# Patient Record
Sex: Male | Born: 1950 | Race: White | Hispanic: No | Smoking: Current every day smoker
Health system: Southern US, Community
[De-identification: ages and names within clinical notes are randomized; demographics above are authoritative.]

## PROBLEM LIST (undated history)

## (undated) DIAGNOSIS — K2100 Gastro-esophageal reflux disease with esophagitis, without bleeding: Secondary | ICD-10-CM

## (undated) DIAGNOSIS — G9389 Other specified disorders of brain: Secondary | ICD-10-CM

## (undated) DIAGNOSIS — K5090 Crohn's disease, unspecified, without complications: Secondary | ICD-10-CM

## (undated) DIAGNOSIS — R079 Chest pain, unspecified: Secondary | ICD-10-CM

## (undated) DIAGNOSIS — Z8719 Personal history of other diseases of the digestive system: Secondary | ICD-10-CM

## (undated) DIAGNOSIS — M51369 Other intervertebral disc degeneration, lumbar region without mention of lumbar back pain or lower extremity pain: Secondary | ICD-10-CM

## (undated) DIAGNOSIS — K3189 Other diseases of stomach and duodenum: Secondary | ICD-10-CM

## (undated) DIAGNOSIS — M549 Dorsalgia, unspecified: Secondary | ICD-10-CM

## (undated) DIAGNOSIS — G4733 Obstructive sleep apnea (adult) (pediatric): Secondary | ICD-10-CM

## (undated) DIAGNOSIS — R0602 Shortness of breath: Secondary | ICD-10-CM

## (undated) DIAGNOSIS — M5030 Other cervical disc degeneration, unspecified cervical region: Secondary | ICD-10-CM

## (undated) DIAGNOSIS — K7010 Alcoholic hepatitis without ascites: Secondary | ICD-10-CM

## (undated) DIAGNOSIS — E569 Vitamin deficiency, unspecified: Secondary | ICD-10-CM

## (undated) DIAGNOSIS — F1020 Alcohol dependence, uncomplicated: Secondary | ICD-10-CM

## (undated) DIAGNOSIS — Z87442 Personal history of urinary calculi: Secondary | ICD-10-CM

## (undated) DIAGNOSIS — G43909 Migraine, unspecified, not intractable, without status migrainosus: Secondary | ICD-10-CM

## (undated) DIAGNOSIS — K449 Diaphragmatic hernia without obstruction or gangrene: Secondary | ICD-10-CM

## (undated) DIAGNOSIS — K766 Portal hypertension: Secondary | ICD-10-CM

## (undated) DIAGNOSIS — S065XAA Traumatic subdural hemorrhage with loss of consciousness status unknown, initial encounter: Secondary | ICD-10-CM

## (undated) DIAGNOSIS — K219 Gastro-esophageal reflux disease without esophagitis: Secondary | ICD-10-CM

## (undated) DIAGNOSIS — M5136 Other intervertebral disc degeneration, lumbar region: Secondary | ICD-10-CM

## (undated) DIAGNOSIS — K7030 Alcoholic cirrhosis of liver without ascites: Secondary | ICD-10-CM

## (undated) DIAGNOSIS — K529 Noninfective gastroenteritis and colitis, unspecified: Secondary | ICD-10-CM

## (undated) DIAGNOSIS — I639 Cerebral infarction, unspecified: Secondary | ICD-10-CM

## (undated) DIAGNOSIS — R011 Cardiac murmur, unspecified: Secondary | ICD-10-CM

## (undated) DIAGNOSIS — K911 Postgastric surgery syndromes: Secondary | ICD-10-CM

## (undated) DIAGNOSIS — S065X9A Traumatic subdural hemorrhage with loss of consciousness of unspecified duration, initial encounter: Secondary | ICD-10-CM

## (undated) DIAGNOSIS — K909 Intestinal malabsorption, unspecified: Secondary | ICD-10-CM

## (undated) DIAGNOSIS — G8929 Other chronic pain: Secondary | ICD-10-CM

## (undated) DIAGNOSIS — F32A Depression, unspecified: Secondary | ICD-10-CM

## (undated) DIAGNOSIS — K3184 Gastroparesis: Secondary | ICD-10-CM

## (undated) DIAGNOSIS — J690 Pneumonitis due to inhalation of food and vomit: Secondary | ICD-10-CM

## (undated) DIAGNOSIS — F329 Major depressive disorder, single episode, unspecified: Secondary | ICD-10-CM

## (undated) DIAGNOSIS — M1990 Unspecified osteoarthritis, unspecified site: Secondary | ICD-10-CM

## (undated) DIAGNOSIS — G459 Transient cerebral ischemic attack, unspecified: Secondary | ICD-10-CM

## (undated) DIAGNOSIS — K760 Fatty (change of) liver, not elsewhere classified: Secondary | ICD-10-CM

## (undated) DIAGNOSIS — K852 Alcohol induced acute pancreatitis without necrosis or infection: Secondary | ICD-10-CM

## (undated) DIAGNOSIS — E291 Testicular hypofunction: Secondary | ICD-10-CM

## (undated) DIAGNOSIS — D509 Iron deficiency anemia, unspecified: Secondary | ICD-10-CM

## (undated) DIAGNOSIS — F419 Anxiety disorder, unspecified: Secondary | ICD-10-CM

## (undated) DIAGNOSIS — I10 Essential (primary) hypertension: Secondary | ICD-10-CM

## (undated) HISTORY — DX: Alcohol dependence, uncomplicated: F10.20

## (undated) HISTORY — DX: Intestinal malabsorption, unspecified: K90.9

## (undated) HISTORY — DX: Gastro-esophageal reflux disease with esophagitis: K21.0

## (undated) HISTORY — DX: Other intervertebral disc degeneration, lumbar region: M51.36

## (undated) HISTORY — PX: BILROTH II PROCEDURE: SHX1232

## (undated) HISTORY — DX: Major depressive disorder, single episode, unspecified: F32.9

## (undated) HISTORY — DX: Postgastric surgery syndromes: K91.1

## (undated) HISTORY — DX: Personal history of other diseases of the digestive system: Z87.19

## (undated) HISTORY — DX: Alcoholic cirrhosis of liver without ascites: K70.30

## (undated) HISTORY — DX: Alcoholic hepatitis without ascites: K70.10

## (undated) HISTORY — DX: Other cervical disc degeneration, unspecified cervical region: M50.30

## (undated) HISTORY — DX: Depression, unspecified: F32.A

## (undated) HISTORY — DX: Other specified disorders of brain: G93.89

## (undated) HISTORY — DX: Iron deficiency anemia, unspecified: D50.9

## (undated) HISTORY — DX: Other intervertebral disc degeneration, lumbar region without mention of lumbar back pain or lower extremity pain: M51.369

## (undated) HISTORY — DX: Gastro-esophageal reflux disease without esophagitis: K21.9

## (undated) HISTORY — DX: Portal hypertension: K76.6

## (undated) HISTORY — PX: TRANSTHORACIC ECHOCARDIOGRAM: SHX275

## (undated) HISTORY — DX: Gastro-esophageal reflux disease with esophagitis, without bleeding: K21.00

## (undated) HISTORY — DX: Cardiac murmur, unspecified: R01.1

## (undated) HISTORY — DX: Pneumonitis due to inhalation of food and vomit: J69.0

## (undated) HISTORY — DX: Fatty (change of) liver, not elsewhere classified: K76.0

## (undated) HISTORY — DX: Anxiety disorder, unspecified: F41.9

## (undated) HISTORY — DX: Alcohol induced acute pancreatitis without necrosis or infection: K85.20

## (undated) HISTORY — PX: CARDIOVASCULAR STRESS TEST: SHX262

## (undated) HISTORY — DX: Other diseases of stomach and duodenum: K31.89

## (undated) HISTORY — DX: Testicular hypofunction: E29.1

## (undated) HISTORY — DX: Vitamin deficiency, unspecified: E56.9

## (undated) HISTORY — PX: COLON RESECTION: SHX5231

## (undated) HISTORY — PX: NISSEN FUNDOPLICATION: SHX2091

## (undated) HISTORY — DX: Chest pain, unspecified: R07.9

## (undated) HISTORY — DX: Obstructive sleep apnea (adult) (pediatric): G47.33

---

## 2000-12-16 ENCOUNTER — Emergency Department (HOSPITAL_COMMUNITY): Admission: EM | Admit: 2000-12-16 | Discharge: 2000-12-16 | Payer: Self-pay | Admitting: Emergency Medicine

## 2000-12-18 ENCOUNTER — Encounter: Payer: Self-pay | Admitting: Pediatrics

## 2000-12-18 ENCOUNTER — Ambulatory Visit (HOSPITAL_COMMUNITY): Admission: RE | Admit: 2000-12-18 | Discharge: 2000-12-18 | Payer: Self-pay | Admitting: Pediatrics

## 2003-07-25 ENCOUNTER — Emergency Department (HOSPITAL_COMMUNITY): Admission: EM | Admit: 2003-07-25 | Discharge: 2003-07-25 | Payer: Self-pay | Admitting: Emergency Medicine

## 2003-07-28 ENCOUNTER — Ambulatory Visit (HOSPITAL_COMMUNITY): Admission: RE | Admit: 2003-07-28 | Discharge: 2003-07-28 | Payer: Self-pay | Admitting: Family Medicine

## 2003-07-29 ENCOUNTER — Observation Stay (HOSPITAL_COMMUNITY): Admission: AD | Admit: 2003-07-29 | Discharge: 2003-07-30 | Payer: Self-pay | Admitting: Family Medicine

## 2003-08-26 ENCOUNTER — Ambulatory Visit (HOSPITAL_COMMUNITY): Admission: RE | Admit: 2003-08-26 | Discharge: 2003-08-26 | Payer: Self-pay | Admitting: Internal Medicine

## 2004-09-06 ENCOUNTER — Ambulatory Visit (HOSPITAL_COMMUNITY): Admission: RE | Admit: 2004-09-06 | Discharge: 2004-09-06 | Payer: Self-pay | Admitting: Family Medicine

## 2005-01-26 ENCOUNTER — Ambulatory Visit (HOSPITAL_COMMUNITY): Admission: RE | Admit: 2005-01-26 | Discharge: 2005-01-26 | Payer: Self-pay | Admitting: Pediatrics

## 2005-02-03 ENCOUNTER — Ambulatory Visit (HOSPITAL_COMMUNITY): Admission: RE | Admit: 2005-02-03 | Discharge: 2005-02-03 | Payer: Self-pay | Admitting: Pediatrics

## 2005-04-13 ENCOUNTER — Ambulatory Visit: Payer: Self-pay | Admitting: Internal Medicine

## 2005-04-24 ENCOUNTER — Encounter (INDEPENDENT_AMBULATORY_CARE_PROVIDER_SITE_OTHER): Payer: Self-pay | Admitting: Internal Medicine

## 2005-04-25 ENCOUNTER — Ambulatory Visit (HOSPITAL_COMMUNITY): Admission: RE | Admit: 2005-04-25 | Discharge: 2005-04-25 | Payer: Self-pay | Admitting: Internal Medicine

## 2005-04-25 ENCOUNTER — Ambulatory Visit: Payer: Self-pay | Admitting: Internal Medicine

## 2005-06-22 ENCOUNTER — Encounter: Admission: RE | Admit: 2005-06-22 | Discharge: 2005-07-07 | Payer: Self-pay | Admitting: Family Medicine

## 2005-07-13 ENCOUNTER — Encounter: Admission: RE | Admit: 2005-07-13 | Discharge: 2005-10-11 | Payer: Self-pay | Admitting: Family Medicine

## 2005-09-19 ENCOUNTER — Inpatient Hospital Stay (HOSPITAL_COMMUNITY): Admission: EM | Admit: 2005-09-19 | Discharge: 2005-09-24 | Payer: Self-pay | Admitting: Emergency Medicine

## 2005-09-20 ENCOUNTER — Ambulatory Visit: Payer: Self-pay | Admitting: Internal Medicine

## 2005-10-12 ENCOUNTER — Ambulatory Visit (HOSPITAL_COMMUNITY): Admission: RE | Admit: 2005-10-12 | Discharge: 2005-10-12 | Payer: Self-pay | Admitting: Pediatrics

## 2005-10-12 ENCOUNTER — Ambulatory Visit: Payer: Self-pay | Admitting: Cardiology

## 2005-11-22 ENCOUNTER — Ambulatory Visit: Payer: Self-pay | Admitting: Internal Medicine

## 2006-02-19 ENCOUNTER — Ambulatory Visit (HOSPITAL_COMMUNITY): Admission: RE | Admit: 2006-02-19 | Discharge: 2006-02-20 | Payer: Self-pay | Admitting: Family Medicine

## 2006-02-21 ENCOUNTER — Ambulatory Visit: Payer: Self-pay | Admitting: *Deleted

## 2006-02-23 ENCOUNTER — Ambulatory Visit (HOSPITAL_COMMUNITY): Admission: RE | Admit: 2006-02-23 | Discharge: 2006-02-23 | Payer: Self-pay | Admitting: Family Medicine

## 2006-04-29 ENCOUNTER — Ambulatory Visit: Payer: Self-pay | Admitting: Psychiatry

## 2006-04-29 ENCOUNTER — Emergency Department (HOSPITAL_COMMUNITY): Admission: EM | Admit: 2006-04-29 | Discharge: 2006-04-30 | Payer: Self-pay | Admitting: Emergency Medicine

## 2006-04-30 ENCOUNTER — Inpatient Hospital Stay (HOSPITAL_COMMUNITY): Admission: EM | Admit: 2006-04-30 | Discharge: 2006-05-04 | Payer: Self-pay | Admitting: Psychiatry

## 2006-10-08 ENCOUNTER — Ambulatory Visit (HOSPITAL_COMMUNITY): Admission: RE | Admit: 2006-10-08 | Discharge: 2006-10-08 | Payer: Self-pay | Admitting: Family Medicine

## 2007-05-15 ENCOUNTER — Ambulatory Visit: Payer: Self-pay | Admitting: Critical Care Medicine

## 2007-05-20 ENCOUNTER — Ambulatory Visit: Admission: RE | Admit: 2007-05-20 | Discharge: 2007-05-20 | Payer: Self-pay | Admitting: Critical Care Medicine

## 2007-05-20 ENCOUNTER — Ambulatory Visit: Payer: Self-pay | Admitting: Critical Care Medicine

## 2007-05-20 ENCOUNTER — Encounter: Payer: Self-pay | Admitting: Critical Care Medicine

## 2007-06-07 ENCOUNTER — Ambulatory Visit: Payer: Self-pay | Admitting: Critical Care Medicine

## 2007-06-07 DIAGNOSIS — Z8719 Personal history of other diseases of the digestive system: Secondary | ICD-10-CM | POA: Insufficient documentation

## 2007-06-07 DIAGNOSIS — J441 Chronic obstructive pulmonary disease with (acute) exacerbation: Secondary | ICD-10-CM

## 2007-06-07 DIAGNOSIS — I6350 Cerebral infarction due to unspecified occlusion or stenosis of unspecified cerebral artery: Secondary | ICD-10-CM | POA: Insufficient documentation

## 2007-06-07 DIAGNOSIS — J690 Pneumonitis due to inhalation of food and vomit: Secondary | ICD-10-CM

## 2007-06-07 DIAGNOSIS — Z9889 Other specified postprocedural states: Secondary | ICD-10-CM

## 2007-06-07 DIAGNOSIS — K219 Gastro-esophageal reflux disease without esophagitis: Secondary | ICD-10-CM

## 2007-06-07 DIAGNOSIS — F411 Generalized anxiety disorder: Secondary | ICD-10-CM | POA: Insufficient documentation

## 2007-06-11 ENCOUNTER — Ambulatory Visit: Payer: Self-pay | Admitting: Gastroenterology

## 2007-06-18 ENCOUNTER — Ambulatory Visit (HOSPITAL_COMMUNITY): Admission: RE | Admit: 2007-06-18 | Discharge: 2007-06-18 | Payer: Self-pay | Admitting: Gastroenterology

## 2007-06-24 ENCOUNTER — Encounter: Payer: Self-pay | Admitting: Gastroenterology

## 2007-06-24 ENCOUNTER — Ambulatory Visit: Payer: Self-pay | Admitting: Gastroenterology

## 2007-07-04 ENCOUNTER — Ambulatory Visit (HOSPITAL_COMMUNITY): Admission: RE | Admit: 2007-07-04 | Discharge: 2007-07-04 | Payer: Self-pay | Admitting: Gastroenterology

## 2007-07-04 ENCOUNTER — Encounter: Payer: Self-pay | Admitting: Gastroenterology

## 2007-07-29 ENCOUNTER — Ambulatory Visit: Payer: Self-pay | Admitting: Gastroenterology

## 2007-08-06 ENCOUNTER — Ambulatory Visit: Payer: Self-pay | Admitting: Cardiology

## 2007-08-07 ENCOUNTER — Ambulatory Visit (HOSPITAL_COMMUNITY): Admission: RE | Admit: 2007-08-07 | Discharge: 2007-08-07 | Payer: Self-pay | Admitting: Surgery

## 2007-08-13 ENCOUNTER — Ambulatory Visit (HOSPITAL_COMMUNITY): Admission: RE | Admit: 2007-08-13 | Discharge: 2007-08-13 | Payer: Self-pay | Admitting: Gastroenterology

## 2007-08-29 HISTORY — PX: PYLOROPLASTY: SHX418

## 2007-09-29 HISTORY — PX: HIATAL HERNIA REPAIR: SHX195

## 2007-10-24 ENCOUNTER — Inpatient Hospital Stay (HOSPITAL_COMMUNITY): Admission: AD | Admit: 2007-10-24 | Discharge: 2007-12-12 | Payer: Self-pay | Admitting: Surgery

## 2007-12-23 ENCOUNTER — Inpatient Hospital Stay (HOSPITAL_COMMUNITY): Admission: EM | Admit: 2007-12-23 | Discharge: 2008-01-07 | Payer: Self-pay | Admitting: Emergency Medicine

## 2007-12-26 ENCOUNTER — Ambulatory Visit: Payer: Self-pay | Admitting: Internal Medicine

## 2008-01-25 ENCOUNTER — Emergency Department (HOSPITAL_COMMUNITY): Admission: EM | Admit: 2008-01-25 | Discharge: 2008-01-25 | Payer: Self-pay | Admitting: Emergency Medicine

## 2008-01-30 ENCOUNTER — Encounter: Payer: Self-pay | Admitting: Internal Medicine

## 2008-01-30 ENCOUNTER — Encounter: Payer: Self-pay | Admitting: Gastroenterology

## 2008-09-03 ENCOUNTER — Ambulatory Visit (HOSPITAL_COMMUNITY): Admission: RE | Admit: 2008-09-03 | Discharge: 2008-09-03 | Payer: Self-pay | Admitting: Family Medicine

## 2009-01-20 IMAGING — CT CT ABCESS DRAINAGE
1 of 20 series · 2 of 32 positions shown, 5 images · non-contrast
Comparison: CT of the abdomen 10/31/07.

CLINICAL DATA: Status post re-do of Noomie fundoplication and pyloroplasty for abnormal gastric emptying.  The patient has developed separate postoperative fluid collections around the liver and has a rising white blood cell count.  Request has been made to drain an infrahepatic collection as well as a subphrenic perihepatic collection.  
 1.  CT GUIDED PERCUTANEOUS DRAINAGE OF INFRAHEPATIC ABSCESS:
 2.  CT GUIDED PERCUTANEOUS DRAINAGE OF SUBPHRENIC/PERIHEPATIC ABSCESS 11/01/07:

[Series 2: abd 5.0 b40f st · axial · 0.79mm/px · z∈[-343,-273]mm · 2 of 44 slices shown, 5 images]
[im 15/44  soft-tissue]
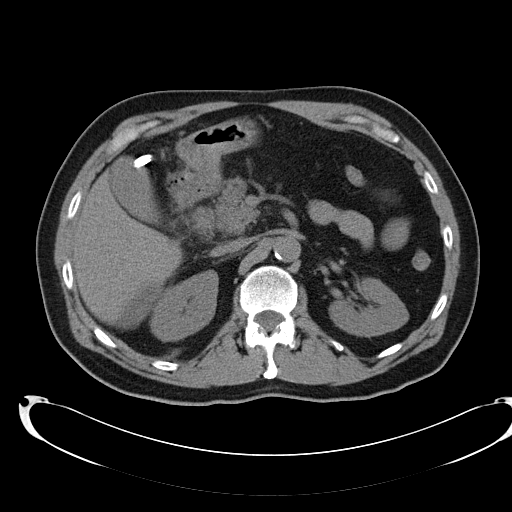
[im 15/44  lung]
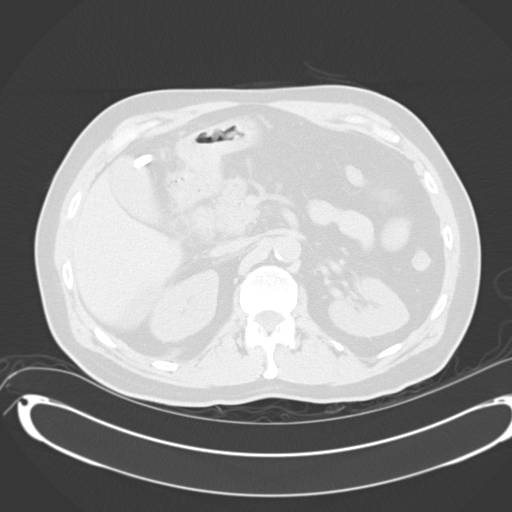
[im 15/44  bone]
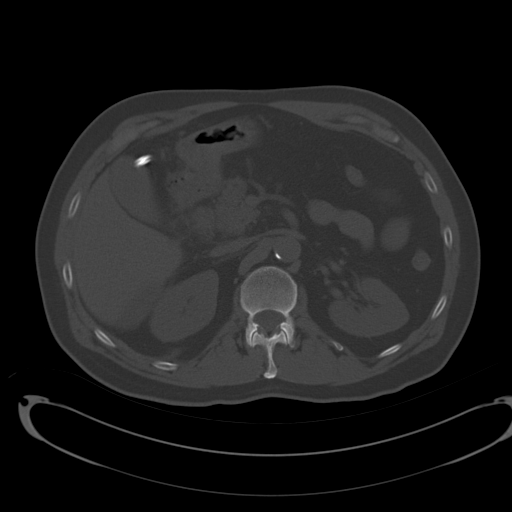
[im 29/44  soft-tissue]
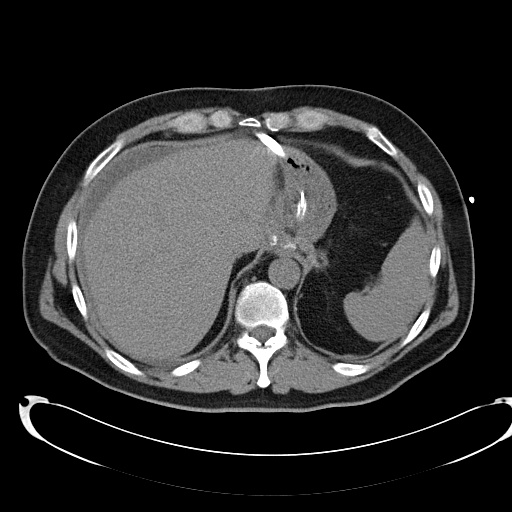
[im 29/44  lung]
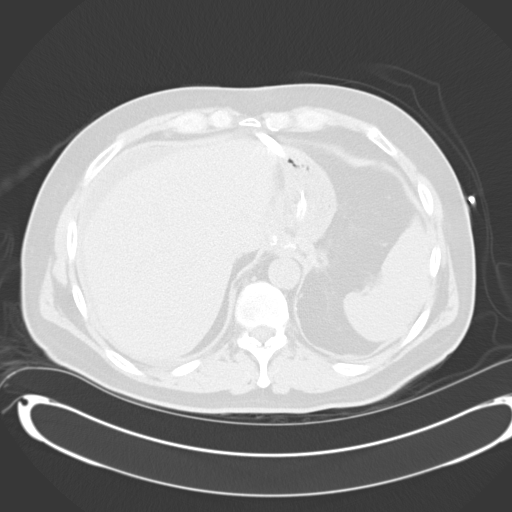

[2 of 32 positions shown; findings below may reference images not displayed]

Prior to the procedure consent was obtained from the patient.  
 Sedation:  6 mg IV Versed, 75 mcg IV fentanyl. 
 Total moderate sedation time:  50 minutes. 
 Initial supine imaging was performed of the abdomen.  The patient was then rolled slightly with the right side elevated.  After localizing separate sites for abscess drainage procedures the skin was sterilely prepped and draped.  Local anesthesia was provided with 1% lidocaine.  
 RIGHT INFRAHEPATIC ABSCESS DRAINAGE:
 An 18-gauge needle was advanced into the focal collection located just inferior to the tip of the liver.  Aspiration was performed.  A sample of fluid was collected and sent for culture studies.  A guidewire was advanced into the collection.  The tract was dilated to 12 French.  A 12 French percutaneous pigtail drainage catheter was then placed.  This was then positioned with additional CT and flushed.  The tube was then connected to Kyoungwoo Vilar Rodrigues suction bulb.  It was secured at the skin with a Prolene retention suture and adhesive retention device. 
 RIGHT SUBPHRENIC PERIHEPATIC ABSCESS DRAINAGE:
 From a lateral approach, an 18-gauge needle was advanced into the anterior perihepatic space in a subphrenic location.  Aspiration of fluid was performed and a sample sent for culture.  A guidewire was advanced into the collection.  The tract was dilated to 12 French.  A 12 French pigtail drain was then advanced into the collection and formed.  The catheter was flushed with saline and connected to Kyoungwoo Vilar Rodrigues suction bulb.  This was secured at the skin with a Prolene retention suture and adhesive retention device. 
 Complications:  None.
FINDINGS: Aspiration of the infrahepatic collection yielded turbid yellowish fluid containing debris.  The drain was able to be positioned in the collection. 
 Aspiration of the perihepatic and subphrenic collection yielded similar turbid fluid with debris.  A 12 French drain was positioned in the anterior upper perihepatic space.  Output from both catheters will be followed.
IMPRESSION: Separate percutaneous abscess drainage procedures of an infrahepatic abscess as well as a subphrenic perihepatic abscess.  Similar turbid fluid was aspirated from both collections and sent for culture studies.  Both collections were drained with placement of 12 French percutaneous drains.  Output will be followed.

## 2009-01-20 IMAGING — CR DG ABDOMEN 1V
2 series · 2 of 2 positions shown · non-contrast
Comparison: CT 10/31/07.

CLINICAL DATA: Follow-up GERD.  Gastric distention. 
 ABDOMEN ? 1 VIEW:

[t abdomen supine (1 of 2)]
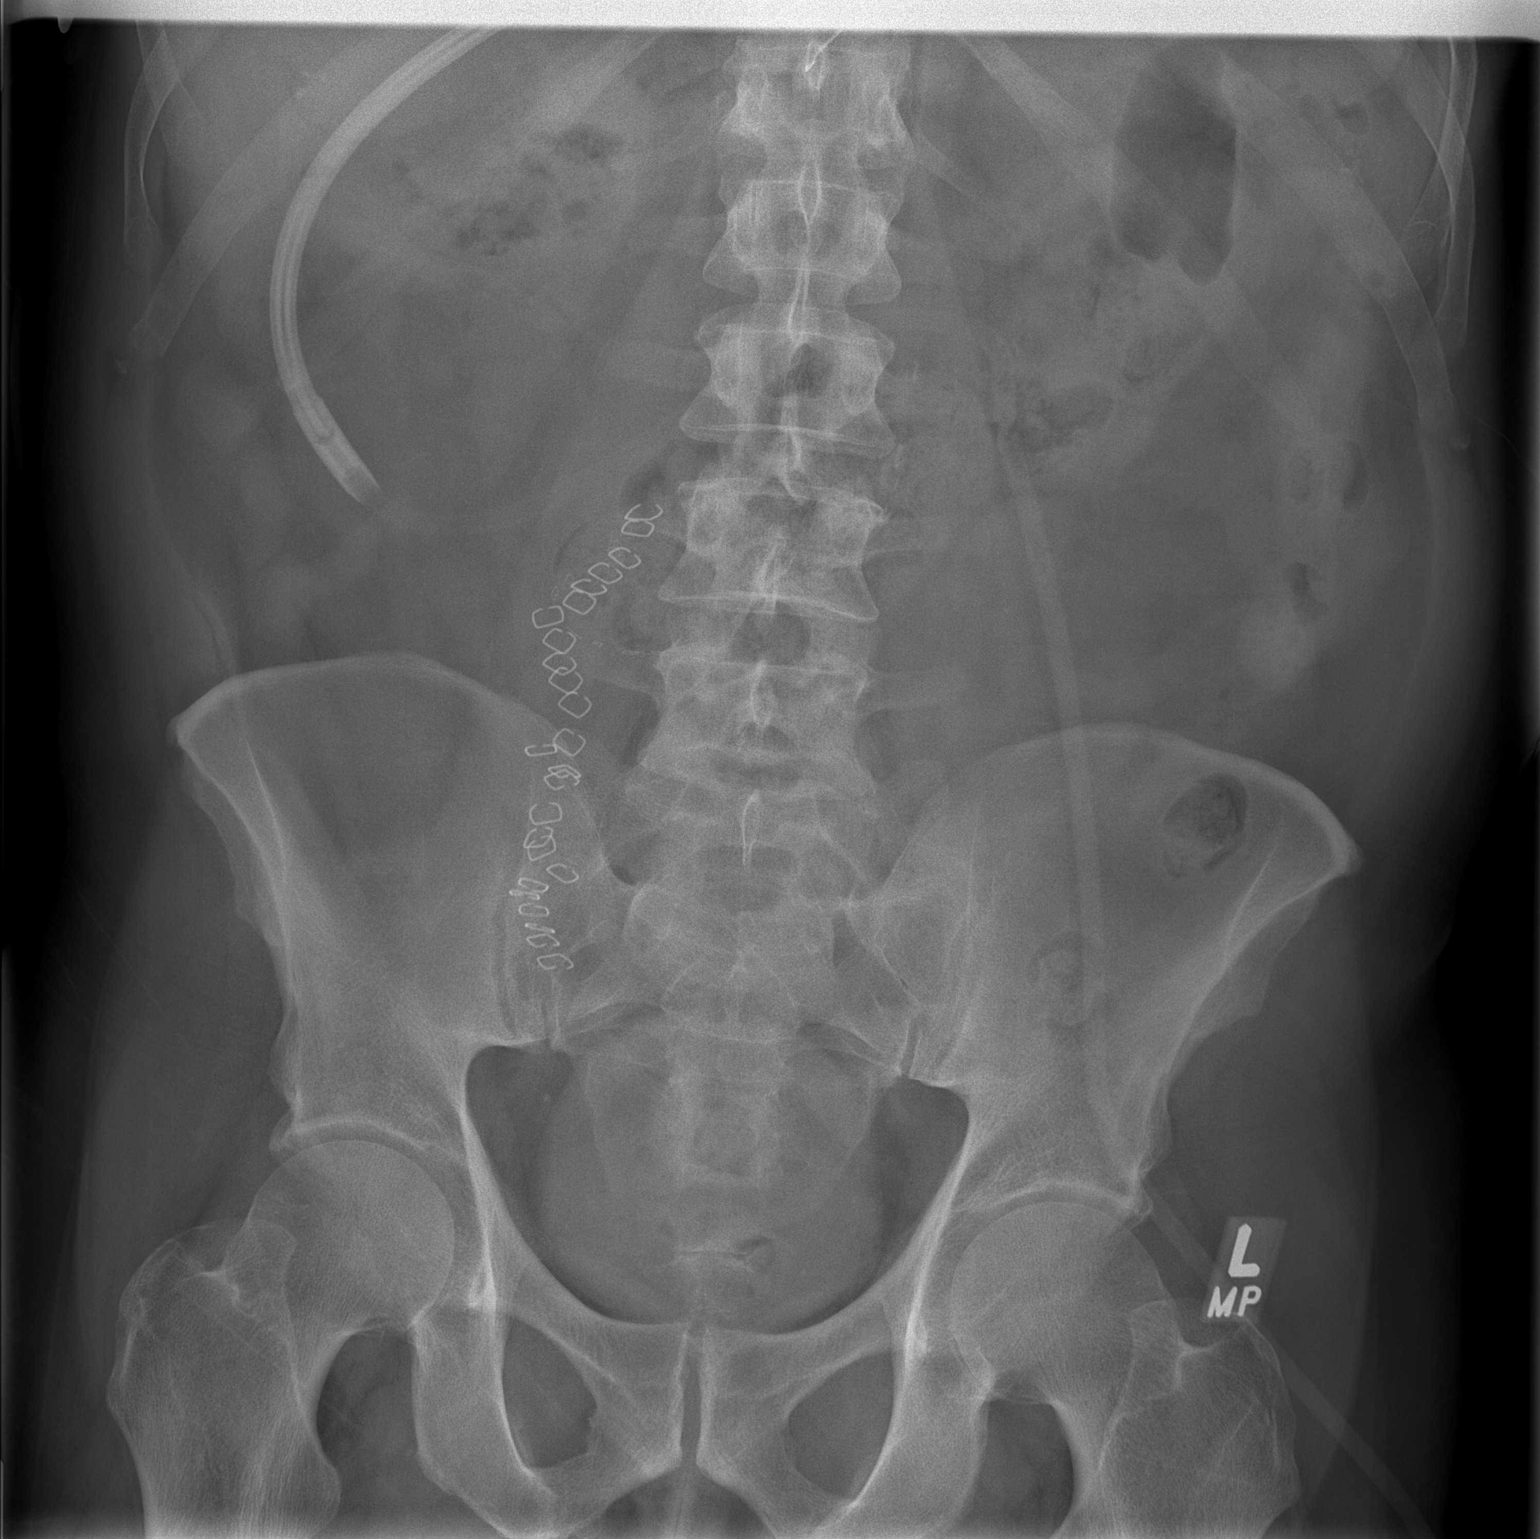

[t abdomen supine (2 of 2)]
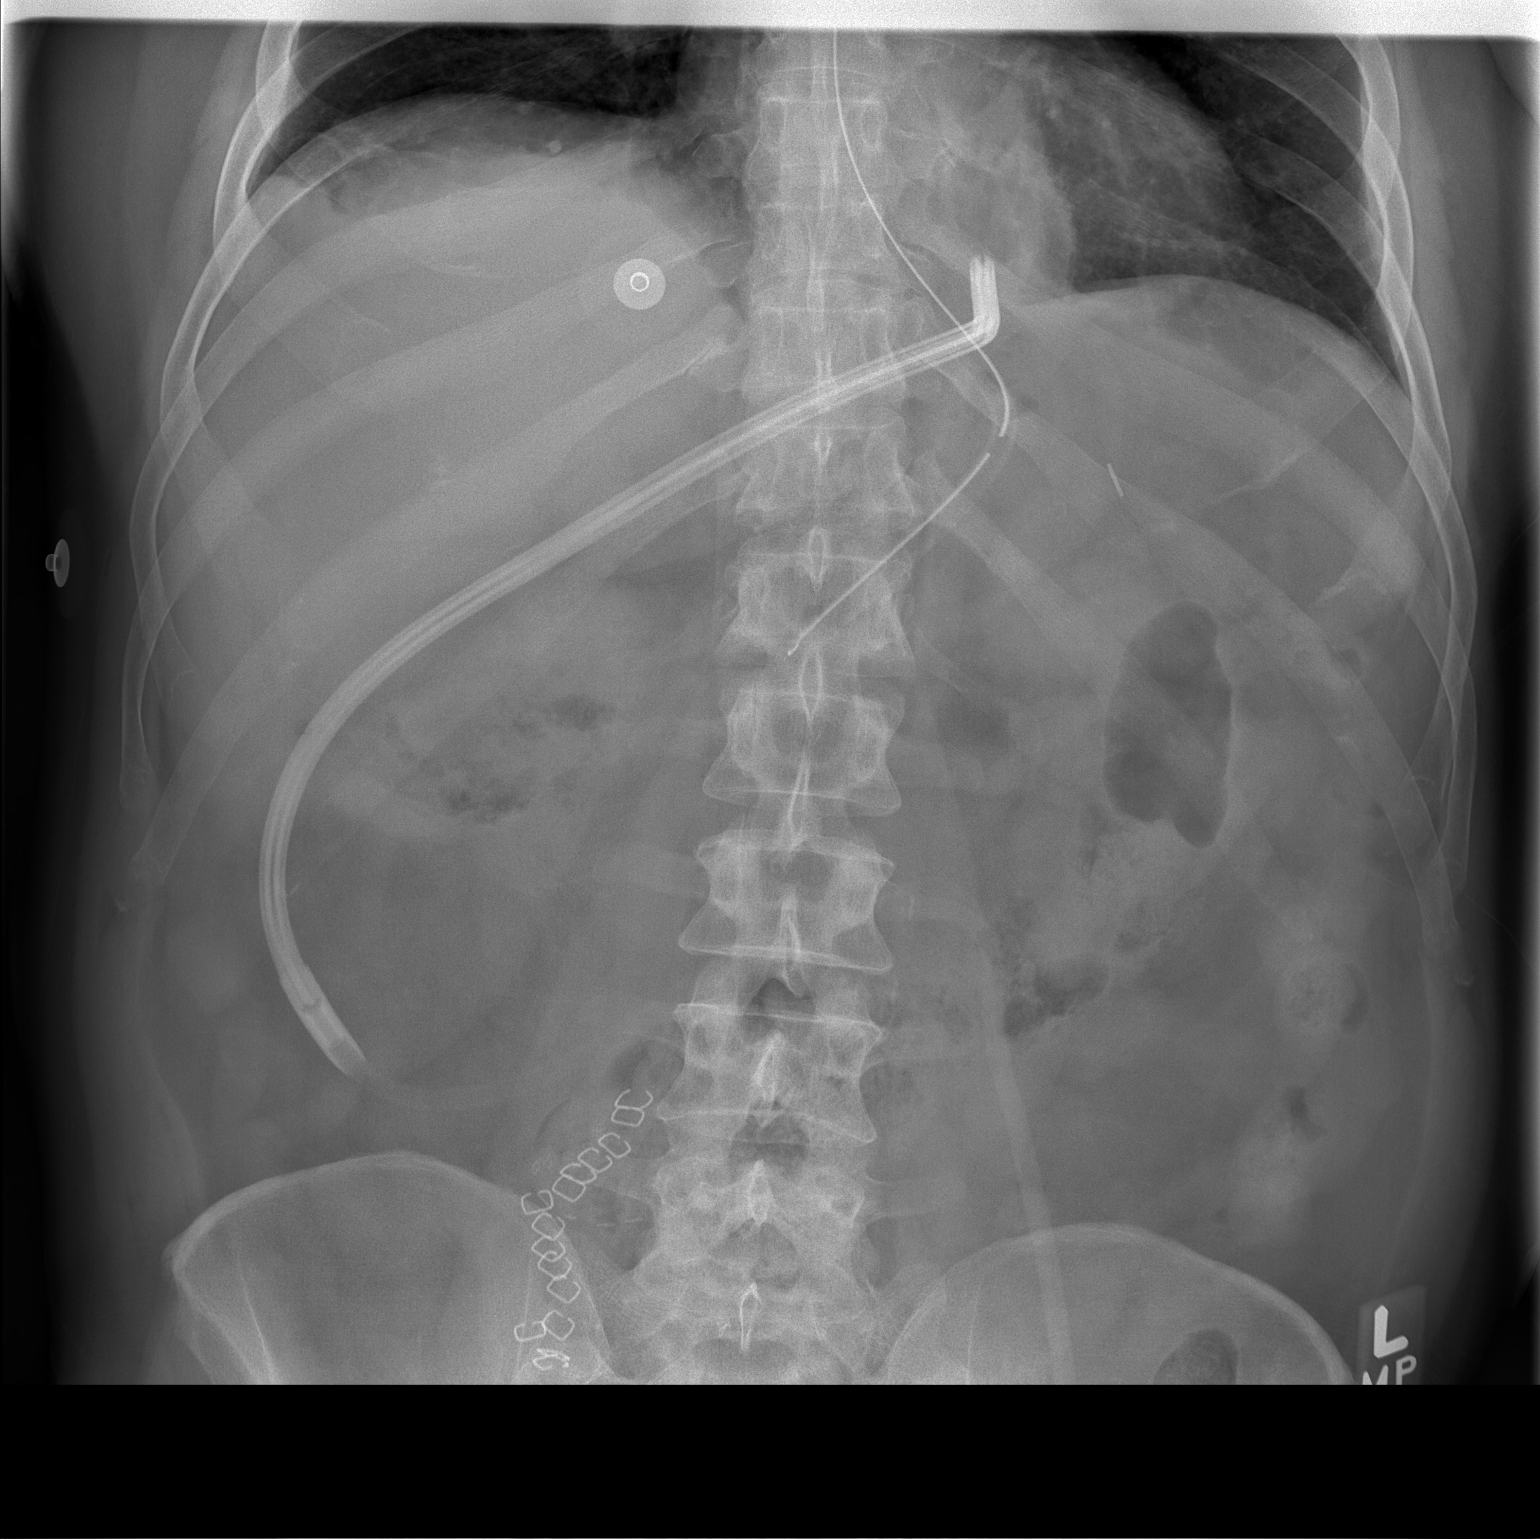

[2 of 2 positions shown; findings below may reference images not displayed]

FINDINGS: Nasogastric tube has its tip in the mid stomach.  The drain along the ventral surface of the liver remains in place.   The bowel gas pattern does not show ileus, obstruction or evidence of free air.
IMPRESSION: No unexpected finding.

## 2009-08-25 ENCOUNTER — Encounter (HOSPITAL_COMMUNITY): Admission: RE | Admit: 2009-08-25 | Discharge: 2009-09-24 | Payer: Self-pay | Admitting: Pediatrics

## 2009-11-23 ENCOUNTER — Ambulatory Visit: Payer: Self-pay | Admitting: Gastroenterology

## 2009-11-23 DIAGNOSIS — K911 Postgastric surgery syndromes: Secondary | ICD-10-CM

## 2009-12-09 ENCOUNTER — Telehealth: Payer: Self-pay | Admitting: Gastroenterology

## 2009-12-31 ENCOUNTER — Encounter: Payer: Self-pay | Admitting: Gastroenterology

## 2010-09-18 ENCOUNTER — Encounter: Payer: Self-pay | Admitting: Internal Medicine

## 2010-09-27 NOTE — Consult Note (Signed)
Summary: [facility]   Imported By: Phillis Knack 01/25/2010 09:01:20  _____________________________________________________________________  External Attachment:    Type:   Image     Comment:   External Document

## 2010-09-27 NOTE — Progress Notes (Signed)
Summary: Triage   Phone Note Call from Patient Call back at Home Phone [phone]   Caller: Patient Call For: Dr. Ardis Hughs Reason for Call: Talk to Nurse Summary of Call: Pt is calling about his referral appt. to Dr. Derrill Kay at [facility]. Wants to know if it has been sch'd Initial call taken by: Webb Laws,  December 09, 2009 4:42 PM  Follow-up for Phone Call        called and gave pt the date and time of appt and [facility] will send out a packet Follow-up by: Christian Mate CMA Deborra Medina),  December 09, 2009 4:48 PM

## 2010-09-27 NOTE — Assessment & Plan Note (Signed)
Review of gastrointestinal problems: 1. Multiple gastric surgeries: underwent fundoplication In [facility] several years ago for a "giant hiatal hernia."  probably had second fundoplication surgery very shortly after the first.  Underwent second redo fundoplication with Dr. Johney Maine 2008 in [place]. Underwent gastrojejunostomy 2 months following his 3875 fundoplication surgery.     History of Present Illness Visit Type: Initial Consult Primary GI MD: Owens Loffler MD Primary Provider: Berniece Andreas Requesting Provider: Berniece Andreas Chief Complaint: Discuss Crohns History of Present Illness:     underwent 2nd Redo fundoplication, this was followed by gastrojejunostomy about 2 months after the fundoplication. He was hospitalized for a total of about 2 months during this time. For a brief period he had jejunostomy feeding tube as well as gastric venting tube.  Since that care of surgeries he has had significant postprandial symptoms.  He feels full all the time, nausea, somtimes vomitting after eating, sweating, palpitations. This happens with every meal.  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  He has tried separating his liquids, solids without much improvment.  He was having diarrhea following meals, but that eased and he has not really bothered by diarrhea anymore  He lost about 40 pounds immediately after surgery, has put most of it back on, but not all.  (up 35 pounds).           Current Medications (verified): 1)  Zegerid 40-1100 Mg  Caps (Omeprazole-Sodium Bicarbonate) .Marland Kitchen.. 1 By Mouth Once Daily 2)  Ambien 10 Mg Tabs (Zolpidem Tartrate) .Marland Kitchen.. 1 By Mouth At Bedtime 3)  Multivitamins  Tabs (Multiple Vitamin) .Marland Kitchen.. 1 By Mouth Once Daily  Allergies (verified): No Known Drug Allergies  Vital Signs:  Patient profile:   60 year old male Height:      72 inches Weight:      196 pounds BMI:     26.68 BSA:     2.11 Pulse rate:   100 / minute Pulse  rhythm:   regular BP sitting:   132 / 78  (left arm)  Vitals Entered By: [place] Deborra Medina) (November 23, 2009 1:57 PM)  Physical Exam  Additional Exam:  Constitutional: generally well appearing Psychiatric: alert and oriented times 3 Abdomen: soft, non-tender, non-distended, normal bowel sounds: Several abdominal scars, laparoscopic as well as a midline scar well-healed    Impression & Recommendations:  Problem # 1:  multiple gastric surgeries, likely dumping syndrome he has flushing, sweating, palpitations, bloating, nausea, intermittent vomiting with every meal. He has tried modifying his diet, but this has not helped. He was not very happy with his surgical care here [place].  I do think that he probably has dumping syndrome resulting from his gastric surgeries, probably a gastrojejunostomy portion. I reviewed again dumping syndrome symptoms, diet recommendations and gave him literature on this. Since he has tried many of these recommendations at various times over the past couple years I do suspect that this will not be successful however. We will arrange for evaluation it week [facility] with Dr. Derrill Kay as well.  Patient Instructions: 1)  Was given literature on Dumping Syndrome, try your best to adhere to the diet. 2)  We will refer you to Dr. Derrill Kay at [facility]. 3)  A copy of this information will be sent to [name]. 4)  The medication list was reviewed and reconciled.  All changed / newly prescribed medications were explained.  A complete medication list was provided to the patient /  caregiver.  Appended Document: Orders Update/DR [facility]    Clinical Lists Changes  Problems: Added new problem of DUMPING SYNDROME (ICD-564.2) Orders: Added new Test order of Misc. Referral (Misc. Ref) - Signed

## 2010-09-27 NOTE — Procedures (Signed)
Summary: EGD   EGD  Procedure date:  07/04/2007  Findings:      Location: [facility]    EGD  Procedure date:  07/04/2007  Findings:      Location: [facility]   Patient Name: Gary Vasquez, Gary Vasquez MRN: [id number] Procedure Procedures: Panendoscopy (EGD) CPT: 20254. placement of Bravo 48hour wireless pH probe.  Personnel: Endoscopist: Milus Banister, MD.  Exam Location: Exam performed in Endoscopy Suite. Outpatient  Patient Consent: Procedure, Alternatives, Risks and Benefits discussed, consent obtained, from patient. Consent was obtained by the RN.  Indications Symptoms: Reflux symptoms  Comments: consideration for redo Nissen.  Patient declined the esophageal manometry that we had scheduled for today. History  Current Medications: Patient is not currently taking Coumadin.  Comments: Patient history reviewed/updated, physical exam performed prior to initiation of sedation? yes Pre-Exam Physical: Performed Jul 04, 2007  Cardio-pulmonary exam, Abdominal exam, Mental status exam WNL.  Comments: Pt. history reviewed/updated, physical exam performed prior to initiation of sedation? yes Exam Exam Info: Maximum depth of insertion Stomach, intended Stomach. Patient position: on left side. Gastric retroflexion performed. Images taken. ASA Classification: II. Tolerance: good.  Sedation Meds: Patient assessed and found to be appropriate for moderate (conscious) sedation. Fentanyl 100 mcg. given IV. Versed 10 mg. given IV.  Monitoring: BP and pulse monitoring done. Oximetry used. Supplemental O2 given  Findings OTHER FINDING: GE junction at 41cm from incisors.  The Bravo 48 hour wireless pH probe was placed in usual fashion at 35cm (6cm above his GE junction).  Probe position was confirmed endoscopically following its placement. in Distal Esophagus.   Assessment Abnormal examination, see findings above.  Comments: Bravo 48 hour wirelss pH probe  was placed 6cm above his GE junction. Events  Unplanned Intervention: No unplanned interventions were required.  Unplanned Events: There were no complications. Plans Comments: HE WILL COMPLETE THIS pH STUDY OFF OF ANTI-ACID MEDICINES.  HE DECLINED THE ESOPHAGEAL MANOMETRY THAT WAS ALSO SCHEDULED FOR TODAY.  WE WILL SEE IF THIS CAN BE DONE PER-ORAL RATHER THAN NASALLY AND RESCEDULE HIM IF POSSIBLE.  Disposition: After procedure patient sent to recovery.  Comments: MY OFFICE WILL RESCHEDULE THE SURGICAL CONSULTATION TO CONSIDER REDO FUNDOPLICATION. This report was created from the original endoscopy report, which was reviewed and signed by the above listed endoscopist.

## 2011-01-10 NOTE — Letter (Signed)
August 06, 2007    Adin Hector, M.D.  [address]  [place], [place] [address]   RE:  TEEJAY, MEADER  MRN:  [id number]  /  DOB:  November 05, 1950   Dear Dr. Johney Maine:   This 60 year old gentleman with no history of cardiac disease and few  cardiovascular risk factors was referred for preoperative evaluation.  Mr. Tumbleson has had severe gastroesophageal reflux disease and underwent a  Nissen fundoplication in 5449.  This procedure was not successful.  Dr.  Johney Maine plans a redo surgery, perhaps performed laparoscopically.   Mr. Allnutt has had no cardiac problems.  He has never previously been  seen by a cardiologist.  He underwent remote stress testing, perhaps 10  years ago, with presumed negative results.  His medical history is  likewise fairly benign.  Other than his problems in the upper GI tract,  there is a questionable remote history of Crohn's disease that was  treated surgically in the 1960's.  He has had no problems since then,  and negative colonoscopy recently.  He has developed recurrent  aspiration pneumonia and has chronic bronchial inflammation, as a result  of his gastroesophageal reflux disease. He was treated for  hyperlipidemia and hypertension in the past.  He has recently had  borderline blood pressures.  No recent lipid profile is available.   ALLERGIES:  No known drug allergies.   CURRENT MEDICATIONS:  1. Zegerid 40 mg twice daily.  2. Wellbutrin 40 mg twice daily.  3. Klonopin 2 mg q.h.s.   SOCIAL HISTORY:  Employed by a medical supply company.  He is relatively  active, including playing some golf.  Divorced, with two children.   FAMILY HISTORY:  Positive for coronary artery disease.  His father  recently underwent a coronary artery bypass graft surgery and also  required concomitant valve replacement.  His mother is alive, but has  suffered a myocardial infarction in the past.  He has two brothers who  are alive and well.   REVIEW OF SYSTEMS:  Notable  for the need for corrective lenses for near  vision.  A history of peptic ulcer.  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  All other systems are reviewed and are negative.   PHYSICAL EXAMINATION:  GENERAL:  A pleasant well-appearing gentleman, in  no acute distress.  VITAL SIGNS:  Weight is 200 pounds, blood pressure 140/95, heart rate 82  and regular, respirations 16.  HEENT:  Normal funduscopic examination.  NECK:  No jugular venous distention.  No carotid bruits.  LUNGS:  Clear.  HEART:  Normal first and second heart sounds.  Fourth heart sound  present.  Normal PMI.  ABDOMEN:  Soft and nontender.  No organomegaly.  No masses.  Normal  bowel sounds.  EXTREMITIES:  Normal distal pulses.  No edema.  NEUROMUSCULAR:  Symmetric strength and tone.  Normal cranial nerves.  PSYCHIATRIC:  Alert and oriented.  Normal affect.  SKIN:  No significant lesions.   Electrocardiogram:  Normal sinus rhythm.  Borderline left atrial  abnormalities, otherwise unremarkable.   IMPRESSION:  1. Mr. Mcglasson has enjoyed generally good health.  He has a few      cardiovascular risk factors, but no significant evidence for active      cardiovascular disease.  In this setting, no additional      preoperative testing or treatment is warranted.   1. Likewise, his medical status has been good, except for his      gastroesophageal  reflux disease.  2. Aspiration pneumonia.  The last episode of this occurred two years      ago.  His surgical risk is age-appropriate.   [facility] will assist as necessary should any problems be  encountered perioperatively.  Thanks so much for sending this nice  gentleman for my assessment.  I will not plan to follow him routinely,  as his medical issues are well-managed by Dr. Roger Kill.  A series of blood  pressure determinations should be obtained, and a decision made  regarding the need for pharmacologic antihypertensive therapy.    Sincerely,      Cristopher Estimable.  Lattie Haw, MD, [facility]  Electronically Signed    RMR/MedQ  DD: 08/06/2007  DT: 08/07/2007  Job #: [id number]

## 2011-01-10 NOTE — Assessment & Plan Note (Signed)
[place]                         GASTROENTEROLOGY OFFICE NOTE   ABID, BOLLA                      MRN:          [id number]  DATE:07/23/2007                            DOB:          1950-10-09    BRAVO PH MONITORING 48-HOUR PH STUDY RESULTS   Bravo probe was placed endoscopically 6 cm above the GE junction in the  usual fashion on 07/04/2007.  Position was confirmed endoscopically.  The  patient completed his wireless pH testing off of all antacid medicines.  The results clearly show that he has pathologic amounts of acid  refluxing onto his esophagus.  His total DeMeester score for the 48  hours was 52.2 and normal is less than 14.72.   This wireless 48-hour pH study clearly shows that Mr. Kyzen Horn has  pathologic amounts of acid refluxing on his esophagus.  I will forward  this note to Dr. Michael Boston at [facility] who is  considering a re-do Nissen fundoplication for Mr. Gerstner.  Of note, Mr.  Postle declined the esophageal manometry that was recommended as  preoperative workup.  When he showed for the manometry, he adamantly  declined placement of the probe.     Milus Banister, MD  Electronically Signed    DPJ/MedQ  DD: 07/23/2007  DT: 07/23/2007  Job #: [id number]   cc:   Adin Hector, MD

## 2011-01-10 NOTE — Assessment & Plan Note (Signed)
[place]                             PULMONARY OFFICE NOTE   NAME:Gary Vasquez, Gary Vasquez                      MRN:          [id number]  DATE:06/07/2007                            DOB:          1950/12/05    Mr. Sudduth is a 60 year old white male here for followup.  Has had  chronic dyspnea and cough for over a year.  His evaluation has  determined that he does indeed have chronic aspiration pneumonia.  Biopsies showed chronic inflammation.  Cultures were negative.   MEDICATIONS:  1. He received a course of prednisone.  2. Symbicort 160/4.5 two sprays b.i.d.  3. He has finished his course of prednisone.  4. He is on Zegerid now 70 mg b.i.d.  5. Aspirin 81 mg daily.  6. Wellbutrin 150 mg b.i.d.  7. Clonazepam 10 mg at bedtime.   Overall, he is markedly improved with decreased shortness of breath,  decreased cough.   EXAM:  Temperature is 97.9, blood pressure 120/84, pulse 89, saturation  95% on room air.  CHEST:  Showed diminished breath sounds with prolonged expiratory phase.  No wheeze or rhonchi noted.  CARDIAC:  Regular rate and rhythm without S3.  Normal S1, S2.  ABDOMEN:  Soft, nontender.  EXTREMITIES:  No edema or clubbing.   Spirometry obtained today shows mild peripheral airflow obstruction with  17% improvement following bronchodilator.  Total lung capacity is at 75%  predicted, diffusion capacity at 87% predicted.   IMPRESSION:  Chronic bronchitis, chronic aspiration, likely underlying  esophageal stricture, reflux disease.   PLAN:  Refer to gastroenterology for evaluation.  Continue Symbicort as  currently dosed, and maintain b.i.d. Zegerid.  We will see the patient  back in followup.     Burnett Harry Joya Gaskins, MD, [facility]  Electronically Signed    PEW/MedQ  DD: 06/07/2007  DT: 06/07/2007  Job #: [id number]

## 2011-01-10 NOTE — Assessment & Plan Note (Signed)
[place] OFFICE NOTE   BRITAIN, SABER                      MRN:          [id number]  DATE:06/11/2007                            DOB:          1951/06/30    REFERRING PHYSICIAN:  Burnett Harry. Joya Gaskins, MD, [facility]   PRIMARY CARE PHYSICIAN:  Tammi Sou, MD   REASON FOR REFERRAL:  Dr. Joya Gaskins asked me to evaluate Mr. Neilan in  consultation regarding recurrent aspiration, GERD.   HISTORY OF PRESENT ILLNESS:  Mr. Koy is a very pleasant 60 year old  man who was told he had a giant hiatal hernia several years ago.  He  had multiple episodes of aspiration pneumonia.  This eventually  culminated in a fundoplication surgery in [place].  The surgery  sounds complex, and he actually had, I believe, a redo surgery just  three days after his initial one.  He thinks that the fundoplication  held for at least six months, but he does believe it slipped.  He tells  me Dr. Laural Golden in [place] did an upper endoscopy for him in 2005 and  that showed that the fundoplication was not intact anymore.  He does  believe that while it was intact for 6-7 months that he had no problems  with reflux symptoms and no aspiration pneumonias.  He recently  presented to Dr. Joya Gaskins for another aspiration pneumonia.  This  aspiration pneumonia followed a reflux event in the middle of the night  where he vomited up fluid and was coughing profusely.  He said usually  this is the pattern for him and three days after that he would start  having fevers and chills.  Chest x-ray confirmed aspiration pneumonia.  He was treated with antibiotics and some steroids for that.  Currently,  he is on Zegerid twice daily, and he says that does control the acid-  like symptoms fairly well.  He does drink 5-6 caffeinated beverages a  day.   He also carries the diagnosis of Crohn's disease.  He had what sounded  like an ileocecectomy 32 years ago.   He has not been on any specific  Crohn's therapies since then.  He moves his bowels very normally 2-3  times a day.  He had a colonoscopy, he tells me, in 2005, with Dr.  Laural Golden who he  believes told him he does have Crohn's disease, but was  not started on any medications for it.   REVIEW OF SYSTEMS:  Notable for stable weight, otherwise, essentially  normal. __________   PAST MEDICAL HISTORY:  1. Hypertension.  2. Elevated cholesterol.  3. History of mild stroke.  4. Sleep disorder .  5. Question of Crohn's disease with a partial colectomy, question      ileocecectomy in the very distant past.  6. Fundoplication five years ago at [facility] as above.   CURRENT MEDICATIONS:  1. Zegerid 40 mg b.i.d.  2. Aspirin.  3. Wellbutrin.  4. Clonazepam.  5. Symbicort.   ALLERGIES:  No known drug allergies .   SOCIAL HISTORY:  Drinks 5-6 caffeinated beverages a day, divorced,  has  two children, nonsmoker, rarely drinks alcohol.  Works in Program[employer], applications.   FAMILY HISTORY:  No colon cancer or colon polyps in family.   PHYSICAL EXAMINATION:  VITAL SIGNS:  Height 6 feet 0 inches, weight 198  pounds, blood pressure 124/80, pulse 72.  CONSTITUTIONAL:  Generally well-appearing.  NEUROLOGICAL:  Alert and oriented x3.  HEENT:  Eyes:  Extraocular movements intact.  Mouth:  Oropharynx moist,  no lesions.  NECK:  Supple.  No lymphadenopathy.  CARDIOVASCULAR:  Heart regular rate and rhythm.  LUNGS:  Clear to auscultation bilaterally.  ABDOMEN:  Soft, nontender, nondistended, normoactive bowel sounds.  EXTREMITIES:  No lower extremity edema.  SKIN:  No rashes or lesions noted in the lower extremities.   ASSESSMENT/PLAN:  A 60 year old man with chronic GERD, recurrent  aspiration pneumonia, questionable history of Crohn's disease.   First, I think we need to evaluate Mr. Kondracki upper GI anatomy.  He was  told he had a giant hiatal hernia in the past.  However, he did undergo  a  fundoplication of some type, and at least temporarily had relief from  his recurrent aspiration pneumonia.  There are reflux symptoms.  We will  get an upper GI to evaluate that.  I have also arranged for him to have  EGD performed to screen him for Barrett's.  He also tells me he has  intermittent dysphagia.  If he has any stricturing disease, I would  certainly be able to dilate at that time.  If, on these tests, it does  show that he has a continued large hiatal hernia, slipped  fundoplication, then I think my recommendation would be for him to have  a redo fundoplication.  Controlling the acid content of regurgitated  stomach contents is only one aspect of good GERD treatment and usually  is significant enough to control most people's symptoms.  However, if he  has significant regurgitation of volume, whether it is acidic or not,  that could continue to cause aspiration problems.  So, I think  tightening his GE junction may be the most reasonable course here.   His history of Crohn's is frankly confusing to me.  I will arrange to  get his recent colonoscopy as well as upper endoscopy reports and office  notes from Dr. Laural Golden for my review.     Milus Banister, MD  Electronically Signed    DPJ/MedQ  DD: 06/11/2007  DT: 06/12/2007  Job #: [id number]   cc:   Burnett Harry. Joya Gaskins, MD, FCCP

## 2011-01-10 NOTE — Op Note (Signed)
NAMELAKEEM, ROZO NO.:  [id number]   MEDICAL RECORD NO.:  [id number]          PATIENT TYPE:  INP   LOCATION:  27                         FACILITY:  [facility]   PHYSICIAN:  Adin Hector, MD     DATE OF BIRTH:  [birth year]   DATE OF PROCEDURE:  12/26/2007  DATE OF DISCHARGE:                               OPERATIVE REPORT   PRIMARY CARE PHYSICIAN:  Not available.   GASTROENTEROLOGIST:  [facility], Gatha Mayer, MD,FACG   SURGEON:  Adin Hector, MD   ASSISTANT:  Not available.   PREOPERATIVE DIAGNOSES:  1. Gastric outlet obstruction secondary to leak and stricture from      pyloroplasty.  2. 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  3. Aspiration pneumonia secondary to recurrent gastroesophageal reflux      disease status post Nissen fundoplication.  4. Severe gastroparesis/delayed gastric emptying status post      pyloroplasty complicated by leak and stricture.   POSTOPERATIVE DIAGNOSES:  1. Gastric outlet obstruction secondary to leak and stricture from      pyloroplasty.  2. 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  3. Aspiration pneumonia secondary to recurrent gastroesophageal reflux      disease status post Nissen fundoplication.  4. Severe gastroparesis/delayed gastric emptying status post      pyloroplasty complicated by leak and stricture.   PROCEDURE PERFORMED:  1. Laparoscopic lysis of adhesions x 120 minutes (it was half the      case).  2. Laparoscopic stapled gastrojejunostomy.  3. Laparoscopic 18-French feeding jejunostomy.   ANESTHESIA:  1. General anesthesia.  2. Local anesthetic in a field block around all port sites.   SPECIMEN:  None.   DRAINS:  19-French Keenan Bachelor drain rests from the right lower quadrant,  drapes over the former pyloroplasty and jejunostomy and left upper  quadrant.   ESTIMATED BLOOD LOSS:  Less than 100 mL.   COMPLICATIONS:  No major complications.   INDICATIONS:  Mr. Vandrunen is a 60 year old gentleman with recurrent  gastroesophageal reflux disease from a large paraesophageal hernia and  severe delayed gastric emptying who had had prior surgery.  He underwent  laparoscopic repair of his paraesophageal hernia with Nissen  fundoplication and tolerated that part well.  He also had a pyloroplasty  for severe delayed gastric emptying.  Unfortunately he had a leak at  that region that strictured down.  It initially seemed to open up and he  was tolerating liquids.  His leak seemed to resolve on its own with  drainage, antibiotics and TNA.  He unfortunately could not advance to a  solid diet and had radiographic evidence of a stricture with possible  recurrent leak.   Options were discussed in detail from endoscopic to a surgical  laparoscopic and open options.  The risks, benefits and alternatives  were discussed in detail as well.  Recommendation was made for  diagnostic laparoscopy with lysis of adhesions, gastrojejunostomy to  bypass the gastric outlet stricture, since he had evidence of  intermittent leaking there and I did not feel it was safe to just treat  endoscopically.  The most aggressive option would be subtotal  gastrectomy but I think that is a rather draconian measure at this  point.  I also felt that feeding jejunostomy would be warranted, given  the fact his p.o. intake has been intermittent and he had been relying  on parenteral nutrition whereas if we can get enteral nutrition it would  help.   Again, the risks, benefits and alternatives were discussed in detail,  questions were answered and he agreed to proceed.   OPERATIVE FINDINGS:  He had dense adhesions.  He had a fair amount of  phlegmonation in his right upper quadrant primarily omentum,  gallbladder, and transverse colon around his pylorus.  There is no  evidence of perforation persistent.  There is no intra-abdominal  abscesses.  He had numerous interloop  adhesions and the adhesions to his  anterior abdominal wall.  His gastrostomy tube was along about the mid  greater curvature of the stomach and appeared to be intact with no  abnormalities.   DESCRIPTION OF PROCEDURE:  Informed consent was confirmed.  The patient  received IV Invanz prior to surgery.  He had sequential compression  devices active during the entire case.  He underwent general anesthesia  without any difficulty.  He was positioned supine with arms tucked.  He  was placed in a split leg table.  He had a Foley catheter sterilely  placed.  His abdomen was clipped, prepped and draped in sterile fashion.   A 5 mm port was placed in the right upper quadrant using optical entry  technique with the patient in steep reverse Trendelenburg and right side  up.  I chose to be rather lateral since most of most of the pyloroplasty  was more in the midline.  I entered without any difficulty.  Capnoperitoneum was inflated to 15 mmHg and provided good intra-  abdominal insufflation.  Under direct visualization, 5 mm ports were  placed in the right flank and right lower quadrant.  Extensive lysis of  adhesions done to free enough working space, so a 5 mm port could be  placed in the left lower quadrant, left flank, and a 12 mm port placed  in the left subcostal region.  Small bowel adhesions were freed off and  ultimately I was able free up most small bowel loops although there was  a dense loop of small bowel adherent in the right upper quadrant kind of  near where his pyloroplasty was.  It looked rather inflamed.  Because he  did not have any definite SB obstructive symptoms, I elected to leave  that region alone, given it being still inflamed and risk of  enterocutaneous fistula would go way up.   I did run the small bowel from his ileocolonic region to the ligament of  Treitz and any interloop adhesions were freed off as well.  Ligament of  Treitz could be easily seen.  Actually the  gastrostomy tube in his  stomach was stuck to his anterior abdominal wall.  I went ahead and  freed gastrocolic omentum, staying a few centimeters lateral to the  greater curvature to preserve the gastroepiploic vessels.  In doing this  the transverse colon came down very easily and I could see and preserve  the transverse mesocolon very well.  I followed this about halfway up  the greater curvature and then also followed it distally down toward the  pylorus but not right at it.  I could see the posterior wall of  the  stomach very easily.  Small bowel could easily be run from ligament of  Treitz and came up easily in an antecolic fashion.  He did have some  retrocolic mesentery inflammation, and I worry he had had evidence of  some peripancreatic inflammation.  I felt it was not wise to try and do  it in a retrocolic fashion since it seemed a little inflamed in that  region.  I therefore brought it up antecolically.   A loop about starting 20 cm distal to ligament of Treitz was easily  brought out to the posterior gastric wall.  I did a few 2-0 silk  stitches to help secure the antimesenteric small bowel to the greater  curve of the stomach and about two thirds down the greater curvature.  A  posterior wall gastrotomy was performed and an enterotomy was performed.  A side-to-side stapled gastrojejunostomy anastomosis was done using a  yellow load of the 60 mm Echelon laparoscopic staplers x2 firings to  good result toward the distal greater curve.  I did place a 2-0 silk  stitch at the distal end of the gastrojejunostomy so that there was a  stitch at the proximal and distal ends to help decrease tension.  I did  make an inspection of the stomach and there were some large fragments of  solid food in there that were undigested yet but hemostasis was  excellent.  The staple opening was closed using 2-0 PDS in running  fashion and also interrupted 2-0 PDS and silk stitches for a second   layer to provide good closure.  Meticulous inspection was done and  hemostasis was excellent and seal was tight.   I ran the small bowel about another 30 cm distally and brought a loop of  small bowel to the left upper quadrant and tacked it to the anterior  abdominal wall using interrupted 2-0 silk stitches x4.  An 18-French  feeding tube was chosen, silicone based.  A 14-French Angiocath was  passed through the abdominal wall into the small bowel.  A Glidewire was  passed down the distal bowel without any difficulty.  A urological  sheath dilator was placed around it to help dilate up the defect as  well.  A number 5 mm laparoscopic port inner cannula was used as well to  help dilate the channel well.  I was able to pass the feeding tube over  the Glidewire into the small bowel and advance it distally with careful  care.  It flushed well.  There was no evidence of any perforation or  other abnormality.  Another 2-0 PDS stitch was done to help tack the  rest of small bowel to the anterior abdominal wall so that there was  circumferential serosal to peritoneal apposition around the jejunostomy  tube.  Jejunostomy tube able to be advanced to the hilt which was about  30 cm and flushed well.  It was secured to the skin using 2-0 silk  interrupted stitches x4.  It was capped off.   Inspection and irrigation was performed.  There was evidence of small  serosal tear which I repaired laparoscopically using 3-0 Vicryl  laparoscopic suturing.  There was no evidence of any other intra-  abdominal injury or kinking.  The anastomosis was viable.  I placed a 52-  [place] Blake drain as noted above, secured it to the skin with 2-0 silk.  The capnoperitoneum was evacuated.  Ports were removed.  The 12-0 port  site  through which I placed a stapler in the left subcostal region  actually slid up the costal ridge but I went and closed it with 0 Vicryl  fascial stitch.  The rest of the skin was closed using  4-0 Monocryl  stitch.  Sterile dressings applied.  The patient was extubated and sent  to recovery room in stable condition.   I am about to explain the operative findings to the patient's family.      Adin Hector, MD  Electronically Signed    SCG/MEDQ  D:  12/26/2007  T:  12/26/2007  Job:  [id number]   cc:   Gatha Mayer, MD,FACG  [facility]  [address] [place], [place] [address]

## 2011-01-10 NOTE — Discharge Summary (Signed)
NAMEHYDEN, Gary Vasquez NO.:  [id number]   MEDICAL RECORD NO.:  [id number]          PATIENT TYPE:  INP   LOCATION:  0037                         FACILITY:  [facility]   PHYSICIAN:  Adin Hector, MD     DATE OF BIRTH:  1951/07/28   DATE OF ADMISSION:  10/23/2007  DATE OF DISCHARGE:  12/11/2007                               DISCHARGE SUMMARY   PRIMARY CARE PHYSICIAN:  Micah Flesher. Halm, DO, FAAP.   GASTROENTEROLOGIST:  Milus Banister, M.D.   SURGEON:  Adin Hector, M.D.   PRIMARY DIAGNOSIS:  1. Recurrent severe gastroesophageal reflux disease with aspiration      pneumonia.  2. Giant recurrent paraesophageal hernia.  3. Markedly delayed gastric emptying.   PROCEDURE PERFORMED:  1. Laparoscopic lysis of adhesions.  2. Laparoscopic paraesophageal hernia repair with bioabsorbable mesh      reinforcement.  3. Laparoscopic short Nissen fundoplication of [id number] bougie.  4. Redo Nissen fundoplication.  5. Laparoscopic pyloroplasty with primary closure on October 23, 2007.   OTHER DIAGNOSES:  1. Anxiety.  2. Left brain stroke causing visual field deficit.  3. Hypertension.  4. Hypercholesterolemia.  5. History of migraine headaches.  6. Hepatitis C.  7. History of Crohn disease.   SUMMARY OF HOSPITAL COURSE:  [name] is a 60 year old gentleman who  had a prior fundoplication for significant reflux and had evidence of  recurrence of a hiatal hernia, reflux disease and markedly delayed  gastric emptying.  He was followed closely by Dr. Ardis Hughs and [facility] and had a workup with medications but kept having  recurrent aspiration pneumonias.  Given the significant reflux symptoms  despite maximal medical therapy, surgical consultation requested and  recommended redo fundoplication with absorbable mesh buttress  reinforcement at this time.  Workup showed markedly delayed gastric  emptying which I think was a marked contributor to the  aspiration  pneumonias and therefore I recommended pyloroplasty as a better way to  improve gastric emptying.  Risks, benefits, and alternatives were  discussed in extensive detail and agreed to proceed.  He underwent the  procedure noted above.   Postoperatively he was very anxious about getting the swallow study and  initially he refused but after talking to him we had a swallow study  done on postop day #1.  It showed no passage of contrast into the  stomach.  When I looked at his stomach it was profusely dilated.  Once I  saw that, I asked them to put an NG tube in and he initially refused but  eventually I convinced him the importance of getting it in there.  Although I did not have any definite documentation of leak, his drain  output on postop day #2 had changed to green for a few hours although  then it interestingly changed back to serosanguineous rapidly after  that, so I was worried he had a small leak that was contained after I  had put the NG tube back in him.  At this point he had dropped some  saturations as well.  We transitioned him over to a step-down unit to  watch him given his hypertension.  He ended up being rather tachycardiac  and hypertensive through most of his hospital stay and this was better  controlled with beta blockers.  I was suspicious of a possible leak even  though there was no definite documentation of this.  Therefore, I made  him n.p.o. and put him on parenteral nutrition.  I was worried about a  possible pancreatitis as well.  Repeat of the swallow study on postop  day #2 through the NG tube, there was no passage of contrast through the  duodenum.  There was no evidence of a leak.  I kept him n.p.o., placed  him on some IV steroids to help with swelling around the wrap as well as  around the pyloroplasty.   By postop day #5 he started having some flatus and repeated Gastrografin  swallow.  The repeat swallow on postop day #7 showed that the  Nissen  wrap had softened up but he had not opened up at his pylorus.  He did  have some leukocytosis but I thought that might have been secondary to  steroids, although he did have a little bit of fever spike.  Based on  concerns, I got a CT scan of the abdomen which did show some perihepatic  fluid collections.  These were percutaneously drained.  They never grew  anything but I did switch him over to broader imipenem and fluconazole  for better antibiotic coverage and kept his NG tube in place.  He drain  output tapered off, and those were gradually removed.  A follow-up CT  scan later in the hospital stay showed no new fluid collections.   Repeat swallow study was done about 2 weeks out and it did show evidence  of a leak and a stricture.  Around that time he accidentally pulled his  NG tube out and adamantly refused until I had to come back in person and  explain the importance of having an internal drainage to help control  the leak internally as well as externally.  Gradually he acquiesced and  allowed me to place it back in.   The next study the following week had no evidence of duodenal  extravasation and no longer had any gastric outlet obstruction.  He had  nearly pulled his NG tube out at this point, and he started having bowel  movements and flatus.  I went ahead and removed the NG tube and started  him on p.o.  He tolerated full liquids the following day.  He did start  getting some diarrhea but I thought it was probably secondary to gastric  emptying improving.  However, later in the day he started having  worsening pain and bloating.  CT scan done the following day showed  massive gastric distention and inflammation, again with evidence of a  possible leak or abscess around the area.  There was nothing that was  necessarily drainable but an NG tube was replaced and got a liter of  fluid out.  He felt better after this and his abdominal pain decreased.  Repeat study did  show a 3 x 5 cm abscess near his pylorus.  This was  percutaneously drained.  I tried to talk to him about considering a  gastrostomy tube with a jejunostomy tube to feed distal to the leak,  given his chronic problems and significant delayed gastric emptying.  The patient refused.   I discussed  the case with numerous partners as well during his  hospitalization, and the hope was to try and sit on this and see if  things could calm down.  I had not seen any family members nearby very  often.  I tried to talk to his parents when they did show up.  He had  refused me to talk to anybody about things.  However, the patient wished  me to talk to his employer who was also a friend, so I talked to his  friend/employer about this in detail on postop day 29.   On postop day number 34 he had a repeat upper GI that did show liquid  going down into his duodenum and jejunum but a possible small persistent  leak.  I had a lot of discussion with the patient and family in detail  about the plans and recommendations.  He again was holding off on the  gastrostomy tube.  He really wanted to have the NG tube out and just try  and eat but I tried to stress to him that that would not be a wise  choice.  His leukocytosis resolved and his infections resolved.  A  repeat scan the following week showed no more persistent abscesses nor  any extravasation of leak.  Repeat swallow study was done on  postoperative day #41.  There was still evidence of a contained leak.  The patient allowed a gastrostomy tube to be placed by interventional  radiology.  They were not successful in passing a jejunostomy tube down  across the pyloroplasty, however.   The following week, a repeat study showed there was no evidence of any  leak.  There was some irregular folding around the duodenum but no  evidence of any more obstruction.  He was started on a liquid diet and  advanced to full liquids.  He was able to have good flatus  and belching.  Two days after observation, he went home on a full liquid diet.  Based  on these improvements, we thought he could be discharged with the  following instructions:   1. He is to return to clinic to see me in a couple weeks to see how he      is doing.  If he has persistent obstructive symptoms, we might      consider endoscopy with dilatation as long as there is no evidence      of a leak.  If he has recurrent stenosis or other problems, he      probably will require a gastrojejunostomy to bypass the area of the      stricture as I do not think it is wise to operative around where      the pyloroplasty is.   1. He should call if he has any fevers, chills, sweats, nausea or      vomiting, uncontrolled pain or other concerns, as he is at risk for      development of another postoperative abscess.   HE SHOULD GO BACK HOME ON HIS MEDICATIONS INCLUDING:  1. Wellbutrin 150 mg t.i.d.  2. Clonazepam 2-4 mg q.h.s. p.r.n. insomnia.  3. Ambien 10 mg p.o. q.h.s. p.r.n. insomnia.  4. He was taking Zegerid, but I do not think that he can take that      anymore, he may need some Reglan p.r.n. pain or discomfort.  5. Tylenol p.r.n. pain.  6. Ibuprofen p.r.n. pain.  7. Hydrocodone/APAP 1-2 p.o. q.4 h p.r.n. pain.  Adin Hector, MD  Electronically Signed     SCG/MEDQ  D:  12/30/2007  T:  12/30/2007  Job:  [id number]   cc:   Milus Banister, MD  [address]  [place], [place] [address]   Burnett Harry. Joya Gaskins, MD, FCCP  [address]  [place] [address]   Steven J. Cleta Alberts DO, [place]  Fax: [phone]

## 2011-01-10 NOTE — Assessment & Plan Note (Signed)
[place]                             PULMONARY OFFICE NOTE   Gary Vasquez, Gary Vasquez                      MRN:          [id number]  DATE:05/15/2007                            DOB:          01-22-1951    CHIEF COMPLAINT:  Evaluate dyspnea.   HISTORY OF PRESENT ILLNESS:  This is a 60 year old white male who has  had chronic dyspnea for a year, getting progressive worse.  He has  preexisting issue of severe reflux disease and Crohn's disease.  He has  had esophageal strictures, esophagitis, and chronic dysphagia.  He has  had sever bouts of aspiration pneumonia.  The last one was 6 months ago.  He was not hospitalized then but was hospitalized in 2006 with an  episode.  X-ray in February of this year did show left upper lobe  infiltrate.  Dyspnea is increasing now for the past year, worse with  exercise.  If he walks a mile, he is short of breath, he runs or goes up  a flight of stairs, he is worse.  He has a dry cough that is worse with  heartburn.  He denies any chest pain.  He is still having heartburn  despite being on Zegerid on a once daily dosage.  When he was on b.i.d.  dosing Zegerid, he was improved.  He does underlying sleep apnea with  snoring and daytime hypersomnolence but is not, however, receiving CPAP  therapy.  He smoked a pack a day x15 years, quit smoking 8 years ago.  He does have continued difficulty swallowing, soreness in the throat,  tooth and dental problems, some nasal congestion, sneezing, notes  depression, hand and foot swelling.  He is referred now for further  evaluation.   PAST MEDICAL HISTORY:  1. Medical history of hypertension.  2. Hypercholesterolemia.  3. History of stroke with left side weakness in January 2007, now      improved.  4. History of sleep disorders.   OPERATIVE HISTORY:  1. He had a partial colectomy with reanastomosis in 1978 because of      Crohn's disease.  2. He had a Nissen fundoplication 5  years ago at [facility], but the      wrap from the Nissen fundoplication has loosened.   MEDICATION ALLERGIES:  None.   CURRENT MEDICATIONS:  1. Zegerid 40 mg daily.  2. Aspirin 81 mg daily.  3. Wellbutrin 150 mg b.i.d.  4. Clonazepam 2 mg h.s.   FAMILY HISTORY:  Positive for heart disease in a father.  Father had  prostate cancer.  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  He is divorced.   REVIEW OF SYSTEMS:  Otherwise noncontributory.   PHYSICAL EXAMINATION:  This is a middle-aged male in no distress.  Temp 99.9, blood pressure 120/84, pulse 90, saturation 95% room air.  CHEST:  Clear actually with no evidence of wheeze, rale, or rhonchi.  CARDIAC:  Regular rate and rhythm without S3.  Normal S1, S2.  ABDOMEN:  Soft, nontender.  EXTREMITIES:  No clubbing, edema, or venous disease.  SKIN:  Clear.  NEUROLOGIC:  Intact.  HEENT:  No jugular venous distention, no lymphadenopathy, oropharynx  clear, neck supple.  No significant constitutional symptoms seen.   LABORATORY DATA:  Chest x-ray obtained from February 2008 showed  persistent left upper lobe infiltrate.  No recent x-rays are available.  No recent pulmonary functions are available.   IMPRESSION:  Chronic dyspnea, unclear etiology.  I am suspecting the  patient is having chronic aspiration syndrome with subsequent lower  airway inflammation, also associated reflux disease.   RECOMMENDATIONS:  We will be obtaining full set of pulmonary function  studies.  Spirometry was not available today in the office; therefore,  full PFTs were ordered.  Obtain a chest x-ray today as well.  Increase  Zegerid to 40 mg b.i.d. in the interim and begin Symbicort 160/4.5, 2  sprays b.i.d.  The patient was instructed as to its proper use, and we  will see the patient back in return followup in 1 month.     Burnett Harry Joya Gaskins, MD, [facility]  Electronically Signed    PEW/MedQ  DD: 05/16/2007  DT: 05/16/2007  Job #:  [id number]   cc:   Tammi Sou, MD

## 2011-01-10 NOTE — Op Note (Signed)
NAME:  Gary Vasquez, CIMO NO.:  [id number]   MEDICAL RECORD NO.:  [id number]          PATIENT TYPE:  AMB   LOCATION:  CARD                         FACILITY:  [facility]   PHYSICIAN:  Burnett Harry. Joya Gaskins, MD, FCCPDATE OF BIRTH:  26-Oct-1950   DATE OF PROCEDURE:  05/20/2007  DATE OF DISCHARGE:                               OPERATIVE REPORT   BRONCHOSCOPY.:   INDICATIONS:  Persistent right lower lobe infiltrate, evaluate for  cause.   OPERATOR:  Burnett Harry. Joya Gaskins, MD.   ANESTHESIA:  1% Xylocaine local.   PREMEDICATION:  Fentanyl 80 mcg, Versed 8 mg IV push.   PROCEDURE:  The Pentax video bronchoscope was introduced via the  oropharynx.  The upper airways were visualized and were unremarkable.  The entire tracheobronchial tree was visualized and revealed diffuse  tracheobronchitis.  No endobronchial lesions were seen.  No significant  secretions were seen.  Attention was then paid to the right lower lobe.  Transbronchial biopsies x6 were obtained.  Bronchial washings were  obtained.   COMPLICATIONS:  None.   IMPRESSION:  Right lower lobe infiltrate with previous left upper lobe  infiltrate, probable recurrent aspiration with organized pneumonia.   RECOMMENDATIONS:  Follow up pathology and microbiology.      Burnett Harry Joya Gaskins, MD, [facility]  Electronically Signed     PEW/MEDQ  D:  05/20/2007  T:  05/21/2007  Job:  [id number]   cc:   Tammi Sou, MD  Fax: [phone]

## 2011-01-10 NOTE — Discharge Summary (Signed)
[name], [name] NO.:  [id number]   MEDICAL RECORD NO.:  [id number]          PATIENT TYPE:  INP   LOCATION:  25                         FACILITY:  [facility]   PHYSICIAN:  Adin Hector, MD     DATE OF BIRTH:  [birth year]   DATE OF ADMISSION:  12/23/2007  DATE OF DISCHARGE:                               DISCHARGE SUMMARY   PRIMARY CARE PHYSICIAN:  Dr. Meda Klinefelter.   CARDIOLOGIST:  Dr.  Jacqulyn Ducking.   PULMONOLOGIST:  Dr. Asencion Noble.   SURGEON:  Dr. Michael Boston.   GASTROENTEROLOGIST:  Dr. Owens Loffler.   FINAL DIAGNOSIS:  Gastric outlet obstruction secondary to stricture/leak  at pyloroplasty site.   OTHER MAIN DIAGNOSES:  1. Recurrent severe gastroesophageal reflux disease with aspiration      pneumonias.  2. Recurrent giant paraesophageal hernia.  3. Markedly delayed gastric emptying.  4. Status post laparoscopic lysis of adhesions and paraesophageal      hernia repair,  re-do Nissen fundoplication, and pyloroplasty with      primary closure on October 23, 2007.   MAIN PROCEDURE PERFORMED:  1. Lysis of adhesions.  2. Gastrojejunostomy with feeding jejunostomy on December 26, 2007.   OTHER DIAGNOSES:  1. Severe anxiety.  2. Left brain stroke causing visual field defects.  3. Hypertension.  4. Hypercholesterolemia.  5. History of migraine headaches.  6. Hepatitis C.  7. Crohn's disease.   SUMMARY OF HOSPITAL COURSE:  Mr. Gary Vasquez is a 60 year old gentleman who  had paraesophageal hernia and severe reflux with aspiration pneumonias  who underwent repeat surgery.  He had markedly delayed gastric emptying  and did have pyloroplasty as well.  Unfortunately, he had bloating  problems and leak and stricture at his pyloroplasty site that did not  resolve through non-operative measures.  Therefore, two months later, I  took him to the operating room and did lysis of adhesions and did a  gastrojejunostomy to bypass the strictured pylorus.  Given  his prolonged  course, I also did a feeding jejunostomy on him.   Postoperatively he was kept on IV parenteral nutrition since he was  having difficulty initially tolerating tube feeds.  He had an issue with  a little bit of diarrhea but after a few doses of Imodium, that seemed  to calm down.  He was gradually advanced on his tube feeds.  He was able  to tolerate 12 hours of tube feeds at night and eventually started  taking more oral intake on his own.  He was able to take in about 25%-  40% of his own nutritional needs.  Therefore, he was weaned off his IV  nutrition.  He was followed a few days afterward and did not have any  severe dehydration.  He had some mild anemia but this was stable.  He  did not have any evidence of renal failure or electrolyte abnormalities.   He is still struggling with insomnia but after 4 clonazepam at night and  Ambien, plus increased activity during the day, he seemed improved.  He  did have some issues with anxiety/depression  but seemed to be reassured  after making improvements.  Physical therapy saw him and cleared him.  He was given jejunostomy and J tube care.  Basically he has improved  enough and discharged home with the following instructions.   DISCHARGE INSTRUCTIONS:  1. He is to return to clinic to see me in about 10-14 days.  2. He should protect his jejunostomy and gastrostomy tubes at all      times.  This eventually will come out when he is able to eat more.  3. He should have an anti-dumping diet, keeping solids and liquids      separate and increasing as tolerated.  4. Oral supplementation shakes would be good as well.  He tolerated      the chocolate [employer] but is very picky on the      other stuff.  5. He should continue tube feeds at night under oral nutrition      improves.  6. No TNA unless poor tolerance to oral intake  7. He should take Reglan 10 mg p.o. q.a.c. and nightly p.r.n. nausea.  8. Reglan  suppositories 25 mg p.r. at night q.6 p.r.n. nausea.  9. He should resume his other home medications including   Wellbutrin 150 mg p.o. 2 times a day.  Aspirin 81 mg daily.  Zocor 20 mg daily nightly.  Benicar 20 mg daily.  Clonazepam 2-4 mg p.o. nightly.  Ambien 10 mg p.o. nightly.  Darvocet N-100 one to two p.o. q.6 h., p.r.n. pain.    (He seems to have poor tolerance to most narcotics but seems to  tolerate the Darvocet rather well.)  Tylenol p.r.n. pain.   1. He should avoid nonsteroidal anti-inflammatory drugs at this time      given his recent other issues.  2. He should call if he has any fever or chills, sweats, nausea or      vomiting, worsening pain or other issues.  3. He should take Imodium 2-4 mg q.12 h., p.r.n. diarrhea.      Adin Hector, MD  Electronically Signed     SCG/MEDQ  D:  01/07/2008  T:  01/07/2008  Job:  [id number]   cc:   Meda Klinefelter  Fax: [phone]   Cristopher Estimable. Lattie Haw, MD, [place]  [place], [place] [address]   Roland Joya Gaskins, MD, FCCP  [address] [address]   Steven C Gross, MD  [place] [address]   Daniel P. Jacobs, MD  [address]  [place], [place] [address]

## 2011-01-10 NOTE — H&P (Signed)
NAME:  Gary Vasquez, Gary Vasquez NO.:  [id number]   MEDICAL RECORD NO.:  [id number]          PATIENT TYPE:  INP   LOCATION:  12                         FACILITY:  [facility]   PHYSICIAN:  Jonne Ply, MD   DATE OF BIRTH:  03/21/1951   DATE OF ADMISSION:  DATE OF DISCHARGE:                              HISTORY & PHYSICAL   ADMISSION DIAGNOSES:  1. Probable gastric outlet obstruction.  2. Status post Nissen fundoplication.  3. Pyloromyotomy.   HISTORY OF PRESENT ILLNESS:  The patient is a very pleasant 60 year old  white male who is now approximately 10 weeks status post redo hiatal  hernia repair with Nissen fundoplication and pyloromyotomy.  Postoperatively, his course was complicated by a leak at the duodenum.  This was treated with percutaneous drainage and G-tube drainage.  The  patient remained in the hospital on TNA for prolonged course.  He was  discharged home about 10 days ago after followup upper GI showed no  evidence of leak and flow of contrast into the duodenum.  The patient  now presents with inability to eat without having to empty his G-tube.  He is having to  do this at least 2 or 3 times a day.  He feels weak and  comes in requesting medical attention.  He denies fever or chills,  acholic stools, or dark urine.   PAST MEDICAL HISTORY:  Significant for Nissen fundoplication IN 8682 and  redo in 2009 with the above course, status post colectomy for Crohn  disease, mild hypertension.   MEDICATIONS:  Include clonazepam, simvastatin, aspirin, Benicar, and  Hydrocodone.   REVIEW OF SYSTEMS:  Review of systems is significant as above.   PHYSICAL EXAMINATION:  GENERAL: On physical exam, he is an age-  appropriate white male in no distress.  VITAL SIGNS: Blood pressure is 101/64, temperature is 97, and heart rate  is 128.  HEENT: Exam is benign.  Normocephalic, atraumatic.  Pupils are equal,  round, and reactive to light.  NECK: Supple and soft.  No  thyromegaly or cervical adenopathy.  LUNGS: Clear to auscultation and percussion x2.  HEART: Regular rate and rhythm without murmurs, rubs, or gallops.  ABDOMEN: Soft and minimally distended.  G-tube is clamped.  EXTREMITIES: Show no clubbing, cyanosis, or edema.   LABORATORY DATA:  Show an albumin of 3 and total protein of 7.   IMPRESSION:  Probable gastric outlet obstruction.   PLAN:  Admission IV fluids, hyperalimentation, upper GI, and probable  need for surgical intervention requiring gastrojejunostomy.      Jonne Ply, MD  Electronically Signed     KRE/MEDQ  D:  12/23/2007  T:  12/24/2007  Job:  [id number]   cc:   Adin Hector, MD  [place]  [place] [address]

## 2011-01-10 NOTE — Op Note (Signed)
NAME:  Gary Vasquez, Gary Vasquez NO.:  [id number]   MEDICAL RECORD NO.:  [id number]          PATIENT TYPE:  OIB   LOCATION:  [place]                         FACILITY:  [facility]   PHYSICIAN:  Adin Hector, MD     DATE OF BIRTH:  1951/02/08   DATE OF PROCEDURE:  10/23/2007  DATE OF DISCHARGE:                               OPERATIVE REPORT   PRIMARY CARE PHYSICIAN:  Dr. Earlene Plater.   GASTROINTESTINAL:  Milus Banister, MD.   SURGEON:  Adin Hector, MD.   ASSISTANTS:  1. Thomas A. Cornett, M.D.  2. Whitney Hammer-Brooks, [employer] Student.   PREOPERATIVE DIAGNOSES:  1. Recurrent gastroesophageal reflux disease.  2. Recurrent paraesophageal hernia.  3. Markedly delayed gastric emptying.   POSTOPERATIVE DIAGNOSES:  1. Recurrent gastroesophageal reflux disease.  2. Recurrent paraesophageal hernia.  3. Markedly delayed gastric emptying.   PROCEDURES PERFORMED:  1. Laparoscopic lysis of adhesions x120 minutes (equals half of the      case).  2. Laparoscopic paraesophageal hernia repair using primary closure      with flexed HD bioabsorbable underlay mesh.  3. Laparoscopic short Nissen fundoplication (1.2 cm) over a 56-French      bougie.  4. Laparoscopic pyloromyotomy.   ANESTHESIA:  1. General anesthesia.  2. Local anesthetic and field block around all port sites.   ESTIMATED BLOOD LOSS:  150 mL.   COMPLICATIONS:  No major complications.   DRAIN:  A 19-French Blake drain with the tip in the true mediastinum and  then runs along the lesser curvature stomach over the pyloromyotomy and  out a left upper quadrant port site secured with 2-0 nylon stitch.   INDICATIONS:  Gary Vasquez is a 60 year old gentleman who has been stricken  with significant heartburn and reflux of increasing and also some  probable aspiration pneumonia.  He underwent a prior Nissen  fundoplication Dr. Magnus Sinning while he was at [facility] in 2002.  He has had dwindling  control on proton pump inhibitors and Zegerid.  He  had evidence of reflux disease on pH probe study and Bravo.  He had  evidence of some esophageal dysmotility with some simultaneous  contractions but not consistent with achalasia.  He had evidence of  markedly delayed gastric emptying greater than 120 minutes.   Based on these concerns, I recommended that we do laparoscopic takedown  of his fundoplication and reduction of this paraesophageal hernia that  has been seen on EGD and upper GI.  I recommended we do a short Nissen  fundoplication  given the fact he has some dysmotility and not the  classic 2-2.5 cm length but more like a 1-1.5 length (or a possible  Toupet partial fundoplication).  I also recommended we do a  pyloromyotomy for help with gastric emptying to prevent further episodes  of reflux.   Risks such as stroke, heart attack, deep venous thrombosis, pulmonary  embolism and death were discussed.  Risks such as bleeding, need for  transfusion, would infection, abscess, injury to organs, leak requiring  reoperation, recurrent hiatal herniation, incisional hernia, prolonged  pain, dumping and numerous other risks were discussed.  Questions were  answered and he agreed to proceed.   FINDINGS:  He had moderate size posterior paraesophageal herniation,  mostly posterolateral.  His wrap had partially come down.  He had a very  thickened pylorus.  His stomach did not seem markedly enlarged.  He did  have some moderate omental adhesions on his mid abdomen and lower  abdomen.   DESCRIPTION OF PROCEDURE:  Informed consent was confirmed.  The patient  received 2 grams IV cefazolin just prior to surgery.  He had sequential  compression devices active during the entire case.  He underwent general  esthesia without any difficulty.  He had a Foley catheter sterilely  placed.  He had an orogastric tube carefully placed.  He was position on  a split leg table supine with foot rest and  arms tucked.  His abdomen  was clipped, prepped and draped in a sterile fashion.   Entry was gained in the abdomen and the left upper quadrant paramedian  incision using optical entry technique to place a 5-mm port.  Capnoperitoneum 15 mmHg provided good intra-abdominal insufflation.  There were a fair amount of adhesions of omentum to the anterior  abdominal wall.  There was free space up in the subxiphoid region so a 5-  mm port was placed up there.  Using sharp dissection, I was able free  the omentum from the upper part of the abdomen supraumbilically.  Enough  space was created so a 10-mm port could be placed in the left subcostal  region.  Another 5-mm port was placed just infraumbilically and two more  in the left upper quadrant.  The subxiphoid port was removed and  Nathanson omega shaped fixed liver retractor was placed under direct  visualization and used to help lift the left lateral sector liver  anteriorly.   Attention was turned towards the esophageal hiatus.  He had some  moderate adhesions to the left lateral sector to the anterior part of  his wrap.  I was able to free some of this off sharply and avoided using  cautery.  With careful dissection, I was able skeletonize some of the  wispy tissues and free some side attachments along the lesser curvature  to the stomach.  However, the inflation became a little more dense, so I  had to approach from more to the left side.   I was able to take a few more short gastric attachments on the left side  of the stomach a little more distally along the mid greater curvature of  the stomach.  With rolling the stomach on the left side to get to the  posterior aspect, I removed some posterior attachments of the stomach to  the retroperitoneum.  It was wispy.  In doing this and rolling, I was  able to identify the left crus.  Careful dissection was done to help  preserve the left crus as much as possible and an obvious  paraesophageal  hernia could be seen on the left posterior aspect.  Using careful  dissection using primarily combined blunt and ultrasonic dissection, I  was able to help free the stomach and get it reduced back in.  The left  crus was defined from its apex down to its base.  In doing that, I could  help identify the base of the right crus.  Further dissection was done  posteriorly to help get the posterior part of the mediastinum on the  right side freed up.   In doing that, I was able to get a window posteriorly we used a Penrose  to help pull the wrap out of the mediastinum and help keep the stomach  and esophagus on axial tension.  In pulling over to the left side, I was  able with further careful dissection, free the right side of the wrap  off the right crus.  A type 2 mediastinal dissection was done to get  enough adequate interesophageal length and got about 5 cm of intra-  abdominal esophageal length.  There is no evidence of shortened  esophagus.   The green Ethibond stitches from the prior Nissen wrap could be seen and  they were somewhat broken down, but I was able to sharply cut along this  and help take the wrap down and help bring the right side of the wrap  retroesophageally and help straighten out the cardia until its position  was more normalized.  Further dissection was done up in the mediastinum  for a better esophageal length.  The posterior vagus nerve could be seen  and preserved during the entire case.  His left gastric artery seemed to  be tied in with the crura and was taken.  The stomach was viable.  There  were some wispy fibers of the anterior vagus that were still intact and  I made an effort to try and preserve these.   After adequate phrenoesophageal mobilization, the cardia was again  brought back retroesophageally over to the right side and the classic  shoeshine maneuver was used to help get a good posterior wrap of the  stomach and get a nice snug  wraparound.  The right side of the wrap was  rolled over further to expose esophageal hiatus.  The esophageal hiatus  was closed using zero Ethibond horizontal mattress stitches with  pledgets x2 to good result.   Given the fact this is a recurrence, an 8x12cm size Flex HD thick, but  not ultra-thick, mesh was used.  The mesh was cut in a U-shape.  At the  corners, I used a 3 mm puncher to drill some holes to allow stitches to  be placed.  It was placed in the abdomen with the rough side up against  the diaphragm.  It was secured and it was placed posteriorly for  posterior reinforcement with anterior flaps placed to cover the right  and left sides of the anterior diaphragm near the crura.  It was secured  to the diaphragm using interrupted 0 or #1 Ethibond stitch on all four  corners as well as a medial corner. A #1 Ethibond stitch was also placed  through the mesh posteriorly through the crural repair and brought out.  This was used to help also tack the posterior part of the stomach wrap  to the crura for good posterior gastropexy x2.  This helped lay the wrap  up well.   The most superior aspect of the left side of the wrap was tacked to the  left anterior crus for good left anterior gastropexy.  This helped set  up the wrap well for completion.  Two internal esophagogastric  plications were done (Guarner) stitches doing the medial part of the  left wrap and the lateral part of the esophagus on the left side and on  the right side.  Under direct visualization, a lighted a 56-French  bougie was passed by Dr. Winfred Leeds with anesthesia without any difficulty.  A three-stitch wrap  was used using #1 Ethibond stitch, taking a bite of  the anterior esophagus.  I measured it and it was 1.5 cm in length.  I  decided to make it not too long given some dysmotility on his manometry.  The wrap laid well.  Hemostasis was good.   Next, attention was turned towards the pyloromyotomy.  The duodenum  was  kocherized using the ultrasonic dissection.  There were some attachments  between the infundibulum gallbladder to the second part of the duodenum  and these were carefully freed off, skeletonized and then taken with  ultrasonic dissection.  In doing that, I could see the first and second  parts of the duodenum and mobilized as well.  In doing that, could  visualize the pylorus well.  I placed 3-0 PDS stitches were placed on  the superior and anterior borders at the level of the pylorus and held  under tension.  Harmonic was used to do a pyloromyotomy anteriorly.  The  pylorus appeared hypertrophied.  The myotomy was done.  Defect was  closed transversely to help keep the myotomy open using 3-0 PDS running  in a running fashion x2.  Two more interrupted stitches were done  exteriorly to make sure that there was good approximation.  Meticulous  inspection was done and there was good control.  There was no evidence  of any leaks.  Tisseal x10 mL was placed over the anterior surface of  the wrap for extra protection.  Also brought omentum over and secured it  circumferentially using some interrupted PDS stitches to help provide a  good wrap as well.   A 19-French Blake drain was placed as noted above.  The liver retractor  was removed under direct visualization with good result.  There were a  few small tears in the liver but the hemostasis was excellent and there  is no active bleeding.  Capnoperitoneum was evacuated and ports were  removed.  The left upper quadrant 10 mm-port site actually slid above  the costal ridge, but given the fact that he had to go back for a port  size herniation, I decided to go ahead and put a zero Vicryl stitch in  the area for reapproximation.  The other ports were small.  They were  close the 4-0 Monocryl stitch.  Sterile dressings applied.   The patient was extubated and sent to the recovery room in stable  condition.   I am about to explain the  operative findings to the patient's family.      Adin Hector, MD  Electronically Signed     SCG/MEDQ  D:  10/23/2007  T:  10/24/2007  Job:  [id number]   cc:   Milus Banister, MD  [place], [place] [address]   Earlene Plater, M.D.   Magnus Sinning, M.D.  [employer]
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
## 2011-01-13 NOTE — Group Therapy Note (Signed)
NAME:  Gary Vasquez, Gary Vasquez NO.:  [id number]   MEDICAL RECORD NO.:  [id number]          PATIENT TYPE:  INP   LOCATION:  P929                          FACILITY:  [facility]   PHYSICIAN:  Audria Nine, M.D.DATE OF BIRTH:  06-Aug-1951   DATE OF PROCEDURE:  09/23/2005  DATE OF DISCHARGE:                                   PROGRESS NOTE   SUBJECTIVE:  The patient felt much better overnight. He denied any fever,  headaches, chills, or rigors. He was actually looking forward to going home  today. He has had no nausea, vomiting, or diarrhea. No abdominal pain. No  frequency, urgency, or dysuria.   OBJECTIVE:  GENERAL:  Conscious, alert, comfortable and not in acute  distress.  VITAL SIGNS:  Blood pressure 141/89, pulse 112, respiratory rate 20,  temperature max was 101.4 degrees Fahrenheit.  HEENT:  Normocephalic and atraumatic. Oral mucosa was moist. No exudates.  NECK:  Supple. No JVD. No lymphadenopathy.  LUNGS:  Crackles were noted in the right base. No wheezing or rhonchi was  heard.  HEART:  S1 and S2 regular. No S3, S4, gallops, or rubs.  ABDOMEN:  Soft, nontender. Bowel sounds positive. No masses palpable.  EXTREMITIES:  No edema. No induration or tenderness was noted.   LABORATORY DATA:  White blood cell count had improved to 9.1. Hemoglobin and  hematocrit of 13.1 and 38.3. Platelet count 264,000. There was no left  shift. Potassium 3.7, chloride 102, sodium was 137, CO2 of 27, glucose 108,  BUN 10, creatinine 1.1, calcium 8.1.   ASSESSMENT/PLAN:  1.  Bilateral pneumonia with suspicion for aspiration due to severe reflux      disease. The patient appeared to be improving until this morning, when      he spiked a temperature again. The patient has been pancultured and      results are pending. It is unclear why the patient continues to spike a      temperature at this time. I will defer his discharge at this time and      will continue him on Levaquin and  clindamycin.  2.  Severe reflux disease. Will continue Nexium 40 mg p.o. b.i.d.  Anti-      reflux measures have been discussed in detail with him by Dr. Gala Romney.      Helicobacter Pylori serologies are pending.   DISPOSITION:  Will delay patient's discharge and will see him again in the  morning. Tomorrow, hopefully, fever will have resolved by then. If it does  not resolve, would wonder if this represents a drug fever . His chest CT  scan does confirm multi-lobe pneumonia still, and he might be just taking  his time getting better.     Audria Nine, M.D.  Electronically Signed    AM/MEDQ  D:  09/23/2005  T:  09/23/2005  Job:  [id number]
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
## 2011-01-13 NOTE — Assessment & Plan Note (Signed)
[facility]                             PULMONARY OFFICE NOTE   NAME:GIBBYRober, Skeels                      MRN:          [id number]  DATE:05/22/2007                            DOB:          11-15-1950    Lab results and bronch results were reviewed from this patient.  Shows  chronic inflammation, acute on chronic, but no malignancy on biopsy.  Cultures are negative.   IMPRESSION:  The patient has chronic recurrent aspiration with organized  pneumonia.   PLAN:  The patient is to receive pulsed prednisone at 40 mg a day with a  rapid taper.  He will maintain Symbicort as currently dosed.  No further  systemic antibiotics are indicated.  We will see the patient back on a  scheduled basis.     Burnett Harry Joya Gaskins, MD, [facility]  Electronically Signed    PEW/MedQ  DD: 05/22/2007  DT: 05/23/2007  Job #: [id number]   cc:   Tammi Sou, MD

## 2011-01-13 NOTE — Assessment & Plan Note (Signed)
[facility]                         GASTROENTEROLOGY OFFICE NOTE   [place], Olund SHANA YOUNGE                      MRN:          [id number]  DATE:08/26/2007                            DOB:          [birth year]    ESOPHAGEAL MONOMETRY REPORT   Mr. Sparr again attempted to complete esophageal monometry.  The testing  was incomplete.  He had extreme difficulty controlling his gag reflex  and only 4 wet swallows were achieved.  His results from these 4 showed  that only 25% of the contractions were peristaltic, but again this is an  incomplete study.  This attempt was done orally as he was unable to  tolerate nasal intubation on previous attempt.   This testing was done in workup for possible redo fundoplication.  I  will forward the results of this to his surgeon, Dr. Neysa Bonito.     Milus Banister, MD  Electronically Signed    DPJ/MedQ  DD: 08/26/2007  DT: 08/26/2007  Job #: [id number]   cc:   Adin Hector, MD

## 2011-01-13 NOTE — Procedures (Signed)
NAMEKHALIFA, KNECHT NO.:  [id number]   MEDICAL RECORD NO.:  [id number]          PATIENT TYPE:  OUT   LOCATION:  RAD                           FACILITY:  [facility]   PHYSICIAN:  Jacqulyn Ducking, M.D. [facility] OF BIRTH:  February 12, 1951   DATE OF PROCEDURE:  10/12/2005  DATE OF DISCHARGE:                                  ECHOCARDIOGRAM   REFERRING:  Dr. Cleta Alberts   CLINICAL DATA:  A 60 year old gentleman with tachycardia.   M-MODE:  Aorta 2.7, left atrium 3.9, septum 1.7, posterior wall 1.2, LV  diastole 3.5, LV systole 2.4.   1.  Technically adequate echocardiographic study.  2.  Normal left atrium, right atrium and right ventricle.  3.  Normal aortic, mitral, tricuspid and pulmonic valves. Normal proximal      pulmonary artery. Slight mitral annular calcification.  4.  Normal left ventricular size; no LVH except for slight thickening of the      proximal septum. Normal regional and global function.  5.  Normal Doppler examination.  6.  Normal IVC.      Jacqulyn Ducking, M.D. [facility]  Electronically Signed     RR/MEDQ  D:  10/13/2005  T:  10/13/2005  Job:  [id number]

## 2011-01-13 NOTE — Consult Note (Signed)
NAME:  Gary Vasquez, Gary Vasquez NO.:  [id number]   MEDICAL RECORD NO.:  [id number]          PATIENT TYPE:  INP   LOCATION:  N989                          FACILITY:  [facility]   PHYSICIAN:  R. Garfield Cornea, M.D. DATE OF BIRTH:  August 13, 1951   DATE OF CONSULTATION:  09/19/2005  DATE OF DISCHARGE:                                   CONSULTATION   CONSULTING PHYSICIAN:  R. Garfield Cornea, M.D.   REFERRING PHYSICIAN:  Bonnielee Haff, M.D.   REASON FOR CONSULTATION:  Severe acid reflux disease.   HISTORY OF PRESENT ILLNESS:  The patient is a 60 year old Caucasian  gentleman with a history of gastroesophageal reflux disease, who was  admitted with recurrent pneumonia.  The patient states that he has had  pneumonia at least three times in the last four months.  The other two  episodes were treated as an outpatient.  On Friday, he started having  chills, fever, and body aches.  He was having cough as well.  Upon  evaluation in the emergency room on a chest x-ray he had opacity in the left  upper lobe.  He was admitted with pneumonia.  The patient states that his  acid reflux has been poorly controlled for the past several months.  He was  doing very well, Nexium 40 mg b.i.d. when we saw him in August 2006.  At  that time he was having some dysphagia and underwent EGD and colonoscopy.  He had a distal esophageal ring which was dilated with a 52 and 54-French  Maloney dilator.  He had a small hiatal hernia.  The fundal wrap was no  longer in place.  He had a single erosion at the ileocolonic anastomosis.  Biopsy was benign.  __________ terminal ileum was normal at least to 20-cm.  After his endoscopies, he was forced to discontinue Nexium due to insurance  coverage.  He was switched to omeprazole, initially 40 mg b.i.d. and then 20  mg b.i.d.  Since that time, he states, his reflux has been uncontrolled.  He  has heartburn almost constantly.  It does not matter whether he is standing  or  laying down.  He notes at night, however, he often wakes up in the middle  of the night with acid regurgitation and sometimes strangles.  If he gets  strangled and chokes, then a few days later he typically beings with fever  and pneumonia.  He describes at least ten episodes of pneumonia in the last  two years.  He has had some epigastric soreness, nausea, vomiting this time.  His bowel movements remain regular.  He does have increased tendencies  towards constipation however.  No melena or rectal bleeding.  He has a  history of Nissen fundoplication in 2119 at [facility].  He states he  had six months of relief from his acid reflux.  Surgery was complicated by  small-bowel obstruction requiring lysis of adhesions two days later.  The  patient states he has had dysphagia pre-dating his Nissen fundoplication.  Recent esophageal dilatation did not seem to help.  He has  difficulty  swallowing, dry meats and bread.   MEDICATIONS:  Prior to admission:  1.  Prilosec 20 mg b.i.d.  2.  Darvocet-N 100 1-2 daily.  3.  Requip 3 mg q.h.s.   ALLERGIES:  No known drug allergies.   PAST MEDICAL HISTORY:  1.  Crohn disease, status post right hemicolectomy at [facility] by      Dr. Prince Solian, status post small bowel abscess, 20 years ago, currently not      on maintenance therapy.  He also had an I&D of a perianal abscess.  2.  Chronic gastroesophageal reflux disease, status post Nissen      fundoplication, 3295 at [facility] by Dr. Natasha Bence.  Esophageal dilatation done at      that time.  The surgery was complicated by small-bowel obstruction      requiring lysis of adhesions two days later.  3.  History of positive hepatitis C per __________, however, he has      subsequently had a negative HCVPCR qualitative test by Dr. Antonietta Barcelona      office.  4.  Arthritis.  5.  Nephrolithiasis, status post stenting approximately four to five years      ago.  Last colonoscopy and EGD as outlined above.   FAMILY  HISTORY:  Negative for colorectal cancer.  He has three maternal  first cousins with Crohn disease.  His mother had a history of stroke and  CAD.  Father had prostate cancer.  Two brothers healthy.   SOCIAL HISTORY:  He has been married for over 15 years.  He has three  children.  He is employed full time with [employer].  He currently  does not smoke.  He smoked for 20+ years at one pack per day but quit 10  years ago.  History of daily small volume alcohol use.  No drug use.   REVIEW OF SYSTEMS:  See HPI .  Denies any weight loss.  CARDIOPULMONARY:  No  chest pain or shortness of breath.  Complains of tachycardia with fever.   PHYSICAL EXAMINATION:  VITAL SIGNS:  T-max 100.6, T-current 98.7, pulse 103,  respirations 20, blood pressure 141/82.  GENERAL:  A pleasant, well nourished, well developed, Caucasian male in no  acute distress.  He does appear to have some shortness of breath.  SKIN:  Warm and dry.  No jaundice.  HEENT:  Conjunctivae are pink.  Sclerae are nonicteric.  Oropharyngeal  mucosa moist and pink.  No lesions, erythema or exudate.  No  lymphadenopathy, thyromegaly.  CHEST:  Lungs clear to auscultation.  CARDIAC:  Reveals a regular rate and rhythm.  Normal S1 S2.  No murmurs,  rubs, or gallops.  ABDOMEN:  Positive bowel sounds.  Soft.  Mild epigastric tenderness.  Abdomen is full but soft.  No organomegaly or masses.  EXTREMITIES:  No edema.   LABORATORY:  White count 12,800, hemoglobin 13.7, hematocrit 38.8, platelets  202,000.  Glucose 128, otherwise MET-7 normal.  Total bilirubin 1.5,  indirect bilirubin 1.1, alkaline phosphatase 77, AST 32, ALT 52, albumin  3.2.  CT of the chest revealed bronchial pneumonia and emphysema.  Recent  chest x-ray revealed slight poor right lower lobe aeration suggesting slight  worsening of infiltrate.   IMPRESSION: 1.  The patient is a 60 year old Caucasian gentleman with complicated      gastroesophageal reflux disease.   2.  He also has chronic dysphagia.  3.  Current pneumonia.   He had a Nissen fundoplication in 1884 with  only six months relief of his  reflux symptoms.  He was doing well up until the point of having his proton  pump inhibitor therapy switched by his insurance company.  He was doing well  on Nexium twice a day but has now been maintained on omeprazole 20 mg twice  a day with poor control.  He reports recurrent aspiration pneumonia.  Pneumonia seems to develop after waking up at night with reflux and coughing  spells.  Would question if he is truly having aspiration pneumonia, would  question whether he has some oropharyngeal dysphagia.  He also complains of  dysphagia.  He reportedly had normal manometry prior to his fundoplication  in 7289.  Recent EGD with esophageal dilatation did not help symptoms,  although he did have a distal esophageal ring which was dilated.   PLAN:  1.  Continue Nexium 40 mg b.i.d.  2.  Aspiration precaution.  3.  We will check O2 sats given shortness of breath.  4.  If he remains stable, we will proceed with EGD in the morning.      Neil Crouch, P.ABridgette Habermann, M.D.  Electronically Signed    LL/MEDQ  D:  09/19/2005  T:  09/19/2005  Job:  [id number]   cc:   Bonnielee Haff, MD

## 2011-01-13 NOTE — Consult Note (Signed)
NAME:  Gary Vasquez, Gary Vasquez NO.:  [id number]   MEDICAL RECORD NO.:  [id number]          PATIENT TYPE:  INP   LOCATION:  A332                          FACILITY:  [facility]   PHYSICIAN:  Kofi A. Merlene Laughter, M.D. DATE OF BIRTH:  08-Feb-1951   DATE OF CONSULTATION:  09/21/2005  DATE OF DISCHARGE:                                   CONSULTATION   REFERRING PHYSICIAN:  Barbette Merino, M.D.   HISTORY OF PRESENT ILLNESS:  This is a 60 year old man who has severe  gastroesophageal reflux disease with recurrent bouts of pneumonia.  The  patient has been status post Nissen fundoplication, but this apparently  failed 6 months after with complications.  The patient had been maintained  on Nexium b.i.d., but this had to be discontinued based upon insurance  restriction.  The patient was forced to take Prilosec, but he indicated that  this does not work as well as the Nexium.  The patient has started having  subsequent to this frequent pneumonias as he apparently had one about 3  weeks ago.  The patient presented with cough and fever.  The patient  apparently developed headache starting in the occipital region the day  before he was hospitalized here on Sunday.  His headache started on  Saturday.  He indicates that he is not a headache person and does not have a  family history of migraine headaches.  Headaches have been quite severe  radiating to the frontal regions bilaterally associated with photophobia.  The patient remains still and lying down with headaches seem to be better.  He did have a spiral tap because of suspected meningitis, but the spinal tap  has been unrevealing and essentially normal.  The patient's headache has  persisted.  The medications given does help, but seemed not to last long  enough.  The patient endorses black, flashing spots sometimes and dots with  headaches and no diplopia.   PAST MEDICAL HISTORY:  1.  Severe GERD, status post Nissen fundoplication at [facility] which      worked for only 6 months.  2.  History of recurrent pneumonia due to severe reflux.  3.  History of Crohn's disease, status post multiple procedures, apparently      stable.  4.  History of arthritis which has been chronic.   ALLERGIES:  No known drug allergies.   MEDICATIONS ON ADMISSION:  1.  Prilosec 20 mg b.i.d.  2.  Darvocet.  3.  Requip for restless leg syndrome.   SOCIAL HISTORY:  Married and lives with his wife.  No alcohol or tobacco  use.  No illicit drug use.  The patient is active.   REVIEW OF SYSTEMS:  As in history of present illness, all else unrevealing.  He did have fevers and chills and these have persisted, even though he is on  antibiotics.  He was on Unasyn and this was discontinued because of possible  headaches and has recently started on Cleocin and Levaquin today.   PHYSICAL EXAMINATION:  GENERAL:  A man who seems to be in a little  discomfort  from headaches.  VITAL SIGNS:  T-max 102, latest 99.4, pulse 94, respirations 24, blood  pressure 140/82.  HEENT:  Head is normocephalic, atraumatic.  NECK:  Supple.  NEUROLOGIC:  The patient is awake and alert.  Speech, language and cognition  are intact.  Pupils are 4 mm and briskly reactive.  Extraocular movements  intact.  Facial movement is symmetric.  Tongue is midline.  Uvula is  midline.  Visual fields are intact.  Funduscopic examination was difficult,  but this showed that disc margins are sharp.  Unable to clearly see  spontaneous venous pulsations.  Motor exam shows normal tone, bulk and  strength in both the upper and lower extremities.  Coordination is intact  with no dysmetria.  Reflexes are +2 and symmetric.  Plantar reflexes are  downgoing.   LABORATORY DATA AND X-RAY FINDINGS:  The patient had a chest CT which shows  evidence of alveolar opacity centrally involving the right lung as well as  the left upper lobe.  The findings are consistent with bronchopneumonia.  There is also  evidence of emphysema.  CT scan shows no acute abnormalities.  Abdominal ultrasound shows no gallstones and liver appears normal with no  ductal dilatation.  Spinal fluid analysis with WBC in tube #1 is 1, RBC is  164; tube #4 WBC 0 and RBC 3, glucose 65, protein 32.  The most recent serum  glucose is 112, sodium 135, potassium 4, chloride 101, CO2 28.  WBC 9,  hemoglobin 14, platelet count 213.   ASSESSMENT:  Unusual headache of undetermined etiology.  Given the unusual  findings, more serious problems should be investigated.  The cerebral spinal  fluid analysis does not suggest acute bacterial meningitis and this is  unlikely.  There is also no evidence of occult subarachnoid hemorrhage.  An  arteriovenous malformation and other rare processes should be evaluated and  ruled out.   RECOMMENDATIONS:  1.  MRI and MRA of the brain.  2.  Will add Fioricet to help with headache control.   Thanks for this consultation.      Kofi A. Merlene Laughter, M.D.  Electronically Signed     KAD/MEDQ  D:  09/21/2005  T:  09/21/2005  Job:  [id number]

## 2011-01-13 NOTE — H&P (Signed)
NAMESELWYN, REASON NO.:  [id number]   MEDICAL RECORD NO.:  [id number]           PATIENT TYPE:   LOCATION:                                 FACILITY:   PHYSICIAN:  Hildred Laser, M.D.    DATE OF BIRTH:  May 20, 1951   DATE OF ADMISSION:  DATE OF DISCHARGE:  LH                                HISTORY & PHYSICAL   PRIMARY CARE PHYSICIAN:  Micah Flesher. Halm, D.O.   CHIEF COMPLAINT:  Followup of Crohn's disease.   HISTORY OF PRESENT ILLNESS:  [name] is a 59 year old Caucasian gentleman  with history of Crohn's disease status post right hemicolectomy over 30  years ago who presents today for followup. He has actually had very limited  disease. He did require a right hemicolectomy over 30 years ago and about 15  years ago had an I&D of a perianal abscess. Otherwise, he has had very few  symptoms. He has not been on any maintenance therapy because of that as  well. He was doing fairly well until about four weeks ago. He started  noticing some drainage from a site within in his prior abdominal scar. The  area became quite indurated and red and warm to the touch. It was painful.  He states that it drained quite a bit, and he had to wear a bandage for  several days. He went on antibiotics which he states was Keflex and  clindamycin. He states he was on it for a couple of weeks, and he has not  had seen any more drainage in about two weeks. This area was cultured, and  we have a preliminary culture report which states there was no growth at two  days. Swelling has completely gone down now. The patient feels back to  normal. He denies any flare of diarrhea. He tends to have about two stools a  day. No melena or rectal bleeding, abdominal pain. As long as he takes his  Nexium twice a day, he does not have any heartburn. He complains mostly  today of dysphagia. He has difficulty swallowing breads and meats. He has  had this for quite a while, even dating back before his  Nissen  fundoplication. He also has a history of multiple episodes of aspiration  pneumonia requiring hospitalization on some occasions. He states he  generally has three to four episodes a year. He wakes up in the middle of  the night with acid reflux, feels like. He has to cough and a couple of days  later develops fever and pneumonia. His last EGD and colonoscopy was in June  of 2002. This was done at [facility]. Colonoscopy revealed ileocolonic anastomosis  of the hepatic flexure. Small bowel was intubated, and there was no obvious  active disease. It was commented that the prep was poor, however. Upper  endoscopy on February 08, 2001 revealed hiatal hernia, Schatzki's ring. This was  dilated with a 17-mm Savory dilator. When we saw him in December 2004, he  had an upper GI with small-bowel follow through that revealed post surgical  changes of prior ileocolic  resection with patent ileal transverse colon  anastomosis in mid abdomen with mild duodenitis. He had deformity of the GE  junction just above a small hiatal hernia felt to be from prior antireflux  surgery. A 12-mm barium table delayed here but did eventually pass with  repeated swallows. The patient was asked to have an EGD, but he failed to  schedule this.   CURRENT MEDICATIONS:  1.  Sudafed p.r.n.  2.  Tylenol p.r.n.  3.  Darvocet-N 100 nightly p.r.n.  4.  Nexium 40 mg b.i.d.   ALLERGIES:  No known drug allergies.   PAST MEDICAL HISTORY:  1.  Crohn's disease, status post right hemicolectomy at [facility] by      Dr. Prince Solian, status post small-bowel abscess 20 years ago currently not      on maintenance therapy. He states he had an I&D of perianal abscess as      well.  2.  Chronic GERD status post Nissen fundoplication 4098 at [facility] by Dr. Natasha Bence.      He had esophageal dilatation done at that time as well. Surgery was      complicated by small-bowel obstruction requiring lysis of adhesions two      days later.  3.  History of  positive hepatitis C per red cross. However, this was      subsequently followed by negative HCV-PCR qualitative test by Dr. Antonietta Barcelona      office.  4.  Arthritis.  5.  Nephrolithiasis status post stenting approximately four to five years      ago.  6.  Last colonoscopy at [facility] in 2002 with poor prep but no evidence of active      disease or polyps.   FAMILY HISTORY:  Negative for colorectal cancer. He has three maternal first  cousins with Crohn's disease. His mother had a history of CVA and CAD.  Father had prostate cancer. Two brothers are healthy.   SOCIAL HISTORY:  He has been married for over 15 years. He has three  children. He is employed full time with [employer]. He currently  does not smoke. He has a 20+ year history of smoking one pack per day and  quitting about 10 years ago. History of daily small volume alcohol use.  Denies any drug use.   REVIEW OF SYSTEMS:  See HPI for GI. CONSTITUTIONAL:  Denies weight loss.  CARDIOPULMONARY:  Denies chest pain or shortness of breath.   PHYSICAL EXAMINATION:  VITAL SIGNS:  Weight 202 - stable. Temperature 98.1,  blood pressure 156/96, pulse 78.  GENERAL:  Pleasant, well-developed, well-nourished, Caucasian male in no  acute distress.  SKIN:  Warm and dry. No jaundice.  HEENT:  Conjunctivae are pink. Sclerae are nonicteric. Oropharyngeal mucosa  moist and pink. No lesions, erythema, or exudate. No lymphadenopathy or  thyromegaly.  CHEST:  Lungs are clear to auscultation.  CARDIAC:  Reveals regular rate and rhythm. Normal S1 and S2. No murmurs,  rubs, or gallops.  ABDOMEN:  Positive bowel sounds. Soft. There are no abdominal bruits or  hernias. He has a vertical scar in the right lower quadrant. There is  approximately 2 to 3 mm size eschar with some mild induration around this  area but active drainage. The patient was examined by Dr. Laural Golden as well.  EXTREMITIES:  No edema.   IMPRESSION: 1.  Crohn's disease status post  right hemicolectomy as outlined above.      Recently, the patient had draining from his  prior swelling and drainage      for a couple of weeks, and this is now resolved after taking      approximately three weeks of antibiotic therapy. He had not had any      imaging study. Need to consider possibility of sinus track formation.      Very unlikely that this is a fistula between the bowel and skin given      that appears to have healed. The patient denies any trauma to the area.      Cannot exclude the possibility.  2.  Complicated gastroesophageal reflux disease with chronic dysphagia. He      describes having a normal manometry prior to his Nissen fundoplication.      At that time, he also had Schatzki's ring which was dilated. He is      having difficulties with breads and meats. He has had aspiration      pneumonia. Barium pill esophagogram about two years ago revealed      narrowing of the GE junction. Recommend EGD at this time for therapeutic      measures.   PLAN:  EGD along with colonoscopy in the near future. Not mentioned above,  the patient's last TCS was four years ago and he had poor prep at that time.  He desires proceeding with colonoscopy at this time which is reasonable. I  discussed risks, alternatives, and benefits of EGD and colonoscopy with the  patient with regards to but not limited to the risk of medication reaction,  bleeding, infection, or perforation. He is agreeable to proceed. Dr. Laural Golden  is in agreement with this plan as well.      Neil Crouch, P.A.      Hildred Laser, M.D.  Electronically Signed    LL/MEDQ  D:  04/13/2005  T:  04/13/2005  Job:  [id number]   cc:   Micah Flesher. Wray Kearns  Fax: [phone]

## 2011-01-13 NOTE — Group Therapy Note (Signed)
NAME:  Gary Vasquez, RESOR NO.:  [id number]   MEDICAL RECORD NO.:  [id number]          PATIENT TYPE:  INP   LOCATION:  R518                          FACILITY:  [facility]   PHYSICIAN:  Audria Nine, M.D.DATE OF BIRTH:  [birth year]   DATE OF PROCEDURE:  09/22/2005  DATE OF DISCHARGE:                                   PROGRESS NOTE   SUBJECTIVE:  The patient feels much better today.  He says his headache is  remarkably better.  He denies any fever or chills.  He has no chest pain.  Cough is very minimal with minimal sputum production.  We appreciate  continued gastrointestinal and neurology input.  Workup so far for his  headache has been negative.   The patient had an EGD this morning which showed erosions consistent with  erosive reflux esophagitis.  It was recommended that patient continue on  Nexium 40 mg p.o. b.i.d.   OBJECTIVE:  Conscious, alert, comfortable, not in acute distress, pleasant.  VITAL SIGNS:  His blood pressure was 142/79, pulse of 105, respirations of  24, T-max was 99.2.  Oxygen saturation was 94% on room air.  HEENT:  Exam was normocephalic, atraumatic. Oral mucosa was moist.  No  exudate was noted.  NECK:  Supple, no JVD, no lymphadenopathy.  LUNGS:  Crackles were noted at right base.  HEART:  S1, S2 regular, no S3, S4, gallops or rubs.  ABDOMEN:  Soft, nontender, bowel sounds were positive, no muscles palpable.  EXTREMITIES:  No pitting edema, no calf induration or tenderness was noted.   LABORATORY DATA:  White blood cell count was 8.2, hemoglobin of 13.8,  hematocrit 39.6, platelet count was 237.  There was no left shift.  Sodium  was 135, potassium 3.5, chloride of 103, CO2 25, glucose 118, BUN of 12,  creatinine 0.9.  Total bilirubin was 0.7, AST 24, ALT of 41, total protein  of 6.3, albumin was 2.5, calcium of 8.1.  CSF analysis was negative.   ASSESSMENT AND PLAN:  1.  Right lobar pneumonia, possible secondary to aspiration.  The  patient's      fever is better.  He is now running a fairly low-grade fever.  Zosyn was      discontinued for Levaquin and clindamycin after the patient complained      of headache.  His white blood cell count has improved.  I anticipate      that we may be able to discharge him home on oral antibiotics tomorrow.  2.  Severe reflux esophagitis, confirmed on EGD ,on Nexium 40 mg p.o. b.i.d.      We appreciate Dr. Roseanne Kaufman input.  3.  Headache, unclear etiology, probably as part of his infectious process.      Extensive workup has been done with no positive report.  We will      continue to give him analgesia p.r.n.  4.  Crohn's disease, stable.  The patient has no abdominal pain.  He is not      on any medications.  5.  Restless leg syndrome.  This is stable.  He will continue on Requip.   DISPOSITION:  Patient likely to be discharged home in the next 1-2 days.  The patient will need followup with surgeon for  possible Nissen  fundoplication.      Audria Nine, M.D.  Electronically Signed     AM/MEDQ  D:  09/22/2005  T:  09/22/2005  Job:  [id number]

## 2011-01-13 NOTE — Op Note (Signed)
NAME:  Gary Vasquez, Gary Vasquez NO.:  [id number]   MEDICAL RECORD NO.:  [id number]          PATIENT TYPE:  INP   LOCATION:  R320                          FACILITY:  [facility]   PHYSICIAN:  R. Garfield Cornea, M.D. DATE OF BIRTH:  04-Jun-1951   DATE OF PROCEDURE:  09/22/2005  DATE OF DISCHARGE:                                 OPERATIVE REPORT   PROCEDURE:  Diagnostic esophagogastroduodenoscopy.   INDICATIONS FOR PROCEDURE:  The patient is a 60 year old gentleman with  refractory reflux symptoms, some dysphagia, recurring regurgitation who has  been admitted to the hospital with recurrent aspiration pneumonia. His  pulmonary status has improved to the point where EGD can be performed. This  approach has been discussed with the patient at length. Potential risks,  benefits, and alternatives have been reviewed and questions answered. He is  agreeable. Please see documentation in the medical record.   PROCEDURE NOTE:  O2 saturation, blood pressure, pulse, and respirations were  monitored throughout the entire procedure. Conscious sedation with Versed 5  mg IV and Demerol 100 mg IV in divided doses.  Cetacaine spray for topical  oropharyngeal anesthesia.   INSTRUMENT:  Olympus video chip system.   FINDINGS:  Examination of the tubular esophagus revealed a noncritical  Schatzki's ring and distal esophageal erosions. There was about a 1-cm patch  of salmon-colored epithelium on a ledge just above the squamocolumnar  junction with a few millimeters of normal appearing esophageal mucosal  between this patch and the squamocolumnar junction. Please see photos. There  was no evidence of tumor or varices. EG junction easily traversed.   Stomach:  Gastric cavity was empty and insufflated well with air. Thorough  examination of gastric mucosa including retroflexed view of the proximal  stomach and esophagogastric junction demonstrated only a moderate-sized  hiatal hernia and some antral  erosions. Pylorus patent and easily traversed.  Examination of bulb and second portion revealed bulbar erosions. Otherwise,  D1 and D2 appeared normal.   This gentleman was somewhat combative during any manipulation during the  procedure although otherwise he had very good conscious sedation. I  attempted to pass a 56-French Maloney dilator, and he was not tolerant at  all with anything larger than the diameter of the scope in the back of his  throat. Subsequently, I went back and looked at the esophagus again, and  because of some angulation just above the EG junction, I could never get  another good look at the patch of salmon-colored epithelium. This was seen  very well initially when I took the photographs as the patient belched.  Because of angulation and poor patient cooperation, I was not able to get  back at it. I subsequently elected to terminate the procedure. There were no  complications associated with the procedure.   IMPRESSION:  1.  Patchy salmon-colored epithelium in distal esophagus, not biopsied.  2.  Noncritical Schatzki's ring, not manipulated. Distal esophageal erosions      consistent with erosive reflux esophagitis.  3.  Moderate-sized hiatal hernia, antral erosions, otherwise normal gastric      mucosa. Patent pylorus.  Bulbar erosions. Otherwise normal D1 and D2.   RECOMMENDATIONS:  1.  The patient really has gone down hill since going from Nexium b.i.d. to      omeprazole. He is now back on Nexium 40 mg orally b.i.d. I suggest this      regimen be continued without substitution indefinitely.  2.  Anti-reflux measures emphasized in detail to Gary Vasquez.  3.  Will arrange followup appointment with Dr. Laural Golden in the coming next      couple of months. Will give some consideration to a repeat EGD under      more elective circumstances later in the year.  4.  Draw Helicobacter pylori serologies.   Dr. Stann Mainland will be seeing Gary Vasquez over the weekend as needed in my  absence.      Bridgette Habermann, M.D.  Electronically Signed     RMR/MEDQ  D:  09/22/2005  T:  09/22/2005  Job:  [id number]

## 2011-01-13 NOTE — Op Note (Signed)
NAME:  Gary Vasquez, Gary Vasquez NO.:  [id number]   MEDICAL RECORD NO.:  [id number]          PATIENT TYPE:  AMB   LOCATION:  DAY                           FACILITY:  [facility]   PHYSICIAN:  Hildred Laser, M.D.    DATE OF BIRTH:  1951/05/08   DATE OF PROCEDURE:  04/25/2005  DATE OF DISCHARGE:                                 OPERATIVE REPORT   PROCEDURE:  Esophagogastroduodenoscopy with esophageal dilation followed by  colonoscopy and terminal ileoscopy.   INDICATION:  Gary Vasquez is a 60 year old Caucasian male with history of GERD who is  presently on double dose PPI who complains of intermittent solid food  dysphagia. He has history of hiatal hernia and ring and his last EGD was at  [facility]. He is also status post laparoscopic Nissen in 2002. He is  undergoing colonoscopy both for diagnostic and screening purposes. He has  history of Crohn's disease but presently not on maintenance therapy. He had  right hemicolectomy about 20 years ago. He recently had skin infection at  abdominal scar which has responded to antibiotic therapy. Since this has  healed completely, we doubt that it is the result of Crohn disease.   Both the procedure and risks were reviewed with the patient, and informed  consent was obtained.   PREMEDICATION:  Cetacaine spray for pharyngeal topical anesthesia, Demerol  50 mg IV, Versed 10 mg IV.   FINDINGS:  Procedure was performed in endoscopy suite. The patient's vital  signs and O2 saturation were monitored during the procedure and remained  stable.   PROCEDURE #1:  Esophagogastroduodenoscopy. The patient was placed in left  lateral position and Olympus videoscope was passed via oropharynx without  any difficulty into esophagus.   Esophagus. Mucosa of the esophagus was normal throughout. GE junction was  located at 39 cm from the incisors with a ring which was not critical. A 2-  cm size hiatal hernia was noted. Hiatus was at 41 cm.   Stomach. It was  empty and distended very well insufflation. Folds of  proximal stomach were normal. Examination mucosa at body, antrum, pyloric  channel as well as angularis, fundus and cardia was normal. No wrap was  obvious implying that it has spontaneously undone itself.   Duodenum. Bulbar mucosa was normal. Scope was passed to the second part of  duodenum where mucosa and folds were normal. Endoscope was withdrawn.   Esophagus was dilated by passing 52- and 54-French Maloney dilators.  Endoscope was passed again and esophagus reexamined. There was a small tear  at GE junction, resulting in disruption of this ring. Endoscope was  withdrawn. The patient prepared for colonoscopy.   PROCEDURE #2:  Colonoscopy. Rectal examination performed. No abnormality  noted on external or digital exam. Olympus videoscope was placed in rectum  and advanced under vision into sigmoid colon and beyond. Preparation was  excellent. Scope was passed to the area of hepatic flexure where the ileal  colonic anastomosis was identified. It was patent. There was a single  erosion which was biopsied, and biopsies were also taken from surrounding  mucosa. Scope  was passed into neoterminal ileum for at least 20 cm and was  normal. Pictures taken for the record. Colonic mucosa was examined on the  way out and was normal throughout. Rectal mucosa similarly was normal. Scope  was retroflexed to examine anorectal junction, and similar single hemorrhoid  was noted below the dentate line. Endoscope was straightened and withdrawn.  The patient tolerated the procedures well.   FINAL DIAGNOSIS:  1.  Distal esophageal ring which was disrupted by passing 52- and 54-French      Maloney dilators.  2.  Small sliding hiatal hernia.  3.  Fundal wrap has undone itself.  4.  No evidence of peptic ulcer disease.  5.  Normal colonoscopy.  6.  Single erosion at the ileal colonic anastomosis. Biopsy taken from this      area. Neoterminal ileum for  at least 20 cm were normal.  7.  As far as his Crohn's disease is concerned, he appears to be in      remission.   RECOMMENDATIONS:  Will continue anti-reflux measures and Nexium as before.   I will contact the patient with biopsy results. Whether not he should be on  maintenance therapy will be deferred until histology available for review.      Hildred Laser, M.D.  Electronically Signed     NR/MEDQ  D:  04/25/2005  T:  04/25/2005  Job:  [id number]   cc:   Micah Flesher. Wray Kearns  Fax: [phone]

## 2011-01-13 NOTE — Discharge Summary (Signed)
Gary Vasquez, Gary Vasquez NO.:  [id number]   MEDICAL RECORD NO.:  [id number]          PATIENT TYPE:  INP   LOCATION:  A332                          FACILITY:  [facility]   PHYSICIAN:  Audria Nine, M.D.DATE OF BIRTH:  July 29, 1951   DATE OF ADMISSION:  09/19/2005  DATE OF DISCHARGE:  01/28/2007LH                                 DISCHARGE SUMMARY   DISCHARGE DIAGNOSES:  1.  Pneumonia, possibly aspiration. The patient has had recurrent history.  2.  Severe gastroesophageal reflux disease with erosive esophagitis and      gastritis seen on EGD.   DISCHARGE MEDICATIONS:  1.  Levaquin 750 mg p.o. once a day for seven more days.  2.  Clindamycin 450 mg p.o. b.i.d. for five more days, then stop.  3.  Nexium 40 mg p.o. b.i.d.  4.  Requip 3 mg p.o. q.h.s.   SPECIAL INSTRUCTIONS:  The patient is advised to follow up with the surgeon  for possible Nissen fundoplication in the nearest future. The patient has  had recurrent pneumonias as a result of this.   DIET:  Regular diet.   ACTIVITY:  As tolerated.  Gastroesophageal reflux disease precautions was  discussed with the patient.   FOLLOWUP:  1.  The patient is to follow up with a CT scan for a left upper nodule that      was discovered accidentally.  2.  Follow up with Dr. Gala Romney. The patient will call the office in the next      day or two.  3.  With his primary care physician in three to four weeks.   CONSULTATIONS:  1.  Dr. Gala Romney, gastroenterology for severe gastroesophageal reflux disease.  2.  Neurology with Dr. Trey Sailors A. [place] for headache.   PROCEDURE PERFORMED:  1.  Esophagogastroduodenoscopy performed on September 22, 2005.  2.  Lumbar puncture.  3.  Esophagogastroduodenoscopy.  4.  MRI/MRA of the brain.  5.  Ultrasound of the abdomen.  6.  CT scan of the head.   PERTINENT LABORATORY DATA:  MRI and MRA of the brain done on September 21, 2005, showed no acute abnormalities.   Ultrasound of the abdomen was  also normal. CT of the head on September 20, 2005, was normal.   Esophagogastroduodenoscopy performed on September 22, 2005, by Dr. Gala Romney  revealed patchy salmon colored epithelium in the distal esophagus. There  were some erosions consistent with erosive reflux esophagitis. The patient  had some hiatal hernia and some antral erosions.   CT scan of his chest showed multilobar pneumonia.   WBC on admission revealed white blood cell count of 12.9, hemoglobin 15.0,  hematocrit 43.4, platelet count 187,000, neutrophils 89%. Sodium 131,  potassium 4.1, chloride 102, CO2 24, glucose 156, BUN 14, creatinine 1.1,  calcium 9.   HOSPITAL COURSE:  Gary Vasquez is a 60 year old Caucasian male with severe  gastroesophageal reflux disease and Crohn's disease. The patient has had a  Nissen fundoplication in the past at [facility], but this apparently  failed as the patient has had severe reflux symptoms and recurrent  pneumonias.  He was admitted again with what appeared to be recurrent  pneumonias. This was confirmed on CT scan as the patient had bilateral  opacities.   Also on incidental finding of a 4-mm nodular opacity in the posterior left  upper lobe.  This was thought to be possibly post inflammatory.  Follow-up  CT scan was recommended. The patient was initially started on Zosyn, however  he developed severe headache which was extensively worked up including an  MRI, MRA, CT scan as mentioned above with no evidence of abnormality. A  lumbar puncture was also performed on him which was negative.   His Zosyn was discontinued and he was placed on Levaquin 500 mg once a day  IV and clindamycin 200 mg IV q.6h. The patient has had progressive  improvement, although he did have occasional fever spikes. When I saw him  today, September 24, 2005, he had been afebrile for about 24 hours, his white  count was normal, and the patient was looking forward to going home. His  reflux symptoms had also  improved. The patient will likely have a second  opinion about possible reflux surgery at the [facility]:  The patient is being discharged in satisfactory condition.      Audria Nine, M.D.  Electronically Signed     AM/MEDQ  D:  09/24/2005  T:  09/24/2005  Job:  [id number]

## 2011-05-19 LAB — BASIC METABOLIC PANEL
BUN: 16
BUN: 20
CO2: 27
Calcium: 8.1 — ABNORMAL LOW
Chloride: 100
Chloride: 104
Creatinine, Ser: 1.03
Creatinine, Ser: 1.12
GFR calc Af Amer: >60
Glucose, Bld: 152 — ABNORMAL HIGH
Potassium: 4.8

## 2011-05-19 LAB — HEPATIC FUNCTION PANEL
Bilirubin, Direct: 0.2
Total Bilirubin: 0.9

## 2011-05-19 LAB — CBC
HCT: 42
Hemoglobin: 13.2
MCHC: 33.4
MCV: 88.4
Platelets: 197
RBC: 4.34
RBC: 4.41
RDW: 13.3
RDW: 13.5
WBC: 14.8 — ABNORMAL HIGH

## 2011-05-19 LAB — HEMOGLOBIN AND HEMATOCRIT, BLOOD: Hemoglobin: 15.6

## 2011-05-19 LAB — LIPASE, BLOOD: Lipase: 76 — ABNORMAL HIGH

## 2011-05-19 LAB — CREATININE, SERUM
Creatinine, Ser: 0.94
GFR calc Af Amer: >60
GFR calc non Af Amer: >60

## 2011-05-19 LAB — AMYLASE, BODY FLUID: Amylase, Fluid: 5538

## 2011-05-22 LAB — COMPREHENSIVE METABOLIC PANEL
ALT: 24
ALT: 31
ALT: 31
ALT: 50
ALT: 14
ALT: 15
ALT: 24
ALT: 28
AST: 23
AST: 28
AST: 15
AST: 19
AST: 21
AST: 25
Albumin: 2.5 — ABNORMAL LOW
Albumin: 2.6 — ABNORMAL LOW
Albumin: 2.6 — ABNORMAL LOW
Albumin: 1.6 — ABNORMAL LOW
Albumin: 2.4 — ABNORMAL LOW
Alkaline Phosphatase: 102
Alkaline Phosphatase: 78
Alkaline Phosphatase: 86
Alkaline Phosphatase: 53
Alkaline Phosphatase: 69
Alkaline Phosphatase: 72
Alkaline Phosphatase: 82
BUN: 20
BUN: 26 — ABNORMAL HIGH
BUN: 33 — ABNORMAL HIGH
BUN: 45 — ABNORMAL HIGH
BUN: 14
BUN: 19
BUN: 19
BUN: 22
BUN: 29 — ABNORMAL HIGH
CO2: 27
CO2: 28
CO2: 29
CO2: 26
CO2: 26
CO2: 27
CO2: 28
CO2: 28
CO2: 29
Calcium: 8.5
Calcium: 8.6
Calcium: 8.6
Calcium: 8.9
Calcium: 7.6 — ABNORMAL LOW
Calcium: 7.7 — ABNORMAL LOW
Calcium: 7.8 — ABNORMAL LOW
Calcium: 8 — ABNORMAL LOW
Chloride: 106
Chloride: 107
Chloride: 104
Chloride: 107
Chloride: 108
Chloride: 98
Creatinine, Ser: 0.82
Creatinine, Ser: 0.91
Creatinine, Ser: 0.69
Creatinine, Ser: 0.71
GFR calc Af Amer: >60
GFR calc Af Amer: >60
GFR calc Af Amer: >60
GFR calc Af Amer: >60
GFR calc Af Amer: >60
GFR calc Af Amer: >60
GFR calc non Af Amer: >60
GFR calc non Af Amer: >60
GFR calc non Af Amer: >60
GFR calc non Af Amer: >60
GFR calc non Af Amer: >60
GFR calc non Af Amer: >60
Glucose, Bld: 123 — ABNORMAL HIGH
Glucose, Bld: 123 — ABNORMAL HIGH
Glucose, Bld: 134 — ABNORMAL HIGH
Glucose, Bld: 137 — ABNORMAL HIGH
Glucose, Bld: 164 — ABNORMAL HIGH
Glucose, Bld: 101 — ABNORMAL HIGH
Glucose, Bld: 114 — ABNORMAL HIGH
Glucose, Bld: 138 — ABNORMAL HIGH
Glucose, Bld: 165 — ABNORMAL HIGH
Glucose, Bld: 75
Glucose, Bld: 85
Potassium: 3.9
Potassium: 4
Potassium: 3.8
Potassium: 4
Potassium: 4.2
Potassium: 4.4
Sodium: 135
Sodium: 142
Sodium: 142
Sodium: 143
Sodium: 135
Sodium: 141
Sodium: 142
Sodium: 144
Total Bilirubin: 1
Total Bilirubin: 1.3 — ABNORMAL HIGH
Total Bilirubin: 0.4
Total Bilirubin: 0.5
Total Bilirubin: 0.7
Total Bilirubin: 0.9
Total Bilirubin: 1.1
Total Protein: 6.3
Total Protein: 6.5
Total Protein: 6.7
Total Protein: 6.9
Total Protein: 4.6 — ABNORMAL LOW
Total Protein: 4.8 — ABNORMAL LOW
Total Protein: 5.2 — ABNORMAL LOW
Total Protein: 6.1

## 2011-05-22 LAB — BASIC METABOLIC PANEL
BUN: 22
BUN: 23
BUN: 29 — ABNORMAL HIGH
BUN: 13
BUN: 15
CO2: 26
CO2: 27
CO2: 28
CO2: 29
CO2: 27
CO2: 27
CO2: 27
Calcium: 8.5
Calcium: 8.5
Calcium: 9.3
Calcium: 7.6 — ABNORMAL LOW
Calcium: 7.6 — ABNORMAL LOW
Calcium: 8.1 — ABNORMAL LOW
Calcium: 8.2 — ABNORMAL LOW
Calcium: 8.2 — ABNORMAL LOW
Chloride: 103
Chloride: 104
Chloride: 106
Chloride: 106
Creatinine, Ser: 0.97
Creatinine, Ser: 1.03
Creatinine, Ser: 1.04
Creatinine, Ser: 0.69
Creatinine, Ser: 0.75
GFR calc Af Amer: >60
GFR calc Af Amer: >60
GFR calc Af Amer: >60
GFR calc Af Amer: >60
GFR calc Af Amer: >60
GFR calc Af Amer: >60
GFR calc Af Amer: >60
GFR calc Af Amer: >60
GFR calc non Af Amer: >60
GFR calc non Af Amer: >60
GFR calc non Af Amer: >60
GFR calc non Af Amer: >60
GFR calc non Af Amer: >60
Glucose, Bld: 123 — ABNORMAL HIGH
Glucose, Bld: 143 — ABNORMAL HIGH
Glucose, Bld: 135 — ABNORMAL HIGH
Glucose, Bld: 73
Potassium: 4.3
Sodium: 136
Sodium: 140
Sodium: 133 — ABNORMAL LOW
Sodium: 139
Sodium: 140

## 2011-05-22 LAB — CULTURE, ROUTINE-ABSCESS: Culture: NO GROWTH

## 2011-05-22 LAB — CBC
HCT: 39.4
HCT: 39.7
HCT: 41.8
HCT: 45
HCT: 30 — ABNORMAL LOW
HCT: 34.1 — ABNORMAL LOW
HCT: 35.2 — ABNORMAL LOW
HCT: 36.3 — ABNORMAL LOW
HCT: 39.4
Hemoglobin: 12.7 — ABNORMAL LOW
Hemoglobin: 13.3
Hemoglobin: 13.7
Hemoglobin: 13.7
Hemoglobin: 13.9
Hemoglobin: 14.2
Hemoglobin: 14.5
Hemoglobin: 15
Hemoglobin: 15.6
Hemoglobin: 10.3 — ABNORMAL LOW
Hemoglobin: 10.4 — ABNORMAL LOW
Hemoglobin: 10.4 — ABNORMAL LOW
Hemoglobin: 10.5 — ABNORMAL LOW
Hemoglobin: 11.8 — ABNORMAL LOW
Hemoglobin: 12.1 — ABNORMAL LOW
Hemoglobin: 12.2 — ABNORMAL LOW
Hemoglobin: 13.8
Hemoglobin: 9.6 — ABNORMAL LOW
MCHC: 34.6
MCHC: 34.6
MCHC: 34.7
MCHC: 34.7
MCHC: 34.8
MCHC: 34.8
MCHC: 34.9
MCHC: 34.9
MCHC: 35
MCHC: 35.2
MCHC: 34.3
MCHC: 34.4
MCHC: 34.5
MCHC: 34.6
MCHC: 34.7
MCHC: 34.8
MCHC: 35
MCHC: 35.7
MCV: 86.3
MCV: 87.5
MCV: 87.6
MCV: 87.6
MCV: 87.9
MCV: 88.3
MCV: 84
MCV: 85.9
MCV: 86.4
MCV: 86.6
MCV: 88.3
Platelets: 220
Platelets: 281
Platelets: 300
Platelets: 310
Platelets: 335
Platelets: 209
Platelets: 216
Platelets: 225
Platelets: 240
Platelets: 249
Platelets: 260
RBC: 4.21 — ABNORMAL LOW
RBC: 4.29
RBC: 4.36
RBC: 4.46
RBC: 4.46
RBC: 4.49
RBC: 4.94
RBC: 3.22 — ABNORMAL LOW
RBC: 3.35 — ABNORMAL LOW
RBC: 3.48 — ABNORMAL LOW
RBC: 3.51 — ABNORMAL LOW
RBC: 3.57 — ABNORMAL LOW
RBC: 3.95 — ABNORMAL LOW
RBC: 4.1 — ABNORMAL LOW
RBC: 4.18 — ABNORMAL LOW
RDW: 13.1
RDW: 13.3
RDW: 13.5
RDW: 13.6
RDW: 13.9
RDW: 13.9
RDW: 13.9
RDW: 13.9
RDW: 14
RDW: 14
RDW: 14.2
RDW: 14.2
RDW: 14.3
RDW: 14.5
WBC: 14.4 — ABNORMAL HIGH
WBC: 17.7 — ABNORMAL HIGH
WBC: 18.5 — ABNORMAL HIGH
WBC: 20.7 — ABNORMAL HIGH
WBC: 21.6 — ABNORMAL HIGH
WBC: 10.5
WBC: 10.6 — ABNORMAL HIGH
WBC: 11.4 — ABNORMAL HIGH
WBC: 11.4 — ABNORMAL HIGH
WBC: 12.7 — ABNORMAL HIGH
WBC: 14.8 — ABNORMAL HIGH
WBC: 19.9 — ABNORMAL HIGH

## 2011-05-22 LAB — CHOLESTEROL, TOTAL
Cholesterol: 116
Cholesterol: 180
Cholesterol: 106
Cholesterol: 54

## 2011-05-22 LAB — CREATININE, SERUM
Creatinine, Ser: 0.89
GFR calc Af Amer: >60

## 2011-05-22 LAB — AMYLASE, BODY FLUID
Amylase, Fluid: 32
Amylase, Fluid: 56

## 2011-05-22 LAB — CULTURE, BLOOD (ROUTINE X 2)

## 2011-05-22 LAB — TRIGLYCERIDES
Triglycerides: 117
Triglycerides: 121
Triglycerides: 94
Triglycerides: 136
Triglycerides: 141
Triglycerides: 158 — ABNORMAL HIGH
Triglycerides: 93

## 2011-05-22 LAB — DIFFERENTIAL
Basophils Absolute: 0
Basophils Relative: 1
Basophils Relative: 1
Eosinophils Absolute: 0
Eosinophils Absolute: 0
Eosinophils Absolute: 0
Eosinophils Relative: 0
Eosinophils Relative: 0
Eosinophils Relative: 1
Lymphocytes Relative: 5 — ABNORMAL LOW
Lymphocytes Relative: 8 — ABNORMAL LOW
Lymphocytes Relative: 12
Lymphocytes Relative: 15
Lymphs Abs: 0.8
Lymphs Abs: 1.2
Lymphs Abs: 2.3
Lymphs Abs: 1.4
Lymphs Abs: 1.9
Monocytes Absolute: 1.2 — ABNORMAL HIGH
Monocytes Absolute: 0.7
Monocytes Absolute: 1
Monocytes Relative: 5
Monocytes Relative: 8
Monocytes Relative: 6
Monocytes Relative: 7
Monocytes Relative: 9
Neutro Abs: 11.5 — ABNORMAL HIGH
Neutro Abs: 17.4 — ABNORMAL HIGH
Neutro Abs: 7.6
Neutro Abs: 8.9 — ABNORMAL HIGH
Neutrophils Relative %: 80 — ABNORMAL HIGH
Neutrophils Relative %: 83 — ABNORMAL HIGH
Neutrophils Relative %: 78 — ABNORMAL HIGH
Neutrophils Relative %: 81 — ABNORMAL HIGH

## 2011-05-22 LAB — PHOSPHORUS
Phosphorus: 2.8
Phosphorus: 3.3
Phosphorus: 3.6
Phosphorus: 4.2
Phosphorus: 2.6
Phosphorus: 2.9
Phosphorus: 4
Phosphorus: 4.4

## 2011-05-22 LAB — CARDIAC PANEL(CRET KIN+CKTOT+MB+TROPI)
CK, MB: 3.5
Relative Index: 2.4
Troponin I: 0.05

## 2011-05-22 LAB — MAGNESIUM
Magnesium: 2.3
Magnesium: 2.3
Magnesium: 2.3
Magnesium: 2.5
Magnesium: 1.9
Magnesium: 2.1
Magnesium: 2.1
Magnesium: 2.3

## 2011-05-22 LAB — ANAEROBIC CULTURE

## 2011-05-22 LAB — PREALBUMIN
Prealbumin: 11.6 — ABNORMAL LOW
Prealbumin: 16.3 — ABNORMAL LOW
Prealbumin: 24.5

## 2011-05-22 LAB — POTASSIUM: Potassium: 3.8

## 2011-05-22 LAB — URINE CULTURE
Colony Count: 1000
Special Requests: NEGATIVE

## 2011-05-23 LAB — CBC
HCT: 30 — ABNORMAL LOW
HCT: 30.1 — ABNORMAL LOW
HCT: 28 — ABNORMAL LOW
Hemoglobin: 10.2 — ABNORMAL LOW
Hemoglobin: 10.4 — ABNORMAL LOW
Hemoglobin: 10.5 — ABNORMAL LOW
Hemoglobin: 10.9 — ABNORMAL LOW
Hemoglobin: 11.5 — ABNORMAL LOW
MCHC: 34.1
MCHC: 35
MCHC: 35.1
MCHC: 35.1
MCHC: 33.4
MCHC: 33.6
MCV: 83.3
MCV: 84.1
MCV: 84.3
MCV: 84.5
MCV: 83.7
Platelets: 212
Platelets: 252
Platelets: 226
RBC: 3.38 — ABNORMAL LOW
RBC: 3.54 — ABNORMAL LOW
RBC: 3.56 — ABNORMAL LOW
RBC: 3.7 — ABNORMAL LOW
RBC: 3.61 — ABNORMAL LOW
RBC: 4.15 — ABNORMAL LOW
RDW: 15
RDW: 15
RDW: 14.7
WBC: 5.9
WBC: 7.5
WBC: 7.3

## 2011-05-23 LAB — DIFFERENTIAL
Basophils Relative: 1
Basophils Relative: 1
Basophils Relative: 0
Eosinophils Absolute: 0
Eosinophils Absolute: 0
Eosinophils Absolute: 0
Eosinophils Relative: 0
Lymphocytes Relative: 28
Lymphs Abs: 1.8
Monocytes Absolute: 0.5
Monocytes Absolute: 0.6
Monocytes Absolute: 0.6
Monocytes Relative: 10
Monocytes Relative: 9
Monocytes Relative: 9
Neutro Abs: 4.2
Neutrophils Relative %: 67
Neutrophils Relative %: 63
Neutrophils Relative %: 77

## 2011-05-23 LAB — COMPREHENSIVE METABOLIC PANEL
ALT: 14
ALT: 14
ALT: 17
ALT: 42
ALT: 44
AST: 16
AST: 30
Albumin: 2.3 — ABNORMAL LOW
Albumin: 2.5 — ABNORMAL LOW
Albumin: 2.4 — ABNORMAL LOW
Albumin: 2.5 — ABNORMAL LOW
Albumin: 2.6 — ABNORMAL LOW
Alkaline Phosphatase: 76
Alkaline Phosphatase: 79
Alkaline Phosphatase: 93
Alkaline Phosphatase: 105
Alkaline Phosphatase: 87
BUN: 20
BUN: 6
CO2: 27
CO2: 28
CO2: 29
CO2: 33 — ABNORMAL HIGH
Calcium: 8.6
Calcium: 8.3 — ABNORMAL LOW
Calcium: 8.6
Calcium: 8.7
Chloride: 111
Chloride: 99
Creatinine, Ser: 0.79
Creatinine, Ser: 0.77
Creatinine, Ser: 0.83
GFR calc Af Amer: >60
GFR calc Af Amer: >60
GFR calc Af Amer: >60
GFR calc non Af Amer: >60
GFR calc non Af Amer: 59 — ABNORMAL LOW
GFR calc non Af Amer: >60
GFR calc non Af Amer: >60
Glucose, Bld: 124 — ABNORMAL HIGH
Glucose, Bld: 72
Glucose, Bld: 84
Glucose, Bld: 93
Glucose, Bld: 87
Potassium: 4.2
Potassium: 4.3
Potassium: 4.3
Potassium: 3.5
Potassium: 3.6
Sodium: 133 — ABNORMAL LOW
Sodium: 138
Sodium: 141
Sodium: 135
Sodium: 136
Sodium: 138
Total Bilirubin: 0.6
Total Bilirubin: 0.5
Total Protein: 5.6 — ABNORMAL LOW
Total Protein: 6.3
Total Protein: 6.6
Total Protein: 5.8 — ABNORMAL LOW

## 2011-05-23 LAB — BASIC METABOLIC PANEL
BUN: 18
BUN: 20
CO2: 27
CO2: 28
Calcium: 8.2 — ABNORMAL LOW
Calcium: 8.5
Calcium: 8.7
Chloride: 104
Chloride: 107
GFR calc Af Amer: >60
GFR calc Af Amer: >60
GFR calc Af Amer: >60
GFR calc Af Amer: >60
GFR calc Af Amer: >60
GFR calc non Af Amer: >60
GFR calc non Af Amer: >60
GFR calc non Af Amer: >60
GFR calc non Af Amer: >60
Glucose, Bld: 131 — ABNORMAL HIGH
Glucose, Bld: 72
Glucose, Bld: 91
Potassium: 4.3
Sodium: 136
Sodium: 138
Sodium: 138
Sodium: 139

## 2011-05-23 LAB — TYPE AND SCREEN

## 2011-05-23 LAB — PHOSPHORUS
Phosphorus: 3.9
Phosphorus: 4.5
Phosphorus: 4.5

## 2011-05-23 LAB — PREALBUMIN
Prealbumin: 14.6 — ABNORMAL LOW
Prealbumin: 14.2 — ABNORMAL LOW

## 2011-05-23 LAB — CHOLESTEROL, TOTAL: Cholesterol: 113

## 2011-05-23 LAB — TRIGLYCERIDES: Triglycerides: 126

## 2011-05-23 LAB — PROTIME-INR: INR: 1.1

## 2011-05-23 LAB — MAGNESIUM: Magnesium: 2

## 2011-05-24 LAB — COMPREHENSIVE METABOLIC PANEL
ALT: 44
Alkaline Phosphatase: 86
CO2: 23
Calcium: 9.3
GFR calc non Af Amer: >60
Glucose, Bld: 114 — ABNORMAL HIGH
Potassium: 3.9
Sodium: 140

## 2011-05-24 LAB — CBC
Hemoglobin: 12.7 — ABNORMAL LOW
MCHC: 33.7
RBC: 4.58

## 2011-05-24 LAB — DIFFERENTIAL
Basophils Relative: 0
Eosinophils Absolute: 0.1
Neutrophils Relative %: 57

## 2011-06-08 LAB — FUNGUS CULTURE W SMEAR: Fungal Smear: NONE SEEN

## 2011-06-08 LAB — AFB CULTURE WITH SMEAR (NOT AT ARMC): Acid Fast Smear: NONE SEEN

## 2011-06-08 LAB — CULTURE, RESPIRATORY W GRAM STAIN

## 2011-06-08 LAB — CULTURE, RESPIRATORY

## 2011-06-26 ENCOUNTER — Emergency Department (HOSPITAL_COMMUNITY): Payer: Self-pay

## 2011-06-26 ENCOUNTER — Encounter: Payer: Self-pay | Admitting: *Deleted

## 2011-06-26 ENCOUNTER — Inpatient Hospital Stay (HOSPITAL_COMMUNITY)
Admission: EM | Admit: 2011-06-26 | Discharge: 2011-06-30 | DRG: 439 | Disposition: A | Discharge status: Home / Self Care | Payer: Self-pay | Attending: Internal Medicine | Admitting: Internal Medicine

## 2011-06-26 ENCOUNTER — Inpatient Hospital Stay (HOSPITAL_COMMUNITY): Payer: Self-pay

## 2011-06-26 DIAGNOSIS — F1010 Alcohol abuse, uncomplicated: Secondary | ICD-10-CM | POA: Diagnosis present

## 2011-06-26 DIAGNOSIS — R7401 Elevation of levels of liver transaminase levels: Secondary | ICD-10-CM | POA: Diagnosis present

## 2011-06-26 DIAGNOSIS — K7689 Other specified diseases of liver: Secondary | ICD-10-CM | POA: Diagnosis present

## 2011-06-26 DIAGNOSIS — E876 Hypokalemia: Secondary | ICD-10-CM | POA: Diagnosis present

## 2011-06-26 DIAGNOSIS — D696 Thrombocytopenia, unspecified: Secondary | ICD-10-CM | POA: Diagnosis present

## 2011-06-26 DIAGNOSIS — K911 Postgastric surgery syndromes: Secondary | ICD-10-CM | POA: Diagnosis present

## 2011-06-26 DIAGNOSIS — K219 Gastro-esophageal reflux disease without esophagitis: Secondary | ICD-10-CM | POA: Diagnosis present

## 2011-06-26 DIAGNOSIS — F10239 Alcohol dependence with withdrawal, unspecified: Secondary | ICD-10-CM | POA: Diagnosis present

## 2011-06-26 DIAGNOSIS — K5090 Crohn's disease, unspecified, without complications: Secondary | ICD-10-CM | POA: Diagnosis present

## 2011-06-26 DIAGNOSIS — F10939 Alcohol use, unspecified with withdrawal, unspecified: Secondary | ICD-10-CM | POA: Diagnosis present

## 2011-06-26 DIAGNOSIS — K859 Acute pancreatitis without necrosis or infection, unspecified: Principal | ICD-10-CM | POA: Diagnosis present

## 2011-06-26 DIAGNOSIS — F1020 Alcohol dependence, uncomplicated: Secondary | ICD-10-CM | POA: Diagnosis present

## 2011-06-26 DIAGNOSIS — Z8719 Personal history of other diseases of the digestive system: Secondary | ICD-10-CM

## 2011-06-26 HISTORY — DX: Crohn's disease, unspecified, without complications: K50.90

## 2011-06-26 LAB — DIFFERENTIAL
Basophils Absolute: 0 10*3/uL (ref 0.0–0.1)
Basophils Relative: 0 % (ref 0–1)
Eosinophils Absolute: 0 10*3/uL (ref 0.0–0.7)
Eosinophils Relative: 0 % (ref 0–5)
Lymphocytes Relative: 8 % — ABNORMAL LOW (ref 12–46)
Lymphs Abs: 0.8 10*3/uL (ref 0.7–4.0)
Monocytes Absolute: 0.8 10*3/uL (ref 0.1–1.0)
Monocytes Relative: 8 % (ref 3–12)
Neutro Abs: 8.6 10*3/uL — ABNORMAL HIGH (ref 1.7–7.7)
Neutrophils Relative %: 84 % — ABNORMAL HIGH (ref 43–77)

## 2011-06-26 LAB — COMPREHENSIVE METABOLIC PANEL
ALT: 104 U/L — ABNORMAL HIGH (ref 0–53)
AST: 162 U/L — ABNORMAL HIGH (ref 0–37)
Albumin: 3.7 g/dL (ref 3.5–5.2)
Alkaline Phosphatase: 164 U/L — ABNORMAL HIGH (ref 39–117)
BUN: 13 mg/dL (ref 6–23)
CO2: 31 mEq/L (ref 19–32)
Calcium: 9.6 mg/dL (ref 8.4–10.5)
Chloride: 85 mEq/L — ABNORMAL LOW (ref 96–112)
Creatinine, Ser: 0.73 mg/dL (ref 0.50–1.35)
GFR calc Af Amer: >90 mL/min (ref >90)
GFR calc non Af Amer: >90 mL/min (ref >90)
Glucose, Bld: 132 mg/dL — ABNORMAL HIGH (ref 70–99)
Potassium: 3.1 mEq/L — ABNORMAL LOW (ref 3.5–5.1)
Sodium: 130 mEq/L — ABNORMAL LOW (ref 135–145)
Total Bilirubin: 4.1 mg/dL — ABNORMAL HIGH (ref 0.3–1.2)
Total Protein: 7.9 g/dL (ref 6.0–8.3)

## 2011-06-26 LAB — CBC
HCT: 44 % (ref 39.0–52.0)
Hemoglobin: 15.2 g/dL (ref 13.0–17.0)
MCH: 34.8 pg — ABNORMAL HIGH (ref 26.0–34.0)
MCHC: 34.5 g/dL (ref 30.0–36.0)
MCV: 100.7 fL — ABNORMAL HIGH (ref 78.0–100.0)
Platelets: 144 10*3/uL — ABNORMAL LOW (ref 150–400)
RBC: 4.37 MIL/uL (ref 4.22–5.81)
RDW: 14.5 % (ref 11.5–15.5)
WBC: 10.2 10*3/uL (ref 4.0–10.5)

## 2011-06-26 LAB — LIPASE, BLOOD: Lipase: 1423 U/L — ABNORMAL HIGH (ref 11–59)

## 2011-06-26 MED ORDER — SODIUM CHLORIDE 0.9 % IV SOLN
Freq: Once | INTRAVENOUS | Status: AC
  Administered 2011-06-26: 17:00:00 via INTRAVENOUS

## 2011-06-26 MED ORDER — HYDROMORPHONE HCL 1 MG/ML IJ SOLN
1.0000 mg | Freq: Once | INTRAMUSCULAR | Status: AC
  Administered 2011-06-26: 1 mg via INTRAVENOUS
  Filled 2011-06-26: qty 1

## 2011-06-26 MED ORDER — POTASSIUM CHLORIDE IN NACL 20-0.9 MEQ/L-% IV SOLN
Freq: Once | INTRAVENOUS | Status: AC
  Administered 2011-06-26: 23:00:00 via INTRAVENOUS
  Filled 2011-06-26: qty 1000

## 2011-06-26 MED ORDER — ONDANSETRON HCL 4 MG/2ML IJ SOLN
8.0000 mg | Freq: Once | INTRAMUSCULAR | Status: AC
  Administered 2011-06-26: 8 mg via INTRAVENOUS
  Filled 2011-06-26: qty 4

## 2011-06-26 MED ORDER — ONDANSETRON HCL 4 MG/2ML IJ SOLN
4.0000 mg | Freq: Once | INTRAMUSCULAR | Status: AC
  Administered 2011-06-26: 4 mg via INTRAVENOUS
  Filled 2011-06-26: qty 2

## 2011-06-26 MED ORDER — IOHEXOL 300 MG/ML  SOLN
100.0000 mL | Freq: Once | INTRAMUSCULAR | Status: AC | PRN
  Administered 2011-06-26: 100 mL via INTRAVENOUS

## 2011-06-26 NOTE — ED Notes (Signed)
Resting with eyes closed on carrier---awakens with verbal and reports "I can finally rest now that my pain is gone."  Awaiting admission orders for transport to floor.

## 2011-06-26 NOTE — ED Notes (Signed)
Reports nausea with vomiting, abd. Pain in the epigastric region and weight loss over the past month.  Has been told in the past that he has gallstones

## 2011-06-26 NOTE — ED Notes (Signed)
Zofran 8 mg in 50 ml D5W infusing IVPB on pump--tolerating well

## 2011-06-26 NOTE — ED Provider Notes (Signed)
Scribed for Virgel Manifold, MD, the patient was seen in room [facility]08/[facility] . This chart was scribed by Glory Buff.   CSN: [id number] Arrival date & time: 06/26/2011  4:06 PM   First MD Initiated Contact with Patient 06/26/11 1549      Chief Complaint  Patient presents with  . Nausea    (Consider location/radiation/quality/duration/timing/severity/associated sxs/prior treatment) HPI Gary Vasquez is a 60 y.o. male who presents to the Emergency Department complaining of abdominal pain. Pt reports abdominal pain for the past month becoming increasingly worse in the past 4 days. Pt says he has associated nausea and lower back pain. Pain is constant. Pain is rated 7/10 in severity. Pt says he has lost ~30lbs because of the pain/nausea.  Pt denies urinary issues, diarrhea. Reports occasional night sweats, subjective fever, and chills.  Pt drinks daily. History of multiple abd surgeries, crohns with partial bowel resection. Told he has gallstones in past.  Pt seen by PCP Dr. Cleta Alberts last week on Tuesday and put on prednisone. Previous abdominal surgeries done by Neysa Bonito.   Past Medical History  Diagnosis Date  . Crohn disease 2002  . Acid reflux   . Hypertension   Dumping syndrome  Past Surgical History  Procedure Date  . Nissen fundoplication   . Abdominal surgery     History reviewed. No pertinent family history.  History  Substance Use Topics  . Smoking status: Current Some Day Smoker  . Smokeless tobacco: Not on file  . Alcohol Use: Yes     daily liquor    Review of Systems  Constitutional: Positive for appetite change and unexpected weight change (~30lbs weight loss).  Gastrointestinal: Positive for nausea and anal bleeding.  Musculoskeletal: Positive for back pain.  All other systems reviewed and are negative.   Allergies  Review of patient's allergies indicates no known allergies.  Home Medications   Current Outpatient Rx  Name Route Sig Dispense Refill  .  ALPRAZOLAM 2 MG PO TABS Oral Take 0.5-1 mg by mouth 2 (two) times daily as needed. For anxiety     . CETIRIZINE HCL 10 MG PO TABS Oral Take 10 mg by mouth daily.      . IBUPROFEN 200 MG PO TABS Oral Take 200 mg by mouth every 6 (six) hours as needed. inflammation/pain     . PAROXETINE HCL 20 MG PO TABS Oral Take 20 mg by mouth at bedtime.      Marland Kitchen PREDNISONE 20 MG PO TABS Oral Take 20 mg by mouth 3 (three) times daily.        BP 111/86  Pulse 109  Temp(Src) 98.2 F (36.8 C) (Oral)  Resp 20  Ht 6' (1.829 m)  Wt 170 lb (77.111 kg)  BMI 23.06 kg/m2  SpO2 98%  Physical Exam  Nursing note and vitals reviewed. Constitutional: He is oriented to person, place, and time. He appears well-developed and well-nourished.  HENT:  Head: Normocephalic and atraumatic.  Eyes: Conjunctivae are normal. No scleral icterus.  Neck: Neck supple.  Cardiovascular: Regular rhythm and normal heart sounds.   Pulmonary/Chest: Effort normal and breath sounds normal. No respiratory distress.  Abdominal: Soft. There is tenderness.       Diffuse tender with voluntary guarding. No rebound tenderness. Slight distension. Possible hiatal hernia with no evidence of iincarceration  Well healed right abd scar and small lapro scars  Musculoskeletal: Normal range of motion. He exhibits no edema and no tenderness.  Neurological: He is alert and oriented to  person, place, and time.  Skin: Skin is warm and dry.  Psychiatric: He has a normal mood and affect.    ED Course  Procedures (including critical care time)  OTHER DATA REVIEWED: Nursing notes, vital signs, and past medical records reviewed.   DIAGNOSTIC STUDIES: Oxygen Saturation is 98% on room air, normal by my interpretation.     Results for orders placed during the hospital encounter of 06/26/11  CBC      Component Value Range   WBC 10.2  4.0 - 10.5 (K/uL)   RBC 4.37  4.22 - 5.81 (MIL/uL)   Hemoglobin 15.2  13.0 - 17.0 (g/dL)   HCT 44.0  39.0 - 52.0 (%)     MCV 100.7 (*) 78.0 - 100.0 (fL)   MCH 34.8 (*) 26.0 - 34.0 (pg)   MCHC 34.5  30.0 - 36.0 (g/dL)   RDW 14.5  11.5 - 15.5 (%)   Platelets 144 (*) 150 - 400 (K/uL)  DIFFERENTIAL      Component Value Range   Neutrophils Relative 84 (*) 43 - 77 (%)   Neutro Abs 8.6 (*) 1.7 - 7.7 (K/uL)   Lymphocytes Relative 8 (*) 12 - 46 (%)   Lymphs Abs 0.8  0.7 - 4.0 (K/uL)   Monocytes Relative 8  3 - 12 (%)   Monocytes Absolute 0.8  0.1 - 1.0 (K/uL)   Eosinophils Relative 0  0 - 5 (%)   Eosinophils Absolute 0.0  0.0 - 0.7 (K/uL)   Basophils Relative 0  0 - 1 (%)   Basophils Absolute 0.0  0.0 - 0.1 (K/uL)  COMPREHENSIVE METABOLIC PANEL      Component Value Range   Sodium 130 (*) 135 - 145 (mEq/L)   Potassium 3.1 (*) 3.5 - 5.1 (mEq/L)   Chloride 85 (*) 96 - 112 (mEq/L)   CO2 31  19 - 32 (mEq/L)   Glucose, Bld 132 (*) 70 - 99 (mg/dL)   BUN 13  6 - 23 (mg/dL)   Creatinine, Ser 0.73  0.50 - 1.35 (mg/dL)   Calcium 9.6  8.4 - 10.5 (mg/dL)   Total Protein 7.9  6.0 - 8.3 (g/dL)   Albumin 3.7  3.5 - 5.2 (g/dL)   AST 162 (*) 0 - 37 (U/L)   ALT 104 (*) 0 - 53 (U/L)   Alkaline Phosphatase 164 (*) 39 - 117 (U/L)   Total Bilirubin 4.1 (*) 0.3 - 1.2 (mg/dL)   GFR calc non Af Amer >90  >90 (mL/min)   GFR calc Af Amer >90  >90 (mL/min)  LIPASE, BLOOD      Component Value Range   Lipase 1423 (*) 11 - 59 (U/L)   Ct Abdomen Pelvis W Contrast  06/26/2011  *RADIOLOGY REPORT*  Clinical Data: Abdominal pain, nausea and vomiting.  Elevated liver function tests.  History of Crohn disease with bowel resections.  CT ABDOMEN AND PELVIS WITH CONTRAST  Technique:  Multidetector CT imaging of the abdomen and pelvis was performed following the standard protocol during bolus administration of intravenous contrast.  Contrast: 11m OMNIPAQUE IOHEXOL 300 MG/ML IV SOLN  Comparison: 11/27/2007  Findings: The lung bases are clear.  There is diffuse low attenuation change throughout the liver consistent with fatty infiltration.   The gallbladder and spleen are unremarkable.  The stomach is decompressed.  There is infiltration in the abdominal fat around the celiac axis, gastrohepatic and gastrosplenic ligaments, and around the pancreas and duodenum.  Changes suggest inflammatory process.  The duodenum is  decompressed and cannot be assessed for wall thickening.  Changes might reflect early acute pancreatitis or gastric/duodenal inflammation.  Prominent lymph nodes are present in the celiac axis and epigastric region with calcifications consistent with chronic granulomatous change.  These nodes are stable.  No free air in the abdomen.  Calcification of the abdominal aorta without aneurysm.  Kidneys are unremarkable. No adrenal gland nodules.  The colon is mostly decompressed with contrast material extending to the rectum.  There appears to be resection of the right colon with ileal colonic anastomoses.  Mild thickening the wall of the terminal ileum suggesting possible residual stricture.  Pelvis:  Calcification of normal size prostate gland.  The bladder wall is not thickened.  No free or loculated pelvic fluid collections.  Scattered pelvic lymph nodes are not pathologically enlarged.  Fat in the inguinal canals.  Mild degenerative changes in the lumbar spine with normal alignment.  Postoperative changes along the anterior abdominal wall.  IMPRESSION: Diffuse fatty infiltration of the liver.  Inflammatory stranding throughout the upper abdominal fat consistent with inflammatory process such as pancreatitis or gastritis/duodenitis. Postoperative changes of right colon resection and ileocolonic anastomoses.  Thickening of the wall of the terminal ileum which could represent residual Crohn's stricture.  Active inflammation is not excluded.  Original Report Authenticated By: Neale Burly, M.D.    ED MEDICATIONS  Medications  0.9 %  sodium chloride infusion   HYDROmorphone (DILAUDID) injection 1 mg   ondansetron (ZOFRAN) injection 8  mg     3:53 PM EDP with PT. Discussed with PT that his labs indicate pancreatitis.  5:14 PM EDP at Pt bedside. Collected additional history, performed PE. Discussed plans to get [place], possible CT scan and plan to admit.  10:34 PM Pt recheck. Discussed CT findings showing a lot of inflammation around pancreas. Renal function okay. Liver function is not normal may be due to alcohol use. No evidence of infection of gallstones. Potassium also low. Plan to treat with fluids and to admit. [place] planned for tomorrow to rule out gallstones.   1. Abdominal pain   2. Pancreatitis   3. Alcohol abuse   4. Hypokalemia   5. Nausea and vomiting     MDM  59yM with abdominal pain. Pancreatitis. Likely 2/2 etoh abuse. Also consider gallstone. Do not feel needs emergent [place] at this time and will defer to admitting team. IVF, pain meds and antiemetics. Admission for further tx and eval.  I personally preformed the services scribed in my presence. The recorded information has been reviewed and considered. Virgel Manifold, MD.        Virgel Manifold, MD 07/03/11 [phone]

## 2011-06-26 NOTE — ED Notes (Signed)
Pt c/o nausea, being unable to eat, mid abdominal and back pain x 4 weeks. Pt states that he can't even thinking about eating.

## 2011-06-26 NOTE — ED Notes (Signed)
Pt states that he has lost 30 pounds in the last month.

## 2011-06-26 NOTE — ED Notes (Signed)
Family at bedside. Patient and family does not need anything at this time. 

## 2011-06-26 NOTE — ED Notes (Signed)
Pt to CT

## 2011-06-26 NOTE — ED Notes (Signed)
Patient is comfortable does not need anything at this time. O2 stat is reading at 90%

## 2011-06-26 NOTE — ED Notes (Signed)
Called report to PJ RN on 200

## 2011-06-26 NOTE — ED Notes (Signed)
B/P 151/95  HR  80  R 20  P.O. 99%--Alert and oriented--c/o headache which he states he usually has after a seizure.  Mom at bedside.

## 2011-06-27 ENCOUNTER — Encounter (HOSPITAL_COMMUNITY): Payer: Self-pay | Admitting: General Practice

## 2011-06-27 DIAGNOSIS — K859 Acute pancreatitis without necrosis or infection, unspecified: Secondary | ICD-10-CM | POA: Diagnosis present

## 2011-06-27 DIAGNOSIS — R7401 Elevation of levels of liver transaminase levels: Secondary | ICD-10-CM | POA: Diagnosis present

## 2011-06-27 DIAGNOSIS — F1010 Alcohol abuse, uncomplicated: Secondary | ICD-10-CM | POA: Diagnosis present

## 2011-06-27 LAB — LIPASE, BLOOD: Lipase: 605 U/L — ABNORMAL HIGH (ref 11–59)

## 2011-06-27 LAB — COMPREHENSIVE METABOLIC PANEL
Albumin: 2.6 g/dL — ABNORMAL LOW (ref 3.5–5.2)
BUN: 11 mg/dL (ref 6–23)
Calcium: 8 mg/dL — ABNORMAL LOW (ref 8.4–10.5)
Creatinine, Ser: 0.62 mg/dL (ref 0.50–1.35)
Total Bilirubin: 4 mg/dL — ABNORMAL HIGH (ref 0.3–1.2)
Total Protein: 5.8 g/dL — ABNORMAL LOW (ref 6.0–8.3)

## 2011-06-27 MED ORDER — ALPRAZOLAM 0.5 MG PO TABS: 0.5000 mg | ORAL_TABLET | Freq: Two times a day (BID) | ORAL | Status: DC | PRN

## 2011-06-27 MED ORDER — ONDANSETRON HCL 4 MG/2ML IJ SOLN: 4.0000 mg | Freq: Four times a day (QID) | INTRAMUSCULAR | Status: DC | PRN

## 2011-06-27 MED ORDER — HYDRALAZINE HCL 20 MG/ML IJ SOLN
10.0000 mg | Freq: Once | INTRAMUSCULAR | Status: AC
  Administered 2011-06-27: 10 mg via INTRAVENOUS

## 2011-06-27 MED ORDER — SODIUM CHLORIDE 0.9 % IV SOLN
INTRAVENOUS | Status: DC
  Administered 2011-06-27 – 2011-06-29 (×5): via INTRAVENOUS

## 2011-06-27 MED ORDER — HYDRALAZINE HCL 20 MG/ML IJ SOLN
INTRAMUSCULAR | Status: AC
  Filled 2011-06-27: qty 1

## 2011-06-27 MED ORDER — LORAZEPAM 2 MG/ML IJ SOLN
1.0000 mg | Freq: Four times a day (QID) | INTRAMUSCULAR | Status: AC | PRN
  Filled 2011-06-27: qty 1

## 2011-06-27 MED ORDER — OXYCODONE HCL 5 MG PO TABS
5.0000 mg | ORAL_TABLET | ORAL | Status: DC | PRN
  Administered 2011-06-27 – 2011-06-29 (×4): 5 mg via ORAL
  Filled 2011-06-27 (×4): qty 1

## 2011-06-27 MED ORDER — PAROXETINE HCL 20 MG PO TABS
20.0000 mg | ORAL_TABLET | Freq: Every day | ORAL | Status: DC
  Administered 2011-06-27 – 2011-06-29 (×3): 20 mg via ORAL
  Filled 2011-06-27 (×3): qty 1

## 2011-06-27 MED ORDER — THIAMINE HCL 100 MG/ML IJ SOLN
100.0000 mg | Freq: Every day | INTRAMUSCULAR | Status: DC
  Administered 2011-06-27: 100 mg via INTRAVENOUS
  Filled 2011-06-27 (×2): qty 2

## 2011-06-27 MED ORDER — DOCUSATE SODIUM 100 MG PO CAPS
100.0000 mg | ORAL_CAPSULE | Freq: Two times a day (BID) | ORAL | Status: DC
  Administered 2011-06-27 – 2011-06-30 (×6): 100 mg via ORAL
  Filled 2011-06-27 (×7): qty 1

## 2011-06-27 MED ORDER — THIAMINE HCL 100 MG/ML IJ SOLN
Freq: Once | INTRAVENOUS | Status: AC
  Administered 2011-06-27: 02:00:00 via INTRAVENOUS
  Filled 2011-06-27: qty 1000

## 2011-06-27 MED ORDER — ACETAMINOPHEN 650 MG RE SUPP: 650.0000 mg | Freq: Four times a day (QID) | RECTAL | Status: DC | PRN

## 2011-06-27 MED ORDER — THIAMINE HCL 100 MG/ML IJ SOLN
INTRAMUSCULAR | Status: AC
  Filled 2011-06-27: qty 2

## 2011-06-27 MED ORDER — PANTOPRAZOLE SODIUM 40 MG PO TBEC: 40.0000 mg | DELAYED_RELEASE_TABLET | Freq: Every day | ORAL | Status: DC

## 2011-06-27 MED ORDER — FOLIC ACID 5 MG/ML IJ SOLN
INTRAMUSCULAR | Status: AC
  Filled 2011-06-27: qty 0.2

## 2011-06-27 MED ORDER — LORAZEPAM 1 MG PO TABS
0.0000 mg | ORAL_TABLET | Freq: Four times a day (QID) | ORAL | Status: DC
  Administered 2011-06-27 (×2): 1 mg via ORAL
  Administered 2011-06-28: 2 mg via ORAL
  Filled 2011-06-27 (×2): qty 1
  Filled 2011-06-27: qty 2

## 2011-06-27 MED ORDER — THERA M PLUS PO TABS
1.0000 | ORAL_TABLET | Freq: Every day | ORAL | Status: DC
  Administered 2011-06-27 – 2011-06-30 (×3): 1 via ORAL
  Filled 2011-06-27 (×3): qty 1

## 2011-06-27 MED ORDER — M.V.I. ADULT IV INJ
INJECTION | INTRAVENOUS | Status: AC
  Filled 2011-06-27: qty 10

## 2011-06-27 MED ORDER — LORAZEPAM 1 MG PO TABS
1.0000 mg | ORAL_TABLET | Freq: Four times a day (QID) | ORAL | Status: AC | PRN
  Filled 2011-06-27 (×2): qty 1

## 2011-06-27 MED ORDER — VITAMIN B-1 100 MG PO TABS
100.0000 mg | ORAL_TABLET | Freq: Every day | ORAL | Status: DC
  Administered 2011-06-28 – 2011-06-30 (×3): 100 mg via ORAL
  Filled 2011-06-27 (×3): qty 1

## 2011-06-27 MED ORDER — ENOXAPARIN SODIUM 40 MG/0.4ML ~~LOC~~ SOLN
40.0000 mg | SUBCUTANEOUS | Status: DC
  Administered 2011-06-27 – 2011-06-30 (×4): 40 mg via SUBCUTANEOUS
  Filled 2011-06-27 (×4): qty 0.4

## 2011-06-27 MED ORDER — LORAZEPAM 1 MG PO TABS: 0.0000 mg | ORAL_TABLET | Freq: Two times a day (BID) | ORAL | Status: DC

## 2011-06-27 MED ORDER — INFLUENZA VIRUS VACC SPLIT PF IM SUSP
0.5000 mL | Freq: Once | INTRAMUSCULAR | Status: AC
  Administered 2011-06-27: 0.5 mL via INTRAMUSCULAR
  Filled 2011-06-27: qty 0.5

## 2011-06-27 MED ORDER — HYDROMORPHONE HCL 2 MG/ML IJ SOLN: 2.0000 mg | INTRAMUSCULAR | Status: DC | PRN

## 2011-06-27 MED ORDER — HYDROMORPHONE HCL 1 MG/ML IJ SOLN
INTRAMUSCULAR | Status: AC
  Administered 2011-06-27: 2 mg
  Filled 2011-06-27: qty 2

## 2011-06-27 MED ORDER — PREDNISONE 20 MG PO TABS
20.0000 mg | ORAL_TABLET | Freq: Three times a day (TID) | ORAL | Status: DC
  Administered 2011-06-27 – 2011-06-29 (×7): 20 mg via ORAL
  Filled 2011-06-27 (×7): qty 1

## 2011-06-27 MED ORDER — ONDANSETRON HCL 4 MG PO TABS: 4.0000 mg | ORAL_TABLET | Freq: Four times a day (QID) | ORAL | Status: DC | PRN

## 2011-06-27 MED ORDER — ACETAMINOPHEN 325 MG PO TABS: 650.0000 mg | ORAL_TABLET | Freq: Four times a day (QID) | ORAL | Status: DC | PRN

## 2011-06-27 MED ORDER — MORPHINE SULFATE 2 MG/ML IJ SOLN
2.0000 mg | INTRAMUSCULAR | Status: DC | PRN
  Administered 2011-06-27 (×2): 2 mg via INTRAVENOUS
  Filled 2011-06-27 (×2): qty 1

## 2011-06-27 MED ORDER — ZOLPIDEM TARTRATE 5 MG PO TABS: 5.0000 mg | ORAL_TABLET | Freq: Every evening | ORAL | Status: DC | PRN

## 2011-06-27 MED ORDER — BIOTENE DRY MOUTH MT LIQD: Freq: Two times a day (BID) | OROMUCOSAL | Status: DC

## 2011-06-27 MED ORDER — HEPARIN SODIUM (PORCINE) 5000 UNIT/ML IJ SOLN
5000.0000 [IU] | Freq: Three times a day (TID) | INTRAMUSCULAR | Status: DC
  Administered 2011-06-27 (×2): 5000 [IU] via SUBCUTANEOUS
  Filled 2011-06-27 (×2): qty 1

## 2011-06-27 MED ORDER — FOLIC ACID 1 MG PO TABS
1.0000 mg | ORAL_TABLET | Freq: Every day | ORAL | Status: DC
  Administered 2011-06-27 – 2011-06-30 (×4): 1 mg via ORAL
  Filled 2011-06-27 (×4): qty 1

## 2011-06-27 MED ORDER — PANTOPRAZOLE SODIUM 40 MG IV SOLR
40.0000 mg | INTRAVENOUS | Status: DC
  Administered 2011-06-27: 40 mg via INTRAVENOUS
  Filled 2011-06-27: qty 40

## 2011-06-27 MED ORDER — SENNA 8.6 MG PO TABS: 2.0000 | ORAL_TABLET | Freq: Every day | ORAL | Status: DC | PRN

## 2011-06-27 MED ORDER — HYDROMORPHONE HCL 2 MG/ML IJ SOLN
2.0000 mg | INTRAMUSCULAR | Status: DC | PRN
  Administered 2011-06-27 – 2011-06-28 (×6): 2 mg via INTRAVENOUS
  Filled 2011-06-27 (×7): qty 2
  Filled 2011-06-27: qty 1
  Filled 2011-06-27: qty 2

## 2011-06-27 MED ORDER — CHLORHEXIDINE GLUCONATE 0.12 % MT SOLN: 15.0000 mL | Freq: Two times a day (BID) | OROMUCOSAL | Status: DC

## 2011-06-27 NOTE — H&P (Signed)
PCP:   Ara Kussmaul, MD   Chief Complaint:  Abdominal pain   HPI: Gary Vasquez is an 60 y.o. male. With h/o Crohn's disease, GERD s/p Nissen fundoplication and resultant dumping syndrome who presents with 1 mo h/o nausea, vomiting, and abd pain, worsening in the past 4-5 d for which he came to the ED and was found to have lipase of 1400 and elevated LFT's.   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 He has had decreased PO intake for a month, progressive weakness, and 30 lb wt loss. Abd pain worsening so came to ED, found to have hepatitis and pancreatitis, and CT abdomen shows diffuse inflammation but no gallstones, no pseudocysts, etc. Admission for pancreatitis.     Past Medical History  Diagnosis Date  . Crohn disease   . Acid reflux   . Hypertension     Past Surgical History  Procedure Date  . Nissen fundoplication   . Abdominal surgery     Medications:  HOME MEDS: Prior to Admission medications   Medication Sig Start Date End Date Taking? Authorizing Provider  alprazolam Prudy Feeler) 2 MG tablet Take 0.5-1 mg by mouth 2 (two) times daily as needed. For anxiety    Yes Historical Provider, MD  cetirizine (ZYRTEC) 10 MG tablet Take 10 mg by mouth daily.     Yes Historical Provider, MD  ibuprofen (ADVIL,MOTRIN) 200 MG tablet Take 200 mg by mouth every 6 (six) hours as needed. inflammation/pain    Yes Historical Provider, MD  PARoxetine (PAXIL) 20 MG tablet Take 20 mg by mouth at bedtime.     Yes Historical Provider, MD  predniSONE (DELTASONE) 20 MG tablet Take 20 mg by mouth 3 (three) times daily.     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 Endorses shakes in teh morning and eye openers. Previously attended AA, and was sober for 2 years. Denies DT's, seizures, hallucinations. Cares for his 58 yo daughter alone.   reports that he has been smoking.  He does not have any smokeless tobacco history on file. He reports that he drinks alcohol. He reports that he does not use illicit drugs.  Family History: History reviewed. No pertinent family history.  Rewiew of Systems:  Per HPI, also with night sweats but no objective fevers. Denies bloody diarrhea, cardiopulmonary symptoms. Otherwise unremarkable.   Physical Exam: Filed Vitals:   06/26/11 1739 06/26/11 2231 06/26/11 2352 06/27/11 0017  BP: 145/88 174/107 152/86 176/101  Pulse: 101 91 88 89  Temp: 98.6 F (37 C)   98.1 F (36.7 C)  TempSrc: Oral   Oral  Resp: 20 20  20   Height:    6' (1.829 m)  Weight:    72.8 kg (160 lb 7.9 oz)  SpO2: 90% 94% 95% 94%   Blood pressure 176/101, pulse 89, temperature 98.1 F (36.7 C), temperature source Oral, resp. rate 20, height 6' (1.829 m), weight 72.8 kg (160 lb 7.9 oz), SpO2 94.00%.  Gen: Pleasant, well appearing M in no distress, able to relate history well. Comfortable after pain control in the ED HEENT: PERRLA, EOMI, sclera with very minimal amount of icterus at the edges. Mouth moist, normal appearing Lungs CTAB no w/c/r/r Heart RRR no m/g  Abd a bit distended but non-peritoneal, with minimal facial grimacing  to palpation but not overt Extremities warm, well perfused, no c/c/e Neuro non focal, alert, pleasant, moves extremities spontaneously   Labs & Imaging Results for orders placed during the hospital encounter of 06/26/11 (from the past 48 hour(s))  CBC     Status: Abnormal   Collection Time   06/26/11 12:40 PM      Component Value Range Comment   WBC 10.2  4.0 - 10.5 (K/uL)    RBC 4.37  4.22 - 5.81 (MIL/uL)    Hemoglobin 15.2  13.0 - 17.0 (g/dL)    HCT 45.4  09.8 - 11.9 (%)    MCV 100.7 (*) 78.0 - 100.0 (fL)    MCH 34.8 (*) 26.0 - 34.0 (pg)    MCHC 34.5  30.0 - 36.0 (g/dL)    RDW 14.7  82.9 - 56.2 (%)    Platelets 144 (*) 150 - 400 (K/uL)   DIFFERENTIAL     Status: Abnormal    Collection Time   06/26/11 12:40 PM      Component Value Range Comment   Neutrophils Relative 84 (*) 43 - 77 (%)    Neutro Abs 8.6 (*) 1.7 - 7.7 (K/uL)    Lymphocytes Relative 8 (*) 12 - 46 (%)    Lymphs Abs 0.8  0.7 - 4.0 (K/uL)    Monocytes Relative 8  3 - 12 (%)    Monocytes Absolute 0.8  0.1 - 1.0 (K/uL)    Eosinophils Relative 0  0 - 5 (%)    Eosinophils Absolute 0.0  0.0 - 0.7 (K/uL)    Basophils Relative 0  0 - 1 (%)    Basophils Absolute 0.0  0.0 - 0.1 (K/uL)   COMPREHENSIVE METABOLIC PANEL     Status: Abnormal   Collection Time   06/26/11 12:40 PM      Component Value Range Comment   Sodium 130 (*) 135 - 145 (mEq/L)    Potassium 3.1 (*) 3.5 - 5.1 (mEq/L)    Chloride 85 (*) 96 - 112 (mEq/L)    CO2 31  19 - 32 (mEq/L)    Glucose, Bld 132 (*) 70 - 99 (mg/dL)    BUN 13  6 - 23 (mg/dL)    Creatinine, Ser 1.30  0.50 - 1.35 (mg/dL)    Calcium 9.6  8.4 - 10.5 (mg/dL)    Total Protein 7.9  6.0 - 8.3 (g/dL)    Albumin 3.7  3.5 - 5.2 (g/dL)    AST 865 (*) 0 - 37 (U/L)    ALT 104 (*) 0 - 53 (U/L)    Alkaline Phosphatase 164 (*) 39 - 117 (U/L)    Total Bilirubin 4.1 (*) 0.3 - 1.2 (mg/dL)    GFR calc non Af Amer >90  >90 (mL/min)    GFR calc Af Amer >90  >90 (mL/min)   LIPASE, BLOOD     Status: Abnormal   Collection Time   06/26/11 12:41 PM      Component Value Range Comment   Lipase 1423 (*) 11 - 59 (U/L) CORRECTED ON 10/29 AT 1339: PREVIOUSLY REPORTED AS 887   Ct Abdomen Pelvis W Contrast  06/26/2011  *RADIOLOGY REPORT*  Clinical Data: Abdominal pain, nausea and vomiting.  Elevated liver function tests.  History of Crohn disease with bowel resections.  CT ABDOMEN AND PELVIS WITH CONTRAST  Technique:  Multidetector CT imaging of the abdomen and pelvis was performed following the standard protocol during bolus administration of intravenous contrast.  Contrast: OMNIPAQUE  IOHEXOL 300 MG/ML IV SOLN  Comparison: 11/27/2007  Findings: The lung bases are clear.  There is diffuse  low attenuation change throughout the liver consistent with fatty infiltration.  The gallbladder and spleen are unremarkable.  The stomach is decompressed.  There is infiltration in the abdominal fat around the celiac axis, gastrohepatic and gastrosplenic ligaments, and around the pancreas and duodenum.  Changes suggest inflammatory process.  The duodenum is decompressed and cannot be assessed for wall thickening.  Changes might reflect early acute pancreatitis or gastric/duodenal inflammation.  Prominent lymph nodes are present in the celiac axis and epigastric region with calcifications consistent with chronic granulomatous change.  These nodes are stable.  No free air in the abdomen.  Calcification of the abdominal aorta without aneurysm.  Kidneys are unremarkable. No adrenal gland nodules.  The colon is mostly decompressed with contrast material extending to the rectum.  There appears to be resection of the right colon with ileal colonic anastomoses.  Mild thickening the wall of the terminal ileum suggesting possible residual stricture.  Pelvis:  Calcification of normal size prostate gland.  The bladder wall is not thickened.  No free or loculated pelvic fluid collections.  Scattered pelvic lymph nodes are not pathologically enlarged.  Fat in the inguinal canals.  Mild degenerative changes in the lumbar spine with normal alignment.  Postoperative changes along the anterior abdominal wall.  IMPRESSION: Diffuse fatty infiltration of the liver.  Inflammatory stranding throughout the upper abdominal fat consistent with inflammatory process such as pancreatitis or gastritis/duodenitis. Postoperative changes of right colon resection and ileocolonic anastomoses.  Thickening of the wall of the terminal ileum which could represent residual Crohn's stricture.  Active inflammation is not excluded.  Original Report Authenticated By: Marlon Pel, M.D.    Assessment Present on Admission:  .Pancreatitis .Alcohol  abuse .Transaminitis   PLAN: Gary Vasquez is an 60 y.o. male. With h/o Crohn's disease, GERD s/p Nissen fundoplication and resultant dumping syndrome who presents with 1 mo h/o nausea, vomiting, and abd pain, worsening in the past 4-5 d for which he came to the ED and was found to have lipase of 1400 and elevated LFT's.   Suspect this is mostly due to alcohol abuse, as no gallstones were visualized on CT scanning. RUQ u/s was ordered in the ED, but I think this can be cancelled as a test that would be less sensitive to gallstones than CT scanning, and he has a good story for EtOH pancreatitis. The hepatitis I think may be due to pure pancreatitis as CT appaers to have diffuse inflammatory changes -- and pt may have some element of EtOH hepatitis as well.   Regardless, we'll treat pancreatitis with NPO, IVF's, pain control. Keep him on CIWA scale given reports of EtOH withdrawal symptoms. Will give some K in IVF's.   Counseled him about alcohol abuse and would encourage further counseling while admitted.     Other plans as per orders.  Critical care time: 60 minutes.   Yanna Leaks 06/27/2011, 1:57 AM

## 2011-06-27 NOTE — Progress Notes (Signed)
Subjective: Patient seen and examine this am. C/o epigastric pain which improved after switching to IV dilaudid. informs having symptoms of epigastric pain radiating to back for almost a month. Actively drinks 4-5 drinks on a daily basis , no hx of withdrawal symptoms. Denies nausea at this time.  Objective:  Vital signs in last 24 hours:  Filed Vitals:   06/27/11 0017 06/27/11 0247 06/27/11 0545 06/27/11 0800  BP: 176/101 152/86 158/95 158/95  Pulse: 89 82 88 84  Temp: 98.1 F (36.7 C)  97.9 F (36.6 C)   TempSrc: Oral  Oral   Resp: 20  20   Height: 6' (1.829 m)     Weight: 72.8 kg (160 lb 7.9 oz)     SpO2: 94%  95%     Intake/Output from previous day:   Intake/Output Summary (Last 24 hours) at 06/27/11 0840 Last data filed at 06/27/11 0300  Gross per 24 hour  Intake      0 ml  Output    801 ml  Net   -801 ml    Physical Exam:  General: middle aged male lying in bed in no acute distress. HEENT: no pallor, no icterus, moist oral mucosa, no JVD, no lymphadenopathy Heart: Normal  s1 &s2  Regular rate and rhythm, without murmurs, rubs, gallops. Lungs: Clear to auscultation bilaterally. Abdomen: Soft, epigastric tenderness to palpation, nondistended, positive bowel sounds. Extremities: No clubbing cyanosis or edema with positive pedal pulses. Neuro: Alert, awake, oriented x3,, no tremors   Lab Results:  Basic Metabolic Panel:    Component Value Date/Time   NA 132* 06/27/2011 0412   K 3.2* 06/27/2011 0412   CL 94* 06/27/2011 0412   CO2 30 06/27/2011 0412   BUN 11 06/27/2011 0412   CREATININE 0.62 06/27/2011 0412   GLUCOSE 78 06/27/2011 0412   CALCIUM 8.0* 06/27/2011 0412   CBC:    Component Value Date/Time   WBC 10.2 06/26/2011 1240   HGB 15.2 06/26/2011 1240   HCT 44.0 06/26/2011 1240   PLT 144* 06/26/2011 1240   MCV 100.7* 06/26/2011 1240   NEUTROABS 8.6* 06/26/2011 1240   LYMPHSABS 0.8 06/26/2011 1240   MONOABS 0.8 06/26/2011 1240   EOSABS 0.0  06/26/2011 1240   BASOSABS 0.0 06/26/2011 1240    No results found for this or any previous visit (from the past 240 hour(s)).  Studies/Results: Ct Abdomen Pelvis W Contrast  06/26/2011  *RADIOLOGY REPORT*  Clinical Data: Abdominal pain, nausea and vomiting.  Elevated liver function tests.  History of Crohn disease with bowel resections.  CT ABDOMEN AND PELVIS WITH CONTRAST  Technique:  Multidetector CT imaging of the abdomen and pelvis was performed following the standard protocol during bolus administration of intravenous contrast.  Contrast: 112m OMNIPAQUE IOHEXOL 300 MG/ML IV SOLN  Comparison: 11/27/2007  Findings: The lung bases are clear.  There is diffuse low attenuation change throughout the liver consistent with fatty infiltration.  The gallbladder and spleen are unremarkable.  The stomach is decompressed.  There is infiltration in the abdominal fat around the celiac axis, gastrohepatic and gastrosplenic ligaments, and around the pancreas and duodenum.  Changes suggest inflammatory process.  The duodenum is decompressed and cannot be assessed for wall thickening.  Changes might reflect early acute pancreatitis or gastric/duodenal inflammation.  Prominent lymph nodes are present in the celiac axis and epigastric region with calcifications consistent with chronic granulomatous change.  These nodes are stable.  No free air in the abdomen.  Calcification of the abdominal  aorta without aneurysm.  Kidneys are unremarkable. No adrenal gland nodules.  The colon is mostly decompressed with contrast material extending to the rectum.  There appears to be resection of the right colon with ileal colonic anastomoses.  Mild thickening the wall of the terminal ileum suggesting possible residual stricture.  Pelvis:  Calcification of normal size prostate gland.  The bladder wall is not thickened.  No free or loculated pelvic fluid collections.  Scattered pelvic lymph nodes are not pathologically enlarged.  Fat in  the inguinal canals.  Mild degenerative changes in the lumbar spine with normal alignment.  Postoperative changes along the anterior abdominal wall.  IMPRESSION: Diffuse fatty infiltration of the liver.  Inflammatory stranding throughout the upper abdominal fat consistent with inflammatory process such as pancreatitis or gastritis/duodenitis. Postoperative changes of right colon resection and ileocolonic anastomoses.  Thickening of the wall of the terminal ileum which could represent residual Crohn's stricture.  Active inflammation is not excluded.  Original Report Authenticated By: Neale Burly, M.D.    Medications: Scheduled Meds:   . sodium chloride   Intravenous Once  . 0.9 % NaCl with KCl 20 mEq / L   Intravenous Once  . antiseptic oral rinse   Mouth Rinse q12n4p  . chlorhexidine  15 mL Mouth/Throat BID  . docusate sodium  100 mg Oral BID  . folic acid  1 mg Oral Daily  . heparin  5,000 Units Subcutaneous Q8H  . HYDROmorphone      .  HYDROmorphone (DILAUDID) injection  1 mg Intravenous Once  .  HYDROmorphone (DILAUDID) injection  1 mg Intravenous Once  . influenza  inactive virus vaccine  0.5 mL Intramuscular Once  . LORazepam  0-4 mg Oral Q6H   Followed by  . LORazepam  0-4 mg Oral Q12H  . multivitamins ther. w/minerals  1 tablet Oral Daily  . ondansetron  4 mg Intravenous Once  . ondansetron  8 mg Intravenous Once  . pantoprazole (PROTONIX) IV  40 mg Intravenous Q24H  . PARoxetine  20 mg Oral QHS  . predniSONE  20 mg Oral TID  . general admission iv infusion   Intravenous Once  . vitamin B-1  100 mg Oral Daily   Or  . thiamine  100 mg Intravenous Daily  . DISCONTD: pantoprazole  40 mg Oral Daily   Continuous Infusions:   . sodium chloride     PRN Meds:.acetaminophen, acetaminophen, alprazolam, HYDROmorphone (DILAUDID) injection, iohexol, LORazepam, LORazepam, ondansetron (ZOFRAN) IV, ondansetron, oxyCODONE, senna, zolpidem, DISCONTD:  HYDROmorphone (DILAUDID)  injection, DISCONTD: morphine  Assessment/Plan:  53 mael with hx of crohns on chr prednoisone , HTN , active etoh use , GERD s/p nissen fundoplication presents with 1 mth hx of epigastric pain with N/V  worsened for past few days with findings fo transaminitis and elevated lipase with CT abd suggestive of  acute pancreatitis along with ?gastritis vs duodenitis and fatty liver.   PLAN:  acute pancreatitis  likely etoh induced . CT abdomen negative for galls tones and mild transaminitis which could represent fatty liver evident on imaging Cont NPO for now, will start on clears once pain improved  cont IVNS  Pain meds switched to IV dilaudid prn  cont zofran  Cont IV protonix Follow lipase and LFTs Serial abdominla exam  etoh abuse Cont IV fluids  thiamine, foalte  elevaed MCV likely in the seting of chr etoh use Monitor for withdrawal syomtosm  cont CIWA and ativan prn Counseled on etoh cessation  Gastritis  Cont IV protonix   transaminitis likely in the setting of fatty liver rather than acute hepatitis although total bili is elevated. No galls tones seen on CT. Early etoh hepatitis cannot be ruled out and will follow closely  crohns disease  on chr prednisone and follows with [facility] in [place] Cont prednisone  DVT prophylaxis: sq lovenox   LOS: 1 day   Nalayah Hitt 06/27/2011, 8:40 AM

## 2011-06-28 LAB — CBC
HCT: 37.9 % — ABNORMAL LOW (ref 39.0–52.0)
Hemoglobin: 13.2 g/dL (ref 13.0–17.0)
MCH: 36.3 pg — ABNORMAL HIGH (ref 26.0–34.0)
MCHC: 34.8 g/dL (ref 30.0–36.0)
MCV: 104.1 fL — ABNORMAL HIGH (ref 78.0–100.0)

## 2011-06-28 LAB — COMPREHENSIVE METABOLIC PANEL
ALT: 60 U/L — ABNORMAL HIGH (ref 0–53)
Albumin: 2.7 g/dL — ABNORMAL LOW (ref 3.5–5.2)
Alkaline Phosphatase: 131 U/L — ABNORMAL HIGH (ref 39–117)
BUN: 18 mg/dL (ref 6–23)
Chloride: 96 mEq/L (ref 96–112)
GFR calc Af Amer: >90 mL/min (ref >90)
Glucose, Bld: 95 mg/dL (ref 70–99)
Potassium: 3.7 mEq/L (ref 3.5–5.1)
Sodium: 134 mEq/L — ABNORMAL LOW (ref 135–145)
Total Bilirubin: 3.2 mg/dL — ABNORMAL HIGH (ref 0.3–1.2)

## 2011-06-28 LAB — LIPASE, BLOOD: Lipase: 114 U/L — ABNORMAL HIGH (ref 11–59)

## 2011-06-28 MED ORDER — HYDRALAZINE HCL 20 MG/ML IJ SOLN
10.0000 mg | INTRAMUSCULAR | Status: DC | PRN
  Administered 2011-06-29: 10 mg via INTRAVENOUS
  Filled 2011-06-28: qty 1

## 2011-06-28 MED ORDER — POTASSIUM CHLORIDE 10 MEQ/100ML IV SOLN
10.0000 meq | INTRAVENOUS | Status: AC
  Administered 2011-06-28 (×4): 10 meq via INTRAVENOUS
  Filled 2011-06-28 (×4): qty 100

## 2011-06-28 MED ORDER — LORAZEPAM 2 MG/ML IJ SOLN
0.0000 mg | Freq: Four times a day (QID) | INTRAMUSCULAR | Status: AC
  Administered 2011-06-28 – 2011-06-29 (×2): 1 mg via INTRAVENOUS
  Filled 2011-06-28 (×2): qty 1

## 2011-06-28 MED ORDER — PANTOPRAZOLE SODIUM 40 MG PO TBEC
40.0000 mg | DELAYED_RELEASE_TABLET | Freq: Every day | ORAL | Status: DC
  Administered 2011-06-28 – 2011-06-30 (×3): 40 mg via ORAL
  Filled 2011-06-28 (×3): qty 1

## 2011-06-28 MED ORDER — LABETALOL HCL 5 MG/ML IV SOLN: 10.0000 mg | INTRAVENOUS | Status: DC | PRN

## 2011-06-28 MED ORDER — LORAZEPAM 2 MG/ML IJ SOLN: 1.0000 mg | Freq: Four times a day (QID) | INTRAMUSCULAR | Status: DC | PRN

## 2011-06-28 MED ORDER — LORAZEPAM 2 MG/ML IJ SOLN: 0.0000 mg | Freq: Two times a day (BID) | INTRAMUSCULAR | Status: DC

## 2011-06-28 MED ORDER — LORAZEPAM 1 MG PO TABS
1.0000 mg | ORAL_TABLET | Freq: Four times a day (QID) | ORAL | Status: DC | PRN
  Administered 2011-06-29 – 2011-06-30 (×5): 1 mg via ORAL
  Filled 2011-06-28 (×3): qty 1

## 2011-06-28 MED ORDER — HYDRALAZINE HCL 20 MG/ML IJ SOLN
INTRAMUSCULAR | Status: AC
  Administered 2011-06-28: 20 mg
  Filled 2011-06-28: qty 1

## 2011-06-28 MED ORDER — POTASSIUM CHLORIDE 20 MEQ/15ML (10%) PO LIQD
40.0000 meq | Freq: Once | ORAL | Status: DC
  Filled 2011-06-28: qty 30

## 2011-06-28 NOTE — Plan of Care (Signed)
Problem: Inadequate Intake (NI-2.1) Goal: Food and/or nutrient delivery Individualized approach for food/nutrient provision. Outcome: Progressing Nutrition dx: Inadequate oral food intake r/t altered Gi function, poor appetite AEB NPO, 15% wt loss x 1 month.  Pt with 30# weight loss x 1 month, due to nausea, vomiting, and inability to eat. Pt currently NPO, but reports pt he is to be advanced to a clear liquid diet this afternoon. Will follow for diet advancement (consider goal of dysphagia 3 diet, as pt reports difficulty with dry meats and breads).

## 2011-06-28 NOTE — Progress Notes (Signed)
Subjective: Still having some abdominal pain but not as severe as on admission. No nausea. States that he has had vomiting for past month along with abd pain and about a 40 lb weight loss. Ocsasionally has vomited a small amount of bright red blood.  Currently feels a little anxious and is requiring Ativan. Last drink was on Sunday. Has poor urine output with dark urine noted in his urinal.   Objective: Patient Vitals for the past 24 hrs:  BP Temp Temp src Pulse Resp SpO2  06/28/11 1200 167/90 mmHg - - 81  - -  06/28/11 1000 153/85 mmHg - - 75  - -  06/28/11 0643 163/85 mmHg 97.5 F (36.4 C) - 74  20  95 %  06/28/11 0048 190/95 mmHg - - 80  - -  06/28/11 0046 190/95 mmHg - - 80  - -  06/27/11 2300 188/98 mmHg 97.3 F (36.3 C) Oral 80  20  96 %  06/27/11 1800 153/88 mmHg - - 86  - -  06/27/11 1513 156/90 mmHg - - 85  - -  06/27/11 1404 185/102 mmHg 98.6 F (37 C) Oral 78  20  90 %   Weight change:   Intake/Output Summary (Last 24 hours) at 06/28/11 1226 Last data filed at 06/28/11 0600  Gross per 24 hour  Intake      0 ml  Output    800 ml  Net   -800 ml    Physical Exam: General appearance: alert and slightly shaky. Appears slightly slow with slowed response to questions.  Head: Normocephalic, without obvious abnormality, atraumatic Throat: lips, mucosa, and tongue normal; teeth and gums normal Lungs: clear to auscultation bilaterally Heart: regular rate and rhythm, S1, S2 normal, no murmur, click, rub or gallop Abdomen: abnormal findings:  Bowel sounds positive. Non distended. Max tenderness in epigastrium and supraumbilical area. Mild diffuse tenderness throughout the abdomen.  Extremities: extremities normal, atraumatic, no cyanosis or edema Pulses: 2+ and symmetric   Lab Results:  [facility] 06/28/11 0509 06/27/11 0412  NA 134* 132*  K 3.7 3.2*  CL 96 94*  CO2 27 30  GLUCOSE 95 78  BUN 18 11  CREATININE 0.61 0.62  CALCIUM 8.5 8.0*  MG 2.1 --  PHOS 2.6 --     [facility] 06/28/11 0509 06/27/11 0412  AST 94* 98*  ALT 60* 64*  ALKPHOS 131* 131*  BILITOT 3.2* 4.0*  PROT 6.2 5.8*  ALBUMIN 2.7* 2.6*    Basename 06/28/11 0508 06/27/11 0412  LIPASE 110* 605*  AMYLASE -- --    [facility] 06/28/11 0508 06/26/11 1240  WBC 9.1 10.2  NEUTROABS -- 8.6*  HGB 13.2 15.2  HCT 37.9* 44.0  MCV 104.1* 100.7*  PLT 141* 144*   No results found for this basename: CKTOTAL:3,CKMB:3,CKMBINDEX:3,TROPONINI:3 in the last 72 hours No results found for this basename: POCBNP:3 in the last 72 hours No results found for this basename: DDIMER:2 in the last 72 hours No results found for this basename: HGBA1C:2 in the last 72 hours No results found for this basename: CHOL:2,HDL:2,LDLCALC:2,TRIG:2,CHOLHDL:2,LDLDIRECT:2 in the last 72 hours No results found for this basename: TSH,T4TOTAL,FREET3,T3FREE,THYROIDAB in the last 72 hours No results found for this basename: VITAMINB12:2,FOLATE:2,FERRITIN:2,TIBC:2,IRON:2,RETICCTPCT:2 in the last 72 hours  Micro Results: No results found for this or any previous visit (from the past 240 hour(s)).  Studies/Results: US Abdomen Complete  06/27/2011  *RADIOLOGY REPORT*  Clinical Data:  History of abdominal pain.  Nausea.  Vomiting.  ABDOMINAL ULTRASOUND COMPLETE  Comparison:  06/26/2011 CT.  Findings:  Gallbladder: No shadowing gallstones or echogenic sludge. No gallbladder wall thickening or pericholecystic fluid. The gallbladder wall thickness measured 2.3 mm. No sonographic Murphy's sign according to the ultrasound technologist.  CBD: Slightly dilated in caliber measuring 6.5 mm. No choledocholithiasis is evident.  Liver: Upper normal size with increased echogenicity of the parenchymal echotexture without focal parenchymal abnormality. Portal vein is patent with normal hepatopetal flow by color Doppler imaging.  IVC:  Patent throughout its visualized course in the abdomen. Portions of the IVC obscured by bowel gas.  Pancreas:   Although the pancreas is difficult to visualize in its entirety, no focal pancreatic abnormality is identified. Portions of the pancreatic tissue obscured by bowel gas.  Spleen:  Normal size and echotexture without focal abnormality. Length is 6 cm.  Right kidney:  No hydronephrosis.  Well-preserved cortex.  Normal parenchymal echotexture without focal abnormalities.  Right renal length is 12.8 cm.  Left kidney:  No hydronephrosis.  Well-preserved cortex.  Normal parenchymal echotexture without focal abnormalities.  Left renal length is 11.4 cm.  Aorta:  Maximum diameter is  2.1 cm.  No aneurysm is evident. Portions of the aorta were obscured by bowel gas.  Ascites:  None.  IMPRESSION: There is a normal appearance of the gallbladder. Common bile duct is borderline dilated.  No dilatation of branching intrahepatic bile ducts is seen.  No choledocholithiasis is seen. Pancreatic tissue cannot be well visualized because of overlying bowel gas.  No ascites is seen.  There is increased echogenicity and attenuation of the hepatic parenchyma.  This is consistent with fatty infiltration of the liver which was demonstrated on previous CT examination.  No focal hepatic lesion was identified.  No acute process is identified.  Original Report Authenticated By: Delane Ginger, M.D.   Ct Abdomen Pelvis W Contrast  06/26/2011  *RADIOLOGY REPORT*  Clinical Data: Abdominal pain, nausea and vomiting.  Elevated liver function tests.  History of Crohn disease with bowel resections.  CT ABDOMEN AND PELVIS WITH CONTRAST  Technique:  Multidetector CT imaging of the abdomen and pelvis was performed following the standard protocol during bolus administration of intravenous contrast.  Contrast: 130m OMNIPAQUE IOHEXOL 300 MG/ML IV SOLN  Comparison: 11/27/2007  Findings: The lung bases are clear.  There is diffuse low attenuation change throughout the liver consistent with fatty infiltration.  The gallbladder and spleen are unremarkable.  The  stomach is decompressed.  There is infiltration in the abdominal fat around the celiac axis, gastrohepatic and gastrosplenic ligaments, and around the pancreas and duodenum.  Changes suggest inflammatory process.  The duodenum is decompressed and cannot be assessed for wall thickening.  Changes might reflect early acute pancreatitis or gastric/duodenal inflammation.  Prominent lymph nodes are present in the celiac axis and epigastric region with calcifications consistent with chronic granulomatous change.  These nodes are stable.  No free air in the abdomen.  Calcification of the abdominal aorta without aneurysm.  Kidneys are unremarkable. No adrenal gland nodules.  The colon is mostly decompressed with contrast material extending to the rectum.  There appears to be resection of the right colon with ileal colonic anastomoses.  Mild thickening the wall of the terminal ileum suggesting possible residual stricture.  Pelvis:  Calcification of normal size prostate gland.  The bladder wall is not thickened.  No free or loculated pelvic fluid collections.  Scattered pelvic lymph nodes are not pathologically enlarged.  Fat in the inguinal canals.  Mild degenerative changes in the  lumbar spine with normal alignment.  Postoperative changes along the anterior abdominal wall.  IMPRESSION: Diffuse fatty infiltration of the liver.  Inflammatory stranding throughout the upper abdominal fat consistent with inflammatory process such as pancreatitis or gastritis/duodenitis. Postoperative changes of right colon resection and ileocolonic anastomoses.  Thickening of the wall of the terminal ileum which could represent residual Crohn's stricture.  Active inflammation is not excluded.  Original Report Authenticated By: Neale Burly, M.D.    Medications: Scheduled Meds:   . docusate sodium  100 mg Oral BID  . enoxaparin (LOVENOX) injection  40 mg Subcutaneous Q24H  . folic acid  1 mg Oral Daily  . hydrALAZINE      .  hydrALAZINE  10 mg Intravenous Once  . LORazepam  0-4 mg Oral Q6H   Followed by  . LORazepam  0-4 mg Oral Q12H  . multivitamins ther. w/minerals  1 tablet Oral Daily  . pantoprazole  40 mg Oral QAC breakfast  . PARoxetine  20 mg Oral QHS  . predniSONE  20 mg Oral TID  . vitamin B-1  100 mg Oral Daily   Or  . thiamine  100 mg Intravenous Daily  . DISCONTD: heparin  5,000 Units Subcutaneous Q8H  . DISCONTD: pantoprazole (PROTONIX) IV  40 mg Intravenous Q24H   Continuous Infusions:   . sodium chloride 150 mL/hr at 06/28/11 1219   PRN Meds:.acetaminophen, acetaminophen, alprazolam, HYDROmorphone (DILAUDID) injection, LORazepam, LORazepam, ondansetron (ZOFRAN) IV, ondansetron, oxyCODONE, senna, zolpidem  Assessment/Plan: * Acute pancreatitis- improving. Will start ice chips. Will need to increase IVF (maintenence fluids) to 150cc/hr.  Possibly ETOH related  -- chron's stricture noted on CT? May also be active inflammation  *Vomiting and weight loss over the past month- Will need to further evaluate. I have requested a GI consult.  --ultrasound abd negative for cholecystitis and gallstones   *Hypokalemia- replace IV  * ETOH abuse and withdrawl- cont CIWA  *. Elevated LFTs- from ETOH hepatitis?   *. HTN- not on longer term meds- Will order PRN Labetalol (BP has been as high ast 190/95)  *. Chrons Disease- On Prednisone 20 mg TID'  * GERD- PPI  *thrombocytopenia- ETOH related  *Fatty liver  *ileocolic anastomosis  * dumping syndrome      LOS: 2 days   Gary Vasquez 06/28/2011, 12:26 PM

## 2011-06-28 NOTE — Consult Note (Signed)
[id number]

## 2011-06-28 NOTE — Progress Notes (Signed)
INITIAL ADULT NUTRITION ASSESSMENT Date: 06/28/2011   Time: 11:15 AM Reason for Assessment: poor po's, unintentional weight loss, dyshagia  ASSESSMENT: Male 60 y.o.  Dx: Alcohol abuse  Hx:  Past Medical History  Diagnosis Date  . Crohn disease   . Acid reflux   . Hypertension     Related Meds:  Scheduled Meds:   . docusate sodium  100 mg Oral BID  . enoxaparin (LOVENOX) injection  40 mg Subcutaneous Q24H  . folic acid  1 mg Oral Daily  . hydrALAZINE      . hydrALAZINE  10 mg Intravenous Once  . LORazepam  0-4 mg Oral Q6H   Followed by  . LORazepam  0-4 mg Oral Q12H  . multivitamins ther. w/minerals  1 tablet Oral Daily  . pantoprazole  40 mg Oral QAC breakfast  . PARoxetine  20 mg Oral QHS  . predniSONE  20 mg Oral TID  . vitamin B-1  100 mg Oral Daily   Or  . thiamine  100 mg Intravenous Daily  . DISCONTD: heparin  5,000 Units Subcutaneous Q8H  . DISCONTD: pantoprazole (PROTONIX) IV  40 mg Intravenous Q24H   Continuous Infusions:   . sodium chloride 100 mL/hr at 06/28/11 0853   PRN Meds:.acetaminophen, acetaminophen, alprazolam, HYDROmorphone (DILAUDID) injection, LORazepam, LORazepam, ondansetron (ZOFRAN) IV, ondansetron, oxyCODONE, senna, zolpidem   Ht: 6' (182.9 cm)  Wt: 160 lb 7.9 oz (72.8 kg)  Ideal Wt:  77.6 kg % Ideal Wt: 90%  Usual Wt: 190# % Usual Wt: 84%  Body mass index is 21.77 kg/(m^2).  Food/Nutrition Related Hx: Mr. Pavlak is a very nice gentleman who has lost 30# in the past month, due to being unable to eat due to extreme nausea and vomiting. Noted 30# (15%) wt loss are severe weight loss. Noted hx of dumping syndrome in medical records, due to surgery for hiatal hernia. Pt also reports some difficulty swallowing, mostly with tough meats and breads. Denies difficulty swallowing liquids. Pt consumes 5-6 alcoholic drinks daily and has been to AA previously. Pt reports that he is to be advanced to a clear liquid diet this afternoon. Suspect severe  malnutrition due to severe weight loss, alcohol abuse, and poor po intake.   Labs:  CBC    Component Value Date/Time   WBC 9.1 06/28/2011 0508   RBC 3.64* 06/28/2011 0508   HGB 13.2 06/28/2011 0508   HCT 37.9* 06/28/2011 0508   PLT 141* 06/28/2011 0508   MCV 104.1* 06/28/2011 0508   MCH 36.3* 06/28/2011 0508   MCHC 34.8 06/28/2011 0508   RDW 14.9 06/28/2011 0508   LYMPHSABS 0.8 06/26/2011 1240   MONOABS 0.8 06/26/2011 1240   EOSABS 0.0 06/26/2011 1240   BASOSABS 0.0 06/26/2011 1240     CMP     Component Value Date/Time   NA 134* 06/28/2011 0509   K 3.7 06/28/2011 0509   CL 96 06/28/2011 0509   CO2 27 06/28/2011 0509   GLUCOSE 95 06/28/2011 0509   BUN 18 06/28/2011 0509   CREATININE 0.61 06/28/2011 0509   CALCIUM 8.5 06/28/2011 0509   PROT 6.2 06/28/2011 0509   ALBUMIN 2.7* 06/28/2011 0509   AST 94* 06/28/2011 0509   ALT 60* 06/28/2011 0509   ALKPHOS 131* 06/28/2011 0509   BILITOT 3.2* 06/28/2011 0509   GFRNONAA >90 06/28/2011 0509   GFRAA >90 06/28/2011 0509      Intake:   Intake/Output Summary (Last 24 hours) at 06/28/11 1207 Last data filed at 06/28/11  0600  Gross per 24 hour  Intake      0 ml  Output    800 ml  Net   -800 ml     Diet Order: NPO  Supplements/Tube Feeding: none at this time  IVF:    sodium chloride Last Rate: 100 mL/hr at 06/28/11 0853    Estimated Nutritional Needs:   Kcal:2076-2395 kcals daily Protein:108 grams protein daily Fluid:2.1-2.4 L fluid daily  NUTRITION DIAGNOSIS: -Inadequate oral intake (NI-2.1).  Status: Ongoing  RELATED TO: altered GI function, poor appetite  AS EVIDENCE BY: NPO status, 15% weight loss x 1 month.  MONITORING/EVALUATION(Goals): 1) Pt will maintain current weight of 160# 2) Pt will meet >75% of estimated energy needs 3) Pt will advance to PO diet, as medically appropriate  EDUCATION NEEDS: -Education not appropriate at this time  INTERVENTION: Will follow for diet advancement. Consider  advancement to dysphagia 3 diet, due to difficulty with dry meats and breads.   Dietitian #: [id number]  DOCUMENTATION CODES Per approved criteria  -Severe malnutrition in the context of acute illness or injury    Orlene Plum 06/28/2011, 12:01 PM

## 2011-06-28 NOTE — Progress Notes (Signed)

## 2011-06-29 LAB — CBC
HCT: 37.6 % — ABNORMAL LOW (ref 39.0–52.0)
MCH: 36.2 pg — ABNORMAL HIGH (ref 26.0–34.0)
MCHC: 34.6 g/dL (ref 30.0–36.0)
RDW: 14.9 % (ref 11.5–15.5)

## 2011-06-29 LAB — BASIC METABOLIC PANEL
BUN: 19 mg/dL (ref 6–23)
Calcium: 8.9 mg/dL (ref 8.4–10.5)
GFR calc Af Amer: >90 mL/min (ref >90)
GFR calc non Af Amer: >90 mL/min (ref >90)
Glucose, Bld: 95 mg/dL (ref 70–99)
Potassium: 3.9 mEq/L (ref 3.5–5.1)

## 2011-06-29 LAB — HEPATITIS B SURFACE ANTIGEN: Hepatitis B Surface Ag: NEGATIVE

## 2011-06-29 MED ORDER — HYDROMORPHONE HCL 1 MG/ML IJ SOLN
2.0000 mg | INTRAMUSCULAR | Status: DC | PRN
  Administered 2011-06-29: 2 mg via INTRAVENOUS

## 2011-06-29 MED ORDER — HYDROMORPHONE HCL 2 MG/ML IJ SOLN: 2.0000 mg | INTRAMUSCULAR | Status: DC | PRN

## 2011-06-29 MED ORDER — PREDNISONE 20 MG PO TABS
20.0000 mg | ORAL_TABLET | Freq: Every day | ORAL | Status: DC
  Administered 2011-06-30: 20 mg via ORAL
  Filled 2011-06-29: qty 1

## 2011-06-29 NOTE — Progress Notes (Addendum)
Subjective: Since I last evaluated the patient  Says he feel better today. No nausea. He says he is 80% better. Amylase is normal today.  He tells me he has lost about 50 pounds over the last few months due to the nausea.  He has been able to drink however. He admits to drinking about 4-6 "drinks" a day.  He owns 2 bars. He says his urine is usually dark from the MVI that he takes at home.  This is evidently is first episode of pancreatitis.  IV is out at this time.  He denies melena or bright red rectal bleeding. Objective: Vital signs in last 24 hours: Temp:  [97.3 F (36.3 C)-97.6 F (36.4 C)] 97.6 F (36.4 C) (10/31 2133) Pulse Rate:  [61-85] 85  (11/01 0350) Resp:  [20] 20  (10/31 2133) BP: (144-190)/(82-111) 144/82 mmHg (11/01 0350) SpO2:  [92 %-96 %] 92 % (10/31 2133) Last BM Date: 06/28/11  Intake/Output from previous day: 10/31 0701 - 11/01 0700 In: 4752.5 [I.V.:4752.5] Out: 400 [Urine:400] Intake/Output this shift:    General appearance: alert and no distress  Filed Vitals:   06/28/11 1446 06/28/11 2133 06/29/11 0253 06/29/11 0350  BP: 190/111 157/85 185/102 144/82  Pulse: 61 74 75 85  Temp: 97.3 F (36.3 C) 97.6 F (36.4 C)    TempSrc:      Resp:  20    Height:      Weight:      SpO2: 96% 92%      Alert, sluggish. Lungs clear. HR regular. No murmurs heard. Abdomen is soft. He has some tenderness to the epigastric and rt upper quadrant region. No edema to lower extremities. Lab Results:  Basename 06/29/11 0405 06/28/11 0508 06/26/11 1240  WBC 8.6 9.1 10.2  HGB 13.0 13.2 15.2  HCT 37.6* 37.9* 44.0  PLT 177 141* 144*   BMET  Basename 06/29/11 0405 06/28/11 0509 06/27/11 0412  NA 132* 134* 132*  K 3.9 3.7 3.2*  CL 97 96 94*  CO2 24 27 30   GLUCOSE 95 95 78  BUN 19 18 11   CREATININE 0.50 0.61 0.62  CALCIUM 8.9 8.5 8.0*   LFT  Basename 06/28/11 0509  PROT 6.2  ALBUMIN 2.7*  AST 94*  ALT 60*  ALKPHOS 131*  BILITOT 3.2*  BILIDIR --  IBILI --    PT/INR  Basename 06/29/11 0405  LABPROT 14.5  INR 1.11   Hepatitis Panel No results found for this basename: HEPBSAG,HCVAB,HEPAIGM,HEPBIGM in the last 72 hours C-Diff No results found for this basename: CDIFFTOX:3 in the last 72 hours Fecal Lactoferrin No results found for this basename: FECLLACTOFRN in the last 72 hours  Studies/Results: US Abdomen Complete  06/27/2011  *RADIOLOGY REPORT*  Clinical Data:  History of abdominal pain.  Nausea.  Vomiting.  ABDOMINAL ULTRASOUND COMPLETE  Comparison:  06/26/2011 CT.  Findings:  Gallbladder: No shadowing gallstones or echogenic sludge. No gallbladder wall thickening or pericholecystic fluid. The gallbladder wall thickness measured 2.3 mm. No sonographic Murphy's sign according to the ultrasound technologist.  CBD: Slightly dilated in caliber measuring 6.5 mm. No choledocholithiasis is evident.  Liver: Upper normal size with increased echogenicity of the parenchymal echotexture without focal parenchymal abnormality. Portal vein is patent with normal hepatopetal flow by color Doppler imaging.  IVC:  Patent throughout its visualized course in the abdomen. Portions of the IVC obscured by bowel gas.  Pancreas:  Although the pancreas is difficult to visualize in its entirety, no focal pancreatic abnormality is  identified. Portions of the pancreatic tissue obscured by bowel gas.  Spleen:  Normal size and echotexture without focal abnormality. Length is 6 cm.  Right kidney:  No hydronephrosis.  Well-preserved cortex.  Normal parenchymal echotexture without focal abnormalities.  Right renal length is 12.8 cm.  Left kidney:  No hydronephrosis.  Well-preserved cortex.  Normal parenchymal echotexture without focal abnormalities.  Left renal length is 11.4 cm.  Aorta:  Maximum diameter is  2.1 cm.  No aneurysm is evident. Portions of the aorta were obscured by bowel gas.  Ascites:  None.  IMPRESSION: There is a normal appearance of the gallbladder. Common bile duct  is borderline dilated.  No dilatation of branching intrahepatic bile ducts is seen.  No choledocholithiasis is seen. Pancreatic tissue cannot be well visualized because of overlying bowel gas.  No ascites is seen.  There is increased echogenicity and attenuation of the hepatic parenchyma.  This is consistent with fatty infiltration of the liver which was demonstrated on previous CT examination.  No focal hepatic lesion was identified.  No acute process is identified.  Original Report Authenticated By: Delane Ginger, M.D.    Medications: I have reviewed the patient's current medications.  Assessment/Plan: Probably alcoholic pancreatitis. Amylase is normal today. Lipase is down to 114. Feels much better. He was counseled on etoh abuse today.   LOS: 3 days   SETZER,TERRI W 06/29/2011, 9:00 AM  GI attending. Patient reports much less pain today. He is tolerating clear liquids. I talk with Dr. Loralyn [facility] primary care physician. He started on prednisone 60 mg per day on 06/20/2011 the thought that patient may have relapse of his Crohn's disease. Doubt that patient has active Crohn disease and please her symptoms do not appear to be secondary to IBD. Will decrease prednisone dose to 20 mg every morning to be tapered down to 0 after discharge. Discussed with Dr. Wynelle Cleveland  on earlier today

## 2011-06-29 NOTE — Progress Notes (Signed)
Subjective: No abdominal pain. No nausea.  Does not feel anxious and is not craving alcohol.   Objective: Patient Vitals for the past 24 hrs:  BP Temp Temp src Pulse Resp SpO2  06/29/11 1412 163/105 mmHg 97.6 F (36.4 C) Oral 84  18  96 %  06/29/11 0900 140/76 mmHg - - 88  - -  06/29/11 0350 144/82 mmHg - - 85  - -  06/29/11 0253 185/102 mmHg - - 75  - -  06/28/11 2133 157/85 mmHg 97.6 F (36.4 C) - 74  20  92 %  06/28/11 1446 190/111 mmHg 97.3 F (36.3 C) - 61  - 96 %   Weight change:   Intake/Output Summary (Last 24 hours) at 06/29/11 1423 Last data filed at 06/29/11 0340  Gross per 24 hour  Intake 4752.5 ml  Output    400 ml  Net 4352.5 ml    Physical Exam: General appearance:  Alert and oriented and in no acute distress  Head: Normocephalic, without obvious abnormality, atraumatic Throat: lips, mucosa, and tongue normal; teeth and gums normal Lungs: clear to auscultation bilaterally Heart: regular rate and rhythm, S1, S2 normal, no murmur, click, rub or gallop Abdomen:  Bowel sounds positive. Non distended. Mild tenderness in epigastrium and supraumbilical area.. Extremities: extremities normal, atraumatic, no cyanosis or edema Pulses: 2+ and symmetric   Lab Results:  [facility] 06/29/11 0405 06/28/11 0509  NA 132* 134*  K 3.9 3.7  CL 97 96  CO2 24 27  GLUCOSE 95 95  BUN 19 18  CREATININE 0.50 0.61  CALCIUM 8.9 8.5  MG -- 2.1  PHOS -- 2.6    Basename 06/28/11 0509 06/27/11 0412  AST 94* 98*  ALT 60* 64*  ALKPHOS 131* 131*  BILITOT 3.2* 4.0*  PROT 6.2 5.8*  ALBUMIN 2.7* 2.6*    Basename 06/29/11 0405 06/28/11 1341 06/28/11 0508  LIPASE -- 114* 110*  AMYLASE 65 -- --    Basename 06/29/11 0405 06/28/11 0508  WBC 8.6 9.1  NEUTROABS -- --  HGB 13.0 13.2  HCT 37.6* 37.9*  MCV 104.7* 104.1*  PLT 177 141*   No results found for this basename: CKTOTAL:3,CKMB:3,CKMBINDEX:3,TROPONINI:3 in the last 72 hours No results found for this basename: POCBNP:3  in the last 72 hours No results found for this basename: DDIMER:2 in the last 72 hours No results found for this basename: HGBA1C:2 in the last 72 hours No results found for this basename: CHOL:2,HDL:2,LDLCALC:2,TRIG:2,CHOLHDL:2,LDLDIRECT:2 in the last 72 hours No results found for this basename: TSH,T4TOTAL,FREET3,T3FREE,THYROIDAB in the last 72 hours No results found for this basename: VITAMINB12:2,FOLATE:2,FERRITIN:2,TIBC:2,IRON:2,RETICCTPCT:2 in the last 72 hours  Micro Results: No results found for this or any previous visit (from the past 240 hour(s)).  Studies/Results: US Abdomen Complete  06/27/2011  *RADIOLOGY REPORT*  Clinical Data:  History of abdominal pain.  Nausea.  Vomiting.  ABDOMINAL ULTRASOUND COMPLETE  Comparison:  06/26/2011 CT.  Findings:  Gallbladder: No shadowing gallstones or echogenic sludge. No gallbladder wall thickening or pericholecystic fluid. The gallbladder wall thickness measured 2.3 mm. No sonographic Murphy's sign according to the ultrasound technologist.  CBD: Slightly dilated in caliber measuring 6.5 mm. No choledocholithiasis is evident.  Liver: Upper normal size with increased echogenicity of the parenchymal echotexture without focal parenchymal abnormality. Portal vein is patent with normal hepatopetal flow by color Doppler imaging.  IVC:  Patent throughout its visualized course in the abdomen. Portions of the IVC obscured by bowel gas.  Pancreas:  Although the pancreas  is difficult to visualize in its entirety, no focal pancreatic abnormality is identified. Portions of the pancreatic tissue obscured by bowel gas.  Spleen:  Normal size and echotexture without focal abnormality. Length is 6 cm.  Right kidney:  No hydronephrosis.  Well-preserved cortex.  Normal parenchymal echotexture without focal abnormalities.  Right renal length is 12.8 cm.  Left kidney:  No hydronephrosis.  Well-preserved cortex.  Normal parenchymal echotexture without focal abnormalities.   Left renal length is 11.4 cm.  Aorta:  Maximum diameter is  2.1 cm.  No aneurysm is evident. Portions of the aorta were obscured by bowel gas.  Ascites:  None.  IMPRESSION: There is a normal appearance of the gallbladder. Common bile duct is borderline dilated.  No dilatation of branching intrahepatic bile ducts is seen.  No choledocholithiasis is seen. Pancreatic tissue cannot be well visualized because of overlying bowel gas.  No ascites is seen.  There is increased echogenicity and attenuation of the hepatic parenchyma.  This is consistent with fatty infiltration of the liver which was demonstrated on previous CT examination.  No focal hepatic lesion was identified.  No acute process is identified.  Original Report Authenticated By: Delane Ginger, M.D.   Ct Abdomen Pelvis W Contrast  06/26/2011  *RADIOLOGY REPORT*  Clinical Data: Abdominal pain, nausea and vomiting.  Elevated liver function tests.  History of Crohn disease with bowel resections.  CT ABDOMEN AND PELVIS WITH CONTRAST  Technique:  Multidetector CT imaging of the abdomen and pelvis was performed following the standard protocol during bolus administration of intravenous contrast.  Contrast: 189m OMNIPAQUE IOHEXOL 300 MG/ML IV SOLN  Comparison: 11/27/2007  Findings: The lung bases are clear.  There is diffuse low attenuation change throughout the liver consistent with fatty infiltration.  The gallbladder and spleen are unremarkable.  The stomach is decompressed.  There is infiltration in the abdominal fat around the celiac axis, gastrohepatic and gastrosplenic ligaments, and around the pancreas and duodenum.  Changes suggest inflammatory process.  The duodenum is decompressed and cannot be assessed for wall thickening.  Changes might reflect early acute pancreatitis or gastric/duodenal inflammation.  Prominent lymph nodes are present in the celiac axis and epigastric region with calcifications consistent with chronic granulomatous change.  These  nodes are stable.  No free air in the abdomen.  Calcification of the abdominal aorta without aneurysm.  Kidneys are unremarkable. No adrenal gland nodules.  The colon is mostly decompressed with contrast material extending to the rectum.  There appears to be resection of the right colon with ileal colonic anastomoses.  Mild thickening the wall of the terminal ileum suggesting possible residual stricture.  Pelvis:  Calcification of normal size prostate gland.  The bladder wall is not thickened.  No free or loculated pelvic fluid collections.  Scattered pelvic lymph nodes are not pathologically enlarged.  Fat in the inguinal canals.  Mild degenerative changes in the lumbar spine with normal alignment.  Postoperative changes along the anterior abdominal wall.  IMPRESSION: Diffuse fatty infiltration of the liver.  Inflammatory stranding throughout the upper abdominal fat consistent with inflammatory process such as pancreatitis or gastritis/duodenitis. Postoperative changes of right colon resection and ileocolonic anastomoses.  Thickening of the wall of the terminal ileum which could represent residual Crohn's stricture.  Active inflammation is not excluded.  Original Report Authenticated By: WNeale Burly M.D.    Medications: Scheduled Meds:    . docusate sodium  100 mg Oral BID  . enoxaparin (LOVENOX) injection  40 mg Subcutaneous  F02O  . folic acid  1 mg Oral Daily  . hydrALAZINE      . LORazepam  0-4 mg Intravenous Q6H   Followed by  . LORazepam  0-4 mg Intravenous Q12H  . multivitamins ther. w/minerals  1 tablet Oral Daily  . pantoprazole  40 mg Oral QAC breakfast  . PARoxetine  20 mg Oral QHS  . potassium chloride  10 mEq Intravenous Q1 Hr x 4  . predniSONE  20 mg Oral TID  . vitamin B-1  100 mg Oral Daily   Or  . thiamine  100 mg Intravenous Daily  . DISCONTD: LORazepam  0-4 mg Oral Q6H  . DISCONTD: LORazepam  0-4 mg Oral Q12H   Continuous Infusions:    . sodium chloride Stopped  (06/29/11 0340)   PRN Meds:.acetaminophen, acetaminophen, alprazolam, hydrALAZINE, HYDROmorphone (DILAUDID) injection, HYDROmorphone (DILAUDID) injection, labetalol, LORazepam, LORazepam, LORazepam, LORazepam, ondansetron (ZOFRAN) IV, ondansetron, oxyCODONE, senna, zolpidem, DISCONTD:  HYDROmorphone (DILAUDID) injection, DISCONTD:  HYDROmorphone (DILAUDID) injection  Assessment/Plan: * Acute pancreatitis- improving. Will advance to clear liquids. He apparently pulled his IV out last night and therefore for now I will not give him any IV fluid .  *Vomiting and weight loss over the past month- may be related to subacute pancreatitis versus gastritis. We'll continue to monitor his food intake while he is in the hospital   *Hypokalemia- replaced IV  * ETOH abuse and withdrawl- cont CIWA- he has not required any Ativan lately  *. Elevated LFTs- from ETOH hepatitis.  *. HTN- not on longer term meds- he is receiving when necessary labetalol *. Chrons Disease- status post bowel resection  * GERD- PPI  *thrombocytopenia- ETOH related  *Fatty liver  *ileocolic anastomosis  * dumping syndrome      LOS: 3 days   [place] Gipe ANWAR 06/29/2011, 2:23 PM

## 2011-06-29 NOTE — Progress Notes (Signed)
Ok to leave IV out at this time per Dr. Butler Denmark.

## 2011-06-29 NOTE — Progress Notes (Signed)
CSW met with pt due to alcohol abuse.  Note and SBIRT in shadow chart.   Gary Vasquez

## 2011-06-29 NOTE — Consult Note (Signed)
Gary Vasquez, Gary Vasquez NO.:  [id number]  MEDICAL RECORD NO.:  [id number]  LOCATION:  A212                          FACILITY:  [facility]  PHYSICIAN:  Hildred Laser, M.D.    DATE OF BIRTH:  Aug 06, 1951  DATE OF CONSULTATION:  06/28/2011 DATE OF DISCHARGE:                                CONSULTATION   REASON FOR CONSULTATION: 1. Acute pancreatitis. 2. Elevated transaminases.  HISTORY OF PRESENT ILLNESS:  The patient is a 60 year old Caucasian male with very complicated GI history which was reviewed under past medical history, who has not been feeling well for at least a month.  He has chronic dumping syndrome and he felt his dumping syndrome i.e. postprandial nausea, bloating, and lightheadedness got worse.  He also noted drop in his appetite.  He states he has lost 40-50 pounds in the last 6 weeks.  He states his baseline weight is around 190 pounds.  On admission, he was 160 pounds.  He also has been experiencing intermittent nausea and vomiting.  He generally would vomit food and bile, but no blood.  He has not experienced any diarrhea, constipation, melena, or rectal bleeding.  He also denies heartburn, dysphagia, shortness of breath or chest pain,but  he has been experiencing intermittent night sweats.  He noted his urine to be dark but he thought it was because he starts to take some vitamins.  He came to emergency room 2 days ago with progressive weakness and  3-4 day history of pain across his upper abdomen.  The patient has been consuming whisky in excessive amounts.  He says the last time he had any alcoholic drink was 1 day before he came to the emergency room. Evaluation emergency room revealed elevated bilirubin and transaminases. He had abdominopelvic CT which was abnormal revealing inflammatory changes throughout the upper abdomen.  Consistent with either pancreatitis or gastroduodenitis.  Also noted to have postoperative changes of right colon  resection.  There was some thickening to the neoterminal ileum, but no localized fluid collection was noted to suggest an abscess.  The patient was admitted to Hospitalist Service. He was begun on IV fluids, PPI and Dilaudid for pain control, as well as lorazepam for alcohol withdrawal.  He is also on maintenance prednisone.  The patient states that he saw Dr. Roger Kill about 3 weeks ago and he was begun on prednisone, although the patient is not sure as to the reason for this treatment.  The patient states that he is having less pain across his upper abdomen. He has not had any nausea or vomiting today.  He is hungry and wants to eat.  Presently he is on ice chips only.  HOME MEDS:  He has been on alprazolam, paroxetine, prednisone, vitamins, doses unknown.  He also uses Aleve no more than once or twice a week.  CURRENT MEDICATIONS: 1. He is on IV normal saline at 150 mL/h. 2. Lovenox 40 mg subcu q.24 hours. 3. Folic acid 1 mg p.o. daily. 4. Lorazepam 0-4 mg IV per protocol. 5. Pantoprazole 40 mg p.o. q.a.m. 6. Paroxetine 20 mg p.o. daily. 7. Prednisone 20 mg 3 times a day.  P.R.N.MEDICATIONS: 1. Acetaminophen 650 mg  p.o. or p.r. q.6h p.r.n. 2. Alprazolam 0.5-1 mg b.i.d. p.r.n. 3. Hydralazine 10 mg IV q.4 if systolic blood pressure greater than     170. 4.  Dilaudid 2 mg IV q.4. 4. Labetalol 10 mg IV q.2 p.r.n. if systolic blood pressure greater     than 170. 5. Zofran 4 mg IV or p.o. q.6 h. p.r.n. 6. Oxycodone, senna, and zolpidem p.r.n.  PAST SURGERY HISTORY:  He has history of Crohn disease.  He had right hemicolectomy at [facility] in [place] over 20 years ago he had I and D for perianal abscess.   Since his surgery his Crohn disease has been remission.  He has not been on any maintenance therapy.  He had a colonoscopy by me in August 2006 and he had single erosion at the ileocolonic anastomosis and biopsy showed chronic inflammation and granulation tissue, but no  evidence of granulomas.  He has chronic GERD.  He had lap Nissen at [facility] in 2002 but his hernia reoccurred.  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 He had repair for large paraesophageal hernia, short Nissen fundoplication redo.  Extensive lysis of adhesions as well as laparoscopic pyloromyotomy since he had very abnormal delayed gastric emptying.  His hospital course was very complicated.  He developed aspiration pneumonia.  He developed gastric outlet obstruction.  He also developed increased output from one of his drains on postop day 2 suggestive of a small leak and he was treated conservatively.  He required parenteral nutrition.  He had repeat surgery on December 26, 2010 and he had laparoscopic placement of feeding jejunostomy and laparoscopic gastrojejunostomy.  He was hospitalized for several weeks.  Since all of the surgeries he has develop a dumping syndrome.  He was also seen by Dr. Yvone Neu, [facility] in May last year.  The patient has history of kidney stones but not recently. He tells me that he had peptic ulcer disease at age 77 but was not found to have a ulcer on the EGD of August, 2006 at [facility] and another 1 on January 2007.  He has hypertension.  History of alcohol abuse.  He states he was admitted for detox in 2007 and did not drink any alcohol until 2010.  He has history of anxiety and neurosis.  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  ALLERGIES:  NK.  FAMILY HISTORY:  Father is 23 and doing very well.  Mother is 70 and also in good health.  He has 2 brothers 92 is age 5 and had a CABG 2 months ago, another 18 is 58 year old and has congenital blindness due to optic nerve defect.  SOCIAL HISTORY:  He has been married and divorced 3x.  He has a daughter aged 43 and she lives with him.  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  He smoked about a pack a day.  Starting at age 25 and smoked for 20 years and since then he has been smoking off and on.  Presently smoking couple of cigarettes every few days.  He has been drinking alcohol off and on for 30 years.  For the last several months he has been drinking at least 5-6 ounces of whisky everyday.  Admission weight 160.7 pounds, he is 72 inches tall.  Pulse 74 per minute, blood pressure 157/85 respirations 20 and  temp is 97.6. The patient is awake alert and he does not appear to be any distress. Conjunctivae is pink.  Sclera is mildly icteric.  Oropharyngeal mucosa is normal.  No neck masses or thyromegaly noted.  He does not have spider angiomata.  Cardiac exam with regular rhythm.  Normal S1, S2.  No murmur or gallop noted.  Lungs are clear to auscultation.  Abdomen is full with well-healed scars.  Bowel sounds are hyperactive.  On palpation, abdomen is soft with generalized tenderness which is mild across lower half, but moderate with guarding across the upper abdomen. No organomegaly or masses noted.  Extremities.  Without clubbing or edema.  LAB DATA FROM ADMISSION:  WBC 10.2, H and H 15.2 and 44, MCV 100.7, platelet count 144k, serum sodium 130, potassium 3.1 chloride 85, CO2 31, glucose 132, BUN 13, creatinine 0.73, calcium 9.6, total bilirubin 4.1, AP 164, AST 162, ALT 104, total protein 7.9 with albumin of 3.7. Serum lipase on admission was 14 32.  LAB DATA FROM THIS MORNING:  WBC is 9.1, H and H 13.2 and 37.9, platelet count is 141K.  Serum lipase 114, serum calcium is 8.5, total bilirubin 3.2, AP 131 AST 94, ALT 60, serum albumin is 2.7.  BUN is 18, creatinine 0.61. Abdominopelvic CT reviewed.  He has peri-pancreatic inflammatory changes.  Some thickening to the terminal ileum proximal to the ileocolonic anastomosis.  No surrounding fluid collection noted.  No stones noted in the gallbladder.  Ultrasound from yesterday reveals  no evidence of cholelithiasis and bile duct was 6.5 mm.  Fatty liver, but no definite changes of pancreatitis colitis noted.  ASSESSMENT:  The patient is a 60 year old Caucasian male with complicated GI history, who presents with over a month history of nausea, vomiting, anorexia, weight loss, and few day history of upper abdominal pain.  CT scan biochemical studies suggest acute pancreatitis. He also has hyperbilirubinemia and elevated transaminases.  AST greater than ALT.  Imaging studies are negative for biliary ductal dilation or cholelithiasis. Given history of excessive intake of alcohol, would appear that has not only alcoholic pancreatitis he also has alcoholic hepatitis.  There is no evidence that his pancreatitis, biliary tract in origin of from other etiologies.  He appears to be gradually improving.  However, he is not ready for oral feeding.  History of Crohn disease.  This has been inactive for many years.  He has some thickening to terminal ileum and this may indicate relapse of his Crohn disease but I do not believe that, he has have any symptoms pertaining to this.  The patient currently is on 60 mg of prednisone daily.  RECOMMENDATIONS: 1. Agree with leaving on ice chips for now. 2. We will repeat his serum amylase along with lipase in a.m. and will     also do hepatitis B surface antigen, hepatitis C virus antibody. 3. Consider reducing the dose of prednisone to 20 mg daily or whatever     dose he was on before and he could be gradually tapered off it.  I      do not see any indication to treat him with prednisone at present     time. 4. We appreciate the opportunity to participate in the care this     gentleman.          ______________________________ Hildred Laser, M.D.     NR/MEDQ  D:  06/28/2011  T:  06/28/2011  Job:  [id number]

## 2011-06-30 LAB — HEPATIC FUNCTION PANEL
ALT: 65 U/L — ABNORMAL HIGH (ref 0–53)
AST: 74 U/L — ABNORMAL HIGH (ref 0–37)
Albumin: 2.7 g/dL — ABNORMAL LOW (ref 3.5–5.2)
Alkaline Phosphatase: 152 U/L — ABNORMAL HIGH (ref 39–117)
Total Bilirubin: 2.3 mg/dL — ABNORMAL HIGH (ref 0.3–1.2)
Total Protein: 6.3 g/dL (ref 6.0–8.3)

## 2011-06-30 MED ORDER — PREDNISONE 10 MG PO TABS: 20.0000 mg | ORAL_TABLET | Freq: Every day | ORAL | Status: AC

## 2011-06-30 NOTE — Discharge Planning (Addendum)
DISCHARGE SUMMARY  Gary Vasquez  MR#: [id number]  DOB:[birth year]  Date of Admission: 06/26/2011 Date of Discharge: 06/30/2011  Attending Physician:Nikiesha Milford Rowe Pavy  Patient's TUU:EKCM,KLKJZP J, MD  Consults:Treatment Team:  Rogene Houston, MD- GI  Presenting Complaint: Nausea vomiting and abdominal pain  Discharge Diagnoses:   Alcoholic Pancreatitis Fatty liver  Transaminitis  Alcohol abuse and alcohol withdrawal  Gastroesophageal reflux disease  Hypertension      Discharge Medications: Current Discharge Medication List    CONTINUE these medications which have CHANGED   Details  predniSONE (DELTASONE) 10 MG tablet Take 2 tablets (20 mg total) by mouth daily before breakfast. Take 20 mg (two tabs) for 3 days.  On 11/5 please decrease to 10 mg (one tab) daily until 11/10.  On 11/10 decrease to 5 mg (half tab) daily for 4 more days. Qty: 15 tablet, Refills: 0   Associated Diagnoses: Personal history of unspecified digestive disease      CONTINUE these medications which have NOT CHANGED   Details  alprazolam (XANAX) 2 MG tablet Take 0.5-1 mg by mouth 2 (two) times daily as needed. For anxiety     cetirizine (ZYRTEC) 10 MG tablet Take 10 mg by mouth daily.      ibuprofen (ADVIL,MOTRIN) 200 MG tablet Take 200 mg by mouth every 6 (six) hours as needed. inflammation/pain     PARoxetine (PAXIL) 20 MG tablet Take 20 mg by mouth at bedtime.          Study Result      CT ABDOMEN AND PELVIS WITH CONTRAST  Diffuse fatty infiltration of the liver. Inflammatory stranding  throughout the upper abdominal fat consistent with inflammatory  process such as pancreatitis or gastritis/duodenitis.  Postoperative changes of right colon resection and ileocolonic  anastomoses. Thickening of the wall of the terminal ileum which  could represent residual Crohn's stricture. Active inflammation is  not excluded.   US Abdomen Complete There is a normal appearance of the  gallbladder.  Common bile duct is borderline dilated. No dilatation of branching  intrahepatic bile ducts is seen. No choledocholithiasis is seen.  Pancreatic tissue cannot be well visualized because of overlying  bowel gas. No ascites is seen.  There is increased echogenicity and attenuation of the hepatic  parenchyma. This is consistent with fatty infiltration of the  liver which was demonstrated on previous CT examination. No focal  hepatic lesion was identified. No acute process is identified.    Hospital Course:  1. Alcoholic Pancreatitis- This 60 year old male who is a heavy drinker who presented to the emergency room with a complaint of abdominal pain and vomiting. He was diagnosed with acute pancreatitis based on exam blood work and CT findings. Patient did not exhibit any gallstones on CT. Abdominal ultrasound was also performed which was negative for gallstones. Etiology for the pancreatitis is suspected to be alcohol abuse. He may also have alcoholic gastritis superimposed upon this. With appropriate treatment his pancreatitis has resolved. He is now able to tolerate a low-fat diet. He is advised to discontinue drinking. And then to continue on a healthy diet.  2. Fatty liver- this was noted on CAT scan and ultrasound and is likely secondary to chronic alcohol abuse  3. Alcohol abuse and alcohol withdrawal -patient has received day and alcohol abuse consult. He's been counseled regarding his alcohol abuse and at this point he states that he will try to abstain from alcohol  4. Gastroesophageal reflux disease -this was part of his past medical history. He  was not on any proton pump inhibitors as an outpatient. For now will let his primary care physician decide whether or not he needs to be medicated for this.   5.Hypertension -his blood pressure was also noted to be high. He is not on any antihypertensives as an outpatient. He is advised to follow up with his primary care physician  and be placed on appropriate medications if needed.   Day of Discharge BP 155/98  Pulse 84  Temp(Src) 97.6 F (36.4 C) (Oral)  Resp 18  Ht 6' (1.829 m)  Wt 72.8 kg (160 lb 7.9 oz)  BMI 21.77 kg/m2  SpO2 97%  Physical Exam: Gen.: He is awake alert oriented x3 and in no acute distress. Lungs: Clear to auscultation bilaterally CVS: Regular rate and rhythm- no murmurs rubs or gallops.  Abdomen: Soft nontender nondistended bowel sounds positive.   Results for orders placed during the hospital encounter of 06/26/11 (from the past 24 hour(s))  HEPATIC FUNCTION PANEL     Status: Abnormal   Collection Time   06/30/11  4:30 AM      Component Value Range   Total Protein 6.3  6.0 - 8.3 (g/dL)   Albumin 2.7 (*) 3.5 - 5.2 (g/dL)   AST 74 (*) 0 - 37 (U/L)   ALT 65 (*) 0 - 53 (U/L)   Alkaline Phosphatase 152 (*) 39 - 117 (U/L)   Total Bilirubin 2.3 (*) 0.3 - 1.2 (mg/dL)   Bilirubin, Direct 1.1 (*) 0.0 - 0.3 (mg/dL)   Indirect Bilirubin 1.2 (*) 0.3 - 0.9 (mg/dL)    Disposition: Stable   Follow-up Appts: Followup with Dr. Cleta Alberts in one week    Signed: Wonda Cerise 06/30/2011, 3:35 PM

## 2011-06-30 NOTE — Progress Notes (Signed)
Subjective: Patient feels much better today; he had no pain with his meals; he did experience diarrhea9 dumping syndrome)  Objective: Patient is afebrile. Abdomen is soft and non tender; no organomegaly.  Lab data Bili 2.3; AP 152, AST 74, ALT 65, alb 2.7. HbsAg neg. HCV antibody neg.  ASSESSMENT; #1. Alcoholic pancreatitis; patient doing well and tolerating diet; agree with plans for discharge. #2. Elevated Transaminases sec to excessive alcohol intake. Bili down but Transaminases seemed to level of; it may be few weeks before they normalize. Will ask Dr. Cleta Alberts to repeat these on his next visit. #3. ? Ileitis; not convinced he has active disease; Prednisone can be tapered over the next 2 weeks or so. Will be glad to see him as outpatient if Dr. Cleta Alberts wants me to.       Nieves Barberi U 06/30/2011, 4:01 PM

## 2011-06-30 NOTE — Progress Notes (Signed)
Discharge instructions and prescriptions given, verbalized understanding, out ambulatory in stable condition with staff. 

## 2011-07-12 NOTE — Discharge Summary (Signed)
DISCHARGE SUMMARY  Gary Vasquez  MR#: [id number]  DOB:1950-11-16  Date of Admission: 06/26/2011 Date of Discharge: 07/12/2011  Attending Physician:Jishnu Jenniges  Patient's GYI:RSWN,IOEVOJ J, MD  Consults:Treatment Team:  Rogene Houston, MD- GI  Presenting Complaint: Abdominal pain  Discharge Diagnoses:  AcuteAlcoholic Pancreatitis- Alcohol abuse Transaminitis due to Alcoholic hepatitis Crohns Disease GERD HTN    Discharge Medications: Discharge Medication List as of 06/30/2011  4:38 PM    CONTINUE these medications which have CHANGED   Details  predniSONE (DELTASONE) 10 MG tablet Take 2 tablets (20 mg total) by mouth daily before breakfast. Take 20 mg (two tabs) for 3 days.  On 11/5 please decrease to 10 mg (one tab) daily until 11/10.  On 11/10 decrease to 5 mg (half tab) daily for 4 more days., Starting 06/30/2011, Until Mon 1 09/08/10, Print      CONTINUE these medications which have NOT CHANGED   Details  alprazolam (XANAX) 2 MG tablet Take 0.5-1 mg by mouth 2 (two) times daily as needed. For anxiety , Until Discontinued, Historical Med    cetirizine (ZYRTEC) 10 MG tablet Take 10 mg by mouth daily.  , Until Discontinued, Historical Med    ibuprofen (ADVIL,MOTRIN) 200 MG tablet Take 200 mg by mouth every 6 (six) hours as needed. inflammation/pain , Until Discontinued, Historical Med    PARoxetine (PAXIL) 20 MG tablet Take 20 mg by mouth at bedtime.  , Until Discontinued, Historical Med         Procedures: US Abdomen Complete  06/27/2011  *RADIOLOGY REPORT*  Clinical Data:  History of abdominal pain.  Nausea.  Vomiting.  ABDOMINAL ULTRASOUND COMPLETE  Comparison:  06/26/2011 CT.  Findings:  Gallbladder: No shadowing gallstones or echogenic sludge. No gallbladder wall thickening or pericholecystic fluid. The gallbladder wall thickness measured 2.3 mm. No sonographic Murphy's sign according to the ultrasound technologist.  CBD: Slightly dilated in caliber  measuring 6.5 mm. No choledocholithiasis is evident.  Liver: Upper normal size with increased echogenicity of the parenchymal echotexture without focal parenchymal abnormality. Portal vein is patent with normal hepatopetal flow by color Doppler imaging.  IVC:  Patent throughout its visualized course in the abdomen. Portions of the IVC obscured by bowel gas.  Pancreas:  Although the pancreas is difficult to visualize in its entirety, no focal pancreatic abnormality is identified. Portions of the pancreatic tissue obscured by bowel gas.  Spleen:  Normal size and echotexture without focal abnormality. Length is 6 cm.  Right kidney:  No hydronephrosis.  Well-preserved cortex.  Normal parenchymal echotexture without focal abnormalities.  Right renal length is 12.8 cm.  Left kidney:  No hydronephrosis.  Well-preserved cortex.  Normal parenchymal echotexture without focal abnormalities.  Left renal length is 11.4 cm.  Aorta:  Maximum diameter is  2.1 cm.  No aneurysm is evident. Portions of the aorta were obscured by bowel gas.  Ascites:  None.  IMPRESSION: There is a normal appearance of the gallbladder. Common bile duct is borderline dilated.  No dilatation of branching intrahepatic bile ducts is seen.  No choledocholithiasis is seen. Pancreatic tissue cannot be well visualized because of overlying bowel gas.  No ascites is seen.  There is increased echogenicity and attenuation of the hepatic parenchyma.  This is consistent with fatty infiltration of the liver which was demonstrated on previous CT examination.  No focal hepatic lesion was identified.  No acute process is identified.  Original Report Authenticated By: Delane Ginger, M.D.   Ct Abdomen Pelvis W Contrast  06/26/2011  *RADIOLOGY REPORT*  Clinical Data: Abdominal pain, nausea and vomiting.  Elevated liver function tests.  History of Crohn disease with bowel resections.  CT ABDOMEN AND PELVIS WITH CONTRAST  Technique:  Multidetector CT imaging of the abdomen  and pelvis was performed following the standard protocol during bolus administration of intravenous contrast.  Contrast: 164m OMNIPAQUE IOHEXOL 300 MG/ML IV SOLN  Comparison: 11/27/2007  Findings: The lung bases are clear.  There is diffuse low attenuation change throughout the liver consistent with fatty infiltration.  The gallbladder and spleen are unremarkable.  The stomach is decompressed.  There is infiltration in the abdominal fat around the celiac axis, gastrohepatic and gastrosplenic ligaments, and around the pancreas and duodenum.  Changes suggest inflammatory process.  The duodenum is decompressed and cannot be assessed for wall thickening.  Changes might reflect early acute pancreatitis or gastric/duodenal inflammation.  Prominent lymph nodes are present in the celiac axis and epigastric region with calcifications consistent with chronic granulomatous change.  These nodes are stable.  No free air in the abdomen.  Calcification of the abdominal aorta without aneurysm.  Kidneys are unremarkable. No adrenal gland nodules.  The colon is mostly decompressed with contrast material extending to the rectum.  There appears to be resection of the right colon with ileal colonic anastomoses.  Mild thickening the wall of the terminal ileum suggesting possible residual stricture.  Pelvis:  Calcification of normal size prostate gland.  The bladder wall is not thickened.  No free or loculated pelvic fluid collections.  Scattered pelvic lymph nodes are not pathologically enlarged.  Fat in the inguinal canals.  Mild degenerative changes in the lumbar spine with normal alignment.  Postoperative changes along the anterior abdominal wall.  IMPRESSION: Diffuse fatty infiltration of the liver.  Inflammatory stranding throughout the upper abdominal fat consistent with inflammatory process such as pancreatitis or gastritis/duodenitis. Postoperative changes of right colon resection and ileocolonic anastomoses.  Thickening of the  wall of the terminal ileum which could represent residual Crohn's stricture.  Active inflammation is not excluded.  Original Report Authenticated By: WNeale Burly M.D.   Hospital Course: WYASSEEN SALLSis an 60y.o. male. With h/o Crohn's disease s/p surgery, GERD s/p Nissen fundoplication and resultant dumping syndrome who presented  with 1 mo h/o nausea, vomiting, and abd pain, worsening in the past 4-5 days prior to admission. He came to the ED and was found to have lipase of 1400 and elevated LFT's.   Acute Alcoholic Pancreatitis Was treated w/ IVF and NPO status and resolved. CT and ultrasound revealed the the pancreatitis was not gallstone related (see above results) and therefore was likely due to his ongoing alcohol abuse.  He, in addition, complained of abdominal pain and nausea which had been occuring over the past month and had led to some weight loss. This is also likely related to ETOH abuse and associated alcoholic hepatitis and gastritis. He was started on Predinisone by his PCP due to a concern that he was having a Crohns flare. I did request a GI consult for these complaints and he was evalulated by Dr NHildred Laser After his evaluation, he recommended that the Prednisone be tapered as he did not feel the patient is having a Crohns flare. There is some thickening noted in the terminal ileum on the CT however, he does not feel that the patient's current symptoms pertain to this finding. I have therefore written out a prednisione taper upon discharge.   Day of Discharge  BP 155/98  Pulse 84  Temp(Src) 97.6 F (36.4 C) (Oral)  Resp 18  Ht 6' (1.829 m)  Wt 72.8 kg (160 lb 7.9 oz)  BMI 21.77 kg/m2  SpO2 97%  Physical Exam: General appearance: Alert and oriented and in no acute distress  Head: Normocephalic, without obvious abnormality, atraumatic  Throat: lips, mucosa, and tongue normal; teeth and gums normal  Lungs: clear to auscultation bilaterally  Heart: regular rate  and rhythm, S1, S2 normal, no murmur, click, rub or gallop  Abdomen: Bowel sounds positive. Non distended. Mild tenderness in epigastrium and supraumbilical area..  Extremities: extremities normal, atraumatic, no cyanosis or edema Pulses: 2+ and symmetric    No results found for this or any previous visit (from the past 24 hour(s)).  Disposition: Stable  Follow-up with Dr.Halm in 2 week  Signed: [facility] 07/12/2011, 10:25 AM

## 2011-09-20 ENCOUNTER — Encounter (INDEPENDENT_AMBULATORY_CARE_PROVIDER_SITE_OTHER): Payer: Self-pay | Admitting: *Deleted

## 2011-10-02 ENCOUNTER — Ambulatory Visit (INDEPENDENT_AMBULATORY_CARE_PROVIDER_SITE_OTHER): Payer: Self-pay | Admitting: Internal Medicine

## 2011-10-02 ENCOUNTER — Encounter (INDEPENDENT_AMBULATORY_CARE_PROVIDER_SITE_OTHER): Payer: Self-pay | Admitting: Internal Medicine

## 2011-10-02 ENCOUNTER — Other Ambulatory Visit (INDEPENDENT_AMBULATORY_CARE_PROVIDER_SITE_OTHER): Payer: Self-pay | Admitting: Internal Medicine

## 2011-10-02 VITALS — BP 92/60 | HR 72 | Temp 97.7°F | Ht 72.0 in | Wt 157.3 lb

## 2011-10-02 DIAGNOSIS — K7030 Alcoholic cirrhosis of liver without ascites: Secondary | ICD-10-CM

## 2011-10-02 MED ORDER — SPIRONOLACTONE 50 MG PO TABS: 50.0000 mg | ORAL_TABLET | Freq: Two times a day (BID) | ORAL | Status: DC

## 2011-10-02 MED ORDER — FUROSEMIDE 20 MG PO TABS: 20.0000 mg | ORAL_TABLET | ORAL | Status: DC

## 2011-10-02 NOTE — Patient Instructions (Signed)
F/u one month with a Hepatic profile. CBC, PT/INR today.CRP, Hepatic profile today.

## 2011-10-02 NOTE — Progress Notes (Signed)
Subjective:     Patient ID: Gary Vasquez, male   DOB: 1951/05/31, 61 y.o.   MRN: [id number]  HPI  Gary Vasquez is here today for a scheduled visit. He was referred to our office by Dr. Cleta Alberts. He has a hx of alcoholic hepatitis and Crohn's disease. He was recently at [facility] in December and d/c in January.  He was admitted for liver failure, pancreatitis, alcoholic hepatitis and a closed head injury from a fall. He was in the hospital for approximately 6 weeks.  Will try to locate these records.  He tells me when the injury occurred he had been drinking.  He has not had etoh since December.  He had been drinking approximately a gallon of liquor a weeks.  Since his discharge he says he is recovering.  He says it has not bothered him that he has not drank. Appetite is okay. No weight loss recently.  He says he abdominal pain. He says every times he eats he bloats.  He has stomach spasms.  He usually has a BM 1-2 a day.  No diarrhea.  No melena or bright red rectal bleeding.  Colonoscopy in 2006 Dr. Laural Golden: 6. Single erosion at the ileal colonic anastomosis. Biopsy taken from this  area. Neoterminal ileum for at least 20 cm were normal.  7. As far as his Crohn's disease is concerned, he appears to be in  remission.    Hx of severe GERD with aspirating pneumona.  At age 27 he had a colon resections for Crohn's disease.   He also has had surgery for reflux disease.   06/26/11 CT abd/pelvis with CMIMPRESSION:  Diffuse fatty infiltration of the liver. Inflammatory stranding  throughout the upper abdominal fat consistent with inflammatory  process such as pancreatitis or gastritis/duodenitis.  Postoperative changes of right colon resection and ileocolonic  anastomoses. Thickening of the wall of the terminal ileum which  could represent residual Crohn's stricture. Active inflammation   Hepatitis Ba surface Antigen negative. Hepatitis C antibody negative. 11/2: ALP 152, AST 74, ALT 65, H and H 13 and  37.6, total bili 2.3, albumin 2.7 Review of Systems  see hpi    Current Outpatient Prescriptions  Medication Sig Dispense Refill  . cetirizine (ZYRTEC) 10 MG tablet Take 10 mg by mouth daily.        Marland Kitchen ibuprofen (ADVIL,MOTRIN) 200 MG tablet Take 200 mg by mouth every 6 (six) hours as needed. inflammation/pain       . alprazolam (XANAX) 2 MG tablet Take 0.5-1 mg by mouth 2 (two) times daily as needed. For anxiety       . PARoxetine (PAXIL) 20 MG tablet Take 20 mg by mouth at bedtime.         Family Status  Relation Status Death Age  . Mother Alive     good health  . Father Alive     good health  . Brother Alive     good health   History   Social History  . Marital Status: Divorced    Spouse Name: N/A    Number of Children: N/A  . Years of Education: N/A   Occupational History  . Not on file.   Social History Main Topics  . Smoking status: Current Some Day Smoker  . Smokeless tobacco: Not on file   Comment: 2-3 cigarrettes a week  . Alcohol Use: Yes     daily liquor  . Drug Use: No  . Sexually Active: Not on file  Other Topics Concern  . Not on file   Social History Narrative  . No narrative on file   Past Surgical History  Procedure Date  . Nissen fundoplication   . Abdominal surgery    No Known Allergies  Objective:   Physical Exam  Filed Vitals:   10/02/11 1503  Height: 6' (1.829 m)  Weight: 157 lb 4.8 oz (71.351 kg)    Alert and oriented. Skin warm and dry. Oral mucosa is moist.   . Sclera icteric, conjunctivae is pink. Thyroid not enlarged. No cervical lymphadenopathy. Lungs clear. Heart regular rate and rhythm.  Abdomen is soft, distended.  Bowel sounds are positive. No hepatomegaly. No abdominal masses felt. No tenderness.  2-3 + edema to lower extremities.  Patient is alert and oriented.     Assessment:   alcoholic hepatitis with elevated transaminases, GERD, Crohn's which appears to be in remission at this time.  Discussed with Dr. Laural Golden      Plan:    Will repeat Hepatic profile, CRP , and a CBC, PT/INR. Further recommendations once we have the lab work back.  CBC, PT/INR, Hepatic profile, CRP. Lasix 40m every other day. Spironolactone 586mtwice a day. OV in 1 month with an hepatic profile. Zantac 15068maily.

## 2011-10-03 LAB — CBC WITH DIFFERENTIAL/PLATELET
Lymphocytes Relative: 25 % (ref 12–46)
Lymphs Abs: 2 10*3/uL (ref 0.7–4.0)
Neutro Abs: 5.3 10*3/uL (ref 1.7–7.7)
Neutrophils Relative %: 66 % (ref 43–77)
Platelets: 238 10*3/uL (ref 150–400)
RBC: 3.46 MIL/uL — ABNORMAL LOW (ref 4.22–5.81)
WBC: 8 10*3/uL (ref 4.0–10.5)

## 2011-10-03 LAB — PROTIME-INR: Prothrombin Time: 15.8 seconds — ABNORMAL HIGH (ref 11.6–15.2)

## 2011-10-03 LAB — HEPATIC FUNCTION PANEL
Albumin: 2.9 g/dL — ABNORMAL LOW (ref 3.5–5.2)
Total Bilirubin: 5.3 mg/dL — ABNORMAL HIGH (ref 0.3–1.2)
Total Protein: 6.6 g/dL (ref 6.0–8.3)

## 2011-10-03 LAB — C-REACTIVE PROTEIN: CRP: 1.07 mg/dL — ABNORMAL HIGH (ref <0.60)

## 2011-10-19 ENCOUNTER — Telehealth (INDEPENDENT_AMBULATORY_CARE_PROVIDER_SITE_OTHER): Payer: Self-pay | Admitting: *Deleted

## 2011-10-19 DIAGNOSIS — K7010 Alcoholic hepatitis without ascites: Secondary | ICD-10-CM

## 2011-10-19 NOTE — Telephone Encounter (Signed)
Noted. When he calls please let him know that I have faxed his lab order to [facility]. Thank you

## 2011-10-19 NOTE — Telephone Encounter (Signed)
Gary Vasquez, Gary Vasquez apt has been scheduled for 10/30/11 at 10:30 am with Deberah Castle, NP. Called and he was not home. LM with his father to return the call.

## 2011-10-19 NOTE — Telephone Encounter (Signed)
Per Deberah Castle, NP , The patient will need C- Met in 1 month with a office visit. This was documented on patient 's flow sheet 09-28-11. It was also documented in patient 's office note that he was to have Hepatic profile. I checked with Terri and she stated to so  C-Met this is was faxed to [facility]. Forwarded to Butch Penny to make the patient an appointment.

## 2011-10-30 ENCOUNTER — Ambulatory Visit (INDEPENDENT_AMBULATORY_CARE_PROVIDER_SITE_OTHER): Payer: Self-pay | Admitting: Internal Medicine

## 2011-11-07 ENCOUNTER — Encounter (INDEPENDENT_AMBULATORY_CARE_PROVIDER_SITE_OTHER): Payer: Self-pay | Admitting: Internal Medicine

## 2011-11-07 ENCOUNTER — Ambulatory Visit (INDEPENDENT_AMBULATORY_CARE_PROVIDER_SITE_OTHER): Payer: Self-pay | Admitting: Internal Medicine

## 2011-11-07 DIAGNOSIS — R197 Diarrhea, unspecified: Secondary | ICD-10-CM

## 2011-11-07 DIAGNOSIS — K219 Gastro-esophageal reflux disease without esophagitis: Secondary | ICD-10-CM

## 2011-11-07 DIAGNOSIS — R748 Abnormal levels of other serum enzymes: Secondary | ICD-10-CM

## 2011-11-07 DIAGNOSIS — E46 Unspecified protein-calorie malnutrition: Secondary | ICD-10-CM | POA: Insufficient documentation

## 2011-11-07 LAB — COMPREHENSIVE METABOLIC PANEL
AST: 19 U/L (ref 0–37)
Albumin: 3.1 g/dL — ABNORMAL LOW (ref 3.5–5.2)
BUN: 12 mg/dL (ref 6–23)
Calcium: 8.9 mg/dL (ref 8.4–10.5)
Chloride: 101 mEq/L (ref 96–112)
Glucose, Bld: 90 mg/dL (ref 70–99)
Potassium: 4.2 mEq/L (ref 3.5–5.3)
Sodium: 136 mEq/L (ref 135–145)
Total Protein: 6.4 g/dL (ref 6.0–8.3)

## 2011-11-07 MED ORDER — RANITIDINE HCL 150 MG PO TABS: 150.0000 mg | ORAL_TABLET | Freq: Two times a day (BID) | ORAL | Status: DC

## 2011-11-07 NOTE — Progress Notes (Signed)
Presenting complaint;  Followup for alcoholic liver disease abdominal pain and diarrhea. Subjective:  Patient is 61 year old Caucasian male who is here for scheduled visit. He was last seen by Ms. Deberah Castle NP ON 10/02/2011. I saw him last in October 2012 body was in the hospital with decompensated liver disease secondary to a alcohol as well as alcoholic pancreatitis. His hepatitis B surface antigen and hepatitis C virus antibody were negative. He also has history of Crohn's disease. There was a question of flareup. He was treated and felt better and was discharged. He got sick again before he could return to the office for followup visit. He fell and sustained head injury with fracture and subdural hematoma and was taken to the [facility] where he was for 6 weeks. He was treated for what sounds like hepatitis and ascites. He was told by one of the physicians that he has chronic hepatitis C. On his last visit he was begun on furosemide and spiral lactone for LE edema and ascites. He had LFTs which are abnormal with bilirubin of over 5 mg. He was have repeat LFTs prior to this visit but has not had them so far. He has lost 7 pounds since his last visit. He has not had any alcohol since October 2012. He is getting his strength back. He is very concerned about the muscle wasting and weight loss that he is experienced over the last 6 months. His appetite is coming back but every time he eats he gets nausea abdominal pain and diarrhea. On most days he is having 4-5 stools. 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 He denies fever chills melena or rectal bleeding. He has had sporadic vomiting. He is taking Imodium 4 mg twice a day to control his diarrhea.  Current Medications: Current Outpatient Prescriptions  Medication Sig Dispense Refill  . alprazolam (XANAX) 2 MG tablet Take  0.5-1 mg by mouth 2 (two) times daily as needed. For anxiety       . cetirizine (ZYRTEC) 10 MG tablet Take 10 mg by mouth daily.        . furosemide (LASIX) 20 MG tablet Take 1 tablet (20 mg total) by mouth every other day.  30 tablet  3  . ibuprofen (ADVIL,MOTRIN) 200 MG tablet Take 200 mg by mouth every 6 (six) hours as needed. inflammation/pain       . Multiple Vitamin (MULTI VITAMIN MENS PO) Take by mouth daily.      Marland Kitchen oxyCODONE (OXYCONTIN) 10 MG 12 hr tablet Take 10 mg by mouth as needed.      . ranitidine (ZANTAC) 150 MG tablet Take 1 tablet (150 mg total) by mouth 2 (two) times daily.  60 tablet  5  . PARoxetine (PAXIL) 20 MG tablet Take 20 mg by mouth at bedtime.        Marland Kitchen spironolactone (ALDACTONE) 50 MG tablet Take 1 tablet (50 mg total) by mouth 2 (two) times daily.  30 tablet  11     Objective: Blood pressure 140/96, pulse 88, temperature 98.4 F (36.9 C), temperature source Oral, resp. rate 18, height 6' (1.829 m), weight 150 lb 3.2 oz (68.13 kg). Val developed thin Caucasian male who is in no acute distress. He does not have asterixis. Conjunctiva is pink. Sclera is nonicteric Oropharyngeal mucosa is normal. No neck masses or thyromegaly noted. Cardiac exam with regular  rhythm normal S1 and S2. No murmur or gallop noted. Lungs are clear to auscultation. Abdomen is symmetrical. Bowel sounds are hyperactive. On palpation is soft with vague tenderness across upper abdomen but no organomegaly or masses noted  He has trace edema around his ankles. No clubbing noted but he has ridges across his nails.  Labs/studies Results: liver function tests from 10/02/2011. Bilirubin 5.3 direct 2.5, AP 360, AST 52, ALT 30 and albumin 2.9.  Assessment:  Patient has multiple GI issues. #1. Alcoholic liver disease. Inflammatory compliment of his disease should improve now that he has not had any alcohol in over 5 months. He does not appear to be jaundiced today. #2. Fluid overload. He is  improving with diuretic therapy. He may not need chronic treatment if hepatic function recovers close to baseline.  #3. Postprandial abdominal pain with nausea and diarrhea. These symptoms of presumed to be due to dumping syndrome however it these symptoms may also be do to chronic pancreatitis. #4. Malnutrition and muscle wasting secondary to protracted illness and  liver disease.   Plan: Take Zantac 150 mg by mouth twice a day. Patient advised to keep Advil use to minimum. [id number] 2 capsules at each meal and one with snack. Samples given. He would go to the lab for CBC and comprehensive chemistry panel. Office visit in 2 months.

## 2011-11-07 NOTE — Patient Instructions (Signed)
Physician will contact you with results of blood work when completed. Take ibuprofen or Advil only when absolutely needed in order to minimize the risk. Zenpep (20,000 unit of lipase) 2 with each meal and one with snack. Notify if you have side effects. Take Zantac 150 mg by mouth before breakfast and evening.

## 2011-11-08 LAB — CBC
HCT: 35.8 % — ABNORMAL LOW (ref 39.0–52.0)
Hemoglobin: 12.1 g/dL — ABNORMAL LOW (ref 13.0–17.0)
RDW: 13.7 % (ref 11.5–15.5)
WBC: 7.9 10*3/uL (ref 4.0–10.5)

## 2011-12-26 ENCOUNTER — Encounter (INDEPENDENT_AMBULATORY_CARE_PROVIDER_SITE_OTHER): Payer: Self-pay

## 2012-01-08 ENCOUNTER — Ambulatory Visit (INDEPENDENT_AMBULATORY_CARE_PROVIDER_SITE_OTHER): Payer: Self-pay | Admitting: Internal Medicine

## 2012-01-08 ENCOUNTER — Encounter (INDEPENDENT_AMBULATORY_CARE_PROVIDER_SITE_OTHER): Payer: Self-pay

## 2012-07-01 ENCOUNTER — Emergency Department (HOSPITAL_COMMUNITY)
Admission: EM | Admit: 2012-07-01 | Discharge: 2012-07-01 | Disposition: A | Discharge status: Home / Self Care | Payer: Self-pay | Attending: Emergency Medicine | Admitting: Emergency Medicine

## 2012-07-01 ENCOUNTER — Encounter (HOSPITAL_COMMUNITY): Payer: Self-pay | Admitting: *Deleted

## 2012-07-01 ENCOUNTER — Other Ambulatory Visit: Payer: Self-pay

## 2012-07-01 DIAGNOSIS — K219 Gastro-esophageal reflux disease without esophagitis: Secondary | ICD-10-CM | POA: Insufficient documentation

## 2012-07-01 DIAGNOSIS — Z79899 Other long term (current) drug therapy: Secondary | ICD-10-CM | POA: Insufficient documentation

## 2012-07-01 DIAGNOSIS — I10 Essential (primary) hypertension: Secondary | ICD-10-CM | POA: Insufficient documentation

## 2012-07-01 DIAGNOSIS — R112 Nausea with vomiting, unspecified: Secondary | ICD-10-CM | POA: Insufficient documentation

## 2012-07-01 DIAGNOSIS — K5090 Crohn's disease, unspecified, without complications: Secondary | ICD-10-CM | POA: Insufficient documentation

## 2012-07-01 DIAGNOSIS — Z8739 Personal history of other diseases of the musculoskeletal system and connective tissue: Secondary | ICD-10-CM | POA: Insufficient documentation

## 2012-07-01 DIAGNOSIS — R109 Unspecified abdominal pain: Secondary | ICD-10-CM

## 2012-07-01 DIAGNOSIS — F17200 Nicotine dependence, unspecified, uncomplicated: Secondary | ICD-10-CM | POA: Insufficient documentation

## 2012-07-01 DIAGNOSIS — R1013 Epigastric pain: Secondary | ICD-10-CM | POA: Insufficient documentation

## 2012-07-01 DIAGNOSIS — K739 Chronic hepatitis, unspecified: Secondary | ICD-10-CM | POA: Insufficient documentation

## 2012-07-01 HISTORY — DX: Unspecified osteoarthritis, unspecified site: M19.90

## 2012-07-01 LAB — COMPREHENSIVE METABOLIC PANEL
ALT: 22 U/L (ref 0–53)
AST: 23 U/L (ref 0–37)
Albumin: 3.2 g/dL — ABNORMAL LOW (ref 3.5–5.2)
Alkaline Phosphatase: 93 U/L (ref 39–117)
Chloride: 100 mEq/L (ref 96–112)
Potassium: 3.5 mEq/L (ref 3.5–5.1)
Sodium: 135 mEq/L (ref 135–145)
Total Bilirubin: 0.5 mg/dL (ref 0.3–1.2)
Total Protein: 6.7 g/dL (ref 6.0–8.3)

## 2012-07-01 LAB — CBC WITH DIFFERENTIAL/PLATELET
Basophils Absolute: 0 10*3/uL (ref 0.0–0.1)
Basophils Relative: 1 % (ref 0–1)
Eosinophils Absolute: 0.1 10*3/uL (ref 0.0–0.7)
Hemoglobin: 14.8 g/dL (ref 13.0–17.0)
MCH: 31.6 pg (ref 26.0–34.0)
MCHC: 34.2 g/dL (ref 30.0–36.0)
Monocytes Relative: 10 % (ref 3–12)
Neutro Abs: 5.1 10*3/uL (ref 1.7–7.7)
Neutrophils Relative %: 69 % (ref 43–77)
Platelets: 129 10*3/uL — ABNORMAL LOW (ref 150–400)
RDW: 14.4 % (ref 11.5–15.5)

## 2012-07-01 MED ORDER — ONDANSETRON HCL 4 MG/2ML IJ SOLN
4.0000 mg | Freq: Once | INTRAMUSCULAR | Status: AC
  Administered 2012-07-01: 4 mg via INTRAVENOUS
  Filled 2012-07-01: qty 2

## 2012-07-01 MED ORDER — PANTOPRAZOLE SODIUM 40 MG IV SOLR
40.0000 mg | Freq: Once | INTRAVENOUS | Status: AC
  Administered 2012-07-01: 40 mg via INTRAVENOUS
  Filled 2012-07-01: qty 40

## 2012-07-01 MED ORDER — SODIUM CHLORIDE 0.9 % IV SOLN
Freq: Once | INTRAVENOUS | Status: AC
  Administered 2012-07-01: 02:00:00 via INTRAVENOUS

## 2012-07-01 MED ORDER — HYDROMORPHONE HCL PF 1 MG/ML IJ SOLN
1.0000 mg | Freq: Once | INTRAMUSCULAR | Status: AC
  Administered 2012-07-01: 1 mg via INTRAVENOUS
  Filled 2012-07-01: qty 1

## 2012-07-01 MED ORDER — ONDANSETRON HCL 4 MG PO TABS: 4.0000 mg | ORAL_TABLET | Freq: Four times a day (QID) | ORAL | Status: DC

## 2012-07-01 NOTE — ED Provider Notes (Signed)
History     CSN: [id number]  Arrival date & time 07/01/12  0113   First MD Initiated Contact with Patient 07/01/12 0156      Chief Complaint  Patient presents with  . Chest Pain  . Abdominal Pain    (Consider location/radiation/quality/duration/timing/severity/associated sxs/prior treatment) HPI Gary Vasquez is a 61 y.o. male with a h/o hiatal hernia, reflux, pancreatitis, Crohn disease, brought in by ambulance, who presents to the Emergency Department complaining of abdominal pain that began around 1800 today. He ate barbeque ribs for dinner at 1730 and lay down on the couch to watch football. Pain began in the epigastric area and became progressively worse. It radiates up into his chest.Has been continuous associated with nausea and one episode of vomiting. Denies fever, chills, diarrhea. He has taken no medicines.   PCP Dr. Milford Cage GI Dr. Karilyn Cota   Past Medical History  Diagnosis Date  . Crohn disease   . Acid reflux   . Hypertension   . Alcoholic hepatitis   . Arthritis     Past Surgical History  Procedure Date  . Nissen fundoplication   . Abdominal surgery     Family History  Problem Relation Age of Onset  . Healthy Daughter   . Healthy Son     History  Substance Use Topics  . Smoking status: Current Some Day Smoker -- 0.5 packs/day    Types: Cigarettes  . Smokeless tobacco: Never Used     Comment: 2-3 cigarrettes a week  . Alcohol Use: Yes     Comment: occasionally the last one this afternoon      Review of Systems  Constitutional: Negative for fever.       10 Systems reviewed and are negative for acute change except as noted in the HPI.  HENT: Negative for congestion.   Eyes: Negative for discharge and redness.  Respiratory: Negative for cough and shortness of breath.   Cardiovascular: Negative for chest pain.  Gastrointestinal: Positive for nausea, vomiting and abdominal pain.  Musculoskeletal: Negative for back pain.  Skin: Negative for rash.    Neurological: Negative for syncope, numbness and headaches.  Psychiatric/Behavioral:       No behavior change.    Allergies  Review of patient's allergies indicates no known allergies.  Home Medications   Current Outpatient Rx  Name  Route  Sig  Dispense  Refill  . ALPRAZOLAM 2 MG PO TABS   Oral   Take 0.5-1 mg by mouth 2 (two) times daily as needed. For anxiety          . CETIRIZINE HCL 10 MG PO TABS   Oral   Take 10 mg by mouth daily.           . FUROSEMIDE 20 MG PO TABS   Oral   Take 1 tablet (20 mg total) by mouth every other day.   30 tablet   3   . IBUPROFEN 200 MG PO TABS   Oral   Take 200 mg by mouth every 6 (six) hours as needed. inflammation/pain          . MULTI VITAMIN MENS PO   Oral   Take by mouth daily.         . OXYCODONE HCL ER 10 MG PO TB12   Oral   Take 10 mg by mouth as needed.         Marland Kitchen PAROXETINE HCL 20 MG PO TABS   Oral   Take 20 mg by  mouth at bedtime.           Marland Kitchen RANITIDINE HCL 150 MG PO TABS   Oral   Take 1 tablet (150 mg total) by mouth 2 (two) times daily.   60 tablet   5   . SPIRONOLACTONE 50 MG PO TABS   Oral   Take 1 tablet (50 mg total) by mouth 2 (two) times daily.   30 tablet   11     BP 158/98  Pulse 88  Temp 97.7 F (36.5 C) (Oral)  Resp 20  Ht 6' (1.829 m)  Wt 160 lb (72.576 kg)  BMI 21.70 kg/m2  SpO2 94%  Physical Exam  Nursing note and vitals reviewed. Constitutional: He is oriented to person, place, and time. He appears well-developed and well-nourished.       Awake, alert, nontoxic appearance.  HENT:  Head: Normocephalic and atraumatic.  Eyes: EOM are normal. Pupils are equal, round, and reactive to light. Right eye exhibits no discharge. Left eye exhibits no discharge.  Neck: Normal range of motion. Neck supple.  Cardiovascular: Normal heart sounds.   Pulmonary/Chest: Effort normal and breath sounds normal. He exhibits no tenderness.  Abdominal: Soft. Bowel sounds are normal. There is  tenderness. There is guarding. There is no rebound.       Marked tenderness to the epigastric areas with palpation. Guarding.  Musculoskeletal: He exhibits no tenderness.       Baseline ROM, no obvious new focal weakness.  Neurological: He is alert and oriented to person, place, and time.       Mental status and motor strength appears baseline for patient and situation.  Skin: No rash noted.  Psychiatric: He has a normal mood and affect.    ED Course  Procedures (including critical care time) Results for orders placed during the hospital encounter of 07/01/12  CBC WITH DIFFERENTIAL      Component Value Range   WBC 7.3  4.0 - 10.5 K/uL   RBC 4.69  4.22 - 5.81 MIL/uL   Hemoglobin 14.8  13.0 - 17.0 g/dL   HCT 78.2  95.6 - 21.3 %   MCV 92.3  78.0 - 100.0 fL   MCH 31.6  26.0 - 34.0 pg   MCHC 34.2  30.0 - 36.0 g/dL   RDW 08.6  57.8 - 46.9 %   Platelets 129 (*) 150 - 400 K/uL   Neutrophils Relative 69  43 - 77 %   Neutro Abs 5.1  1.7 - 7.7 K/uL   Lymphocytes Relative 19  12 - 46 %   Lymphs Abs 1.4  0.7 - 4.0 K/uL   Monocytes Relative 10  3 - 12 %   Monocytes Absolute 0.7  0.1 - 1.0 K/uL   Eosinophils Relative 1  0 - 5 %   Eosinophils Absolute 0.1  0.0 - 0.7 K/uL   Basophils Relative 1  0 - 1 %   Basophils Absolute 0.0  0.0 - 0.1 K/uL  COMPREHENSIVE METABOLIC PANEL      Component Value Range   Sodium 135  135 - 145 mEq/L   Potassium 3.5  3.5 - 5.1 mEq/L   Chloride 100  96 - 112 mEq/L   CO2 23  19 - 32 mEq/L   Glucose, Bld 131 (*) 70 - 99 mg/dL   BUN 12  6 - 23 mg/dL   Creatinine, Ser 6.29  0.50 - 1.35 mg/dL   Calcium 8.4  8.4 - 52.8 mg/dL   Total  Protein 6.7  6.0 - 8.3 g/dL   Albumin 3.2 (*) 3.5 - 5.2 g/dL   AST 23  0 - 37 U/L   ALT 22  0 - 53 U/L   Alkaline Phosphatase 93  39 - 117 U/L   Total Bilirubin 0.5  0.3 - 1.2 mg/dL   GFR calc non Af Amer 64 (*) >90 mL/min   GFR calc Af Amer 74 (*) >90 mL/min  LIPASE, BLOOD      Component Value Range   Lipase 24  11 - 59 U/L      Date: 07/01/2012   0115  Rate: 83  Rhythm: normal sinus rhythm  QRS Axis: normal  Intervals: normal  ST/T Wave abnormalities: normal  Conduction Disutrbances: none  Narrative Interpretation: unremarkable  0300 Pain has improved. Nausea has resolved.  0400 Pain controlled. He is requesting a dose of medicine as he is discharged. Reviewed results  with patient. Answered questions. He verbalized understanding.   MDM  Patient with sudden onset abdominal pain after eating last night. H/o pancreatitis, GERD, Crohns. Labs are unremarkable. Lipase normal. Given analgesic, PPI, antiemetic with improvement.  Pt feels improved after observation and/or treatment in ED.Pt stable in ED with no significant deterioration in condition.The patient appears reasonably screened and/or stabilized for discharge and I doubt any other medical condition or other [facility] requiring further screening, evaluation, or treatment in the ED at this time prior to discharge.  MDM Reviewed: nursing note and vitals Interpretation: labs           Nicoletta Dress. Colon Branch, MD 07/01/12 [phone]

## 2012-07-01 NOTE — ED Notes (Signed)
Pt discharged. Pt stable at time of discharge. Medications reviewed pt has no questions regarding discharge at this time. Pt voiced understanding of discharge instructions.  

## 2012-07-01 NOTE — ED Notes (Signed)
Pt arrived from home via ems d/t cp and abd pain. Pt states he began having pain about 5 hours ago after having ribs for dinner. Pt describes cp as pressure that increases with palpation and deep breath. Pt states abd pain is worse than the cp. abd pain is all over and sharp that increases with palpation. Pt denies sob at this time.

## 2013-02-25 DIAGNOSIS — K760 Fatty (change of) liver, not elsewhere classified: Secondary | ICD-10-CM

## 2013-02-25 DIAGNOSIS — M5030 Other cervical disc degeneration, unspecified cervical region: Secondary | ICD-10-CM

## 2013-02-25 DIAGNOSIS — G9389 Other specified disorders of brain: Secondary | ICD-10-CM

## 2013-02-25 HISTORY — DX: Other specified disorders of brain: G93.89

## 2013-02-25 HISTORY — DX: Fatty (change of) liver, not elsewhere classified: K76.0

## 2013-02-25 HISTORY — DX: Other cervical disc degeneration, unspecified cervical region: M50.30

## 2013-03-13 ENCOUNTER — Ambulatory Visit (INDEPENDENT_AMBULATORY_CARE_PROVIDER_SITE_OTHER): Payer: Medicaid Other | Admitting: Internal Medicine

## 2013-03-13 ENCOUNTER — Encounter (INDEPENDENT_AMBULATORY_CARE_PROVIDER_SITE_OTHER): Payer: Self-pay | Admitting: Internal Medicine

## 2013-03-13 VITALS — BP 127/72 | HR 76 | Temp 98.3°F | Ht 72.0 in | Wt 156.4 lb

## 2013-03-13 DIAGNOSIS — K5010 Crohn's disease of large intestine without complications: Secondary | ICD-10-CM

## 2013-03-13 DIAGNOSIS — K709 Alcoholic liver disease, unspecified: Secondary | ICD-10-CM | POA: Insufficient documentation

## 2013-03-13 LAB — CBC WITH DIFFERENTIAL/PLATELET
Basophils Absolute: 0 10*3/uL (ref 0.0–0.1)
Basophils Relative: 1 % (ref 0–1)
Eosinophils Relative: 1 % (ref 0–5)
HCT: 40.4 % (ref 39.0–52.0)
Hemoglobin: 14.4 g/dL (ref 13.0–17.0)
MCHC: 35.6 g/dL (ref 30.0–36.0)
MCV: 88 fL (ref 78.0–100.0)
Monocytes Absolute: 0.5 10*3/uL (ref 0.1–1.0)
Monocytes Relative: 7 % (ref 3–12)
Neutro Abs: 2.8 10*3/uL (ref 1.7–7.7)
RDW: 15.5 % (ref 11.5–15.5)

## 2013-03-13 MED ORDER — ALPRAZOLAM ER 0.5 MG PO TB24: 0.5000 mg | ORAL_TABLET | ORAL | Status: DC

## 2013-03-13 NOTE — Progress Notes (Signed)
Subjective:     Patient ID: Gary Vasquez, male   DOB: 08-Oct-1950, 62 y.o.   MRN: [id number]  HPI He tells me he is here today because of weight loss. He has nausea in the morning. He tells me he gained up to 180. He started losing weight about 3-4 months ago. His last documented weight was 150 in April of 2013. Today his weight is 156.4 lb. He has a hx of dumping surgery. Hx of Crohn disease at age 77 and had a colon resection.  After eating he has mid abdominal pain.  Appetite is okay. He says he knows he is going to get sick everytime he eats. He usually has a BM 3-4 times a day. Sometimes he will have diarrhea and sometimes not. There has been no rectal bleeding. He is drinking 3-4 drinks a day His primary care has moved out of town (Gary Vasquez). He is planning on seeing Gary Vasquez in [place].  He has a hx of alcoholic hepatitis and Crohn's disease. He was seen at [facility] in December and d/c in January of last year. He was admitted for liver failure, pancreatitis, alcoholic hepatitis and a closed head injury from a fall. He was in the hospital for approximately 6 weeks. He tells me when the injury occurred because he  had been drinking.     CBC    Component Value Date/Time   WBC 7.3 07/01/2012 0231   RBC 4.69 07/01/2012 0231   HGB 14.8 07/01/2012 0231   HCT 43.3 07/01/2012 0231   PLT 129* 07/01/2012 0231   MCV 92.3 07/01/2012 0231   MCH 31.6 07/01/2012 0231   MCHC 34.2 07/01/2012 0231   RDW 14.4 07/01/2012 0231   LYMPHSABS 1.4 07/01/2012 0231   MONOABS 0.7 07/01/2012 0231   EOSABS 0.1 07/01/2012 0231   BASOSABS 0.0 07/01/2012 0231   CMP     Component Value Date/Time   NA 135 07/01/2012 0231   K 3.5 07/01/2012 0231   CL 100 07/01/2012 0231   CO2 23 07/01/2012 0231   GLUCOSE 131* 07/01/2012 0231   BUN 12 07/01/2012 0231   CREATININE 1.22 07/01/2012 0231   CREATININE 1.06 11/07/2011 1243   CALCIUM 8.4 07/01/2012 0231   PROT 6.7 07/01/2012 0231   ALBUMIN 3.2* 07/01/2012 0231   AST 23 07/01/2012  0231   ALT 22 07/01/2012 0231   ALKPHOS 93 07/01/2012 0231   BILITOT 0.5 07/01/2012 0231   GFRNONAA 64* 07/01/2012 0231   GFRAA 74* 07/01/2012 0231       Colonoscopy in 2006 Gary Vasquez:  6. Single erosion at the ileal colonic anastomosis. Biopsy taken from this  area. Neoterminal ileum for at least 20 cm were normal.  7. As far as his Crohn's disease is concerned, he appears to be in  remission.  Hx of severe GERD with aspirating pneumona.  At age 58 he had a colon resections for Crohn's disease.  He also has had surgery for reflux disease.  06/26/11 CT abd/pelvis with CMIMPRESSION:  Diffuse fatty infiltration of the liver. Inflammatory stranding  throughout the upper abdominal fat consistent with inflammatory  process such as pancreatitis or gastritis/duodenitis.  Postoperative changes of right colon resection and ileocolonic  anastomoses. Thickening of the wall of the terminal ileum which  could represent residual Crohn's stricture. Active inflammation    Hepatitis B surface Antigen negative. Hepatitis C antibody negative.       11/2007 Gary Vasquez.  . Gastric outlet obstruction secondary to  leak and stricture from  pyloroplasty.   . 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   . Severe gastroparesis/delayed gastric emptying status post  pyloroplasty complicated by leak and stricture.      Review of Systems see hpi     Objective:   Physical Exam  Filed Vitals:   03/13/13 1433  BP: 127/72  Pulse: 76  Temp: 98.3 F (36.8 C)  Height: 6' (1.829 m)  Weight: 156 lb 6.4 oz (70.943 kg)      Alert and oriented. Skin warm and dry. Oral mucosa is moist.   . Sclera anicteric, conjunctivae is pink. Thyroid not enlarged. No cervical lymphadenopathy. Lungs clear. Heart regular rate and rhythm.  Abdomen is soft. Bowel sounds are positive. No hepatomegaly. No abdominal masses felt.  Tenderness across mid abdomen.  No edema to lower extremities.   Assessment:      Alcoholic liver disease. Patient continues to drink 4-5 drinks a day.  Hx of Crohn's disease. He seems to be in remission at this time.    Plan:    US abdomen, AFP, CMET, PT/INR, CRP , CBC US abdomen   Hep C antibody. OV in 3 months. Patient again advised he must stop drinking. I am going to give him Xanax 0.5mg  #14 with no refills. He requested a refill of Xanax 2mg . If he needs any further Xanax he will get them from his primary care. Please note patient called me back to where he was being scheduled for an [place]. He questioned his Xanax 0.5mg . I told him I discussed this with Gary Vasquez and we agreed to give him Xanax 0.5mg . He stated he might as well not get it filled.

## 2013-03-13 NOTE — Patient Instructions (Addendum)
Labs today. OV in 3 month . Further recommendations to follow

## 2013-03-14 LAB — COMPREHENSIVE METABOLIC PANEL
ALT: 120 U/L — ABNORMAL HIGH (ref 0–53)
AST: 108 U/L — ABNORMAL HIGH (ref 0–37)
Albumin: 4.8 g/dL (ref 3.5–5.2)
BUN: 17 mg/dL (ref 6–23)
CO2: 22 mEq/L (ref 19–32)
Calcium: 9.4 mg/dL (ref 8.4–10.5)
Chloride: 99 mEq/L (ref 96–112)
Creat: 1.37 mg/dL — ABNORMAL HIGH (ref 0.50–1.35)
Potassium: 4 mEq/L (ref 3.5–5.3)

## 2013-03-14 LAB — PROTIME-INR: INR: 0.95 (ref <1.50)

## 2013-03-14 LAB — C-REACTIVE PROTEIN: CRP: <0.5 mg/dL (ref <0.60)

## 2013-03-14 LAB — AFP TUMOR MARKER: AFP-Tumor Marker: 3.5 ng/mL (ref 0.0–8.0)

## 2013-03-17 ENCOUNTER — Telehealth (INDEPENDENT_AMBULATORY_CARE_PROVIDER_SITE_OTHER): Payer: Self-pay | Admitting: Internal Medicine

## 2013-03-17 NOTE — Telephone Encounter (Signed)
I talked with Gary Vasquez at [facility]. Will change the xanax to 0.5mg , and not XR.  # 14.

## 2013-03-18 ENCOUNTER — Ambulatory Visit (HOSPITAL_COMMUNITY)
Admission: RE | Admit: 2013-03-18 | Discharge: 2013-03-18 | Disposition: A | Discharge status: Home / Self Care | Payer: Medicaid Other | Source: Ambulatory Visit | Attending: Internal Medicine | Admitting: Internal Medicine

## 2013-03-18 DIAGNOSIS — K5010 Crohn's disease of large intestine without complications: Secondary | ICD-10-CM

## 2013-03-18 DIAGNOSIS — K709 Alcoholic liver disease, unspecified: Secondary | ICD-10-CM

## 2013-03-19 ENCOUNTER — Emergency Department (HOSPITAL_COMMUNITY): Payer: Medicaid Other

## 2013-03-19 ENCOUNTER — Encounter (HOSPITAL_COMMUNITY): Payer: Self-pay | Admitting: Emergency Medicine

## 2013-03-19 ENCOUNTER — Observation Stay (HOSPITAL_COMMUNITY)
Admission: EM | Admit: 2013-03-19 | Discharge: 2013-03-21 | Discharge status: Against Medical Advice | Payer: Medicaid Other | Attending: Family Medicine | Admitting: Family Medicine

## 2013-03-19 DIAGNOSIS — F1010 Alcohol abuse, uncomplicated: Secondary | ICD-10-CM

## 2013-03-19 DIAGNOSIS — G8190 Hemiplegia, unspecified affecting unspecified side: Secondary | ICD-10-CM

## 2013-03-19 DIAGNOSIS — K7010 Alcoholic hepatitis without ascites: Secondary | ICD-10-CM | POA: Insufficient documentation

## 2013-03-19 DIAGNOSIS — M216X9 Other acquired deformities of unspecified foot: Secondary | ICD-10-CM | POA: Insufficient documentation

## 2013-03-19 DIAGNOSIS — M79609 Pain in unspecified limb: Secondary | ICD-10-CM

## 2013-03-19 DIAGNOSIS — G5790 Unspecified mononeuropathy of unspecified lower limb: Principal | ICD-10-CM | POA: Insufficient documentation

## 2013-03-19 DIAGNOSIS — M79605 Pain in left leg: Secondary | ICD-10-CM

## 2013-03-19 DIAGNOSIS — F10220 Alcohol dependence with intoxication, uncomplicated: Secondary | ICD-10-CM

## 2013-03-19 DIAGNOSIS — G8194 Hemiplegia, unspecified affecting left nondominant side: Secondary | ICD-10-CM

## 2013-03-19 DIAGNOSIS — Z8719 Personal history of other diseases of the digestive system: Secondary | ICD-10-CM

## 2013-03-19 DIAGNOSIS — F1020 Alcohol dependence, uncomplicated: Secondary | ICD-10-CM | POA: Insufficient documentation

## 2013-03-19 DIAGNOSIS — R7989 Other specified abnormal findings of blood chemistry: Secondary | ICD-10-CM

## 2013-03-19 DIAGNOSIS — I639 Cerebral infarction, unspecified: Secondary | ICD-10-CM

## 2013-03-19 HISTORY — DX: Gastroparesis: K31.84

## 2013-03-19 HISTORY — DX: Other chronic pain: G89.29

## 2013-03-19 HISTORY — DX: Dorsalgia, unspecified: M54.9

## 2013-03-19 HISTORY — DX: Traumatic subdural hemorrhage with loss of consciousness status unknown, initial encounter: S06.5XAA

## 2013-03-19 HISTORY — DX: Traumatic subdural hemorrhage with loss of consciousness of unspecified duration, initial encounter: S06.5X9A

## 2013-03-19 HISTORY — DX: Transient cerebral ischemic attack, unspecified: G45.9

## 2013-03-19 HISTORY — DX: Migraine, unspecified, not intractable, without status migrainosus: G43.909

## 2013-03-19 HISTORY — DX: Noninfective gastroenteritis and colitis, unspecified: K52.9

## 2013-03-19 HISTORY — DX: Diaphragmatic hernia without obstruction or gangrene: K44.9

## 2013-03-19 LAB — URINALYSIS W MICROSCOPIC + REFLEX CULTURE
Bilirubin Urine: NEGATIVE
Ketones, ur: NEGATIVE mg/dL
Nitrite: NEGATIVE
Urobilinogen, UA: 0.2 mg/dL (ref 0.0–1.0)

## 2013-03-19 LAB — CBC WITH DIFFERENTIAL/PLATELET
Basophils Absolute: 0 10*3/uL (ref 0.0–0.1)
Basophils Relative: 1 % (ref 0–1)
Eosinophils Absolute: 0.1 10*3/uL (ref 0.0–0.7)
Eosinophils Relative: 1 % (ref 0–5)
MCH: 32.1 pg (ref 26.0–34.0)
MCHC: 35.1 g/dL (ref 30.0–36.0)
MCV: 91.3 fL (ref 78.0–100.0)
Platelets: 122 10*3/uL — ABNORMAL LOW (ref 150–400)
RDW: 14.4 % (ref 11.5–15.5)

## 2013-03-19 LAB — RAPID URINE DRUG SCREEN, HOSP PERFORMED
Cocaine: NOT DETECTED
Opiates: NOT DETECTED
Tetrahydrocannabinol: POSITIVE — AB

## 2013-03-19 LAB — COMPREHENSIVE METABOLIC PANEL
AST: 114 U/L — ABNORMAL HIGH (ref 0–37)
Albumin: 4.1 g/dL (ref 3.5–5.2)
CO2: 24 mEq/L (ref 19–32)
Calcium: 10 mg/dL (ref 8.4–10.5)
Creatinine, Ser: 0.97 mg/dL (ref 0.50–1.35)
GFR calc non Af Amer: 89 mL/min — ABNORMAL LOW (ref >90)

## 2013-03-19 LAB — TROPONIN I: Troponin I: <0.3 ng/mL (ref <0.30)

## 2013-03-19 MED ORDER — LORAZEPAM 1 MG PO TABS: 0.0000 mg | ORAL_TABLET | Freq: Two times a day (BID) | ORAL | Status: DC

## 2013-03-19 MED ORDER — ADULT MULTIVITAMIN W/MINERALS CH: 1.0000 | ORAL_TABLET | Freq: Every day | ORAL | Status: DC

## 2013-03-19 MED ORDER — VITAMIN B-1 100 MG PO TABS: 100.0000 mg | ORAL_TABLET | Freq: Every day | ORAL | Status: DC

## 2013-03-19 MED ORDER — LORAZEPAM 2 MG/ML IJ SOLN: 1.0000 mg | Freq: Four times a day (QID) | INTRAMUSCULAR | Status: DC | PRN

## 2013-03-19 MED ORDER — LORAZEPAM 1 MG PO TABS: 0.0000 mg | ORAL_TABLET | Freq: Four times a day (QID) | ORAL | Status: DC

## 2013-03-19 MED ORDER — SODIUM CHLORIDE 0.9 % IV SOLN
INTRAVENOUS | Status: DC
  Administered 2013-03-19: 20:00:00 via INTRAVENOUS

## 2013-03-19 MED ORDER — FOLIC ACID 1 MG PO TABS: 1.0000 mg | ORAL_TABLET | Freq: Every day | ORAL | Status: DC

## 2013-03-19 MED ORDER — OXYCODONE-ACETAMINOPHEN 5-325 MG PO TABS: 1.0000 | ORAL_TABLET | Freq: Once | ORAL | Status: DC

## 2013-03-19 MED ORDER — LORAZEPAM 1 MG PO TABS
1.0000 mg | ORAL_TABLET | Freq: Four times a day (QID) | ORAL | Status: DC | PRN
  Administered 2013-03-19: 1 mg via ORAL
  Filled 2013-03-19 (×2): qty 1

## 2013-03-19 MED ORDER — THIAMINE HCL 100 MG/ML IJ SOLN: 100.0000 mg | Freq: Every day | INTRAMUSCULAR | Status: DC

## 2013-03-19 MED ORDER — FENTANYL CITRATE 0.05 MG/ML IJ SOLN
50.0000 ug | INTRAMUSCULAR | Status: AC | PRN
  Administered 2013-03-19 (×2): 50 ug via INTRAVENOUS
  Filled 2013-03-19 (×2): qty 2

## 2013-03-19 NOTE — ED Notes (Signed)
Given a heated healthy choice frozen dinner - eating

## 2013-03-19 NOTE — ED Notes (Signed)
Advised he will be admitted however no bed available at present.  Medication given, patient cooperative, states his leg is still painful. Advised if not more improvement in a short time to call me.  Also has abdominal pain now after eating dinner.

## 2013-03-19 NOTE — ED Notes (Signed)
Patient asking for something else for pain. Advised the hospitalist will be admitting and entering orders.

## 2013-03-19 NOTE — H&P (Signed)
[facility] History and Physical  [name]  BJY:[id number]  DOB: 12/07/1950   DOA: 03/19/2013   PCP:   Jeoffrey Massed, MD   Chief Complaint:  Progressive left leg pain x4 weeks; Unable to walk today  HPI: Gary Vasquez is a 62 y.o. male.  Middle-aged Caucasian gentleman with a history of alcohol abuse, presents to the emergency room in an intoxicated state complaining of difficulty walking because of left leg pain, and left-sided weakness which has been getting progressively worse for the past one to 2 months  Because of his intoxication the emergency room physician felt his history was unreliable, that there was a possibility that he had an acute stroke, his physical exam showed left-sided weakness with left facial droop and she felt he needed admission  4 MRI to rule out acute stroke.  While waiting for the admission patient's ambulation has improved significantly with intravenous pain medication, and he has made several trips to the restroom unassisted.   He has had no speech or swallowing difficulties, and has already eaten a meal in the emergency room without difficulty. He continues to complain of pain in the left leg.  He reports he is previously attended AA meetings for his alcoholism, is now drinking only 4-5 whiskeys per day, but does develop the shakes if he goes a few days without drinking.  Rewiew of Systems:   All systems negative except as marked bold or noted in the HPI;  Constitutional:    malaise, fever and chills. ;  Eyes:   eye pain, redness and discharge. ;  ENMT:   ear pain, hoarseness, nasal congestion, sinus pressure and sore throat. ;  Cardiovascular:    chest pain, palpitations, diaphoresis, dyspnea and peripheral edema.  Respiratory:   cough, hemoptysis, wheezing and stridor. ;  Gastrointestinal:   Episodic nausea, vomiting, diarrhea, constipation, abdominal pain, melena, blood in stool, hematemesis, jaundice and rectal bleeding. unusual  weight loss..   Genitourinary:    frequency, dysuria, incontinence,flank pain and hematuria; Musculoskeletal:   back pain and neck pain.  swelling and trauma.;  Skin: .  pruritus, rash, abrasions, easy  bruising and skin lesion.; ulcerations Neuro:    headache, lightheadedness and neck stiffness.  weakness, altered level of consciousness, altered mental status,   involuntary movement, seizure and syncope.  Psych:    anxiety, depression, insomnia, tearfulness, panic attacks, hallucinations, paranoia, suicidal or homicidal ideation    Past Medical History  Diagnosis Date  . Crohn disease   . Acid reflux   . Hypertension   . Alcoholic hepatitis   . Arthritis   . Dumping syndrome   . Paraesophageal hernia   . Chronic diarrhea   . Subdural hematoma   . Gastroparesis   . Migraine headache   . Chronic back pain   . TIA (transient ischemic attack)     Past Surgical History  Procedure Laterality Date  . Nissen fundoplication    . Colon resection Right   . Pyloroplasty  2009    Medications:  HOME MEDS: Prior to Admission medications   Medication Sig Start Date End Date Taking? Authorizing Provider  ALPRAZolam (XANAX XR) 0.5 MG 24 hr tablet Take 1 tablet (0.5 mg total) by mouth every morning. 03/13/13  Yes Len Blalock, NP  ibuprofen (ADVIL,MOTRIN) 200 MG tablet Take 200 mg by mouth every 6 (six) hours as needed. inflammation/pain    Yes Historical Provider, MD  Multiple Vitamin (MULTIVITAMIN WITH MINERALS) TABS Take 1 tablet by mouth  daily.   Yes Historical Provider, MD  ranitidine (ZANTAC) 150 MG tablet Take 1 tablet (150 mg total) by mouth 2 (two) times daily. 11/07/11  Yes Malissa Hippo, MD  cetirizine (ZYRTEC) 10 MG tablet Take 10 mg by mouth daily.      Historical Provider, MD     Allergies:  No Known Allergies  Social History:   reports that he has been smoking Cigarettes.  He has been smoking about 0.50 packs per day. He has never used smokeless tobacco. He reports  that  drinks alcohol. He reports that he does not use illicit drugs. 4-5/day Marijuana occ " 4-5/ day" whiskey; becoems shkey when he doesn't drink for a few dasy.  Family History: Family History  Problem Relation Age of Onset  . Healthy Daughter   . Healthy Son      Physical Exam: Filed Vitals:   03/19/13 1850 03/19/13 2146  BP: 135/69 150/83  Pulse: 85 67  Temp: 98.1 F (36.7 C) 97.8 F (36.6 C)  TempSrc: Oral Oral  Resp: 16 20  Height: 6' (1.829 m)   Weight: 72.576 kg (160 lb)   SpO2: 98% 97%   Blood pressure 150/83, pulse 67, temperature 97.8 F (36.6 C), temperature source Oral, resp. rate 20, height 6' (1.829 m), weight 72.576 kg (160 lb), SpO2 97.00%. Body mass index is 21.7 kg/(m^2).   GEN:  Pleasant middle-aged Caucasian gentleman lying in the stretcher in no acute distress; cooperative with exam PSYCH:  alert and oriented x4;  neither anxious nor depressed; affect is appropriate. HEENT: Mucous membranes pink and anicteric; PERRLA; EOM intact; no cervical lymphadenopathy nor thyromegaly or carotid bruit; no JVD; Breasts:: Not examined CHEST WALL: No tenderness CHEST: Normal respiration, clear to auscultation bilaterally HEART: Regular rate and rhythm; no murmurs rubs or gallops BACK: No kyphosis no scoliosis; no CVA tenderness ABDOMEN: soft non-tender; no masses, no organomegaly, normal abdominal bowel sounds; no pannus; no intertriginous candida. Rectal Exam: Not done EXTREMITIES: age-appropriate arthropathy of the hands and knees; no edema; no ulcerations. Genitalia: not examined PULSES: 2+ and symmetric SKIN: Normal hydration no rash or ulceration CNS: Cranial nerves 2-12 grossly intact; left grip strength slightly weaker than the right; left leg marginally weaker than the left with strong reinforcement, but reluctant to move the leg because of pain; increased tone in the left leg; decreased tendon reflex at the right knee; few jerks of clonus at the left  ankle    Labs on Admission:  Basic Metabolic Panel:  Recent Labs Lab 03/13/13 1525 03/19/13 2005  NA 135 137  K 4.0 3.6  CL 99 98  CO2 22 24  GLUCOSE 96 109*  BUN 17 11  CREATININE 1.37* 0.97  CALCIUM 9.4 10.0   Liver Function Tests:  Recent Labs Lab 03/13/13 1525 03/19/13 2005  AST 108* 114*  ALT 120* 136*  ALKPHOS 81 88  BILITOT 0.8 0.8  PROT 7.3 7.8  ALBUMIN 4.8 4.1   No results found for this basename: LIPASE, AMYLASE,  in the last 168 hours  Recent Labs Lab 03/19/13 2006  AMMONIA 32   CBC:  Recent Labs Lab 03/13/13 1525 03/19/13 2000  WBC 6.5 4.7  NEUTROABS 2.8 2.0  HGB 14.4 14.3  HCT 40.4 40.7  MCV 88.0 91.3  PLT 140* 122*   Cardiac Enzymes:  Recent Labs Lab 03/19/13 2000  TROPONINI <0.30   BNP: No components found with this basename: POCBNP,  D-dimer: No components found with this basename: D-DIMER,  CBG: No results found for this basename: GLUCAP,  in the last 168 hours  Radiological Exams on Admission: Dg Chest 2 View  03/19/2013   *RADIOLOGY REPORT*  Clinical Data: Nausea and vomiting.  CHEST - 2 VIEW  Comparison: One-view chest 12/24/2007  Findings: The heart size is normal.  Mild interstitial coarsening is present.  No focal airspace disease is evident.  The visualized soft tissues and bony thorax are unremarkable.  IMPRESSION:  1.  No acute cardiopulmonary disease or significant interval change.   Original Report Authenticated By: Marin Roberts, M.D.   Ct Head Wo Contrast  03/19/2013   *RADIOLOGY REPORT*  Clinical Data: Left-sided weakness for 2 months.  CT HEAD WITHOUT CONTRAST  Technique:  Contiguous axial images were obtained from the base of the skull through the vertex without contrast.  Comparison: MRI brain 09/03/2008.  CT head 09/20/2005  Findings: There is a focal area of encephalomalacia in the left occipital region.  This is new since the previous CT but appears to been present on the prior MRI study.  This likely  represents old area of infarct.  Mild diffuse cerebral atrophy.  No mass effect or midline shift.  No abnormal extra-axial fluid collections.  Wallace Cullens- white matter junctions are distinct.  Basal cisterns are not effaced.  No evidence of acute intracranial hemorrhage.  No depressed skull fractures.  Visualized paranasal sinuses and mastoid air cells are not opacified.  IMPRESSION: Probable old ischemic focus in the left occipital region.  Mild atrophy.  No acute intracranial abnormalities are demonstrated.   Original Report Authenticated By: Burman Nieves, M.D.   US Abdomen Complete  03/18/2013   *RADIOLOGY REPORT*  Clinical Data:  Abdominal pain.  Nausea and vomiting.  COMPLETE ABDOMINAL ULTRASOUND  Comparison:  None.  Findings:  Gallbladder:  A single stone is identified in the neck of the gallbladder measuring 0.8 cm.  No wall thickening or pericholecystic fluid is identified.  Sonographer reports negative Murphy's sign.  Common bile duct:  Measures 0.8 cm.  Liver:  Demonstrates increased echogenicity consistent with fatty infiltration.  No focal lesion or intrahepatic biliary ductal dilatation.  IVC:  Appears normal.  Pancreas:  No focal abnormality seen.  Spleen:  Measures 5.6 cm and appears normal.  Right Kidney:  Knee measures 11.7 cm and appears normal.  Left Kidney:  Measures 11.4 cm and appears normal.  Abdominal aorta:  No aneurysm identified.  IMPRESSION:  1.  Single small gallstone without evidence of cholecystitis. 2.  Fatty infiltration of the liver.   Original Report Authenticated By: Holley Dexter, M.D.      Assessment/Plan  Active Problems:   CROHN'S DISEASE, HX OF   Alcohol abuse   Left hemiplegia   Left leg pain with spastic paresis   PLAN: Admit this gentleman for the benefits of a neurology evaluation, and determination of the breast MR study, to find the cause of the increased spasticity and pain in the left lower extremity Check B12 and RPR Pain management Nicotine  cessation Alcohol abuse counseling Other plans as per orders.  Code Status: Full code Family Communication: Plans discuss with patient  Disposition Plan:  Likely a day or 2   Glennis Montenegro Nocturnist [facility] Pager [phone]   03/19/2013, 10:02 PM

## 2013-03-19 NOTE — ED Notes (Signed)
States that he has been having problems with left sided weakness for about 2 months.  States that he has a history of a brain injury in the past.  States he has been having trouble with his balance recently.

## 2013-03-19 NOTE — ED Provider Notes (Signed)
History    CSN: [id number] Arrival date & time 03/19/13  1607  First MD Initiated Contact with Patient 03/19/13 1909     Chief Complaint  Patient presents with  . Weakness  . Extremity Weakness    HPI Pt was seen at 1925. Per pt, c/o gradual onset and persistence of constant LUE and LLE weakness for the past 2 months, worse over the past week. Has been associated with "numbness" and "walking off balance." Pt states he cannot lift up his LLE when walking and he has been "dragging it." Pt also c/o gradual onset and persistence of constant left calf "pain" for the past 2 months, worse over the past 4 days. Denies swelling, no rash, no fevers. Denies back pain, no neck pain, no headache, no visual changes, no CP/SOB, no abd pain.     Past Medical History  Diagnosis Date  . Crohn disease   . Acid reflux   . Hypertension   . Alcoholic hepatitis   . Arthritis   . Dumping syndrome   . Paraesophageal hernia   . Chronic diarrhea   . Subdural hematoma   . Gastroparesis   . Migraine headache   . Chronic back pain   . TIA (transient ischemic attack)    Past Surgical History  Procedure Laterality Date  . Nissen fundoplication    . Colon resection Right   . Pyloroplasty  2009   Family History  Problem Relation Age of Onset  . Healthy Daughter   . Healthy Son    History  Substance Use Topics  . Smoking status: Current Some Day Smoker -- 0.50 packs/day    Types: Cigarettes  . Smokeless tobacco: Never Used     Comment: 2-3 cigarrettes a day  . Alcohol Use: Yes     Comment: He is drinking 3-4 drinks a day.     Review of Systems ROS: Statement: All systems negative except as marked or noted in the HPI; Constitutional: Negative for fever and chills. ; ; Eyes: Negative for eye pain, redness and discharge. ; ; ENMT: Negative for ear pain, hoarseness, nasal congestion, sinus pressure and sore throat. ; ; Cardiovascular: Negative for chest pain, palpitations, diaphoresis, dyspnea and  peripheral edema. ; ; Respiratory: Negative for cough, wheezing and stridor. ; ; Gastrointestinal: Negative for nausea, vomiting, diarrhea, abdominal pain, blood in stool, hematemesis, jaundice and rectal bleeding. . ; ; Genitourinary: Negative for dysuria, flank pain and hematuria. ; ; Musculoskeletal: Negative for back pain and neck pain. Negative for swelling and trauma.; ; Skin: Negative for pruritus, rash, abrasions, blisters, bruising and skin lesion.; ; Neuro: +extremity weakness, paresthesias. Negative for headache, lightheadedness and neck stiffness. Negative for altered level of consciousness , altered mental status, involuntary movement, seizure and syncope.       Allergies  Review of patient's allergies indicates no known allergies.  Home Medications   Current Outpatient Rx  Name  Route  Sig  Dispense  Refill  . ALPRAZolam (XANAX XR) 0.5 MG 24 hr tablet   Oral   Take 1 tablet (0.5 mg total) by mouth every morning.   14 tablet   0   . ibuprofen (ADVIL,MOTRIN) 200 MG tablet   Oral   Take 200 mg by mouth every 6 (six) hours as needed. inflammation/pain          . Multiple Vitamin (MULTIVITAMIN WITH MINERALS) TABS   Oral   Take 1 tablet by mouth daily.         Marland Kitchen  ranitidine (ZANTAC) 150 MG tablet   Oral   Take 1 tablet (150 mg total) by mouth 2 (two) times daily.   60 tablet   5   . cetirizine (ZYRTEC) 10 MG tablet   Oral   Take 10 mg by mouth daily.            BP 135/69  Pulse 85  Temp(Src) 98.1 F (36.7 C) (Oral)  Resp 16  Ht 6' (1.829 m)  Wt 160 lb (72.576 kg)  BMI 21.7 kg/m2  SpO2 98% Physical Exam 1930: Physical examination:  Nursing notes reviewed; Vital signs and O2 SAT reviewed;  Constitutional: Well developed, Well nourished, Well hydrated, In no acute distress; Head:  Normocephalic, atraumatic; Eyes: EOMI, PERRL, No scleral icterus; ENMT: Mouth and pharynx normal, Mucous membranes moist; Neck: Supple, Full range of motion, No lymphadenopathy;  Cardiovascular: Regular rate and rhythm, No gallop; Respiratory: Breath sounds clear & equal bilaterally, No wheezes.  Speaking full sentences with ease, Normal respiratory effort/excursion; Chest: Nontender, Movement normal; Abdomen: Soft, Nontender, Nondistended, Normal bowel sounds; Genitourinary: No CVA tenderness; Extremities: Pulses normal, No deformity. No tenderness, No edema, No calf tenderness, edema or asymmetry.; Neuro: AA&Ox3, vague historian. Major CN grossly intact. Speech clear. +mild left facial droop. Left grip < right grip. RUE and RLE 5/5 strength. LUE and LLE 4/5 strength. +drift LLE, +ataxia LUE and LLE on cerebellar testing.; Skin: Color normal, Warm, Dry.   ED Course  Procedures    MDM  MDM Reviewed: previous chart, nursing note and vitals Reviewed previous: labs and ECG Interpretation: labs, ECG, x-ray and CT scan    Date: 03/19/2013  Rate: 79  Rhythm: normal sinus rhythm  QRS Axis: normal  Intervals: normal  ST/T Wave abnormalities: normal  Conduction Disutrbances:none  Narrative Interpretation:   Old EKG Reviewed: unchanged; no significant changes from previous EKG dated 07/01/2012.  Results for orders placed during the hospital encounter of 03/19/13  URINALYSIS W MICROSCOPIC + REFLEX CULTURE      Result Value Range   Color, Urine YELLOW  YELLOW   APPearance CLEAR  CLEAR   Specific Gravity, Urine <1.005 (*) 1.005 - 1.030   pH 6.5  5.0 - 8.0   Glucose, UA NEGATIVE  NEGATIVE mg/dL   Hgb urine dipstick NEGATIVE  NEGATIVE   Bilirubin Urine NEGATIVE  NEGATIVE   Ketones, ur NEGATIVE  NEGATIVE mg/dL   Protein, ur NEGATIVE  NEGATIVE mg/dL   Urobilinogen, UA 0.2  0.0 - 1.0 mg/dL   Nitrite NEGATIVE  NEGATIVE   Leukocytes, UA NEGATIVE  NEGATIVE   Urine-Other       Value: NO FORMED ELEMENTS SEEN ON URINE MICROSCOPIC EXAMINATION  CBC WITH DIFFERENTIAL      Result Value Range   WBC 4.7  4.0 - 10.5 K/uL   RBC 4.46  4.22 - 5.81 MIL/uL   Hemoglobin 14.3  13.0 -  17.0 g/dL   HCT 40.7  39.0 - 52.0 %   MCV 91.3  78.0 - 100.0 fL   MCH 32.1  26.0 - 34.0 pg   MCHC 35.1  30.0 - 36.0 g/dL   RDW 14.4  11.5 - 15.5 %   Platelets 122 (*) 150 - 400 K/uL   Neutrophils Relative % 43  43 - 77 %   Neutro Abs 2.0  1.7 - 7.7 K/uL   Lymphocytes Relative 45  12 - 46 %   Lymphs Abs 2.1  0.7 - 4.0 K/uL   Monocytes Relative 10  3 -  12 %   Monocytes Absolute 0.5  0.1 - 1.0 K/uL   Eosinophils Relative 1  0 - 5 %   Eosinophils Absolute 0.1  0.0 - 0.7 K/uL   Basophils Relative 1  0 - 1 %   Basophils Absolute 0.0  0.0 - 0.1 K/uL  TROPONIN I      Result Value Range   Troponin I <0.30  <0.30 ng/mL  COMPREHENSIVE METABOLIC PANEL      Result Value Range   Sodium 137  135 - 145 mEq/L   Potassium 3.6  3.5 - 5.1 mEq/L   Chloride 98  96 - 112 mEq/L   CO2 24  19 - 32 mEq/L   Glucose, Bld 109 (*) 70 - 99 mg/dL   BUN 11  6 - 23 mg/dL   Creatinine, Ser 0.97  0.50 - 1.35 mg/dL   Calcium 10.0  8.4 - 10.5 mg/dL   Total Protein 7.8  6.0 - 8.3 g/dL   Albumin 4.1  3.5 - 5.2 g/dL   AST 114 (*) 0 - 37 U/L   ALT 136 (*) 0 - 53 U/L   Alkaline Phosphatase 88  39 - 117 U/L   Total Bilirubin 0.8  0.3 - 1.2 mg/dL   GFR calc non Af Amer 89 (*) >90 mL/min   GFR calc Af Amer >90  >90 mL/min  AMMONIA      Result Value Range   Ammonia 32  11 - 60 umol/L  URINE RAPID DRUG SCREEN (HOSP PERFORMED)      Result Value Range   Opiates NONE DETECTED  NONE DETECTED   Cocaine NONE DETECTED  NONE DETECTED   Benzodiazepines NONE DETECTED  NONE DETECTED   Amphetamines NONE DETECTED  NONE DETECTED   Tetrahydrocannabinol POSITIVE (*) NONE DETECTED   Barbiturates NONE DETECTED  NONE DETECTED  ETHANOL      Result Value Range   Alcohol, Ethyl (B) 184 (*) 0 - 11 mg/dL   Dg Chest 2 View 03/19/2013   *RADIOLOGY REPORT*  Clinical Data: Nausea and vomiting.  CHEST - 2 VIEW  Comparison: One-view chest 12/24/2007  Findings: The heart size is normal.  Mild interstitial coarsening is present.  No focal  airspace disease is evident.  The visualized soft tissues and bony thorax are unremarkable.  IMPRESSION:  1.  No acute cardiopulmonary disease or significant interval change.   Original Report Authenticated By: San Morelle, M.D.   Ct Head Wo Contrast 03/19/2013   *RADIOLOGY REPORT*  Clinical Data: Left-sided weakness for 2 months.  CT HEAD WITHOUT CONTRAST  Technique:  Contiguous axial images were obtained from the base of the skull through the vertex without contrast.  Comparison: MRI brain 09/03/2008.  CT head 09/20/2005  Findings: There is a focal area of encephalomalacia in the left occipital region.  This is new since the previous CT but appears to been present on the prior MRI study.  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  Mild diffuse cerebral atrophy.  No mass effect or midline shift.  No abnormal extra-axial fluid collections.  Pearline Cables- white matter junctions are distinct.  Basal cisterns are not effaced.  No evidence of acute intracranial hemorrhage.  No depressed skull fractures.  Visualized paranasal sinuses and mastoid air cells are not opacified.  IMPRESSION: Probable old ischemic focus in the left occipital region.  Mild atrophy.  No acute intracranial abnormalities are demonstrated.   Original Report Authenticated By: Lucienne Capers, M.D.   Results for Enos Fling R (  MRN [id number]) as of 03/19/2013 22:01  Ref. Range 11/07/2011 12:43 07/01/2012 02:31 03/13/2013 15:25 03/19/2013 20:05  AST Latest Range: 0-37 U/L 19 23 108 (H) 114 (H)  ALT Latest Range: 0-53 U/L 9 22 120 (H) 136 (H)     2145:  Known LFT elevation. Left sided weakness on exam. No LBP, abd soft/NT and no hx AAA on previous imaging studies; doubt same today,. No afib on EKG. Strong pedal pulses in left foot; doubt occlusion. No calf swelling to suggest DVT at this time. 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 No new infarct on CT scan to account for symptoms today. Will need MRI to r/o CVA. Pt not code stroke  nor TPA candidate due to unknown onset of symptoms. No progression of symptoms while in the ED. Pt states he does not have a PMD. Dx and testing d/w pt and family.  Questions answered.  Verb understanding, agreeable to admit. T/C to [facility]. Megan Salon, case discussed, including:  HPI, pertinent PM/SHx, VS/PE, dx testing, ED course and treatment:  Agreeable to observation admit, requests to write temporary orders, obtain tele bed to team 1.      Alfonzo Feller, DO 03/21/13 [phone]

## 2013-03-20 ENCOUNTER — Observation Stay (HOSPITAL_COMMUNITY): Payer: Medicaid Other

## 2013-03-20 ENCOUNTER — Encounter (HOSPITAL_COMMUNITY): Payer: Self-pay | Admitting: Emergency Medicine

## 2013-03-20 DIAGNOSIS — G8194 Hemiplegia, unspecified affecting left nondominant side: Secondary | ICD-10-CM | POA: Diagnosis present

## 2013-03-20 DIAGNOSIS — M79605 Pain in left leg: Secondary | ICD-10-CM | POA: Diagnosis present

## 2013-03-20 DIAGNOSIS — F10229 Alcohol dependence with intoxication, unspecified: Secondary | ICD-10-CM

## 2013-03-20 LAB — VITAMIN B12: Vitamin B-12: 1015 pg/mL — ABNORMAL HIGH (ref 211–911)

## 2013-03-20 LAB — TSH: TSH: 2.02 u[IU]/mL (ref 0.350–4.500)

## 2013-03-20 MED ORDER — ACETAMINOPHEN 500 MG PO TABS
500.0000 mg | ORAL_TABLET | Freq: Four times a day (QID) | ORAL | Status: DC | PRN
  Administered 2013-03-20: 500 mg via ORAL
  Filled 2013-03-20: qty 1

## 2013-03-20 MED ORDER — LORAZEPAM 2 MG/ML IJ SOLN
0.0000 mg | Freq: Four times a day (QID) | INTRAMUSCULAR | Status: DC
  Administered 2013-03-20 (×3): 1 mg via INTRAVENOUS
  Administered 2013-03-21: 2 mg via INTRAVENOUS
  Filled 2013-03-20 (×2): qty 1
  Filled 2013-03-20: qty 2
  Filled 2013-03-20: qty 1

## 2013-03-20 MED ORDER — FLEET ENEMA 7-19 GM/118ML RE ENEM: 1.0000 | ENEMA | Freq: Once | RECTAL | Status: AC | PRN

## 2013-03-20 MED ORDER — ADULT MULTIVITAMIN W/MINERALS CH
1.0000 | ORAL_TABLET | Freq: Every day | ORAL | Status: DC
  Administered 2013-03-20 – 2013-03-21 (×2): 1 via ORAL
  Filled 2013-03-20 (×2): qty 1

## 2013-03-20 MED ORDER — LORAZEPAM 2 MG/ML IJ SOLN: 1.0000 mg | Freq: Four times a day (QID) | INTRAMUSCULAR | Status: DC | PRN

## 2013-03-20 MED ORDER — OXYCODONE HCL 5 MG PO TABS
5.0000 mg | ORAL_TABLET | Freq: Four times a day (QID) | ORAL | Status: DC | PRN
  Administered 2013-03-20 – 2013-03-21 (×4): 5 mg via ORAL
  Filled 2013-03-20 (×4): qty 1

## 2013-03-20 MED ORDER — POTASSIUM CHLORIDE IN NACL 20-0.9 MEQ/L-% IV SOLN
INTRAVENOUS | Status: DC
  Administered 2013-03-20: 100 mL/h via INTRAVENOUS
  Administered 2013-03-20: 16:00:00 via INTRAVENOUS
  Filled 2013-03-20: qty 1000

## 2013-03-20 MED ORDER — THIAMINE HCL 100 MG/ML IJ SOLN
100.0000 mg | Freq: Every day | INTRAMUSCULAR | Status: DC
  Administered 2013-03-20: 100 mg via INTRAVENOUS
  Filled 2013-03-20: qty 2

## 2013-03-20 MED ORDER — ONDANSETRON HCL 4 MG/2ML IJ SOLN
4.0000 mg | INTRAMUSCULAR | Status: DC | PRN
  Administered 2013-03-20: 4 mg via INTRAVENOUS
  Filled 2013-03-20: qty 2

## 2013-03-20 MED ORDER — ENOXAPARIN SODIUM 30 MG/0.3ML ~~LOC~~ SOLN
30.0000 mg | SUBCUTANEOUS | Status: DC
  Administered 2013-03-21: 30 mg via SUBCUTANEOUS
  Filled 2013-03-20: qty 0.3

## 2013-03-20 MED ORDER — ENOXAPARIN SODIUM 40 MG/0.4ML ~~LOC~~ SOLN
40.0000 mg | SUBCUTANEOUS | Status: DC
  Administered 2013-03-20: 40 mg via SUBCUTANEOUS
  Filled 2013-03-20: qty 0.4

## 2013-03-20 MED ORDER — TRAZODONE HCL 50 MG PO TABS
50.0000 mg | ORAL_TABLET | Freq: Every evening | ORAL | Status: DC | PRN
  Filled 2013-03-20: qty 1

## 2013-03-20 MED ORDER — FOLIC ACID 1 MG PO TABS
1.0000 mg | ORAL_TABLET | Freq: Every day | ORAL | Status: DC
  Administered 2013-03-20 – 2013-03-21 (×2): 1 mg via ORAL
  Filled 2013-03-20 (×2): qty 1

## 2013-03-20 MED ORDER — PANTOPRAZOLE SODIUM 40 MG PO TBEC
40.0000 mg | DELAYED_RELEASE_TABLET | Freq: Two times a day (BID) | ORAL | Status: DC
  Administered 2013-03-20 – 2013-03-21 (×3): 40 mg via ORAL
  Filled 2013-03-20 (×3): qty 1

## 2013-03-20 MED ORDER — IBUPROFEN 600 MG PO TABS: 600.0000 mg | ORAL_TABLET | Freq: Four times a day (QID) | ORAL | Status: DC | PRN

## 2013-03-20 MED ORDER — SODIUM CHLORIDE 0.9 % IJ SOLN
3.0000 mL | Freq: Two times a day (BID) | INTRAMUSCULAR | Status: DC
  Administered 2013-03-20 – 2013-03-21 (×3): 3 mL via INTRAVENOUS

## 2013-03-20 MED ORDER — LORAZEPAM 1 MG PO TABS
1.0000 mg | ORAL_TABLET | Freq: Four times a day (QID) | ORAL | Status: DC | PRN
  Administered 2013-03-20 – 2013-03-21 (×3): 1 mg via ORAL
  Filled 2013-03-20 (×3): qty 1

## 2013-03-20 MED ORDER — IBUPROFEN 600 MG PO TABS
600.0000 mg | ORAL_TABLET | Freq: Three times a day (TID) | ORAL | Status: DC
  Administered 2013-03-20 (×2): 600 mg via ORAL
  Filled 2013-03-20 (×2): qty 2
  Filled 2013-03-20: qty 1

## 2013-03-20 MED ORDER — LORAZEPAM 2 MG/ML IJ SOLN: 0.0000 mg | Freq: Two times a day (BID) | INTRAMUSCULAR | Status: DC

## 2013-03-20 MED ORDER — ASPIRIN EC 81 MG PO TBEC
81.0000 mg | DELAYED_RELEASE_TABLET | Freq: Every day | ORAL | Status: DC
  Administered 2013-03-21 (×2): 81 mg via ORAL
  Filled 2013-03-20 (×3): qty 1

## 2013-03-20 MED ORDER — VITAMIN B-1 100 MG PO TABS
100.0000 mg | ORAL_TABLET | Freq: Every day | ORAL | Status: DC
  Administered 2013-03-21: 100 mg via ORAL
  Filled 2013-03-20 (×2): qty 1

## 2013-03-20 NOTE — ED Notes (Signed)
Dr Orvan Falconer in earlier and informed of patient request for more pain medication.  While awaiting orders patient ambulated to Bathroom, then states he is going home, putting on his pants.  States he needs something for pain- thinks it has been an hour since he asked for pain med.    Appears to be more agitated than previous hour.

## 2013-03-20 NOTE — Progress Notes (Signed)
[facility] PROGRESS NOTE  Gary Vasquez NLG:[id number] DOB: [birth year] DOA: 03/19/2013 PCP: Default, Provider, MD  Assessment/Plan: 1. Subacute left lower extremity weakness: Not clearly seen on examination today. No focal weakness noted. Ambulates without assistance. Present for one month or more. No previous injury but does have chronic low back pain. Check MRI of the lumbar spine. Significance of calf pain is unclear. Dorsalis pedis pulses brisk and 2+. Normal perfusion of the foot. History does not suggest arterial insufficiency. 2. Reported left upper extremity weakness and facial droop: This was noted in the emergency department in some extremity weakness noted by admitting physician. Currently there is no evidence of facial weakness or left upper extremity weakness. Significance unclear and may have been related to intoxication. We will check MRI if negative would not pursue further evaluation. 3. Alcohol intoxication: Resolved 4. Alcoholic liver disease, hepatitis, abuse: Monitor for withdrawal. Followup with GI as an outpatient.     MRI brain, lumbar spine  Followup neurology recommendations    if imaging unremarkable consider discharge 7/25  Code Status:  full code  DVT prophylaxis: Lovenox Family Communication: none present Disposition Plan: as above  Murray Hodgkins, MD  [facility]  Pager [phone] If 7PM-7AM, please contact night-coverage at [email or web address], password [facility] 03/20/2013, 3:29 PM  LOS: 1 day   Clinical Summary: 62 year old man with history of alcohol abuse presented to the emergency department intoxicated, complaining of difficulty walking secondary to left leg pain and weakness progressive over the last 1-2 months. Exam in the emergency department revealed left-sided weakness, left facial droop and admission was recommended. While waiting for admission patient's ambulation improved significantly and is able to walk to the restroom without  assistance. No speech or swallowing problems were noted. He ate a meal in the emergency department.  Consultants:  Neurology  Procedures:    HPI/Subjective: Complains of continued left leg pain. This has been going on for at least one month. No injury noted. It is primarily deep pain in his calf and some sensory disturbance over his toes and forefoot. He does complain of some left weakness and difficulty lifting his foot. No noted bowel or bladder problems. Chronic low back problems he does report some left arm weakness over the last month. No facial weakness, difficulty speaking or eating.   Objective: Filed Vitals:   03/20/13 1000 03/20/13 1159 03/20/13 1303 03/20/13 1420  BP: 151/92 151/92 146/92 155/92  Pulse: 67 104 73 75  Temp:      TempSrc:      Resp:   16 16  Height:      Weight:      SpO2: 97%  98% 97%    Intake/Output Summary (Last 24 hours) at 03/20/13 1529 Last data filed at 03/20/13 0840  Gross per 24 hour  Intake      0 ml  Output      0 ml  Net      0 ml     Filed Weights   03/19/13 1850  Weight: 72.576 kg (160 lb)    Exam:   Afebrile, vital signs stable.  General: Appears calm and comfortable.   Neurologic: Cranial nerves 2-12 intact. Speech is fluent and clear. No dysdiadochokinesis of the upper extremities. I do not appreciate clonus in the left lower extremity.  Musculoskeletal: Sitting on side of bed eating lunch. Stands spontaneously without assistance and ambulates without assistance. Possibly some difficulty lifting left foot though not clearly reproducible. Negative Romberg. Able to move forward and backwards  without loss of balance. Strength in the upper extremities is 5/5 and symmetric. He eats with his left hand, easily grasps potato chips and sandwich. Strength in the bilateral lower trapezius is 5/5 and symmetric. Sensation left leg and foot is grossly intact.  Cardiovascular: Regular rate and rhythm. No murmur, rub, gallop. No lower  extremity edema.  Respiratory: Clear to auscultation bilaterally. No wheezes, rales, rhonchi. Normal respiratory effort.  Psychiatric: Flat affect. Mood appears grossly normal. Speech fluent and clear.  Data Reviewed:  Vitamin B12 high  Complete metabolic panel unremarkable with the exception of elevated transaminases, likely secondary to alcohol abuse  Chronic thrombocytopenia seen and appear stable  Hemoglobin A1c 5.5. TSH normal.   Urinalysis negative   urine drug screen positive for  Pending studies:   MRI   Scheduled Meds: . aspirin EC  81 mg Oral Daily  . [START ON 03/21/2013] enoxaparin (LOVENOX) injection  30 mg Subcutaneous Q24H  . folic acid  1 mg Oral Daily  . ibuprofen  600 mg Oral TID  . LORazepam  0-4 mg Intravenous Q6H   Followed by  . [START ON 03/22/2013] LORazepam  0-4 mg Intravenous Q12H  . multivitamin with minerals  1 tablet Oral Daily  . pantoprazole  40 mg Oral BID AC  . sodium chloride  3 mL Intravenous Q12H  . thiamine  100 mg Oral Daily   Or  . thiamine  100 mg Intravenous Daily   Continuous Infusions: . 0.9 % NaCl with KCl 20 mEq / L 100 mL/hr (03/20/13 0542)    Active Problems:   CROHN'S DISEASE, HX OF   Alcohol abuse   Left hemiplegia   Left leg pain   Time spent 25  minutes

## 2013-03-20 NOTE — ED Notes (Signed)
Pt with c/o anxiety and mild shakiness

## 2013-03-21 ENCOUNTER — Observation Stay (HOSPITAL_COMMUNITY): Payer: Medicaid Other

## 2013-03-21 LAB — SEDIMENTATION RATE: Sed Rate: 5 mm/hr (ref 0–16)

## 2013-03-21 NOTE — Consult Note (Signed)
[facility] Emmanuelle Coxe A. Gerilyn Pilgrim, MD     [email or web address]          Gary Vasquez is an 62 y.o. male.   ASSESSMENT/PLAN: 1. Subacute gait impairment likely due to peripheral neuropathy based on the physical examination. Radiculopathy is also possibility but imaging seem not to suggest significant cause of lumbosacral radiculopathy. Cervical myelopathy is a possibility but the examination argues against this. The potential etiology for the peripheral neuropathy includes alcoholism, nutritional deficiency, vasculitis and paraneoplastic syndromes. A cervical spine MRI will be obtained. A thoracic spine x-ray will also be obtained. The patient will need to have a dedicated EMG of the legs but this will have to be arranged in the outpatient setting. Additional labs will also be obtained to evaluate for potential causes of neuropathy. I suggest that the patient undergo physical therapy. A ankle-foot orthotics is also suggested to help prevent falls and to help his gait.  The patient is a 62 year old man who has a long-standing history of alcoholism. He presents about 2 month history of worsening left leg pain and weakness. It appears that the patient has also had some numbness and tingling of the right foot. There is also pain involving the left shoulder and left upper extremity. He also reports some back pain. The patient has had an extensive workup so far includes MRI of the brain and lumbar spine which shows some chronic findings but nothing to explain his symptoms. Labs have also been unremarkable. The review of systems is otherwise unremarkable.  GENERAL: Thin man in no acute distress.  HEENT: Patient to be a stye involving the left lower eyelid.  ABDOMEN: soft  EXTREMITIES: No edema   BACK: Unremarkable.  SKIN: Normal by inspection.    MENTAL STATUS: Alert and oriented. Speech, language and cognition are generally intact. Judgment and insight normal.   CRANIAL NERVES: Pupils  are equal, round and reactive to light and accommodation; extra ocular movements are full, there is no significant nystagmus; visual fields are full; upper and lower facial muscles are normal in strength and symmetric, there is no flattening of the nasolabial folds; tongue is midline; uvula is midline; shoulder elevation is normal.  MOTOR: There is significant weakness of the left lower extremity. The hip flexion is graded as 4/5; knee extension and flexion 4+ to 5; dorsiflexion 0/5; plantarflexion 3/5. The patient does not have increased tone of the left lower extremity. There is no loss of muscle bulk. The other extremities shows normal tone, bulk and strength.  COORDINATION: Left finger to nose is normal, right finger to nose is normal, No rest tremor; no intention tremor; no postural tremor; no bradykinesia.  REFLEXES: Deep tendon reflexes are symmetrical and normal in the upper extremities. The patient has a drop reflex at the left patella and diminished at the left ankle. Reflexes are normal to slightly brisk right lower extremity. Plantar responses are flexor bilaterally.   SENSATION: Seems mostly unremarkable although there may be some suggestion of sensory level T5 to temperature and light touch.  GAIT: This is unsteady with the patient having a moderate foot drop on the left.    Past Medical History  Diagnosis Date  . Crohn disease   . Acid reflux   . Hypertension   . Alcoholic hepatitis   . Arthritis   . Dumping syndrome   . Paraesophageal hernia   . Chronic diarrhea   . Subdural hematoma   . Gastroparesis   . Migraine headache   . Chronic  back pain   . TIA (transient ischemic attack)     Past Surgical History  Procedure Laterality Date  . Nissen fundoplication    . Colon resection Right   . Pyloroplasty  2009    Family History  Problem Relation Age of Onset  . Healthy Daughter   . Healthy Son     Social History:  reports that he has been smoking Cigarettes.  He  has been smoking about 0.50 packs per day. He has never used smokeless tobacco. He reports that  drinks alcohol. He reports that he does not use illicit drugs.  Allergies: No Known Allergies  Medications: Prior to Admission medications   Medication Sig Start Date End Date Taking? Authorizing Provider  ALPRAZolam (XANAX XR) 0.5 MG 24 hr tablet Take 1 tablet (0.5 mg total) by mouth every morning. 03/13/13  Yes Len Blalock, NP  ibuprofen (ADVIL,MOTRIN) 200 MG tablet Take 200 mg by mouth every 6 (six) hours as needed. inflammation/pain    Yes Historical Provider, MD  Multiple Vitamin (MULTIVITAMIN WITH MINERALS) TABS Take 1 tablet by mouth daily.   Yes Historical Provider, MD  ranitidine (ZANTAC) 150 MG tablet Take 1 tablet (150 mg total) by mouth 2 (two) times daily. 11/07/11  Yes Malissa Hippo, MD  cetirizine (ZYRTEC) 10 MG tablet Take 10 mg by mouth daily.      Historical Provider, MD    Scheduled Meds: . aspirin EC  81 mg Oral Daily  . enoxaparin (LOVENOX) injection  30 mg Subcutaneous Q24H  . folic acid  1 mg Oral Daily  . LORazepam  0-4 mg Intravenous Q6H   Followed by  . [START ON 03/22/2013] LORazepam  0-4 mg Intravenous Q12H  . multivitamin with minerals  1 tablet Oral Daily  . pantoprazole  40 mg Oral BID AC  . sodium chloride  3 mL Intravenous Q12H  . thiamine  100 mg Oral Daily   Or  . thiamine  100 mg Intravenous Daily   Continuous Infusions:  PRN Meds:.ibuprofen, LORazepam, LORazepam, ondansetron (ZOFRAN) IV, oxyCODONE, traZODone   Blood pressure 112/63, pulse 69, temperature 97.6 F (36.4 C), temperature source Oral, resp. rate 18, height 5\' 9"  (1.753 m), weight 71.5 kg (157 lb 10.1 oz), SpO2 97.00%.   Results for orders placed during the hospital encounter of 03/19/13 (from the past 48 hour(s))  CBC WITH DIFFERENTIAL     Status: Abnormal   Collection Time    03/19/13  8:00 PM      Result Value Range   WBC 4.7  4.0 - 10.5 K/uL   RBC 4.46  4.22 - 5.81 MIL/uL    Hemoglobin 14.3  13.0 - 17.0 g/dL   HCT 45.4  09.8 - 11.9 %   MCV 91.3  78.0 - 100.0 fL   MCH 32.1  26.0 - 34.0 pg   MCHC 35.1  30.0 - 36.0 g/dL   RDW 14.7  82.9 - 56.2 %   Platelets 122 (*) 150 - 400 K/uL   Neutrophils Relative % 43  43 - 77 %   Neutro Abs 2.0  1.7 - 7.7 K/uL   Lymphocytes Relative 45  12 - 46 %   Lymphs Abs 2.1  0.7 - 4.0 K/uL   Monocytes Relative 10  3 - 12 %   Monocytes Absolute 0.5  0.1 - 1.0 K/uL   Eosinophils Relative 1  0 - 5 %   Eosinophils Absolute 0.1  0.0 - 0.7 K/uL   Basophils  Relative 1  0 - 1 %   Basophils Absolute 0.0  0.0 - 0.1 K/uL  TROPONIN I     Status: None   Collection Time    03/19/13  8:00 PM      Result Value Range   Troponin I <0.30  <0.30 ng/mL   Comment:            Due to the release kinetics of cTnI,     a negative result within the first hours     of the onset of symptoms does not rule out     myocardial infarction with certainty.     If myocardial infarction is still suspected,     repeat the test at appropriate intervals.  COMPREHENSIVE METABOLIC PANEL     Status: Abnormal   Collection Time    03/19/13  8:05 PM      Result Value Range   Sodium 137  135 - 145 mEq/L   Potassium 3.6  3.5 - 5.1 mEq/L   Chloride 98  96 - 112 mEq/L   CO2 24  19 - 32 mEq/L   Glucose, Bld 109 (*) 70 - 99 mg/dL   BUN 11  6 - 23 mg/dL   Creatinine, Ser 1.61  0.50 - 1.35 mg/dL   Calcium 09.6  8.4 - 04.5 mg/dL   Total Protein 7.8  6.0 - 8.3 g/dL   Albumin 4.1  3.5 - 5.2 g/dL   AST 409 (*) 0 - 37 U/L   ALT 136 (*) 0 - 53 U/L   Alkaline Phosphatase 88  39 - 117 U/L   Total Bilirubin 0.8  0.3 - 1.2 mg/dL   GFR calc non Af Amer 89 (*) >90 mL/min   GFR calc Af Amer >90  >90 mL/min   Comment:            The eGFR has been calculated     using the CKD EPI equation.     This calculation has not been     validated in all clinical     situations.     eGFR's persistently     <90 mL/min signify     possible Chronic Kidney Disease.  ETHANOL     Status:  Abnormal   Collection Time    03/19/13  8:05 PM      Result Value Range   Alcohol, Ethyl (B) 184 (*) 0 - 11 mg/dL   Comment:            LOWEST DETECTABLE LIMIT FOR     SERUM ALCOHOL IS 11 mg/dL     FOR MEDICAL PURPOSES ONLY  AMMONIA     Status: None   Collection Time    03/19/13  8:06 PM      Result Value Range   Ammonia 32  11 - 60 umol/L  URINALYSIS W MICROSCOPIC + REFLEX CULTURE     Status: Abnormal   Collection Time    03/19/13  8:15 PM      Result Value Range   Color, Urine YELLOW  YELLOW   APPearance CLEAR  CLEAR   Specific Gravity, Urine <1.005 (*) 1.005 - 1.030   pH 6.5  5.0 - 8.0   Glucose, UA NEGATIVE  NEGATIVE mg/dL   Hgb urine dipstick NEGATIVE  NEGATIVE   Bilirubin Urine NEGATIVE  NEGATIVE   Ketones, ur NEGATIVE  NEGATIVE mg/dL   Protein, ur NEGATIVE  NEGATIVE mg/dL   Urobilinogen, UA 0.2  0.0 - 1.0 mg/dL  Nitrite NEGATIVE  NEGATIVE   Leukocytes, UA NEGATIVE  NEGATIVE   Urine-Other       Value: NO FORMED ELEMENTS SEEN ON URINE MICROSCOPIC EXAMINATION  URINE RAPID DRUG SCREEN (HOSP PERFORMED)     Status: Abnormal   Collection Time    03/19/13  8:15 PM      Result Value Range   Opiates NONE DETECTED  NONE DETECTED   Cocaine NONE DETECTED  NONE DETECTED   Benzodiazepines NONE DETECTED  NONE DETECTED   Amphetamines NONE DETECTED  NONE DETECTED   Tetrahydrocannabinol POSITIVE (*) NONE DETECTED   Barbiturates NONE DETECTED  NONE DETECTED   Comment:            DRUG SCREEN FOR MEDICAL PURPOSES     ONLY.  IF CONFIRMATION IS NEEDED     FOR ANY PURPOSE, NOTIFY LAB     WITHIN 5 DAYS.                LOWEST DETECTABLE LIMITS     FOR URINE DRUG SCREEN     Drug Class       Cutoff (ng/mL)     Amphetamine      1000     Barbiturate      200     Benzodiazepine   200     Tricyclics       300     Opiates          300     Cocaine          300     THC              50  VITAMIN B12     Status: Abnormal   Collection Time    03/20/13  4:58 AM      Result Value Range    Vitamin B-12 1015 (*) 211 - 911 pg/mL  RPR     Status: None   Collection Time    03/20/13  4:58 AM      Result Value Range   RPR NON REACTIVE  NON REACTIVE  HOMOCYSTEINE     Status: None   Collection Time    03/20/13  4:58 AM      Result Value Range   Homocysteine 10.8  4.0 - 15.4 umol/L  TSH     Status: None   Collection Time    03/20/13  4:58 AM      Result Value Range   TSH 2.020  0.350 - 4.500 uIU/mL  HEMOGLOBIN A1C     Status: None   Collection Time    03/20/13  4:58 AM      Result Value Range   Hemoglobin A1C 5.5  <5.7 %   Comment: (NOTE)                                                                               According to the ADA Clinical Practice Recommendations for 2011, when     HbA1c is used as a screening test:      >=6.5%   Diagnostic of Diabetes Mellitus               (if abnormal result is confirmed)  5.7-6.4%   Increased risk of developing Diabetes Mellitus     References:Diagnosis and Classification of Diabetes Mellitus,Diabetes     Care,2011,34(Suppl 1):S62-S69 and Standards of Medical Care in             Diabetes - 2011,Diabetes Care,2011,34 (Suppl 1):S11-S61.   Mean Plasma Glucose 111  <117 mg/dL    Dg Chest 2 View  1/91/4782   *RADIOLOGY REPORT*  Clinical Data: Nausea and vomiting.  CHEST - 2 VIEW  Comparison: One-view chest 12/24/2007  Findings: The heart size is normal.  Mild interstitial coarsening is present.  No focal airspace disease is evident.  The visualized soft tissues and bony thorax are unremarkable.  IMPRESSION:  1.  No acute cardiopulmonary disease or significant interval change.   Original Report Authenticated By: Marin Roberts, M.D.   Ct Head Wo Contrast  03/19/2013   *RADIOLOGY REPORT*  Clinical Data: Left-sided weakness for 2 months.  CT HEAD WITHOUT CONTRAST  Technique:  Contiguous axial images were obtained from the base of the skull through the vertex without contrast.  Comparison: MRI brain 09/03/2008.  CT head  09/20/2005  Findings: There is a focal area of encephalomalacia in the left occipital region.  This is new since the previous CT but appears to been present on the prior MRI study.  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  Mild diffuse cerebral atrophy.  No mass effect or midline shift.  No abnormal extra-axial fluid collections.  Wallace Cullens- white matter junctions are distinct.  Basal cisterns are not effaced.  No evidence of acute intracranial hemorrhage.  No depressed skull fractures.  Visualized paranasal sinuses and mastoid air cells are not opacified.  IMPRESSION: Probable old ischemic focus in the left occipital region.  Mild atrophy.  No acute intracranial abnormalities are demonstrated.   Original Report Authenticated By: Burman Nieves, M.D.   Mr Brain Wo Contrast  03/20/2013   s*RADIOLOGY REPORT*  Clinical Data: 62 year old male with left facial droop.  Left side weakness.  MRI HEAD WITHOUT CONTRAST  Technique:  Multiplanar, multiecho pulse sequences of the brain and surrounding structures were obtained according to standard protocol without intravenous contrast.  Comparison: Brain MRI 09/03/2008 and earlier.  Findings: Mild generalized cerebral volume loss since 2010. Progressed left occipital lobe encephalomalacia, but present since 2007.  No restricted diffusion to suggest acute infarction.  No midline shift, mass effect, evidence of mass lesion, ventriculomegaly, extra-axial collection or acute intracranial hemorrhage. Cervicomedullary junction and pituitary are within normal limits. Negative visualized cervical spine.  Major intracranial vascular flow voids are stable.  Mildly progressed nonspecific scattered cerebral white matter T2 and FLAIR hyperintensity since 2010.  No new areas of cortical encephalomalacia.  Deep gray matter nuclei, brainstem and cerebellum remain within normal limits.  Visualized orbit soft tissues are within normal limits.  Mild posterior ethmoid and right maxillary sinus  mucosal thickening. Other Visualized paranasal sinuses and mastoids are clear.  Normal bone marrow signal.  Negative scalp soft tissues.  IMPRESSION: 1. No acute intracranial abnormality. 2.  Mild progression of left occipital lobe encephalomalacia and superimposed generalized cerebral volume loss since 2007.  Mild progression of nonspecific cerebral white matter signal changes.   Original Report Authenticated By: Erskine Speed, M.D.   Mr Lumbar Spine Wo Contrast  03/20/2013   *RADIOLOGY REPORT*  Clinical Data: 62 year old male with low back pain radiating to the left lower extremity with weakness.  MRI LUMBAR SPINE WITHOUT CONTRAST  Technique:  Multiplanar and multiecho pulse sequences of the  lumbar spine were obtained without intravenous contrast.  Comparison: CT abdomen and pelvis 06/26/2011 and earlier.  Findings: Transitional lumbosacral anatomy.  For the purposes of this report, hypoplastic ribs will be designated at T12.  This results in a sacralized L5 level, but there is a nearly full size L5-S1 disc space. Correlation with radiographs is recommended prior to any operative intervention.  Stable vertebral height and alignment.  Mild degenerative marrow edema posteriorly at the L4-L5 endplates.  See additional findings below. No acute osseous abnormality identified.   Visualized lower thoracic spinal cord is normal with conus medularis at T12.  Stable visualized abdominal viscera.  T11-T12:  Negative.  T12-L1:  Negative.  L1-L2:  Negative disc.  Mild to moderate facet hypertrophy.  No stenosis.  L2-L3:  Negative disc.  Mild facet hypertrophy.  No stenosis.  L3-L4:  Mild circumferential disc bulge.  Mild to moderate facet hypertrophy.  No significant stenosis.  L4-L5:  Disc space loss.  Vacuum disc phenomena at this level on the prior probably persist.  Circumferential disc bulge with superimposed central to right paracentral caudal disc protrusion (series 6 image 35).  Mild facet hypertrophy.  No significant  spinal stenosis, but there is mild bilateral lateral recess stenosis and disc material is in proximity to the descending L5 and S1 nerve roots.  No significant L4 foraminal stenosis.  L5-S1:  Sacralized L5 level but nearly full size disc space. Negative and no stenosis.  IMPRESSION: 1.  Transitional anatomy. 2.  Chronic L4-L5 disc degeneration.  Small central disc herniation which might be a source of L5 and/or S1 radiculitis. No spinal stenosis. 3.  No other significant disc degeneration.  Multilevel lumbar facet hypertrophy.   Original Report Authenticated By: Erskine Speed, M.D.        Pam Vanalstine A. Gerilyn Pilgrim, M.D.  Diplomate, Biomedical engineer of Psychiatry and Neurology ( Neurology). 03/21/2013, 8:40 AM

## 2013-03-21 NOTE — Progress Notes (Signed)
INITIAL NUTRITION ASSESSMENT  DOCUMENTATION CODES Per approved criteria  -Not Applicable   INTERVENTION: Snacks between meals  NUTRITION DIAGNOSIS: Inadequate oral intake related to altered GI funcition as evidenced by alcoholic hepatits.   Goal: Pt to meet >/= 90% of their estimated nutrition needs   Monitor:  Po intake, labs and wt trends  Reason for Assessment: Malnutrition Screen Score = 3  62 y.o. male  Admitting Dx: Left leg pain  ASSESSMENT: Hx of ETOH abuse, Chron's dz. Assessed  By RD 06/28/2011 due to unplanned wt loss of 30# (from 190#-160#). His weight has been stable since and has maintained  Wt 160#. However, suspect malnutrition given his hx of ETOH abuse but unable to diagnose at this time. Pt has decided to leave without being discharged.  Height: Ht Readings from Last 1 Encounters:  03/20/13 5\' 9"  (1.753 m)    Weight: Wt Readings from Last 1 Encounters:  03/21/13 157 lb 10.1 oz (71.5 kg)    Ideal Body Weight: 160# (72.7 kg)  % Ideal Body Weight: 98%  Wt Readings from Last 10 Encounters:  03/21/13 157 lb 10.1 oz (71.5 kg)  03/13/13 156 lb 6.4 oz (70.943 kg)  07/01/12 160 lb (72.576 kg)  11/07/11 150 lb 3.2 oz (68.13 kg)  10/02/11 157 lb 4.8 oz (71.351 kg)  06/27/11 160 lb 7.9 oz (72.8 kg)  11/23/09 196 lb (88.905 kg)    Usual Body Weight: 160#  % Usual Body Weight: 98%  BMI:  Body mass index is 23.27 kg/(m^2).normal needs  Estimated Nutritional Needs: Kcal: 1800-2160  Protein: 85-95 gr Fluid: 2160 ml/day  Skin: No issues noted  Diet Order: Cardiac  EDUCATION NEEDS: -No education needs identified at this time   Intake/Output Summary (Last 24 hours) at 03/21/13 1029 Last data filed at 03/20/13 2149  Gross per 24 hour  Intake   1713 ml  Output      0 ml  Net   1713 ml    Last BM: 03/20/13  Labs:   Recent Labs Lab 03/19/13 2005  NA 137  K 3.6  CL 98  CO2 24  BUN 11  CREATININE 0.97  CALCIUM 10.0  GLUCOSE 109*     CBG (last 3)  No results found for this basename: GLUCAP,  in the last 72 hours  Scheduled Meds: . aspirin EC  81 mg Oral Daily  . enoxaparin (LOVENOX) injection  30 mg Subcutaneous Q24H  . folic acid  1 mg Oral Daily  . LORazepam  0-4 mg Intravenous Q6H   Followed by  . [START ON 03/22/2013] LORazepam  0-4 mg Intravenous Q12H  . multivitamin with minerals  1 tablet Oral Daily  . pantoprazole  40 mg Oral BID AC  . sodium chloride  3 mL Intravenous Q12H  . thiamine  100 mg Oral Daily   Or  . thiamine  100 mg Intravenous Daily    Continuous Infusions:   Past Medical History  Diagnosis Date  . Crohn disease   . Acid reflux   . Hypertension   . Alcoholic hepatitis   . Arthritis   . Dumping syndrome   . Paraesophageal hernia   . Chronic diarrhea   . Subdural hematoma   . Gastroparesis   . Migraine headache   . Chronic back pain   . TIA (transient ischemic attack)     Past Surgical History  Procedure Laterality Date  . Nissen fundoplication    . Colon resection Right   . Pyloroplasty  2009    Royann Shivers MS,RD,LDN,CSG Office: [phone] Pager: [phone]

## 2013-03-21 NOTE — Evaluation (Signed)
Physical Therapy Evaluation Patient Details Name: Gary Vasquez MRN: [id number] DOB: Jan 28, 1951 Today's Date: 03/21/2013 Time: 2952-8413 PT Time Calculation (min): 23 min  PT Assessment / Plan / Recommendation History of Present Illness   Pt admitted to [facility] with complaint of Lt LE pain and weakness.  Pt able to ambulate but is slightly unsteady.    Clinical Impression  Pt gt trained with both walker and a cane.  Therapist feels a cane will be more beneficial given the pt age and activity level.  PT was instructed in strengthening exercises and given a HEP to complete at home.  No further skilled therapy is needed at this time.  Pt refuses outpatient as he states his insurance does not cover outpatient therapy.    PT Assessment  Patent does not need any further PT services    Follow Up Recommendations  No PT follow up    Does the patient have the potential to tolerate intense rehabilitation    no  Barriers to Discharge  none      Equipment Recommendations  Cane    Recommendations for Other Services   none  Frequency   N/A   Precautions / Restrictions Precautions Precautions: None Restrictions Weight Bearing Restrictions: No   Pertinent Vitals/Pain 6/10      Mobility  Bed Mobility Bed Mobility: Supine to Sit;Sit to Supine Supine to Sit: 7: Independent Sit to Supine: 7: Independent Transfers Transfers: Sit to Stand Sit to Stand: 7: Independent Ambulation/Gait Ambulation/Gait Assistance: 6: Modified independent (Device/Increase time) Ambulation Distance (Feet): 100 Feet Assistive device: Straight cane Gait velocity: normal after verbal cuing    Exercises Other Exercises Other Exercises: ankle ROM and isometric exercises    PT Diagnosis:    PT Problem List:   PT Treatment Interventions:       PT Goals(Current goals can be found in the care plan section)    Visit Information  Last PT Received On: 03/21/13       Prior [place] expects to be discharged to:: Private residence Living Arrangements: Children Prior Function Level of Independence: Independent Communication Communication: No difficulties    Cognition  Cognition Arousal/Alertness: Awake/alert Behavior During Therapy: WFL for tasks assessed/performed Overall Cognitive Status: Within Functional Limits for tasks assessed    Extremity/Trunk Assessment Lower Extremity Assessment LLE Deficits / Details: Pt ankle dorsiflexion /plantarflexion and eversion mm strength is 2/5; inversin 3/5.     Balance    End of Session PT - End of Session Equipment Utilized During Treatment: Gait belt Activity Tolerance: Patient tolerated treatment well Patient left: in chair  GP     RUSSELL,CINDY 03/21/2013, 10:57 AM

## 2013-03-21 NOTE — Discharge Summary (Signed)
Pt took shower and got self dressed then demanded to go.  I explained that we didn't have official orders yet, but I would contact the Dr.  Rock Nephew was unwilling to wait and proceeded to walk out of the building.  Pt was stopped in the hall and IV was removed.  Dr. Was contacted of him leaving and wife remained in room without following pt from room. It was explained to pt the risk of placing himself in further danger of injuries by leaving without a order from the Dr.  Further infections or even a fall could occur without proper follow ups or medications.  Pt agreed to accept risks. Unfortunately, AMA form was not filled out.

## 2013-03-21 NOTE — Plan of Care (Signed)
Discussed with pt the necessity to stop drinking and seek help for not drinking alcohol per wife's request.  Pt was calm but clear about not agreeing with education.

## 2013-03-21 NOTE — Progress Notes (Signed)
Occupational Therapy Screen  OT orders received. Patient's chart reviewed. Patient is currently functioning at baseline (Independent level). PT has recommended use of a cane for balance. Patient does not voice any need for OT services. No further acute OT needs at this time; will sign off.   Limmie Patricia, OTR/L,CBIS  03/21/13 11:44AM

## 2013-03-21 NOTE — Discharge Summary (Signed)
Physician Discharge Summary  Gary Vasquez QIO:[id number] DOB: Jan 18, 1951 DOA: 03/19/2013  PCP: Default, Provider, MD  Admit date: 03/19/2013 Discharge date: 03/21/2013  Patient left AMA  Recommendations for Outpatient Follow-up:  1. Followup peripheral neuropathy, outpatient EMG studies 2. Continue to encourage abstinence from alcohol 3. Followup chronic back pain 4. 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 Alcoholic hepatitis, alcohol abuse, alcohol intoxication  Discharge Condition: Cannot assess Disposition: Left AMA  Diet recommendation: Regular  Filed Weights   03/19/13 1850 03/20/13 1420 03/21/13 0649  Weight: 72.576 kg (160 lb) 68.947 kg (152 lb) 71.5 kg (157 lb 10.1 oz)    History of present illness:  62 year old man with history of alcohol abuse presented to the emergency department intoxicated, complaining of difficulty walking secondary to left leg pain and weakness progressive over the last 1-2 months. Exam in the emergency department revealed left-sided weakness, left facial droop and admission was recommended. While waiting for admission patient's ambulation improved significantly and is able to walk to the restroom without assistance. No speech or swallowing problems were noted. He ate a meal in the emergency department.  Hospital Course:  [name] was admitted for further evaluation. On subsequent evaluation exam was notable for no left upper extremity weakness or facial droop. There was no left proximal lower extremity weakness. Left foot drop was noted but strength overall in the leg with excellent, patient is able to stand without assistance and walk without assistance. He reported the pain in his leg was present for at least one month, no injury but did have a history of previous back pain. Excellent pulse was noted in his left foot, perfusion  appeared normal there is no evidence to suggest arterial insufficiency. Because of the possibility of left upper extremity weakness and facial weakness in the emergency department MRI of the brain was obtained which was negative as was MRI of the lumbar spine which did note some foraminal compression which may result in some radiculopathy. Patient was evaluated by neurology and felt to have a peripheral neuropathy secondary to alcoholism, nutritional deficiency. MRI of the cervical spine was recommended as was thoracic spine x-ray both of which were generally unremarkable. EMG of the legs recommended in the outpatient settings. Other laboratory studies were also recommended to assess for other causes of neuropathy as was AFO. Physical therapy evaluated the patient, he had no needs for outpatient therapy.  Alcohol intoxication resolved and there was no evidence of withdrawal.   After the completion of testing but before I was able to assess him the patient suddenly left AMA.  Discharge Instructions   Future Appointments Provider Department Dept Phone   06/12/2013 9:30 AM Butch Penny, NP [facility] [phone]       Medication List    ASK your doctor about these medications       ALPRAZolam 0.5 MG 24 hr tablet  Commonly known as:  XANAX XR  Take 1 tablet (0.5 mg total) by mouth every morning.     cetirizine 10 MG tablet  Commonly known as:  ZYRTEC  Take 10 mg by mouth daily.     ibuprofen 200 MG tablet  Commonly known as:  ADVIL,MOTRIN  Take 200 mg by mouth every 6 (six) hours as needed. inflammation/pain     multivitamin with minerals Tabs  Take 1 tablet by mouth daily.  ranitidine 150 MG tablet  Commonly known as:  ZANTAC  Take 1 tablet (150 mg total) by mouth 2 (two) times daily.       No Known Allergies  The results of significant diagnostics from this hospitalization (including imaging, microbiology, ancillary and laboratory) are listed below  for reference.    Significant Diagnostic Studies: Dg Chest 2 View  03/19/2013   *RADIOLOGY REPORT*  Clinical Data: Nausea and vomiting.  CHEST - 2 VIEW  Comparison: One-view chest 12/24/2007  Findings: The heart size is normal.  Mild interstitial coarsening is present.  No focal airspace disease is evident.  The visualized soft tissues and bony thorax are unremarkable.  IMPRESSION:  1.  No acute cardiopulmonary disease or significant interval change.   Original Report Authenticated By: San Morelle, M.D.   Dg Thoracic Spine W/swimmers  03/21/2013   *RADIOLOGY REPORT*  Clinical Data: Back pain  THORACIC SPINE - 2 VIEW + SWIMMERS  Comparison: March 19, 2013.  Findings: No fracture or spondylolisthesis is noted.  No significant degenerative changes are noted.  Disc spaces are well maintained.  IMPRESSION: Normal thoracic spine.   Original Report Authenticated By: Marijo Conception.,  M.D.   Ct Head Wo Contrast  03/19/2013   *RADIOLOGY REPORT*  Clinical Data: Left-sided weakness for 2 months.  CT HEAD WITHOUT CONTRAST  Technique:  Contiguous axial images were obtained from the base of the skull through the vertex without contrast.  Comparison: MRI brain 09/03/2008.  CT head 09/20/2005  Findings: There is a focal area of encephalomalacia in the left occipital region.  This is new since the previous CT but appears to been present on the prior MRI study.  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  Mild diffuse cerebral atrophy.  No mass effect or midline shift.  No abnormal extra-axial fluid collections.  Pearline Cables- white matter junctions are distinct.  Basal cisterns are not effaced.  No evidence of acute intracranial hemorrhage.  No depressed skull fractures.  Visualized paranasal sinuses and mastoid air cells are not opacified.  IMPRESSION: Probable old ischemic focus in the left occipital region.  Mild atrophy.  No acute intracranial abnormalities are demonstrated.   Original Report Authenticated By: Lucienne Capers, M.D.   Mr Brain Wo Contrast  03/20/2013   s*RADIOLOGY REPORT*  Clinical Data: 62 year old male with left facial droop.  Left side weakness.  MRI HEAD WITHOUT CONTRAST  Technique:  Multiplanar, multiecho pulse sequences of the brain and surrounding structures were obtained according to standard protocol without intravenous contrast.  Comparison: Brain MRI 09/03/2008 and earlier.  Findings: Mild generalized cerebral volume loss since 2010. Progressed left occipital lobe encephalomalacia, but present since 2007.  No restricted diffusion to suggest acute infarction.  No midline shift, mass effect, evidence of mass lesion, ventriculomegaly, extra-axial collection or acute intracranial hemorrhage. Cervicomedullary junction and pituitary are within normal limits. Negative visualized cervical spine.  Major intracranial vascular flow voids are stable.  Mildly progressed nonspecific scattered cerebral white matter T2 and FLAIR hyperintensity since 2010.  No new areas of cortical encephalomalacia.  Deep gray matter nuclei, brainstem and cerebellum remain within normal limits.  Visualized orbit soft tissues are within normal limits.  Mild posterior ethmoid and right maxillary sinus mucosal thickening. Other Visualized paranasal sinuses and mastoids are clear.  Normal bone marrow signal.  Negative scalp soft tissues.  IMPRESSION: 1. No acute intracranial abnormality. 2.  Mild progression of left occipital lobe encephalomalacia and superimposed generalized cerebral volume loss since 2007.  Mild  progression of nonspecific cerebral white matter signal changes.   Original Report Authenticated By: Roselyn Reef, M.D.   Mr Cervical Spine Wo Contrast  03/21/2013   *RADIOLOGY REPORT*  Clinical Data: Left arm and leg weakness.  MRI CERVICAL SPINE WITHOUT CONTRAST  Technique:  Multiplanar and multiecho pulse sequences of the cervical spine, to include the craniocervical junction and cervicothoracic junction, were obtained  according to standard protocol without intravenous contrast.  Comparison: None.  Findings: The visualized intracranial contents and paraspinal soft tissues are normal.  Cervical spinal cord is normal with no mass lesion or myelopathy or spinal cord compression.  No significant facet joint arthritis of the cervical spine.  C1-2 and C2-3 and C3-4:  Normal.  C4-5:  Tiny central disc bulging osteophytes asymmetric to the right with no neural impingement.  No foraminal stenosis.  C5-6:  Small broad-based disc protrusion with accompanying osteophytes creating right foraminal stenosis and slight left foraminal stenosis.  The spinal cord is not compressed.  C6-7:  Degenerative disc disease with small broad-based disc bulge with accompanying osteophytes with no neural impingement.  No foraminal stenosis.  C7-T1:  Tiny central disc bulge with no neural impingement.  T1-2:  Normal.  IMPRESSION:  1.  Multilevel degenerative disc disease. 2. Right foraminal stenosis at C5-6 and to a lesser degree on the left. 3.  Cervical spinal cord appears normal.   Original Report Authenticated By: Lorriane Shire, M.D.   Mr Lumbar Spine Wo Contrast  03/20/2013   *RADIOLOGY REPORT*  Clinical Data: 62 year old male with low back pain radiating to the left lower extremity with weakness.  MRI LUMBAR SPINE WITHOUT CONTRAST  Technique:  Multiplanar and multiecho pulse sequences of the lumbar spine were obtained without intravenous contrast.  Comparison: CT abdomen and pelvis 06/26/2011 and earlier.  Findings: Transitional lumbosacral anatomy.  For the purposes of this report, hypoplastic ribs will be designated at T12.  This results in a sacralized L5 level, but there is a nearly full size L5-S1 disc space. Correlation with radiographs is recommended prior to any operative intervention.  Stable vertebral height and alignment.  Mild degenerative marrow edema posteriorly at the L4-L5 endplates.  See additional findings below. No acute osseous  abnormality identified.   Visualized lower thoracic spinal cord is normal with conus medularis at T12.  Stable visualized abdominal viscera.  T11-T12:  Negative.  T12-L1:  Negative.  L1-L2:  Negative disc.  Mild to moderate facet hypertrophy.  No stenosis.  L2-L3:  Negative disc.  Mild facet hypertrophy.  No stenosis.  L3-L4:  Mild circumferential disc bulge.  Mild to moderate facet hypertrophy.  No significant stenosis.  L4-L5:  Disc space loss.  Vacuum disc phenomena at this level on the prior probably persist.  Circumferential disc bulge with superimposed central to right paracentral caudal disc protrusion (series 6 image 35).  Mild facet hypertrophy.  No significant spinal stenosis, but there is mild bilateral lateral recess stenosis and disc material is in proximity to the descending L5 and S1 nerve roots.  No significant L4 foraminal stenosis.  L5-S1:  Sacralized L5 level but nearly full size disc space. Negative and no stenosis.  IMPRESSION: 1.  Transitional anatomy. 2.  Chronic L4-L5 disc degeneration.  Small central disc herniation which might be a source of L5 and/or S1 radiculitis. No spinal stenosis. 3.  No other significant disc degeneration.  Multilevel lumbar facet hypertrophy.   Original Report Authenticated By: Roselyn Reef, M.D.   US Abdomen Complete  03/18/2013   *  RADIOLOGY REPORT*  Clinical Data:  Abdominal pain.  Nausea and vomiting.  COMPLETE ABDOMINAL ULTRASOUND  Comparison:  None.  Findings:  Gallbladder:  A single stone is identified in the neck of the gallbladder measuring 0.8 cm.  No wall thickening or pericholecystic fluid is identified.  Sonographer reports negative Murphy's sign.  Common bile duct:  Measures 0.8 cm.  Liver:  Demonstrates increased echogenicity consistent with fatty infiltration.  No focal lesion or intrahepatic biliary ductal dilatation.  IVC:  Appears normal.  Pancreas:  No focal abnormality seen.  Spleen:  Measures 5.6 cm and appears normal.  Right Kidney:  Knee  measures 11.7 cm and appears normal.  Left Kidney:  Measures 11.4 cm and appears normal.  Abdominal aorta:  No aneurysm identified.  IMPRESSION:  1.  Single small gallstone without evidence of cholecystitis. 2.  Fatty infiltration of the liver.   Original Report Authenticated By: Orlean Patten, M.D.    Labs: Basic Metabolic Panel:  Recent Labs Lab 03/19/13 2005  NA 137  K 3.6  CL 98  CO2 24  GLUCOSE 109*  BUN 11  CREATININE 0.97  CALCIUM 10.0   Liver Function Tests:  Recent Labs Lab 03/19/13 2005  AST 114*  ALT 136*  ALKPHOS 88  BILITOT 0.8  PROT 7.8  ALBUMIN 4.1    Recent Labs Lab 03/19/13 2006  AMMONIA 32   CBC:  Recent Labs Lab 03/19/13 2000  WBC 4.7  NEUTROABS 2.0  HGB 14.3  HCT 40.7  MCV 91.3  PLT 122*   Cardiac Enzymes:  Recent Labs Lab 03/19/13 2000  TROPONINI <0.30    Principal Problem:   Left leg pain Active Problems:   CROHN'S DISEASE, HX OF   Alcohol abuse   Left hemiplegia   Time coordinating discharge: 25 minutes  Signed:  Murray Hodgkins, MD [facility] 03/21/2013, 5:04 PM

## 2013-03-24 LAB — ANA: Anti Nuclear Antibody(ANA): NEGATIVE

## 2013-03-25 NOTE — Progress Notes (Signed)
UR chart review completed.  

## 2013-04-15 ENCOUNTER — Emergency Department (HOSPITAL_COMMUNITY)
Admission: EM | Admit: 2013-04-15 | Discharge: 2013-04-15 | Disposition: A | Discharge status: Home / Self Care | Payer: MEDICAID | Attending: Emergency Medicine | Admitting: Emergency Medicine

## 2013-04-15 ENCOUNTER — Encounter (HOSPITAL_COMMUNITY): Payer: Self-pay | Admitting: *Deleted

## 2013-04-15 DIAGNOSIS — R58 Hemorrhage, not elsewhere classified: Secondary | ICD-10-CM | POA: Insufficient documentation

## 2013-04-15 DIAGNOSIS — J3489 Other specified disorders of nose and nasal sinuses: Secondary | ICD-10-CM | POA: Insufficient documentation

## 2013-04-15 DIAGNOSIS — R079 Chest pain, unspecified: Secondary | ICD-10-CM | POA: Insufficient documentation

## 2013-04-15 DIAGNOSIS — Z8673 Personal history of transient ischemic attack (TIA), and cerebral infarction without residual deficits: Secondary | ICD-10-CM | POA: Insufficient documentation

## 2013-04-15 DIAGNOSIS — Z79899 Other long term (current) drug therapy: Secondary | ICD-10-CM | POA: Insufficient documentation

## 2013-04-15 DIAGNOSIS — K219 Gastro-esophageal reflux disease without esophagitis: Secondary | ICD-10-CM | POA: Insufficient documentation

## 2013-04-15 DIAGNOSIS — F419 Anxiety disorder, unspecified: Secondary | ICD-10-CM

## 2013-04-15 DIAGNOSIS — Z8739 Personal history of other diseases of the musculoskeletal system and connective tissue: Secondary | ICD-10-CM | POA: Insufficient documentation

## 2013-04-15 DIAGNOSIS — R110 Nausea: Secondary | ICD-10-CM | POA: Insufficient documentation

## 2013-04-15 DIAGNOSIS — R0602 Shortness of breath: Secondary | ICD-10-CM | POA: Insufficient documentation

## 2013-04-15 DIAGNOSIS — G8929 Other chronic pain: Secondary | ICD-10-CM | POA: Insufficient documentation

## 2013-04-15 DIAGNOSIS — R259 Unspecified abnormal involuntary movements: Secondary | ICD-10-CM | POA: Insufficient documentation

## 2013-04-15 DIAGNOSIS — F17200 Nicotine dependence, unspecified, uncomplicated: Secondary | ICD-10-CM | POA: Insufficient documentation

## 2013-04-15 DIAGNOSIS — Z8719 Personal history of other diseases of the digestive system: Secondary | ICD-10-CM | POA: Insufficient documentation

## 2013-04-15 DIAGNOSIS — F39 Unspecified mood [affective] disorder: Secondary | ICD-10-CM

## 2013-04-15 DIAGNOSIS — F411 Generalized anxiety disorder: Secondary | ICD-10-CM | POA: Insufficient documentation

## 2013-04-15 DIAGNOSIS — IMO0001 Reserved for inherently not codable concepts without codable children: Secondary | ICD-10-CM | POA: Insufficient documentation

## 2013-04-15 DIAGNOSIS — Z8679 Personal history of other diseases of the circulatory system: Secondary | ICD-10-CM | POA: Insufficient documentation

## 2013-04-15 DIAGNOSIS — F1010 Alcohol abuse, uncomplicated: Secondary | ICD-10-CM

## 2013-04-15 DIAGNOSIS — I10 Essential (primary) hypertension: Secondary | ICD-10-CM | POA: Insufficient documentation

## 2013-04-15 DIAGNOSIS — F329 Major depressive disorder, single episode, unspecified: Secondary | ICD-10-CM

## 2013-04-15 LAB — CBC WITH DIFFERENTIAL/PLATELET
Basophils Absolute: 0 10*3/uL (ref 0.0–0.1)
Basophils Relative: 1 % (ref 0–1)
HCT: 42.5 % (ref 39.0–52.0)
Hemoglobin: 14.8 g/dL (ref 13.0–17.0)
Lymphocytes Relative: 23 % (ref 12–46)
MCHC: 34.8 g/dL (ref 30.0–36.0)
Monocytes Relative: 9 % (ref 3–12)
Neutro Abs: 4.8 10*3/uL (ref 1.7–7.7)
Neutrophils Relative %: 67 % (ref 43–77)
WBC: 7.3 10*3/uL (ref 4.0–10.5)

## 2013-04-15 LAB — COMPREHENSIVE METABOLIC PANEL
AST: 63 U/L — ABNORMAL HIGH (ref 0–37)
Albumin: 4.4 g/dL (ref 3.5–5.2)
Alkaline Phosphatase: 99 U/L (ref 39–117)
BUN: 14 mg/dL (ref 6–23)
CO2: 26 mEq/L (ref 19–32)
Chloride: 96 mEq/L (ref 96–112)
GFR calc non Af Amer: 80 mL/min — ABNORMAL LOW (ref >90)
Potassium: 4.3 mEq/L (ref 3.5–5.1)
Total Bilirubin: 1.4 mg/dL — ABNORMAL HIGH (ref 0.3–1.2)

## 2013-04-15 LAB — RAPID URINE DRUG SCREEN, HOSP PERFORMED
Barbiturates: NOT DETECTED
Tetrahydrocannabinol: NOT DETECTED

## 2013-04-15 LAB — LIPASE, BLOOD: Lipase: 25 U/L (ref 11–59)

## 2013-04-15 MED ORDER — TRAZODONE HCL 50 MG PO TABS: 50.0000 mg | ORAL_TABLET | Freq: Every day | ORAL | Status: DC

## 2013-04-15 MED ORDER — HYDROCODONE-ACETAMINOPHEN 5-325 MG PO TABS: 1.0000 | ORAL_TABLET | Freq: Once | ORAL | Status: AC

## 2013-04-15 MED ORDER — HYDROCODONE-ACETAMINOPHEN 5-325 MG PO TABS
ORAL_TABLET | ORAL | Status: AC
  Administered 2013-04-15: 1 via ORAL
  Filled 2013-04-15: qty 1

## 2013-04-15 MED ORDER — ADULT MULTIVITAMIN W/MINERALS CH
1.0000 | ORAL_TABLET | Freq: Every day | ORAL | Status: DC
  Administered 2013-04-15: 1 via ORAL
  Filled 2013-04-15: qty 1

## 2013-04-15 MED ORDER — THIAMINE HCL 100 MG/ML IJ SOLN: 100.0000 mg | Freq: Every day | INTRAMUSCULAR | Status: DC

## 2013-04-15 MED ORDER — SODIUM CHLORIDE 0.9 % IV SOLN: INTRAVENOUS | Status: DC

## 2013-04-15 MED ORDER — LORAZEPAM 1 MG PO TABS
ORAL_TABLET | ORAL | Status: AC
  Administered 2013-04-15: 1 mg via ORAL
  Filled 2013-04-15: qty 1

## 2013-04-15 MED ORDER — ONDANSETRON HCL 4 MG/2ML IJ SOLN
4.0000 mg | Freq: Once | INTRAMUSCULAR | Status: DC
  Filled 2013-04-15: qty 2

## 2013-04-15 MED ORDER — ONDANSETRON HCL 4 MG PO TABS: 4.0000 mg | ORAL_TABLET | Freq: Three times a day (TID) | ORAL | Status: DC | PRN

## 2013-04-15 MED ORDER — LORAZEPAM 1 MG PO TABS
1.0000 mg | ORAL_TABLET | Freq: Once | ORAL | Status: AC
  Administered 2013-04-15: 1 mg via ORAL
  Filled 2013-04-15: qty 1

## 2013-04-15 MED ORDER — LORAZEPAM 2 MG/ML IJ SOLN
1.0000 mg | Freq: Once | INTRAMUSCULAR | Status: DC
  Filled 2013-04-15: qty 1

## 2013-04-15 MED ORDER — IBUPROFEN 400 MG PO TABS
600.0000 mg | ORAL_TABLET | Freq: Three times a day (TID) | ORAL | Status: DC | PRN
  Administered 2013-04-15: 600 mg via ORAL
  Filled 2013-04-15: qty 2

## 2013-04-15 MED ORDER — LORAZEPAM 1 MG PO TABS: 1.0000 mg | ORAL_TABLET | Freq: Three times a day (TID) | ORAL | Status: DC | PRN

## 2013-04-15 MED ORDER — ONDANSETRON 4 MG PO TBDP
ORAL_TABLET | ORAL | Status: AC
Start: 2013-04-15 — End: 2013-04-15
  Administered 2013-04-15: 4 mg via ORAL
  Filled 2013-04-15: qty 1

## 2013-04-15 MED ORDER — FOLIC ACID 1 MG PO TABS
1.0000 mg | ORAL_TABLET | Freq: Every day | ORAL | Status: DC
  Administered 2013-04-15: 1 mg via ORAL
  Filled 2013-04-15: qty 1

## 2013-04-15 MED ORDER — CHLORDIAZEPOXIDE HCL 25 MG PO CAPS: 25.0000 mg | ORAL_CAPSULE | Freq: Four times a day (QID) | ORAL | Status: DC | PRN

## 2013-04-15 MED ORDER — LORAZEPAM 1 MG PO TABS
1.0000 mg | ORAL_TABLET | Freq: Four times a day (QID) | ORAL | Status: DC | PRN
  Administered 2013-04-15: 1 mg via ORAL
  Filled 2013-04-15: qty 1

## 2013-04-15 MED ORDER — LORAZEPAM 2 MG/ML IJ SOLN: 1.0000 mg | Freq: Four times a day (QID) | INTRAMUSCULAR | Status: DC | PRN

## 2013-04-15 MED ORDER — ONDANSETRON 4 MG PO TBDP: 4.0000 mg | ORAL_TABLET | Freq: Once | ORAL | Status: AC

## 2013-04-15 MED ORDER — VITAMIN B-1 100 MG PO TABS
100.0000 mg | ORAL_TABLET | Freq: Every day | ORAL | Status: DC
  Administered 2013-04-15: 100 mg via ORAL
  Filled 2013-04-15: qty 1

## 2013-04-15 MED ORDER — LORAZEPAM 1 MG PO TABS: 1.0000 mg | ORAL_TABLET | Freq: Once | ORAL | Status: AC

## 2013-04-15 MED ORDER — SODIUM CHLORIDE 0.9 % IV BOLUS (SEPSIS): 1000.0000 mL | Freq: Once | INTRAVENOUS | Status: DC

## 2013-04-15 NOTE — ED Notes (Signed)
Spoke with Tom from [facility] and pt is to be discharged home, EDP notified.

## 2013-04-15 NOTE — ED Notes (Signed)
Another RN to stick. Site blew. EDP aware , advised to d/c iv and give same meds orally.

## 2013-04-15 NOTE — ED Notes (Signed)
Called [facility] at this time to schedule psych appt x3 had to leave message on machine. lisa
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
## 2013-04-15 NOTE — BH Assessment (Addendum)
[facility] Assessment Progress Note  Donell Sievert, PA, performed tele-psychiatry consult on pt at 19:30.  He reports that at this time pt does not present a life threatening danger to himself or others, and he therefore does not require admission to [facility].  He further reports that pt, who has gone through the intake process at [facility] in [place], has an appointment to see a psychiatrist there on Thursday, 04/17/2013 at 13:00.  Karleen Hampshire advises that pt keep that appointment both for his depression problems, as well as to discuss outpatient alcohol rehabilitation services.  Karleen Hampshire also reports that he will be calling in medications that will help pt with any alcohol withdrawal symptoms that he may experience.  At 20:37 I attempted to speak to the pt's nurse to verbally report these recommendations, but she was unable to come to the phone.  At 20:44 Wilkie Aye, the pt's nurse, called back and I discussed these details with her.  Doylene Canning, MA Triage Specialist 04/16/2103 @ 20:44

## 2013-04-15 NOTE — ED Notes (Signed)
Pt presents to er from [facility] with request for detox from alcohol, pt states that he normally drinks a half a fifth of liquor daily, last drink was last night, two beers and one mixed drink, admits to nausea, "shaking" this am, denies any SI/HI at present,

## 2013-04-15 NOTE — ED Notes (Signed)
Pt is speaking with nurse practitioner at behavioral health at this time.

## 2013-04-15 NOTE — ED Notes (Signed)
Meal tray given 

## 2013-04-15 NOTE — ED Notes (Signed)
Pt states he is not allergic to tylenol just does not take it bc of his liver. Advised edp ordered a hydrocodone with 325mg  tylenol in it, pt states that would be ok and would take it.

## 2013-04-15 NOTE — Progress Notes (Signed)
ED/CM noted patient did not have health insurance and/or PCP listed in the computer.  Patient was given the [place] resource handout with information on the clinics, food pantries, and the handout for new health insurance sign-up.  Patient expressed appreciation for this. 

## 2013-04-15 NOTE — ED Provider Notes (Signed)
CSN: [id number]     Arrival date & time 04/15/13  9169 History    This chart was scribed for Mervin Kung, MD,  by Stacy Gardner, ED Scribe. The patient was seen in room APA03/APA03 and the patient's care was started at 10:05 AM    Chief Complaint  Patient presents with  . V70.1   (Consider location/radiation/quality/duration/timing/severity/associated sxs/prior Treatment) HPI HPI Comments: Gary Vasquez is a 62 y.o. male who presents to the Emergency Department from [facility] with request for detox from alcohol. He reports that he last had a drink last night (2 beers and one mixed drink) and typically drinks fifth of liquor a day. He reports having nausea and tremors upon waking. Also he states having moderate pain in his left leg. He is experiencing anxiety with onset of arriving to the ED.  Pt denies having delirium tremors. He mentions that he is experiencing  . Pt denies diarrhea, vomiting, diaphoresis.  He also denies SI and HI. Pt reports 7-8 yeas ago was his last time undergoing detox from alcohol.  Pt does not have a current PCP Past Medical History  Diagnosis Date  . Crohn disease   . Acid reflux   . Hypertension   . Alcoholic hepatitis   . Arthritis   . Dumping syndrome   . Paraesophageal hernia   . Chronic diarrhea   . Subdural hematoma   . Gastroparesis   . Migraine headache   . Chronic back pain   . TIA (transient ischemic attack)    Past Surgical History  Procedure Laterality Date  . Nissen fundoplication    . Colon resection Right   . Pyloroplasty  2009   Family History  Problem Relation Age of Onset  . Healthy Daughter   . Healthy Son    History  Substance Use Topics  . Smoking status: Current Some Day Smoker -- 0.50 packs/day    Types: Cigarettes  . Smokeless tobacco: Never Used     Comment: 2-3 cigarrettes a day  . Alcohol Use: Yes     Comment: He is drinking 3-4 drinks a day.     Review of Systems  Constitutional: Negative for fever  and chills.  HENT: Positive for congestion and rhinorrhea.   Respiratory: Positive for shortness of breath. Negative for cough.   Cardiovascular: Positive for chest pain.  Gastrointestinal: Negative for nausea, vomiting, abdominal pain and diarrhea.  Genitourinary: Negative for dysuria.  Musculoskeletal: Positive for myalgias (left calf ).  Skin: Negative for pallor.  Neurological: Negative for headaches.  Hematological: Bruises/bleeds easily.  Psychiatric/Behavioral: Negative for confusion.  All other systems reviewed and are negative.    Allergies  Acetaminophen  Home Medications   Current Outpatient Rx  Name  Route  Sig  Dispense  Refill  . ibuprofen (ADVIL,MOTRIN) 200 MG tablet   Oral   Take 200 mg by mouth every 6 (six) hours as needed. inflammation/pain          . Multiple Vitamin (MULTIVITAMIN WITH MINERALS) TABS   Oral   Take 1 tablet by mouth daily.         . ranitidine (ZANTAC) 150 MG tablet   Oral   Take 1 tablet (150 mg total) by mouth 2 (two) times daily.   60 tablet   5   . ALPRAZolam (XANAX XR) 0.5 MG 24 hr tablet   Oral   Take 1 tablet (0.5 mg total) by mouth every morning.   14 tablet   0   .  cetirizine (ZYRTEC) 10 MG tablet   Oral   Take 10 mg by mouth daily.            BP 160/95  Pulse 62  Temp(Src) 97.8 F (36.6 C) (Oral)  Resp 20  Ht 6' (1.829 m)  Wt 160 lb (72.576 kg)  BMI 21.7 kg/m2  SpO2 100% Physical Exam  Nursing note and vitals reviewed. Constitutional: He is oriented to person, place, and time. He appears well-developed and well-nourished. No distress.  HENT:  Head: Normocephalic and atraumatic.  Mouth/Throat: Oropharynx is clear and moist.  Eyes: Conjunctivae are normal. Pupils are equal, round, and reactive to light. No scleral icterus.  Neck: Normal range of motion. Neck supple.  Cardiovascular: Normal rate, regular rhythm, normal heart sounds and intact distal pulses.   No murmur heard. Pulmonary/Chest: Effort  normal and breath sounds normal. No stridor. No respiratory distress. He has no wheezes. He has no rales.  Abdominal: Soft. Bowel sounds are normal. He exhibits no distension. There is no tenderness. There is no rebound and no guarding.  Musculoskeletal: Normal range of motion. He exhibits no edema.  Neurological: He is alert and oriented to person, place, and time. No cranial nerve deficit. He exhibits normal muscle tone. Coordination normal.  Skin: Skin is warm and dry. No rash noted.  Psychiatric: He has a normal mood and affect. His behavior is normal.    ED Course  DIAGNOSTIC STUDIES: Oxygen Saturation is 100% on room air, normal by my interpretation.    COORDINATION OF CARE: 10:09 AM Discussed course of care with pt . Pt understands and agrees.     Procedures (including critical care time)  Labs Reviewed  CBC WITH DIFFERENTIAL - Abnormal; Notable for the following:    Platelets 139 (*)    All other components within normal limits  COMPREHENSIVE METABOLIC PANEL - Abnormal; Notable for the following:    Glucose, Bld 102 (*)    AST 63 (*)    ALT 66 (*)    Total Bilirubin 1.4 (*)    GFR calc non Af Amer 80 (*)    All other components within normal limits  URINE RAPID DRUG SCREEN (HOSP PERFORMED)  ETHANOL  LIPASE, BLOOD     Date: 04/15/2013  Rate: 62  Rhythm: normal sinus rhythm  QRS Axis: normal  Intervals: normal  ST/T Wave abnormalities: early repolarization  Conduction Disutrbances:none  Narrative Interpretation:   Old EKG Reviewed: unchanged No sniffing change in EKG compared to July 23 24 to  Results for orders placed during the hospital encounter of 04/15/13  CBC WITH DIFFERENTIAL      Result Value Range   WBC 7.3  4.0 - 10.5 K/uL   RBC 4.47  4.22 - 5.81 MIL/uL   Hemoglobin 14.8  13.0 - 17.0 g/dL   HCT 42.5  39.0 - 52.0 %   MCV 95.1  78.0 - 100.0 fL   MCH 33.1  26.0 - 34.0 pg   MCHC 34.8  30.0 - 36.0 g/dL   RDW 14.8  11.5 - 15.5 %   Platelets 139 (*)  150 - 400 K/uL   Neutrophils Relative % 67  43 - 77 %   Neutro Abs 4.8  1.7 - 7.7 K/uL   Lymphocytes Relative 23  12 - 46 %   Lymphs Abs 1.7  0.7 - 4.0 K/uL   Monocytes Relative 9  3 - 12 %   Monocytes Absolute 0.7  0.1 - 1.0 K/uL   Eosinophils  Relative 0  0 - 5 %   Eosinophils Absolute 0.0  0.0 - 0.7 K/uL   Basophils Relative 1  0 - 1 %   Basophils Absolute 0.0  0.0 - 0.1 K/uL  COMPREHENSIVE METABOLIC PANEL      Result Value Range   Sodium 135  135 - 145 mEq/L   Potassium 4.3  3.5 - 5.1 mEq/L   Chloride 96  96 - 112 mEq/L   CO2 26  19 - 32 mEq/L   Glucose, Bld 102 (*) 70 - 99 mg/dL   BUN 14  6 - 23 mg/dL   Creatinine, Ser 1.00  0.50 - 1.35 mg/dL   Calcium 10.1  8.4 - 10.5 mg/dL   Total Protein 8.3  6.0 - 8.3 g/dL   Albumin 4.4  3.5 - 5.2 g/dL   AST 63 (*) 0 - 37 U/L   ALT 66 (*) 0 - 53 U/L   Alkaline Phosphatase 99  39 - 117 U/L   Total Bilirubin 1.4 (*) 0.3 - 1.2 mg/dL   GFR calc non Af Amer 80 (*) >90 mL/min   GFR calc Af Amer >90  >90 mL/min  URINE RAPID DRUG SCREEN (HOSP PERFORMED)      Result Value Range   Opiates NONE DETECTED  NONE DETECTED   Cocaine NONE DETECTED  NONE DETECTED   Benzodiazepines NONE DETECTED  NONE DETECTED   Amphetamines NONE DETECTED  NONE DETECTED   Tetrahydrocannabinol NONE DETECTED  NONE DETECTED   Barbiturates NONE DETECTED  NONE DETECTED  ETHANOL      Result Value Range   Alcohol, Ethyl (B) <11  0 - 11 mg/dL  LIPASE, BLOOD      Result Value Range   Lipase 25  11 - 59 U/L     No results found. 1. Alcohol abuse     MDM  Patient will be a violated by ACT team for detox rehabilitation placement. Patient without any suicidal or homicidal ideations. Patient voluntarily would like to go into detox. Patient here had some tremors Ativan helped significantly. Patient is not in DTs at the moment.  I personally performed the services described in this documentation, which was scribed in my presence. The recorded information has been reviewed  and is accurate.       Mervin Kung, MD 04/15/13 [phone]
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
## 2013-04-15 NOTE — ED Notes (Signed)
Pt wanded by security for safety.

## 2013-04-15 NOTE — ED Provider Notes (Signed)
9:42 PM Psychiatry recommends d/c home. F/u w/ [facility] this Thursday. Will provide Rx's for trazadone and librium per psych recs. I re-examined the pt who appears well and discussed the plan. He understands and will comply.   Discharge Medication List as of 04/15/2013  9:47 PM    START taking these medications   Details  chlordiazePOXIDE (LIBRIUM) 25 MG capsule Take 1 capsule (25 mg total) by mouth 4 (four) times daily as needed for anxiety., Starting 04/15/2013, Until Discontinued, Print    traZODone (DESYREL) 50 MG tablet Take 1 tablet (50 mg total) by mouth at bedtime., Starting 04/15/2013, Until Discontinued, Print       Clinical Impression 1. Alcohol abuse   2. MDD (major depressive disorder)   3. Anxiety disorder       Junius Argyle, MD 04/16/13 1126

## 2013-04-20 ENCOUNTER — Encounter (HOSPITAL_COMMUNITY): Payer: Self-pay

## 2013-04-20 ENCOUNTER — Emergency Department (HOSPITAL_COMMUNITY)
Admission: EM | Admit: 2013-04-20 | Discharge: 2013-04-20 | Disposition: A | Discharge status: Home / Self Care | Payer: Medicaid Other | Attending: Emergency Medicine | Admitting: Emergency Medicine

## 2013-04-20 ENCOUNTER — Emergency Department (HOSPITAL_COMMUNITY): Payer: Medicaid Other

## 2013-04-20 DIAGNOSIS — M129 Arthropathy, unspecified: Secondary | ICD-10-CM | POA: Insufficient documentation

## 2013-04-20 DIAGNOSIS — Z8719 Personal history of other diseases of the digestive system: Secondary | ICD-10-CM | POA: Insufficient documentation

## 2013-04-20 DIAGNOSIS — R209 Unspecified disturbances of skin sensation: Secondary | ICD-10-CM | POA: Insufficient documentation

## 2013-04-20 DIAGNOSIS — Z8679 Personal history of other diseases of the circulatory system: Secondary | ICD-10-CM | POA: Insufficient documentation

## 2013-04-20 DIAGNOSIS — Z8673 Personal history of transient ischemic attack (TIA), and cerebral infarction without residual deficits: Secondary | ICD-10-CM | POA: Insufficient documentation

## 2013-04-20 DIAGNOSIS — F1021 Alcohol dependence, in remission: Secondary | ICD-10-CM | POA: Insufficient documentation

## 2013-04-20 DIAGNOSIS — M79609 Pain in unspecified limb: Secondary | ICD-10-CM | POA: Insufficient documentation

## 2013-04-20 DIAGNOSIS — M216X9 Other acquired deformities of unspecified foot: Secondary | ICD-10-CM | POA: Insufficient documentation

## 2013-04-20 DIAGNOSIS — Z79899 Other long term (current) drug therapy: Secondary | ICD-10-CM | POA: Insufficient documentation

## 2013-04-20 DIAGNOSIS — R197 Diarrhea, unspecified: Secondary | ICD-10-CM | POA: Insufficient documentation

## 2013-04-20 DIAGNOSIS — I10 Essential (primary) hypertension: Secondary | ICD-10-CM | POA: Insufficient documentation

## 2013-04-20 DIAGNOSIS — Z8619 Personal history of other infectious and parasitic diseases: Secondary | ICD-10-CM | POA: Insufficient documentation

## 2013-04-20 DIAGNOSIS — R29818 Other symptoms and signs involving the nervous system: Secondary | ICD-10-CM | POA: Insufficient documentation

## 2013-04-20 DIAGNOSIS — IMO0001 Reserved for inherently not codable concepts without codable children: Secondary | ICD-10-CM | POA: Insufficient documentation

## 2013-04-20 DIAGNOSIS — G8929 Other chronic pain: Secondary | ICD-10-CM | POA: Insufficient documentation

## 2013-04-20 DIAGNOSIS — R5381 Other malaise: Secondary | ICD-10-CM | POA: Insufficient documentation

## 2013-04-20 DIAGNOSIS — M549 Dorsalgia, unspecified: Secondary | ICD-10-CM | POA: Insufficient documentation

## 2013-04-20 MED ORDER — TRAMADOL HCL 50 MG PO TABS: 50.0000 mg | ORAL_TABLET | Freq: Four times a day (QID) | ORAL | Status: DC | PRN

## 2013-04-20 NOTE — ED Notes (Signed)
Pt reports left leg pain for 1 month, denies any known injury has been seen in the ed x2 for the same. Also has ?insect bite to the same leg for 1 week.

## 2013-04-20 NOTE — ED Provider Notes (Signed)
CSN: [id number]     Arrival date & time 04/20/13  1029 History     First MD Initiated Contact with Patient 04/20/13 1105     Chief Complaint  Patient presents with  . Leg Pain  . Insect Bite   (Consider location/radiation/quality/duration/timing/severity/associated sxs/prior Treatment) HPI Comments: Gary Vasquez is a 62 y.o. Male presenting with a now 2 month history of left lower leg pain and numbness along with weakness and foot drop of the left foot. He has localized pain in his deep medial calf which he states feels like "muscle soreness".   He was evaluated through inpatient admission for these complaints the end of last month and his testing including MRI of the brain and lumbar spine were normal although he had some mild lumbar foraminal compression. However,  Neurology consult obtained suggested source of pain, numbness and weakness was probably due to his chronic alcoholism and nutrition deficiency. It was recommended that he have EMG studies in the outpatient setting which he has not yet done.  He denies worsening symptoms today but is just not better.  He most recently was started on librium and is attending AA, and reports sobriety for the past 5 days.  He also has complaint of a tender insect bite on his left lateral calf for the past week.  He does not know what bit him, but the site remains tender.  He denies drainage from the site,  There has been no fevers or chills, no red streaking from the area. He has taken motrin without relief of symptoms.         The history is provided by the patient.    Past Medical History  Diagnosis Date  . Crohn disease   . Acid reflux   . Hypertension   . Alcoholic hepatitis   . Arthritis   . Dumping syndrome   . Paraesophageal hernia   . Chronic diarrhea   . Subdural hematoma   . Gastroparesis   . Migraine headache   . Chronic back pain   . TIA (transient ischemic attack)    Past Surgical History  Procedure Laterality Date  .  Nissen fundoplication    . Colon resection Right   . Pyloroplasty  2009   Family History  Problem Relation Age of Onset  . Healthy Daughter   . Healthy Son    History  Substance Use Topics  . Smoking status: Current Some Day Smoker -- 0.50 packs/day    Types: Cigarettes  . Smokeless tobacco: Never Used     Comment: 2-3 cigarrettes a day  . Alcohol Use: Yes     Comment: He is drinking 3-4 drinks a day.     Review of Systems  Constitutional: Negative for fever and chills.  HENT: Negative for congestion, sore throat and neck pain.   Eyes: Negative.   Respiratory: Negative for chest tightness and shortness of breath.   Cardiovascular: Negative for chest pain.  Gastrointestinal: Negative for nausea and abdominal pain.  Genitourinary: Negative.   Musculoskeletal: Positive for myalgias. Negative for joint swelling and arthralgias.  Skin: Positive for wound. Negative for color change and rash.  Neurological: Positive for weakness and numbness. Negative for dizziness, light-headedness and headaches.  Hematological: Does not bruise/bleed easily.  Psychiatric/Behavioral: Negative.     Allergies  Acetaminophen  Home Medications   Current Outpatient Rx  Name  Route  Sig  Dispense  Refill  . chlordiazePOXIDE (LIBRIUM) 25 MG capsule   Oral   Take  1 capsule (25 mg total) by mouth 4 (four) times daily as needed for anxiety.   50 capsule   0   . ibuprofen (ADVIL,MOTRIN) 200 MG tablet   Oral   Take 800 mg by mouth every 6 (six) hours as needed for pain.          . Multiple Vitamin (MULTIVITAMIN WITH MINERALS) TABS   Oral   Take 1 tablet by mouth daily.         . traZODone (DESYREL) 50 MG tablet   Oral   Take 1 tablet (50 mg total) by mouth at bedtime.   30 tablet   0   . traMADol (ULTRAM) 50 MG tablet   Oral   Take 1 tablet (50 mg total) by mouth every 6 (six) hours as needed for pain.   15 tablet   0    BP 121/78  Pulse 87  Temp(Src) 97.7 F (36.5 C) (Oral)   Resp 20  Ht 6' (1.829 m)  Wt 160 lb (72.576 kg)  BMI 21.7 kg/m2  SpO2 100% Physical Exam  Constitutional: He is oriented to person, place, and time. He appears well-developed and well-nourished.  HENT:  Head: Atraumatic.  Neck: Normal range of motion.  Cardiovascular:  Pulses equal bilaterally.  Dorsalis pedal pulse full, less than 3 sec cap refill.  Musculoskeletal: He exhibits no edema and no tenderness.  Unable to reproduce pain with palpation of left medial calf.  He has no cords, no peripheral edema, bilateral calves appear equal in size.  Pedal pulses are equal, no erythema.  He does have a small induration without erythema left lateral calf c/w insect bite.  No fluctuance or drainage.  Neurological: He is alert and oriented to person, place, and time. He displays no atrophy, no tremor and normal reflexes. A sensory deficit is present. No cranial nerve deficit. Coordination normal. GCS eye subscore is 4. GCS verbal subscore is 5. GCS motor subscore is 6.  Reflex Scores:      Bicep reflexes are 2+ on the right side and 2+ on the left side.      Patellar reflexes are 2+ on the right side and 2+ on the left side.      Achilles reflexes are 2+ on the right side and 2+ on the left side. Pt has 4/5 strength left ankle with flex/extension. He is able to flex the left ankle fully,  But appears to require increased effort to sustain.  Decreased sensation fine touch dorsal foot.  No ataxia.    No pronator drift.  No nystagmus.    Skin: Skin is warm and dry.  Psychiatric: He has a normal mood and affect.    ED Course   Procedures (including critical care time)  Labs Reviewed - No data to display Dg Tibia/fibula Left  04/20/2013   *RADIOLOGY REPORT*  Clinical Data: Pain down the posterior left leg for 1 month.  LEFT TIBIA AND FIBULA - 2 VIEW  Comparison: None.  Findings: No fracture or bone lesion.  The ankle knee joints normally aligned.  The soft tissues are unremarkable.  IMPRESSION:  Normal left tibia and fibula radiographs.   Original Report Authenticated By: Amie Portland, M.D.   1. Chronic leg pain, left     MDM  Patients labs and/or radiological studies were viewed and considered during the medical decision making and disposition process.  Also review of MRI's and other testing from recent hospitalization.  Pt apparently left ama from that hospitalization and  therefore did not receive recommendations for further testing. He was referred to Dr. Gerilyn Pilgrim for EMG studies.  His chronic calf pain may be from deep muscle/tendon strain secondary to altered gait with left foot neuropathy and foot drop although drop is not appreciated on exam today. He was placed in a cam walker to help rest the ankle joint/calf.  Encouraged establishing care with pcp which he is doing - referrals given.  Prescribed small amount of tramadol for pain.  Doubt dvt as source of calf pain - present x 2 months, no edema.    Burgess Amor, PA-C 04/21/13 1901

## 2013-04-22 NOTE — ED Provider Notes (Signed)
Medical screening examination/treatment/procedure(s) were performed by non-physician practitioner and as supervising physician I was immediately available for consultation/collaboration. Devoria Albe, MD, FACEP   Ward Givens, MD 04/22/13 [phone]

## 2013-05-01 ENCOUNTER — Encounter: Payer: Medicaid Other | Admitting: Family Medicine

## 2013-05-02 ENCOUNTER — Encounter: Payer: Self-pay | Admitting: Family Medicine

## 2013-05-02 ENCOUNTER — Ambulatory Visit (INDEPENDENT_AMBULATORY_CARE_PROVIDER_SITE_OTHER): Payer: Medicaid Other | Admitting: Family Medicine

## 2013-05-02 VITALS — BP 136/82 | Temp 97.5°F | Wt 166.1 lb

## 2013-05-02 DIAGNOSIS — N529 Male erectile dysfunction, unspecified: Secondary | ICD-10-CM

## 2013-05-02 DIAGNOSIS — M79609 Pain in unspecified limb: Secondary | ICD-10-CM

## 2013-05-02 DIAGNOSIS — M79605 Pain in left leg: Secondary | ICD-10-CM

## 2013-05-02 DIAGNOSIS — K219 Gastro-esophageal reflux disease without esophagitis: Secondary | ICD-10-CM

## 2013-05-02 MED ORDER — TRAMADOL HCL 50 MG PO TABS: 50.0000 mg | ORAL_TABLET | Freq: Four times a day (QID) | ORAL | Status: DC | PRN

## 2013-05-02 MED ORDER — RANITIDINE HCL 150 MG PO TABS: 150.0000 mg | ORAL_TABLET | Freq: Every day | ORAL | Status: DC

## 2013-05-02 MED ORDER — SILDENAFIL CITRATE 100 MG PO TABS: ORAL_TABLET | ORAL | Status: DC

## 2013-05-02 NOTE — Patient Instructions (Addendum)

## 2013-05-02 NOTE — Progress Notes (Signed)
  Subjective:    Patient ID: Gary Vasquez, male    DOB: 1951/06/27, 62 y.o.   MRN: [id number]  HPI Pt here for hospital f/u. He was admitted per him for leg pain but the notes indiucate alcohol intoxication. Per the pt, he never has gotten intoxicated, just buzzed. Regardless, he continues to go to [facility] and says he is still sober. He has a bottle of librium as well as one of xanax although he says he does not take them together.   He has a h/o anxiety, for which the xanax was prescribed and he thinks it works better overall than the librium.   He has a h/o alcoholic hepatitis and sees GI. He also has gerd and was on a ppi in the past but has recently been doing well on zantac. He has an appt scheduled in October with GI.   He has had leg pain for some time (he is not certain when it started). Today he is in a walking boot that he says he was given in the hospital but says the leg feels better out of the boot. He had extensive imaging done in the hospital and does have some neuro findings (see reports). He was supposed to have more evaluation including EMG testing but unfortunatly left AMA. He was referred per hospital notes, no neuro but tells me today that he needs the referral to come form his pcp. He was given ultram in the past for his leg pain and says although he rarely need to take it, it does help.   He does continue to smoke tobacco although says [facility] is working with him on quitting that as well.   Finally, he requests a refill on Viagra. He does not take any nitrates.   gerd - on zantac, sees GI  Leg pain -  Referred to neuro, refilled ultram, boot on/off  Alcoholism - still in [facility], sober, off librium  Anxiety - took xanax, will let [facility] manage  Liver disease - sees   Tobacco abuse - 4-5 cigs per day, [facility] working with him, no inhalers       Review of Systems pos for leg pain, neg for anxiety today     Objective:   Physical Exam  Nursing  note and vitals reviewed. Constitutional: He appears well-developed and well-nourished.  Cardiovascular: Normal rate, regular rhythm and normal heart sounds.   Pulmonary/Chest: Effort normal and breath sounds normal.  Abdominal: Soft. Bowel sounds are normal.  Skin: Skin is warm.  Psychiatric: He has a normal mood and affect. His behavior is normal.          Assessment & Plan:  gerd - on zantac, sees GI  Leg pain -  Referred to neuro, refilled ultram (one time - will not refill again), boot on/off as desired  Alcoholism - stay in [facility]  Anxiety - emphasized not mixing librium with xanax. To be followed by [facility].  Liver disease - sees GI  Tobacco abuse - continue to follow with [facility]. Counselling given, 3-5 minutes.  Pt to f/u in 1 month for cpe, earlier if needed

## 2013-05-19 ENCOUNTER — Telehealth: Payer: Self-pay | Admitting: *Deleted

## 2013-05-19 NOTE — Telephone Encounter (Signed)
Pharmacy notified. Ultram d/c

## 2013-05-19 NOTE — Telephone Encounter (Signed)
Please let pharmacy know I appreciate the call very much. I did not know he was on trazodone as he did not bring that bottle with him nor is it listed in our system. Please ask pharmacy not to fill the ultram due to this potential interaction. I will remove ultram from his meds in his chart. Thanks AW.

## 2013-05-19 NOTE — Addendum Note (Signed)
Addended by: Acey Lav on: 05/19/2013 12:47 PM   Modules accepted: Orders

## 2013-05-19 NOTE — Telephone Encounter (Signed)
Pharmacy called and stated that they had a drug interaction alert with Tramadol and Trazodone and pharmacy wanted to be sure MD aware. Will route to MD

## 2013-05-21 ENCOUNTER — Ambulatory Visit: Payer: Medicaid Other | Admitting: Diagnostic Neuroimaging

## 2013-05-28 HISTORY — PX: OTHER SURGICAL HISTORY: SHX169

## 2013-06-02 ENCOUNTER — Ambulatory Visit (INDEPENDENT_AMBULATORY_CARE_PROVIDER_SITE_OTHER): Payer: Medicaid Other | Admitting: Family Medicine

## 2013-06-02 ENCOUNTER — Encounter: Payer: Self-pay | Admitting: Family Medicine

## 2013-06-02 VITALS — Temp 95.8°F | Ht 72.0 in | Wt 161.5 lb

## 2013-06-02 DIAGNOSIS — G4700 Insomnia, unspecified: Secondary | ICD-10-CM

## 2013-06-02 DIAGNOSIS — Z0000 Encounter for general adult medical examination without abnormal findings: Secondary | ICD-10-CM

## 2013-06-02 LAB — TSH: TSH: 2.046 u[IU]/mL (ref 0.350–4.500)

## 2013-06-02 LAB — LDL CHOLESTEROL, DIRECT: Direct LDL: 144 mg/dL — ABNORMAL HIGH

## 2013-06-02 MED ORDER — ALPRAZOLAM 0.25 MG PO TABS: 0.2500 mg | ORAL_TABLET | Freq: Every evening | ORAL | Status: DC | PRN

## 2013-06-02 NOTE — Progress Notes (Addendum)
Gary Vasquez is here for health Maintanance exam and for f/u on 3 chronic problems: 1 - anxiety/insomnia - he had a sleep study done "many years ago" and was told he needed cpap but did not want to use the mask. He feels his anxiety is what keeps him up. Xanax is the only thing he has found to help.  2 - leg pain - had significant leg pain on his last hospital admission. Underwent some neuroimaging and was going to be referred to neuro for further testing (emg, etc) but signed out AMA. Last time I saw him, I referred him to neuro but he did not go and does not plan to go. He says his leg pain has improved.  3 - alcoholism - Pt still going to [facility] although he says he does not get much out of it. He drinks (per him) 2-3 beers 2-3 times weekly. Pt continues to deny that he has ever had an alcohol problem. 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   # Health maint - pap - n/a - mammo/pert FH - n/a  - physician breast exam - n/a  - PSA and DRE- has never had screening, dad had it and had prostatectomy - c-scope/pert FH - has not had c-scope - dexa - n/a - cholesterol screen - has had triglycerides checked but not flp - fasting glucose screen -  Has not been checked - chlamydia screen (if male under age 42) n/a - tobacco - has not smoked for 4 days, prior to that has smoked daily for many years - alcohol - drinks 2-3 beers 2-3 times weekly - drugs - none other - Hep C (born 63-1963) - negative as of 2012 - tdap (once over age 31 in place of DT) - had in [place] in 2010 - DT (every 10 years until age 62, once age 60+) 40 - flu - needs today - pneumovax - at age 38 - zostavax (if over 32) - at age 20 - Vit D - will check  ROS - per hpi  PE -  Nursing note and vitals reviewed. Constitutional: he is oriented to person, place, and time. he appears well-developed and well-nourished.  HENT:  Right Ear: External ear normal.  Left Ear: External ear normal.   Nose: Nose normal.  Mouth/Throat: Oropharynx is clear and moist. No oropharyngeal exudate.  Eyes: Conjunctivae are normal. Pupils are equal, round, and reactive to light.  Neck: Normal range of motion. Neck supple. No thyromegaly present.  Cardiovascular: Normal rate, regular rhythm and normal heart sounds.   Pulmonary/Chest: Effort normal and breath sounds normal.  Abdominal: Soft. Bowel sounds are normal. he exhibits no distension. There is no tenderness. There is no rebound.  Lymphadenopathy:    he has no cervical adenopathy.  Neurological: She is alert and oriented to person, place, and time. She has normal reflexes.  Skin: Skin is warm and dry. 5mm slightly raised mole on top of head, pt says it sometimes itches. 4mm slightly raised pearly area on upper right back.  Psychiatric: She has a normal mood and affect. Her behavior is normal.    A/P Insomnia - Plan: Nocturnal polysomnography (NPSG), ALPRAZolam (XANAX) 0.25 MG tablet  Health maintenance examination - Plan: Flu vaccine greater than or equal to 3yo preservative free IM, TSH, Vit D  25 hydroxy (rtn osteoporosis monitoring), LDL cholesterol, direct  Health maint - as above, other labs have been recently checked in the hospital, will send a  note to his GI team. He is due (per him) for screening c-scope but their records may have more information.  Insomnia - refilled xanax for this month ONLY. Pt referred for sleep study and needs to get it done. If sleep study results do not help his insomnia, we can refer to [facility].   Leg pain - he will let us know if pain returns and he'd like to see neuro.   Moles - rtc for removal of both moles and skin survey. Advised he not take any ibuprofen for a few days prior. Will need to use caution in biopsies, as he has poor clotting given hx.   Tobacco - counseled for 5 minutes on cessation and risks to his health. Pt insists he has quit for good.

## 2013-06-02 NOTE — Patient Instructions (Addendum)

## 2013-06-03 LAB — VITAMIN D 25 HYDROXY (VIT D DEFICIENCY, FRACTURES): Vit D, 25-Hydroxy: 26 ng/mL — ABNORMAL LOW (ref 30–89)

## 2013-06-04 ENCOUNTER — Telehealth: Payer: Self-pay | Admitting: *Deleted

## 2013-06-04 NOTE — Telephone Encounter (Signed)
Pt called and left VM stating that he was seen by MD and that he was under impression that MD would supply him with Rx for Xanax. He stated that he needed his Rx ASAP. Will route to MD

## 2013-06-04 NOTE — Telephone Encounter (Signed)
I have his prescription from yesterday. It was left in the room. I'll leave it on my top stack tray for when the patient wants to come by and get it. Thanks AW.

## 2013-06-04 NOTE — Telephone Encounter (Signed)
I called it into [facility]. Hard copy not needed

## 2013-06-04 NOTE — Telephone Encounter (Signed)
Pt called and questioned his Rx for Xanax again. After chart review noted MD released an order for Xanax 0.25 mg po qhs prn sleep and 0.125 mg-0.25 mg po BID anxiety. Spoke with pharmacy who stated they never received Rx. Verbal given. Pt notified and appreciative. Stated he also needed to make an appointment for skin biopsy per MD request. Appointment given for next week.

## 2013-06-09 ENCOUNTER — Ambulatory Visit: Payer: Self-pay | Admitting: Family Medicine

## 2013-06-11 ENCOUNTER — Other Ambulatory Visit: Payer: Self-pay | Admitting: Family Medicine

## 2013-06-11 ENCOUNTER — Telehealth: Payer: Self-pay | Admitting: *Deleted

## 2013-06-11 ENCOUNTER — Ambulatory Visit: Payer: Medicaid Other | Attending: Family Medicine | Admitting: Sleep Medicine

## 2013-06-11 DIAGNOSIS — R0609 Other forms of dyspnea: Secondary | ICD-10-CM | POA: Insufficient documentation

## 2013-06-11 DIAGNOSIS — G4730 Sleep apnea, unspecified: Secondary | ICD-10-CM | POA: Insufficient documentation

## 2013-06-11 DIAGNOSIS — E559 Vitamin D deficiency, unspecified: Secondary | ICD-10-CM

## 2013-06-11 DIAGNOSIS — R5381 Other malaise: Secondary | ICD-10-CM | POA: Insufficient documentation

## 2013-06-11 DIAGNOSIS — R0989 Other specified symptoms and signs involving the circulatory and respiratory systems: Secondary | ICD-10-CM | POA: Insufficient documentation

## 2013-06-11 DIAGNOSIS — G4761 Periodic limb movement disorder: Secondary | ICD-10-CM | POA: Insufficient documentation

## 2013-06-11 DIAGNOSIS — G4700 Insomnia, unspecified: Secondary | ICD-10-CM

## 2013-06-11 MED ORDER — CHOLECALCIFEROL 1.25 MG (50000 UT) PO CAPS: 50000.0000 [IU] | ORAL_CAPSULE | ORAL | Status: DC

## 2013-06-11 NOTE — Progress Notes (Signed)
Please let pt know that his vitamin D level is low. I have sent him in a prescription for high dose D once weekly for 8 weeks. After that we'll check the level again to make sure it is high enough. I have placed an order for him to get his D checked in 8 weeks at his convenience (no need to fast).

## 2013-06-11 NOTE — Telephone Encounter (Signed)
Patient made aware of vitamin d results. Informed patient that dr. Lucretia Roers sent in a prescription for high dose vit d; to be taken once weekly x 8 weeks.

## 2013-06-12 ENCOUNTER — Ambulatory Visit (INDEPENDENT_AMBULATORY_CARE_PROVIDER_SITE_OTHER): Payer: Medicaid Other | Admitting: Internal Medicine

## 2013-06-12 ENCOUNTER — Telehealth (INDEPENDENT_AMBULATORY_CARE_PROVIDER_SITE_OTHER): Payer: Self-pay | Admitting: *Deleted

## 2013-06-12 ENCOUNTER — Encounter (INDEPENDENT_AMBULATORY_CARE_PROVIDER_SITE_OTHER): Payer: Self-pay | Admitting: Internal Medicine

## 2013-06-12 ENCOUNTER — Other Ambulatory Visit (INDEPENDENT_AMBULATORY_CARE_PROVIDER_SITE_OTHER): Payer: Self-pay | Admitting: *Deleted

## 2013-06-12 VITALS — BP 122/70 | HR 80 | Temp 98.4°F | Ht 72.0 in | Wt 161.7 lb

## 2013-06-12 DIAGNOSIS — K5090 Crohn's disease, unspecified, without complications: Secondary | ICD-10-CM

## 2013-06-12 DIAGNOSIS — K7010 Alcoholic hepatitis without ascites: Secondary | ICD-10-CM

## 2013-06-12 DIAGNOSIS — Z1211 Encounter for screening for malignant neoplasm of colon: Secondary | ICD-10-CM

## 2013-06-12 LAB — SEDIMENTATION RATE: Sed Rate: 1 mm/hr (ref 0–16)

## 2013-06-12 LAB — CBC WITH DIFFERENTIAL/PLATELET
Basophils Absolute: 0 10*3/uL (ref 0.0–0.1)
Basophils Relative: 1 % (ref 0–1)
Eosinophils Relative: 1 % (ref 0–5)
HCT: 48.5 % (ref 39.0–52.0)
MCHC: 34.4 g/dL (ref 30.0–36.0)
Monocytes Absolute: 0.8 10*3/uL (ref 0.1–1.0)
Neutro Abs: 5 10*3/uL (ref 1.7–7.7)
Platelets: 186 10*3/uL (ref 150–400)
RDW: 13.2 % (ref 11.5–15.5)
WBC: 8.4 10*3/uL (ref 4.0–10.5)

## 2013-06-12 LAB — COMPREHENSIVE METABOLIC PANEL
ALT: 39 U/L (ref 0–53)
AST: 27 U/L (ref 0–37)
Alkaline Phosphatase: 69 U/L (ref 39–117)
BUN: 16 mg/dL (ref 6–23)
Creat: 1.17 mg/dL (ref 0.50–1.35)
Potassium: 5.5 mEq/L — ABNORMAL HIGH (ref 3.5–5.3)

## 2013-06-12 MED ORDER — PEG-KCL-NACL-NASULF-NA ASC-C 100 G PO SOLR: 1.0000 | Freq: Once | ORAL | Status: DC

## 2013-06-12 NOTE — Patient Instructions (Signed)
CMET, CBC today. OV in 6 months.

## 2013-06-12 NOTE — Progress Notes (Signed)
Subjective:     Patient ID: Gary Vasquez, male   DOB: 1951/05/19, 62 y.o.   MRN: [id number]  HPI Here today for f/u. He was last seen in July.  Says he has been doing good. He has good days and bad days. He says his appetite is better. He has gained 6 pounds since his last visit in July.  Hx of dumping syndrome.  Hx of Crohn disease diagnosed at age 72 and he underwent a colon resection. After eating he has mid abdominal pain which last about an hour which he says is related to his dumping syndrome. He is eating3 meals a day.  He usually has 4-6 BMs a day. Some of his stools are formed and some ar not. No rectal bleeding.  He continues to drink. He tells me he will drink 1-2 beer every other day.  His primary care physician is Dr. Clydene Laming at [place].  Hx of alcoholic hepatitis.  Hep. B and C markers have been negative in the past. Patient denies having an alcohol problems but recently evaluated by [facility] for depression and etoh abuse. Last weight 156.4  04/25/2005 EGD/Colonoscopy: FINAL DIAGNOSIS:  1. Distal esophageal ring which was disrupted by passing 52- and 54-French  Maloney dilators.  2. Small sliding hiatal hernia.  3. Fundal wrap has undone itself.  4. No evidence of peptic ulcer disease.  5. Normal colonoscopy.  6. Single erosion at the ileal colonic anastomosis. Biopsy taken from this  area. Neoterminal ileum for at least 20 cm were normal.  7. As far as his Crohn's disease is concerned, he appears to be in  remission.  CBC    Component Value Date/Time   WBC 7.3 04/15/2013 1021   RBC 4.47 04/15/2013 1021   HGB 14.8 04/15/2013 1021   HCT 42.5 04/15/2013 1021   PLT 139* 04/15/2013 1021   MCV 95.1 04/15/2013 1021   MCH 33.1 04/15/2013 1021   MCHC 34.8 04/15/2013 1021   RDW 14.8 04/15/2013 1021   LYMPHSABS 1.7 04/15/2013 1021   MONOABS 0.7 04/15/2013 1021   EOSABS 0.0 04/15/2013 1021   BASOSABS 0.0 04/15/2013 1021   CMP     Component Value Date/Time   NA 135  04/15/2013 1021   K 4.3 04/15/2013 1021   CL 96 04/15/2013 1021   CO2 26 04/15/2013 1021   GLUCOSE 102* 04/15/2013 1021   BUN 14 04/15/2013 1021   CREATININE 1.00 04/15/2013 1021   CREATININE 1.37* 03/13/2013 1525   CALCIUM 10.1 04/15/2013 1021   PROT 8.3 04/15/2013 1021   ALBUMIN 4.4 04/15/2013 1021   AST 63* 04/15/2013 1021   ALT 66* 04/15/2013 1021   ALKPHOS 99 04/15/2013 1021   BILITOT 1.4* 04/15/2013 1021   GFRNONAA 80* 04/15/2013 1021   GFRAA >90 04/15/2013 1021        Review of Systems     Current Outpatient Prescriptions  Medication Sig Dispense Refill  . ALPRAZolam (XANAX) 0.25 MG tablet Take 1 tablet (0.25 mg total) by mouth at bedtime as needed for sleep. Take 1/2 tablet -1 tablet twice a day as needed for anxiety  30 tablet  0  . Cholecalciferol 50000 UNITS capsule Take 1 capsule (50,000 Units total) by mouth once a week.  8 capsule  0  . Multiple Vitamin (MULTIVITAMIN WITH MINERALS) TABS Take 1 tablet by mouth daily.      . NON FORMULARY A2X for calm and focus takes one tablet a day      .  ranitidine (ZANTAC) 150 MG tablet Take 1 tablet (150 mg total) by mouth at bedtime.  90 tablet  2  . sertraline (ZOLOFT) 50 MG tablet Take 50 mg by mouth daily.      . sildenafil (VIAGRA) 100 MG tablet Take 1 tab up to once daily, 2mn to 4 hours before sexual activity.  30 tablet  1  . traZODone (DESYREL) 50 MG tablet Take 1 tablet (50 mg total) by mouth at bedtime.  30 tablet  0  . ranitidine (ZANTAC) 150 MG tablet Take 150 mg by mouth at bedtime.       No current facility-administered medications for this visit.   Past Medical History  Diagnosis Date  . Crohn disease   . Acid reflux   . Hypertension   . Alcoholic hepatitis   . Arthritis   . Dumping syndrome   . Paraesophageal hernia   . Chronic diarrhea   . Subdural hematoma   . Gastroparesis   . Migraine headache   . Chronic back pain   . TIA (transient ischemic attack)    Past Surgical History  Procedure Laterality Date  .  Nissen fundoplication    . Colon resection Right   . Pyloroplasty  2009   Allergies  Allergen Reactions  . Acetaminophen Other (See Comments)    Liver issues    Objective:   Physical Exam  Filed Vitals:   06/12/13 0947  BP: 122/70  Pulse: 80  Temp: 98.4 F (36.9 C)  Height: 6' (1.829 m)  Weight: 161 lb 11.2 oz (73.347 kg)  Alert and oriented. Skin warm and dry. Oral mucosa is moist.   . Sclera anicteric, conjunctivae is pink. Thyroid not enlarged. No cervical lymphadenopathy. Lungs clear. Heart regular rate and rhythm.  Abdomen is soft. Bowel sounds are positive. No hepatomegaly. No abdominal masses felt. No tenderness.  No edema to lower extremities.        Assessment:    Alcoholic hepatitis. LIver enzymes continue to be elevated. He continues to drinking . Crohn's disease. He appears to be in remission. His last colonoscopy was in 2006. In need of surveillance colonoscopy.     Plan:    CBC,. CMET, sed rate OV in 6 months. Surveillance colonoscopy for Crohn's

## 2013-06-12 NOTE — Telephone Encounter (Signed)
Patient needs movi prep 

## 2013-06-12 NOTE — Procedures (Signed)
[facility] Gary Vasquez A. Gerilyn Pilgrim, MD     [email or web address]        NAME:  Gary, Vasquez NO.:  [id number]  MEDICAL RECORD NO.:  [id number]          PATIENT TYPE:  OUT  LOCATION:  SLEEP LAB                     FACILITY:  [facility]  PHYSICIAN:  [name] A. Gerilyn Pilgrim, M.D. DATE OF BIRTH:  10/12/1950  DATE OF STUDY:  06/11/2013                           NOCTURNAL POLYSOMNOGRAM  REFERRING PHYSICIAN:  [name]  INDICATIONS:  A 62 year old, who presents with loud snoring, witnessed apnea, and fatigue.  MEDICATIONS:  Gabapentin, alprazolam, sertraline, trazodone, ranitidine, multivitamin.  EPWORTH SLEEPINESS SCALE:  3.  BMI:  22.  ARCHITECTURAL SUMMARY:  The total recording time 361 minutes.  Sleep efficiency 81%.  Sleep latency 20 minutes.  REM latency 157 minutes. Stage N1 14%, N2 68%, N3 0.5%, and REM sleep 17%.  RESPIRATORY SUMMARY:  Baseline oxygen saturation is 94, lowest saturation 83 during non-REM sleep.  Diagnostic AHI is 2 and RDI 3.  The REM AHI 6.  LIMB MOVEMENT SUMMARY:  PLM index 82.  ELECTROCARDIOGRAM SUMMARY:  Average heart rate is 69 with no significant dysrhythmias observed.  IMPRESSION:  Severe periodic limb movement disorder sleep.  RECOMMENDATION:  Consider trial of dopamine agonist such as Requip or Mirapex.  Thanks for this referral.    [name] A. Gerilyn Pilgrim, M.D.    KAD/MEDQ  D:  06/12/2013 08:04:36  T:  06/12/2013 14:78:29  Job:  [id number]

## 2013-06-16 ENCOUNTER — Other Ambulatory Visit: Payer: Self-pay | Admitting: Family Medicine

## 2013-06-16 ENCOUNTER — Ambulatory Visit (INDEPENDENT_AMBULATORY_CARE_PROVIDER_SITE_OTHER): Payer: Medicaid Other | Admitting: Family Medicine

## 2013-06-16 ENCOUNTER — Telehealth (INDEPENDENT_AMBULATORY_CARE_PROVIDER_SITE_OTHER): Payer: Self-pay | Admitting: Internal Medicine

## 2013-06-16 VITALS — BP 110/62 | Temp 96.0°F | Wt 161.4 lb

## 2013-06-16 DIAGNOSIS — D239 Other benign neoplasm of skin, unspecified: Secondary | ICD-10-CM

## 2013-06-16 DIAGNOSIS — D229 Melanocytic nevi, unspecified: Secondary | ICD-10-CM

## 2013-06-16 DIAGNOSIS — E875 Hyperkalemia: Secondary | ICD-10-CM

## 2013-06-16 NOTE — Patient Instructions (Signed)
Biopsy °Care After °These instructions give you information on caring for yourself after your procedure. Your doctor may also give you more specific instructions. Call your doctor if you have any problems or questions after your procedure. °HOME CARE  °· Return to your normal diet and activities as told by your doctor. °· Change your bandages (dressings) as told by your doctor. If skin glue (adhesive) was used, it will peel off in 7 days. °· Only take medicines as told by your doctor. °· Ask your doctor when you can bathe and get your wound wet. °GET HELP RIGHT AWAY IF: °· You see more than a small spot of blood coming from the wound. °· You have redness, puffiness (swelling), or pain. °· You see yellowish-white fluid (pus) coming from the wound. °· You have a fever. °· You notice a bad smell coming from the wound or bandage. °· You have a rash, trouble breathing, or any allergy problems. °MAKE SURE YOU:  °· Understand these instructions. °· Will watch your condition. °· Will get help right away if you are not doing well or get worse. °Document Released: 04/18/2011 Document Revised: 11/06/2011 Document Reviewed: 04/18/2011 °ExitCare® Patient Information ©2014 [employer]. ° °

## 2013-06-16 NOTE — Telephone Encounter (Signed)
Am going to repeat potassium on him.

## 2013-06-16 NOTE — Progress Notes (Signed)
Patient ID: Gary Vasquez, male   DOB: 1950/12/07, 62 y.o.   MRN: [id number] Pt here for bx of mole on head and one on his back. Both have been enlarging and have itched some, esp the one on his head. He has a h/o liver disease due to etoh so I plan to be as minimalistic as possible given bleeding risk.   Punch Biopsy Procedure Note  Pre-operative Diagnosis: Dysplastic nevi  Post-operative Diagnosis: same  Locations:top  head  Indications: enlarging, itching  Anesthesia: Lidocaine 1% with epinephrine with added sodium bicarbonate  Procedure Details  History of allergy to iodine: no Patient informed of the risks (including bleeding and infection) and benefits of the  procedure and Verbal informed consent obtained.  The lesion and surrounding area was given a sterile prep using betadyne and draped in the usual sterile fashion. The skin was then stretched perpendicular to the skin tension lines and the lesion removed using the 3mm punch. The resulting ellipse was then closed. The wound was closed with no sutures needed. Antibiotic ointment and a sterile dressing applied. The specimen was sent for pathologic examination. The patient tolerated the procedure well.  EBL: 5 ml  Findings: none  Condition: Stable  Complications: bleeding. - controlled with silver nitrite sticks.  Plan: 1. Instructed to keep the wound dry and covered for 24-48h and clean thereafter. 2. Warning signs of infection were reviewed.    Punch Biopsy Procedure Note  Pre-operative Diagnosis: Dysplastic nevi  Post-operative Diagnosis: same  Locations:medial trunk  Indications: changing mole - irregular borders  Anesthesia: Lidocaine 1% with epinephrine with added sodium bicarbonate  Procedure Details  History of allergy to iodine: no Patient informed of the risks (including bleeding and infection) and benefits of the  procedure and Verbal informed consent obtained.  The lesion and surrounding area  was given a sterile prep using betadyne and draped in the usual sterile fashion. The skin was then stretched perpendicular to the skin tension lines and the lesion removed using the 3mm punch. The resulting ellipse was then closed.  Antibiotic ointment and a sterile dressing applied. The specimen was sent for pathologic examination. The patient tolerated the procedure well.  EBL: <1 ml  Findings: n/a  Condition: Stable  Complications: none.  Plan: 1. Instructed to keep the wound dry and covered for 24-48h and clean thereafter. 2. Warning signs of infection were reviewed.

## 2013-06-17 LAB — COMPREHENSIVE METABOLIC PANEL
ALT: 44 U/L (ref 0–53)
AST: 43 U/L — ABNORMAL HIGH (ref 0–37)
Albumin: 4.4 g/dL (ref 3.5–5.2)
CO2: 22 mEq/L (ref 19–32)
Calcium: 9.5 mg/dL (ref 8.4–10.5)
Chloride: 105 mEq/L (ref 96–112)
Potassium: 4.4 mEq/L (ref 3.5–5.3)
Sodium: 138 mEq/L (ref 135–145)
Total Protein: 7.1 g/dL (ref 6.0–8.3)

## 2013-06-19 ENCOUNTER — Telehealth: Payer: Self-pay | Admitting: *Deleted

## 2013-06-19 ENCOUNTER — Other Ambulatory Visit: Payer: Self-pay | Admitting: Family Medicine

## 2013-06-19 DIAGNOSIS — D225 Melanocytic nevi of trunk: Secondary | ICD-10-CM

## 2013-06-19 NOTE — Telephone Encounter (Signed)
Left patient mother a message to inform terel to return our call.

## 2013-06-19 NOTE — Telephone Encounter (Signed)
Patient returned call. Informed patient about biopsy results. Explained to him why we were going to refer him to derm.

## 2013-06-23 ENCOUNTER — Encounter (HOSPITAL_COMMUNITY): Payer: Self-pay | Admitting: Pharmacy Technician

## 2013-07-02 ENCOUNTER — Encounter (HOSPITAL_COMMUNITY)
Admission: RE | Disposition: A | Discharge status: Home / Self Care | Payer: Self-pay | Source: Ambulatory Visit | Attending: Internal Medicine

## 2013-07-02 ENCOUNTER — Encounter (HOSPITAL_COMMUNITY): Payer: Self-pay | Admitting: *Deleted

## 2013-07-02 ENCOUNTER — Ambulatory Visit (HOSPITAL_COMMUNITY)
Admission: RE | Admit: 2013-07-02 | Discharge: 2013-07-02 | Disposition: A | Discharge status: Home / Self Care | Payer: Medicaid Other | Source: Ambulatory Visit | Attending: Internal Medicine | Admitting: Internal Medicine

## 2013-07-02 DIAGNOSIS — I10 Essential (primary) hypertension: Secondary | ICD-10-CM | POA: Insufficient documentation

## 2013-07-02 DIAGNOSIS — K5090 Crohn's disease, unspecified, without complications: Secondary | ICD-10-CM

## 2013-07-02 DIAGNOSIS — K644 Residual hemorrhoidal skin tags: Secondary | ICD-10-CM

## 2013-07-02 DIAGNOSIS — R197 Diarrhea, unspecified: Secondary | ICD-10-CM | POA: Insufficient documentation

## 2013-07-02 DIAGNOSIS — D126 Benign neoplasm of colon, unspecified: Secondary | ICD-10-CM | POA: Insufficient documentation

## 2013-07-02 DIAGNOSIS — K5000 Crohn's disease of small intestine without complications: Secondary | ICD-10-CM

## 2013-07-02 HISTORY — PX: COLONOSCOPY: SHX5424

## 2013-07-02 SURGERY — COLONOSCOPY
Anesthesia: Moderate Sedation

## 2013-07-02 MED ORDER — SODIUM CHLORIDE 0.9 % IV SOLN
INTRAVENOUS | Status: DC
  Administered 2013-07-02: 1000 mL via INTRAVENOUS

## 2013-07-02 MED ORDER — MEPERIDINE HCL 50 MG/ML IJ SOLN
INTRAMUSCULAR | Status: AC
  Filled 2013-07-02: qty 1

## 2013-07-02 MED ORDER — STERILE WATER FOR IRRIGATION IR SOLN
Status: DC | PRN
  Administered 2013-07-02: 17:00:00

## 2013-07-02 MED ORDER — MIDAZOLAM HCL 5 MG/5ML IJ SOLN
INTRAMUSCULAR | Status: AC
  Filled 2013-07-02: qty 10

## 2013-07-02 MED ORDER — MIDAZOLAM HCL 5 MG/5ML IJ SOLN
INTRAMUSCULAR | Status: DC | PRN
  Administered 2013-07-02 (×2): 2 mg via INTRAVENOUS
  Administered 2013-07-02: 3 mg via INTRAVENOUS

## 2013-07-02 MED ORDER — MEPERIDINE HCL 50 MG/ML IJ SOLN
INTRAMUSCULAR | Status: DC | PRN
  Administered 2013-07-02 (×2): 25 mg via INTRAVENOUS

## 2013-07-02 NOTE — H&P (Signed)
Gary Vasquez is an 62 y.o. male.   Chief Complaint: Patient is here for colonoscopy. HPI: Patient is 62 year old Caucasian male with history of Crohn disease. Surgery at age 25. He has remained in remission he has chronic diarrhea. He's having 4-5 stools daily. He denies abdominal pain rectal bleeding or melena. He is here for surveillance colonoscopy. His last exam was over 8 years ago. He has good appetite and his weight has been stable.  Past Medical History  Diagnosis Date  . Crohn disease   . Acid reflux   . Hypertension   . Alcoholic hepatitis   . Arthritis   . Dumping syndrome   . Paraesophageal hernia   . Chronic diarrhea   . Subdural hematoma   . Gastroparesis   . Migraine headache   . Chronic back pain   . TIA (transient ischemic attack)     Past Surgical History  Procedure Laterality Date  . Nissen fundoplication    . Colon resection Right   . Pyloroplasty  2009    Family History  Problem Relation Age of Onset  . Healthy Daughter   . Healthy Son    Social History:  reports that he has been smoking Cigarettes.  He has been smoking about 0.50 packs per day. He has never used smokeless tobacco. He reports that he drinks alcohol. He reports that he does not use illicit drugs.  Allergies:  Allergies  Allergen Reactions  . Acetaminophen Other (See Comments)    Liver issues    Medications Prior to Admission  Medication Sig Dispense Refill  . Cholecalciferol 50000 UNITS capsule Take 50,000 Units by mouth once a week. Takes on Monday      . ibuprofen (ADVIL,MOTRIN) 200 MG tablet Take 400 mg by mouth every 6 (six) hours as needed for pain.      . Multiple Vitamin (MULTIVITAMIN WITH MINERALS) TABS Take 1 tablet by mouth daily.      . peg 3350 powder (MOVIPREP) 100 G SOLR Take 1 kit (200 g total) by mouth once.  1 kit  0  . ranitidine (ZANTAC) 150 MG tablet Take 150 mg by mouth at bedtime.      . sertraline (ZOLOFT) 50 MG tablet Take 50 mg by mouth daily.      .  sildenafil (VIAGRA) 100 MG tablet Take 1 tab up to once daily, 66mn to 4 hours before sexual activity.  30 tablet  1  . traZODone (DESYREL) 50 MG tablet Take 1 tablet (50 mg total) by mouth at bedtime.  30 tablet  0    No results found for this or any previous visit (from the past 48 hour(s)). No results found.  ROS  Blood pressure 128/77, pulse 86, temperature 97.8 F (36.6 C), temperature source Oral, resp. rate 22, height 6' (1.829 m), weight 161 lb (73.029 kg), SpO2 95.00%. Physical Exam  Constitutional: He appears well-developed and well-nourished.  HENT:  Mouth/Throat: Oropharynx is clear and moist.  Eyes: Conjunctivae are normal. No scleral icterus.  Neck: No thyromegaly present.  Cardiovascular: Normal rate, regular rhythm and normal heart sounds.   No murmur heard. GI: Soft. He exhibits no distension and no mass. There is no tenderness.  Scar right lower abdomen  Musculoskeletal: He exhibits no edema.  Lymphadenopathy:    He has no cervical adenopathy.  Neurological: He is alert.  Skin: Skin is warm and dry.     Assessment/Plan History of Crohn's disease. Surveillance colonoscopy.  Thoms Barthelemy U 07/02/2013, 4:43 PM

## 2013-07-02 NOTE — Op Note (Signed)
COLONOSCOPY PROCEDURE REPORT  PATIENT:  Gary Vasquez  MR#:  [id number] Birthdate:  1951-01-20, 62 y.o., male Endoscopist:  Dr. Rogene Houston, MD Referred By:  Dr. Hildred Laser. Clydene Laming, MD Procedure Date: 07/02/2013  Procedure:   Colonoscopy  Indications: Patient is 62 year old Caucasian male with history of Crohn disease and has been in remission for several years. He is currently not taking any medications. He has chronic diarrhea with 4-5 stools per day. He is undergoing surveillance colonoscopy.  Informed Consent:  The procedure and risks were reviewed with the patient and informed consent was obtained.  Medications:  Demerol 50 mg IV Versed 7 mg IV  Description of procedure:  After a digital rectal exam was performed, that colonoscope was advanced from the anus through the rectum and colon to the area of hepatic flexure where ileocolonic anastomosis was identified. Scope was easily advanced into the neoterminal ileum and then was slowly and cautiously withdrawn. The mucosal surfaces were carefully surveyed utilizing scope tip to flexion to facilitate fold flattening as needed. The scope was pulled down into the rectum where a thorough exam including retroflexion was performed.  Findings:   Prep excellent. Normal mucosa of terminal ileum. Wide-open ileocolonic anastomosis with focal erythema but no erosions or ulcers noted. 4 mm polyp ablated via cold biopsy from sigmoid colon. Normal rectal mucosa. Small hemorrhoids below the dentate line.    Therapeutic/Diagnostic Maneuvers Performed:  See above  Complications:  None  Cecal Withdrawal Time:  10 minutes  Impression:  Normal mucosa of terminal ileum. Wide-open ileocolonic anastomosis. Single small polyp ablated via cold biopsy from sigmoid colon. Small external hemorrhoids.  Crohn's disease remains in remission.  Recommendations:  Standard instructions given. I will contact patient with biopsy results and further  recommendations. Patient advised to keep ibuprofen use to a minimum(he takes this medication on an as-needed basis).  REHMAN,NAJEEB U  07/02/2013 5:20 PM  CC: Dr. Clydene Laming, Leisa Lenz, MD & Dr. Rayne Du ref. provider found

## 2013-07-08 ENCOUNTER — Other Ambulatory Visit (INDEPENDENT_AMBULATORY_CARE_PROVIDER_SITE_OTHER): Payer: Self-pay | Admitting: Internal Medicine

## 2013-07-08 ENCOUNTER — Encounter (HOSPITAL_COMMUNITY): Payer: Self-pay | Admitting: Internal Medicine

## 2013-07-08 MED ORDER — PANTOPRAZOLE SODIUM 40 MG PO TBEC: 40.0000 mg | DELAYED_RELEASE_TABLET | Freq: Every day | ORAL | Status: DC

## 2013-07-09 ENCOUNTER — Encounter (INDEPENDENT_AMBULATORY_CARE_PROVIDER_SITE_OTHER): Payer: Self-pay | Admitting: *Deleted

## 2013-07-10 NOTE — Progress Notes (Signed)
Apt has been scheduled for 09/16/13 with Dr. Karilyn Cota.

## 2013-08-11 ENCOUNTER — Encounter (INDEPENDENT_AMBULATORY_CARE_PROVIDER_SITE_OTHER): Payer: Self-pay | Admitting: Internal Medicine

## 2013-08-11 ENCOUNTER — Ambulatory Visit (INDEPENDENT_AMBULATORY_CARE_PROVIDER_SITE_OTHER): Payer: Medicaid Other | Admitting: Internal Medicine

## 2013-08-11 ENCOUNTER — Other Ambulatory Visit (INDEPENDENT_AMBULATORY_CARE_PROVIDER_SITE_OTHER): Payer: Self-pay | Admitting: *Deleted

## 2013-08-11 VITALS — BP 114/68 | HR 82 | Temp 97.2°F | Resp 18 | Ht 72.0 in | Wt 174.6 lb

## 2013-08-11 DIAGNOSIS — R141 Gas pain: Secondary | ICD-10-CM

## 2013-08-11 DIAGNOSIS — R112 Nausea with vomiting, unspecified: Secondary | ICD-10-CM

## 2013-08-11 DIAGNOSIS — R140 Abdominal distension (gaseous): Secondary | ICD-10-CM

## 2013-08-11 DIAGNOSIS — R197 Diarrhea, unspecified: Secondary | ICD-10-CM

## 2013-08-11 MED ORDER — LOPERAMIDE HCL 2 MG PO TABS: 1.0000 mg | ORAL_TABLET | Freq: Every day | ORAL | Status: DC

## 2013-08-11 MED ORDER — RANITIDINE HCL 150 MG PO TABS: 150.0000 mg | ORAL_TABLET | Freq: Two times a day (BID) | ORAL | Status: DC

## 2013-08-11 NOTE — Patient Instructions (Signed)
Take Imodium 1 mg by mouth every day. Increase Zantac or ranitidine to 150 mg by mouth twice daily. Esophagogastroduodenoscopy to be scheduled

## 2013-08-11 NOTE — Progress Notes (Addendum)
Presenting complaint;  Nausea vomiting bloating abdominal pain and diarrhea.  Subjective:  Patient is 62 year old Caucasian male who has history of Crohn's disease and remains in remission. He underwent colonoscopy on 07/02/2013 revealing normal distal ileum wide-open ileocolonic anastomosis. Small polyp was ablated via cold biopsy from sigmoid colon and was hyperplastic. When I contacted patient with biopsy results on 07/08/2013 he was complaining of nausea vomiting abdominal pain. He was begun on pantoprazole and asked to discontinue Zantac. Patient now returns for followup visit and states that he does not feel any better. He believes pantoprazole made his bloating worse and he decided to go back on Zantac which he takes once a day. He states he hasn't felt well since he had surgery for hiatal hernia and pyloroplasty back in early 2009 when he was hospitalized for 50 days. Even though he eats 6 small meals he has at least 2-3 episodes of emesis daily. He also complains of epigastric pain bloating and 6-8 stools per day. Some of his bowel movements are explosive. He is using Imodium on as-needed basis. He denies melena or rectal bleeding. He is trying to quit cigarette smoking and now down to 3-4 cigarettes per day. He continues to drink alcohol. He states he has 2-3 drinks per day. He states alcohol calms his nerves. He also complains of insomnia. He is seeing psychiatrist. He did have sleep study recently but has not been evaluated by sleep disorder expert. He does not take OTC NSAIDs. Despite his symptoms he has gained 13 pounds in the last 2 months. He also complains of difficulty voiding.    Current Medications: Current Outpatient Prescriptions  Medication Sig Dispense Refill  . gabapentin (NEURONTIN) 100 MG capsule Take 100 mg by mouth 3 (three) times daily.      Marland Kitchen ibuprofen (ADVIL,MOTRIN) 200 MG tablet Take 400 mg by mouth every 6 (six) hours as needed for pain.      . Multiple Vitamin  (MULTIVITAMIN WITH MINERALS) TABS Take 1 tablet by mouth daily.      . ranitidine (ZANTAC) 150 MG tablet Take 150 mg by mouth at bedtime.      . sertraline (ZOLOFT) 50 MG tablet Take 100 mg by mouth daily.       . sildenafil (VIAGRA) 100 MG tablet Take 1 tab up to once daily, 104mn to 4 hours before sexual activity.  30 tablet  1  . traZODone (DESYREL) 50 MG tablet Take 1 tablet (50 mg total) by mouth at bedtime.  30 tablet  0  . pantoprazole (PROTONIX) 40 MG tablet Take 1 tablet (40 mg total) by mouth daily.  30 tablet  5   No current facility-administered medications for this visit.     Objective: Blood pressure 114/68, pulse 82, temperature 97.2 F (36.2 C), temperature source Oral, resp. rate 18, height 6' (1.829 m), weight 174 lb 9.6 oz (79.198 kg). Patient is alert and in no acute distress. Conjunctiva is pink. Sclera is nonicteric Oropharyngeal mucosa is normal. No neck masses or thyromegaly noted. Cardiac exam with regular rhythm normal S1 and S2. No murmur or gallop noted. Lungs are clear to auscultation. Abdomen is symmetrical. Bowel sounds are normal. On palpation and soft with mild tenderness epigastric region with percussion note is tympanitic. No organomegaly or masses.  No LE edema or clubbing noted.  Labs/studies Results: Lab data from 06/12/2013. WBC 8.4, H&H 16.7 and 48.5 and platelet count 186K. Electrolytes within normal limits. BUN 17, creatinine 1.25. Bilirubin 0.3, AP 63, AST  43, ALT 44, albumin 4.4 and calcium 9.5   Assessment:  #1. Recurrent nausea and vomiting in a patient with history of pyloroplasty and repair of large heart hernia in 2009 as well as history of alcoholic pancreatitis. Clinically he does not appear to have pancreatitis. He could have pyloric stenosis peptic ulcer disease or gastroparesis. #2. Diarrhea possibly secondary to chloretic  effect the bottom of colon or he could have IBS. He is not a candidate for anti-spasmodic therapy because  he has symptoms of BPH. #3. History of alcohol abuse. He has continued to drink. AST is mildly elevated which may indicate alcoholic hepatitis. In order to improve her long-term prognosis he must quit drinking alcohol. Patient encouraged to discuss his anxiety problems with his psychiatrist. If anxiety can be controlled maybe he can quit drinking alcohol. #4. History of Crohn's disease. He remains in remission.   Plan:  Increase Zantac to 150 mg by mouth twice a day. Imodium 2 mg by mouth q. A.m. Esophagogastroduodenoscopy in the near future.

## 2013-08-14 ENCOUNTER — Encounter (HOSPITAL_COMMUNITY): Payer: Self-pay | Admitting: *Deleted

## 2013-08-14 ENCOUNTER — Ambulatory Visit (HOSPITAL_COMMUNITY)
Admission: RE | Admit: 2013-08-14 | Discharge: 2013-08-14 | Disposition: A | Discharge status: Home / Self Care | Payer: Medicaid Other | Source: Ambulatory Visit | Attending: Internal Medicine | Admitting: Internal Medicine

## 2013-08-14 ENCOUNTER — Encounter (HOSPITAL_COMMUNITY)
Admission: RE | Disposition: A | Discharge status: Home / Self Care | Payer: Self-pay | Source: Ambulatory Visit | Attending: Internal Medicine

## 2013-08-14 DIAGNOSIS — R945 Abnormal results of liver function studies: Secondary | ICD-10-CM

## 2013-08-14 DIAGNOSIS — I10 Essential (primary) hypertension: Secondary | ICD-10-CM | POA: Insufficient documentation

## 2013-08-14 DIAGNOSIS — R112 Nausea with vomiting, unspecified: Secondary | ICD-10-CM

## 2013-08-14 DIAGNOSIS — K2970 Gastritis, unspecified, without bleeding: Secondary | ICD-10-CM

## 2013-08-14 DIAGNOSIS — R1013 Epigastric pain: Secondary | ICD-10-CM | POA: Insufficient documentation

## 2013-08-14 DIAGNOSIS — R140 Abdominal distension (gaseous): Secondary | ICD-10-CM

## 2013-08-14 DIAGNOSIS — K2990 Gastroduodenitis, unspecified, without bleeding: Secondary | ICD-10-CM

## 2013-08-14 HISTORY — PX: ESOPHAGOGASTRODUODENOSCOPY: SHX5428

## 2013-08-14 LAB — AST: AST: 19 U/L (ref 0–37)

## 2013-08-14 LAB — ALT: ALT: 16 U/L (ref 0–53)

## 2013-08-14 SURGERY — EGD (ESOPHAGOGASTRODUODENOSCOPY)
Anesthesia: Moderate Sedation

## 2013-08-14 MED ORDER — MIDAZOLAM HCL 5 MG/5ML IJ SOLN
INTRAMUSCULAR | Status: AC
  Filled 2013-08-14: qty 10

## 2013-08-14 MED ORDER — BUTAMBEN-TETRACAINE-BENZOCAINE 2-2-14 % EX AERO
INHALATION_SPRAY | CUTANEOUS | Status: DC | PRN
  Administered 2013-08-14: 2 via TOPICAL

## 2013-08-14 MED ORDER — MIDAZOLAM HCL 5 MG/5ML IJ SOLN
INTRAMUSCULAR | Status: DC | PRN
  Administered 2013-08-14: 2 mg via INTRAVENOUS
  Administered 2013-08-14 (×2): 3 mg via INTRAVENOUS

## 2013-08-14 MED ORDER — SIMETHICONE 40 MG/0.6ML PO SUSP
ORAL | Status: AC
  Filled 2013-08-14: qty 0.6

## 2013-08-14 MED ORDER — SODIUM CHLORIDE 0.9 % IV SOLN
INTRAVENOUS | Status: DC
  Administered 2013-08-14: 15:00:00 via INTRAVENOUS

## 2013-08-14 MED ORDER — STERILE WATER FOR IRRIGATION IR SOLN
Status: DC | PRN
  Administered 2013-08-14: 16:00:00

## 2013-08-14 MED ORDER — MEPERIDINE HCL 50 MG/ML IJ SOLN
INTRAMUSCULAR | Status: DC | PRN
  Administered 2013-08-14 (×2): 25 mg via INTRAVENOUS

## 2013-08-14 MED ORDER — MEPERIDINE HCL 50 MG/ML IJ SOLN
INTRAMUSCULAR | Status: AC
  Filled 2013-08-14: qty 1

## 2013-08-14 MED ORDER — SUCRALFATE 1 G PO TABS: 1.0000 g | ORAL_TABLET | Freq: Every day | ORAL | Status: DC

## 2013-08-14 NOTE — H&P (Addendum)
Gary Vasquez is an 62 y.o. male.   Chief Complaint: Patient is here for EGD. HPI: Patient is 37-year-old Caucasian male who presents with recurrent nausea vomiting and upper abdominal pain. He has not responded to acid suppression. He has history of hiatal hernia for which she underwent repair in 2009 along with pyloroplasty. He states he has had these symptoms since his surgery but lately they have been occurring more frequently. There is no history of hematemesis or melena and he has maintained his weight despite these symptoms. Here ultrasound in July of this year revealing single small gallstone but no evidence of wall thickening or dilated bile ducts. He also had fatty liver. LFTs were normal except for mildly elevated AST 2 months ago.  Past Medical History  Diagnosis Date  . Crohn disease   . Acid reflux   . Hypertension   . Alcoholic hepatitis   . Arthritis   . Dumping syndrome   . Paraesophageal hernia   . Chronic diarrhea   . Subdural hematoma   . Gastroparesis   . Migraine headache   . Chronic back pain   . TIA (transient ischemic attack)     Past Surgical History  Procedure Laterality Date  . Nissen fundoplication    . Colon resection Right   . Pyloroplasty  2009  . Colonoscopy N/A 07/02/2013    Procedure: COLONOSCOPY;  Surgeon: Rogene Houston, MD;  Location: [facility];  Service: Endoscopy;  Laterality: N/A;  200    Family History  Problem Relation Age of Onset  . Healthy Daughter   . Healthy Son    Social History:  reports that he has been smoking Cigarettes.  He has been smoking about 0.50 packs per day. He has never used smokeless tobacco. He reports that he drinks alcohol. He reports that he does not use illicit drugs.  Allergies:  Allergies  Allergen Reactions  . Acetaminophen Other (See Comments)    Liver issues    Medications Prior to Admission  Medication Sig Dispense Refill  . gabapentin (NEURONTIN) 100 MG capsule Take 100 mg by mouth 3  (three) times daily.      Marland Kitchen ibuprofen (ADVIL,MOTRIN) 200 MG tablet Take 400 mg by mouth every 6 (six) hours as needed for pain.      . Multiple Vitamin (MULTIVITAMIN WITH MINERALS) TABS Take 1 tablet by mouth daily.      . pantoprazole (PROTONIX) 40 MG tablet Take 1 tablet (40 mg total) by mouth daily.  30 tablet  5  . ranitidine (ZANTAC) 150 MG tablet Take 1 tablet (150 mg total) by mouth 2 (two) times daily.  60 tablet  5  . sertraline (ZOLOFT) 50 MG tablet Take 100 mg by mouth daily.       . traZODone (DESYREL) 50 MG tablet Take 1 tablet (50 mg total) by mouth at bedtime.  30 tablet  0  . loperamide (IMODIUM A-D) 2 MG tablet Take 0.5 tablets (1 mg total) by mouth daily.  30 tablet  0  . sildenafil (VIAGRA) 100 MG tablet Take 1 tab up to once daily, 65mn to 4 hours before sexual activity.  30 tablet  1    No results found for this or any previous visit (from the past 48 hour(s)). No results found.  ROS  Blood pressure 136/81, pulse 87, temperature 97.7 F (36.5 C), temperature source Oral. Physical Exam  Constitutional: He appears well-developed and well-nourished.  HENT:  Mouth/Throat: Oropharynx is clear and moist.  Eyes: Conjunctivae are normal. No scleral icterus.  Neck: No thyromegaly present.  Cardiovascular: Normal rate, regular rhythm and normal heart sounds.   No murmur heard. Respiratory: Effort normal and breath sounds normal.  GI: Soft. He exhibits no mass. Tenderness: moderate midepigastric tenderness  There is no guarding.  Musculoskeletal: Edema: trace edema at left leg.  Lymphadenopathy:    He has no cervical adenopathy.  Neurological: He is alert.  Skin: Skin is warm and dry.     Assessment/Plan Recurrent nausea vomiting and upper abdominal pain. Diagnostic and possibly therapeutic EGD.  Martesha Niedermeier U 08/14/2013, 3:27 PM

## 2013-08-14 NOTE — Op Note (Signed)
EGD PROCEDURE REPORT  PATIENT:  Gary Vasquez  MR#:  [id number] Birthdate:  1951/07/15, 62 y.o., male Endoscopist:  Dr. Rogene Houston, MD Referred By:  Dr. Ebony Hail L. Clydene Laming, MD  Procedure Date: 08/14/2013  Procedure:   EGD  Indications:  Patient is 62 year old Caucasian male who presents with recurrent nausea vomiting epigastric pain and bloating. He has a complicated GI history. He underwent repair of large hiatal hernia and pyloroplasty in February 2009 secondary to  intractable GERD complicated by leak and pyloroplasty leading to gastrojejunostomy.           Informed Consent:  The risks, benefits, alternatives & imponderables which include, but are not limited to, bleeding, infection, perforation, drug reaction and potential missed lesion have been reviewed.  The potential for biopsy, lesion removal, esophageal dilation, etc. have also been discussed.  Questions have been answered.  All parties agreeable.  Please see history & physical in medical record for more information.  Medications:  Demerol 50 IV Versed 8  IV Cetacaine spray topically for oropharyngeal anesthesia  Description of procedure:  The endoscope was introduced through the mouth and advanced to the second portion of the duodenum without difficulty or limitations. The mucosal surfaces were surveyed very carefully during advancement of the scope and upon withdrawal.  Findings:  Esophagus:  Mucosa of the esophagus was normal. Small ectopic [place] of gastric type mucosa noted proximal to GE junction. GEJ:  43 cm Stomach:  There was large amount of bile in the stomach but no food debris present. Wide-open gastrojejunostomy. Mucosa at anastomosis revealed erythema and edema. No erosion or ulcer noted. Antral mucosa was unremarkable. Cosa at fundus and cardia was normal without obvious wrap. Pyloric channel was patent. Duodenum:  Bulbar and post bulbar mucosa was normal. Small bowel; both afferent chief and loops were examined  and appeared to be unremarkable.  Therapeutic/Diagnostic Maneuvers Performed:  None  Complications:  None  Impression: No evidence of erosive esophagitis. Significant amount of bile noted in gastric cavity. Gastritis predominately at the gastrojejunostomy with patent befriend and efferent loops. Patent pyloric channel.  Recommendations:  Sucralfate 2 g by mouth each bedtime. H. pylori serology. AST and ALT levels. Office visit in 12 weeks.   REHMAN,NAJEEB U  08/14/2013  4:07 PM  CC: Dr. Clydene Laming, Leisa Lenz, MD & Dr. Rayne Du ref. provider found

## 2013-08-18 ENCOUNTER — Encounter (HOSPITAL_COMMUNITY): Payer: Self-pay | Admitting: Internal Medicine

## 2013-08-20 ENCOUNTER — Other Ambulatory Visit (INDEPENDENT_AMBULATORY_CARE_PROVIDER_SITE_OTHER): Payer: Self-pay | Admitting: Internal Medicine

## 2013-08-20 MED ORDER — HYOSCYAMINE SULFATE 0.125 MG SL SUBL: 0.1250 mg | SUBLINGUAL_TABLET | Freq: Three times a day (TID) | SUBLINGUAL | Status: DC

## 2013-09-14 ENCOUNTER — Emergency Department (HOSPITAL_COMMUNITY)
Admission: EM | Admit: 2013-09-14 | Discharge: 2013-09-14 | Disposition: A | Discharge status: Home / Self Care | Payer: Medicaid Other | Attending: Emergency Medicine | Admitting: Emergency Medicine

## 2013-09-14 ENCOUNTER — Encounter (HOSPITAL_COMMUNITY): Payer: Self-pay | Admitting: Emergency Medicine

## 2013-09-14 DIAGNOSIS — Z79899 Other long term (current) drug therapy: Secondary | ICD-10-CM | POA: Insufficient documentation

## 2013-09-14 DIAGNOSIS — K219 Gastro-esophageal reflux disease without esophagitis: Secondary | ICD-10-CM | POA: Insufficient documentation

## 2013-09-14 DIAGNOSIS — M5126 Other intervertebral disc displacement, lumbar region: Secondary | ICD-10-CM | POA: Insufficient documentation

## 2013-09-14 DIAGNOSIS — M5430 Sciatica, unspecified side: Secondary | ICD-10-CM | POA: Insufficient documentation

## 2013-09-14 DIAGNOSIS — F17200 Nicotine dependence, unspecified, uncomplicated: Secondary | ICD-10-CM | POA: Insufficient documentation

## 2013-09-14 DIAGNOSIS — G8929 Other chronic pain: Secondary | ICD-10-CM | POA: Insufficient documentation

## 2013-09-14 DIAGNOSIS — G43909 Migraine, unspecified, not intractable, without status migrainosus: Secondary | ICD-10-CM | POA: Insufficient documentation

## 2013-09-14 DIAGNOSIS — I10 Essential (primary) hypertension: Secondary | ICD-10-CM | POA: Insufficient documentation

## 2013-09-14 DIAGNOSIS — M5380 Other specified dorsopathies, site unspecified: Secondary | ICD-10-CM | POA: Insufficient documentation

## 2013-09-14 DIAGNOSIS — Z8673 Personal history of transient ischemic attack (TIA), and cerebral infarction without residual deficits: Secondary | ICD-10-CM | POA: Insufficient documentation

## 2013-09-14 DIAGNOSIS — M5136 Other intervertebral disc degeneration, lumbar region: Secondary | ICD-10-CM

## 2013-09-14 DIAGNOSIS — M129 Arthropathy, unspecified: Secondary | ICD-10-CM | POA: Insufficient documentation

## 2013-09-14 DIAGNOSIS — G589 Mononeuropathy, unspecified: Secondary | ICD-10-CM | POA: Insufficient documentation

## 2013-09-14 MED ORDER — PREDNISONE 50 MG PO TABS
60.0000 mg | ORAL_TABLET | Freq: Once | ORAL | Status: AC
  Administered 2013-09-14: 23:00:00 60 mg via ORAL
  Filled 2013-09-14 (×2): qty 1

## 2013-09-14 MED ORDER — KETOROLAC TROMETHAMINE 10 MG PO TABS
10.0000 mg | ORAL_TABLET | Freq: Once | ORAL | Status: AC
  Administered 2013-09-14: 10 mg via ORAL
  Filled 2013-09-14: qty 1

## 2013-09-14 MED ORDER — METHOCARBAMOL 500 MG PO TABS
1000.0000 mg | ORAL_TABLET | Freq: Once | ORAL | Status: AC
  Administered 2013-09-14: 1000 mg via ORAL
  Filled 2013-09-14: qty 2

## 2013-09-14 MED ORDER — TRAMADOL HCL 50 MG PO TABS
100.0000 mg | ORAL_TABLET | Freq: Once | ORAL | Status: AC
  Administered 2013-09-14: 100 mg via ORAL
  Filled 2013-09-14: qty 2

## 2013-09-14 MED ORDER — MELOXICAM 7.5 MG PO TABS: ORAL_TABLET | ORAL | Status: DC

## 2013-09-14 MED ORDER — DEXAMETHASONE 6 MG PO TABS: 6.0000 mg | ORAL_TABLET | Freq: Two times a day (BID) | ORAL | Status: DC

## 2013-09-14 MED ORDER — TRAMADOL HCL 50 MG PO TABS: 50.0000 mg | ORAL_TABLET | Freq: Four times a day (QID) | ORAL | Status: DC | PRN

## 2013-09-14 NOTE — ED Provider Notes (Signed)
Medical screening examination/treatment/procedure(s) were performed by non-physician practitioner and as supervising physician I was immediately available for consultation/collaboration.  EKG Interpretation   None         Saddie Benders. Dorna Mai, MD 09/14/13 2311

## 2013-09-14 NOTE — ED Notes (Addendum)
Pt c/o back pain x 2 wks. Pt states his back went out 2 wks ago and is too stubborn to go to the doctor. Pt took valium and muscle relaxers at home. Pt also states his feet are numb.

## 2013-09-14 NOTE — ED Provider Notes (Signed)
CSN: [id number]     Arrival date & time 09/14/13  2115 History   First MD Initiated Contact with Patient 09/14/13 2220     Chief Complaint  Patient presents with  . Back Pain   (Consider location/radiation/quality/duration/timing/severity/associated sxs/prior Treatment) HPI Comments: Patient is a 63 year old male who presents to the emergency department with complaint of lower back pain. The patient states he's had problems with his back for" years" recently( 2 weeks) he has been having increasing problem of his lower back, and pain going down his left leg. He's not had loss of bowel or bladder function. He has not been diagnosed with a foot drop. No recent falls reported. No recent injury or trauma to the back. His been no high fever reported. The patient has not had any surgery on the lower back area. The patient has been having problems with neuropathy for quite some time, he is on gabapentin. He states however that his feet still feel numb. He has not seen his primary physician for several years. He is scheduled to see a pain management specialist next week.  Patient is a 63 y.o. male presenting with back pain. The history is provided by the patient.  Back Pain Associated symptoms: abdominal pain and headaches   Associated symptoms: no chest pain and no dysuria     Past Medical History  Diagnosis Date  . Crohn disease   . Acid reflux   . Hypertension   . Alcoholic hepatitis   . Arthritis   . Dumping syndrome   . Paraesophageal hernia   . Chronic diarrhea   . Subdural hematoma   . Gastroparesis   . Migraine headache   . Chronic back pain   . TIA (transient ischemic attack)    Past Surgical History  Procedure Laterality Date  . Nissen fundoplication    . Colon resection Right   . Pyloroplasty  2009  . Colonoscopy N/A 07/02/2013    Procedure: COLONOSCOPY;  Surgeon: Rogene Houston, MD;  Location: [facility];  Service: Endoscopy;  Laterality: N/A;  200  .  Esophagogastroduodenoscopy N/A 08/14/2013    Procedure: ESOPHAGOGASTRODUODENOSCOPY (EGD);  Surgeon: Rogene Houston, MD;  Location: [facility];  Service: Endoscopy;  Laterality: N/A;  300   Family History  Problem Relation Age of Onset  . Healthy Daughter   . Healthy Son    History  Substance Use Topics  . Smoking status: Current Some Day Smoker -- 0.50 packs/day    Types: Cigarettes  . Smokeless tobacco: Never Used     Comment: 2-3 cigarrettes a day  . Alcohol Use: Yes     Comment: He is drinking 3-4 drinks a day.     Review of Systems  Constitutional: Negative for activity change.       All ROS Neg except as noted in HPI  HENT: Negative for nosebleeds.   Eyes: Negative for photophobia and discharge.  Respiratory: Negative for cough, shortness of breath and wheezing.   Cardiovascular: Negative for chest pain and palpitations.  Gastrointestinal: Positive for abdominal pain. Negative for blood in stool.  Genitourinary: Negative for dysuria, frequency and hematuria.  Musculoskeletal: Positive for back pain. Negative for arthralgias and neck pain.  Skin: Negative.   Neurological: Positive for headaches. Negative for dizziness, seizures and speech difficulty.  Psychiatric/Behavioral: Negative for hallucinations and confusion.    Allergies  Acetaminophen  Home Medications   Current Outpatient Rx  Name  Route  Sig  Dispense  Refill  .  gabapentin (NEURONTIN) 100 MG capsule   Oral   Take 100 mg by mouth 3 (three) times daily.         Marland Kitchen ibuprofen (ADVIL,MOTRIN) 200 MG tablet   Oral   Take 400 mg by mouth every 6 (six) hours as needed for pain.         . Multiple Vitamin (MULTIVITAMIN WITH MINERALS) TABS   Oral   Take 1 tablet by mouth daily.         . ranitidine (ZANTAC) 150 MG tablet   Oral   Take 1 tablet (150 mg total) by mouth 2 (two) times daily.   60 tablet   5   . sertraline (ZOLOFT) 50 MG tablet   Oral   Take 100 mg by mouth daily.          .  sildenafil (VIAGRA) 100 MG tablet      Take 1 tab up to once daily, 59min to 4 hours before sexual activity.   30 tablet   1   . traZODone (DESYREL) 50 MG tablet   Oral   Take 1 tablet (50 mg total) by mouth at bedtime.   30 tablet   0    BP 113/68  Pulse 83  Temp(Src) 97.8 F (36.6 C) (Oral)  Resp 16  Ht 6' (1.829 m)  Wt 180 lb (81.647 kg)  BMI 24.41 kg/m2  SpO2 96% Physical Exam  Nursing note and vitals reviewed. Constitutional: He is oriented to person, place, and time. He appears well-developed and well-nourished.  Non-toxic appearance.  HENT:  Head: Normocephalic.  Right Ear: Tympanic membrane and external ear normal.  Left Ear: Tympanic membrane and external ear normal.  Eyes: EOM and lids are normal. Pupils are equal, round, and reactive to light.  Neck: Normal range of motion. Neck supple. Carotid bruit is not present.  Cardiovascular: Normal rate, regular rhythm, normal heart sounds, intact distal pulses and normal pulses.   Pulmonary/Chest: Breath sounds normal. No respiratory distress.  Abdominal: Soft. Bowel sounds are normal. There is no tenderness. There is no guarding.  Musculoskeletal:       Lumbar back: He exhibits decreased range of motion, tenderness, pain and spasm.  Is no palpable step off at the lumbar spine area. There is paraspinal area tenderness, left greater than right. There is some spasm of the left. There no hot areas appreciated.  Lymphadenopathy:       Head (right side): No submandibular adenopathy present.       Head (left side): No submandibular adenopathy present.    He has no cervical adenopathy.  Neurological: He is alert and oriented to person, place, and time. He has normal strength. No cranial nerve deficit or sensory deficit.  Gait is slow but steady. There is no foot drop appreciated. There no sensory deficits of the lower extremity.  Skin: Skin is warm and dry.  Psychiatric: He has a normal mood and affect. His speech is normal.     ED Course  Procedures (including critical care time) Labs Review Labs Reviewed - No data to display Imaging Review No results found.  EKG Interpretation   None       MDM  No diagnosis found. *I have reviewed nursing notes, vital signs, and all appropriate lab and imaging results for this patient.**  I have reviewed the patient's imaging from previous emergency department evaluations. The patient had an MR of the lumbar spine in July of 2014. This revealed degenerative disc disease at multiple  levels. It also revealed some arthritis of the lower back. Suspect that the patient has degenerative disc disease and possibly sciatica on the left. There no gross neurologic deficits. There is no foot drop appreciated.  Patient is encouraged to keep his appointment with the pain management specialist next week. Patient will be treated with Decadron, Mobic, and tramadol. 00 Patient also advised to use heat to his lower back. He is advised to continue his muscle relaxant.  Lenox Ahr, PA-C 09/14/13 2300

## 2013-09-14 NOTE — Discharge Instructions (Signed)
Please apply heat to your lower back. Please use Mobic and Decadron daily with food. May use Tramadol  for pain, this medication may cause drowsiness, please use with caution. May also continue your current medications. Please keep the appointment with your pain management specialist for this week. Degenerative Disk Disease Degenerative disk disease is a condition caused by the changes that occur in the cushions of the backbone (spinal disks) as you grow older. Spinal disks are soft and compressible disks located between the bones of the spine (vertebrae). They act like shock absorbers. Degenerative disk disease can affect the whole spine. However, the neck and lower back are most commonly affected. Many changes can occur in the spinal disks with aging, such as:  The spinal disks may dry and shrink.  Small tears may occur in the tough, outer covering of the disk (annulus).  The disk space may become smaller due to loss of water.  Abnormal growths in the bone (spurs) may occur. This can put pressure on the nerve roots exiting the spinal canal, causing pain.  The spinal canal may become narrowed. CAUSES  Degenerative disk disease is a condition caused by the changes that occur in the spinal disks with aging. The exact cause is not known, but there is a genetic basis for many patients. Degenerative changes can occur due to loss of fluid in the disk. This makes the disk thinner and reduces the space between the backbones. Small cracks can develop in the outer layer of the disk. This can lead to the breakdown of the disk. You are more likely to get degenerative disk disease if you are overweight. Smoking cigarettes and doing heavy work such as weightlifting can also increase your risk of this condition. Degenerative changes can start after a sudden injury. Growth of bone spurs can compress the nerve roots and cause pain.  SYMPTOMS  The symptoms vary from person to person. Some people may have no pain,  while others have severe pain. The pain may be so severe that it can limit your activities. The location of the pain depends on the part of your backbone that is affected. You will have neck or arm pain if a disk in the neck area is affected. You will have pain in your back, buttocks, or legs if a disk in the lower back is affected. The pain becomes worse while bending, reaching up, or with twisting movements. The pain may start gradually and then get worse as time passes. It may also start after a major or minor injury. You may feel numbness or tingling in the arms or legs.  DIAGNOSIS  Your caregiver will ask you about your symptoms and about activities or habits that may cause the pain. He or she may also ask about any injuries, diseases, or treatments you have had earlier. Your caregiver will examine you to check for the range of movement that is possible in the affected area, to check for strength in your extremities, and to check for sensation in the areas of the arms and legs supplied by different nerve roots. An X-ray of the spine may be taken. Your caregiver may suggest other imaging tests, such as magnetic resonance imaging (MRI), if needed.  TREATMENT  Treatment includes rest, modifying your activities, and applying ice and heat. Your caregiver may prescribe medicines to reduce your pain and may ask you to do some exercises to strengthen your back. In some cases, you may need surgery. You and your caregiver will decide on  the treatment that is best for you. HOME CARE INSTRUCTIONS   Follow proper lifting and walking techniques as advised by your caregiver.  Maintain good posture.  Exercise regularly as advised.  Perform relaxation exercises.  Change your sitting, standing, and sleeping habits as advised. Change positions frequently.  Lose weight as advised.  Stop smoking if you smoke.  Wear supportive footwear. SEEK MEDICAL CARE IF:  Your pain does not go away within 1 to 4  weeks. SEEK IMMEDIATE MEDICAL CARE IF:   Your pain is severe.  You notice weakness in your arms, hands, or legs.  You begin to lose control of your bladder or bowel movements. MAKE SURE YOU:   Understand these instructions.  Will watch your condition.  Will get help right away if you are not doing well or get worse. Document Released: 06/11/2007 Document Revised: 11/06/2011 Document Reviewed: 06/11/2007 [facility] Patient Information 2014 [place].

## 2013-09-14 NOTE — ED Notes (Addendum)
Pt reports ongoing back problems, states he "threw my back out a couple weeks ago. It is hard to walk or to reach for something. The pain goes down my left leg." Reports taking a friend's valium and some old muscle relaxers to help with the pain.

## 2013-09-24 ENCOUNTER — Emergency Department (HOSPITAL_COMMUNITY): Payer: Medicaid Other

## 2013-09-24 ENCOUNTER — Encounter (HOSPITAL_COMMUNITY): Payer: Self-pay | Admitting: Emergency Medicine

## 2013-09-24 ENCOUNTER — Emergency Department (HOSPITAL_COMMUNITY)
Admission: EM | Admit: 2013-09-24 | Discharge: 2013-09-24 | Disposition: A | Discharge status: Home / Self Care | Payer: Medicaid Other | Attending: Emergency Medicine | Admitting: Emergency Medicine

## 2013-09-24 DIAGNOSIS — M129 Arthropathy, unspecified: Secondary | ICD-10-CM | POA: Insufficient documentation

## 2013-09-24 DIAGNOSIS — I10 Essential (primary) hypertension: Secondary | ICD-10-CM | POA: Insufficient documentation

## 2013-09-24 DIAGNOSIS — IMO0002 Reserved for concepts with insufficient information to code with codable children: Secondary | ICD-10-CM | POA: Insufficient documentation

## 2013-09-24 DIAGNOSIS — G8929 Other chronic pain: Secondary | ICD-10-CM | POA: Insufficient documentation

## 2013-09-24 DIAGNOSIS — K859 Acute pancreatitis without necrosis or infection, unspecified: Secondary | ICD-10-CM | POA: Insufficient documentation

## 2013-09-24 DIAGNOSIS — Z9089 Acquired absence of other organs: Secondary | ICD-10-CM | POA: Insufficient documentation

## 2013-09-24 DIAGNOSIS — G43909 Migraine, unspecified, not intractable, without status migrainosus: Secondary | ICD-10-CM | POA: Insufficient documentation

## 2013-09-24 DIAGNOSIS — K219 Gastro-esophageal reflux disease without esophagitis: Secondary | ICD-10-CM | POA: Insufficient documentation

## 2013-09-24 DIAGNOSIS — Z9889 Other specified postprocedural states: Secondary | ICD-10-CM | POA: Insufficient documentation

## 2013-09-24 DIAGNOSIS — Z8673 Personal history of transient ischemic attack (TIA), and cerebral infarction without residual deficits: Secondary | ICD-10-CM | POA: Insufficient documentation

## 2013-09-24 DIAGNOSIS — Z791 Long term (current) use of non-steroidal anti-inflammatories (NSAID): Secondary | ICD-10-CM | POA: Insufficient documentation

## 2013-09-24 DIAGNOSIS — Z79899 Other long term (current) drug therapy: Secondary | ICD-10-CM | POA: Insufficient documentation

## 2013-09-24 DIAGNOSIS — R0602 Shortness of breath: Secondary | ICD-10-CM | POA: Insufficient documentation

## 2013-09-24 DIAGNOSIS — F17200 Nicotine dependence, unspecified, uncomplicated: Secondary | ICD-10-CM | POA: Insufficient documentation

## 2013-09-24 DIAGNOSIS — R61 Generalized hyperhidrosis: Secondary | ICD-10-CM | POA: Insufficient documentation

## 2013-09-24 LAB — COMPREHENSIVE METABOLIC PANEL
ALT: 37 U/L (ref 0–53)
AST: 30 U/L (ref 0–37)
Albumin: 4.1 g/dL (ref 3.5–5.2)
Alkaline Phosphatase: 101 U/L (ref 39–117)
BUN: 18 mg/dL (ref 6–23)
CALCIUM: 9.4 mg/dL (ref 8.4–10.5)
CO2: 25 mEq/L (ref 19–32)
CREATININE: 1.17 mg/dL (ref 0.50–1.35)
Chloride: 90 mEq/L — ABNORMAL LOW (ref 96–112)
GFR, EST AFRICAN AMERICAN: 77 mL/min — AB (ref >90)
GFR, EST NON AFRICAN AMERICAN: 66 mL/min — AB (ref >90)
GLUCOSE: 133 mg/dL — AB (ref 70–99)
Potassium: 3.9 mEq/L (ref 3.7–5.3)
SODIUM: 131 meq/L — AB (ref 137–147)
TOTAL PROTEIN: 8.4 g/dL — AB (ref 6.0–8.3)
Total Bilirubin: 1.1 mg/dL (ref 0.3–1.2)

## 2013-09-24 LAB — CBC WITH DIFFERENTIAL/PLATELET
Basophils Absolute: 0 10*3/uL (ref 0.0–0.1)
Basophils Relative: 0 % (ref 0–1)
Eosinophils Absolute: 0.1 10*3/uL (ref 0.0–0.7)
Eosinophils Relative: 0 % (ref 0–5)
HCT: 50.2 % (ref 39.0–52.0)
HEMOGLOBIN: 17.4 g/dL — AB (ref 13.0–17.0)
LYMPHS ABS: 2.5 10*3/uL (ref 0.7–4.0)
LYMPHS PCT: 17 % (ref 12–46)
MCH: 31.3 pg (ref 26.0–34.0)
MCHC: 34.7 g/dL (ref 30.0–36.0)
MCV: 90.3 fL (ref 78.0–100.0)
MONOS PCT: 9 % (ref 3–12)
Monocytes Absolute: 1.4 10*3/uL — ABNORMAL HIGH (ref 0.1–1.0)
NEUTROS ABS: 11.1 10*3/uL — AB (ref 1.7–7.7)
NEUTROS PCT: 74 % (ref 43–77)
PLATELETS: 178 10*3/uL (ref 150–400)
RBC: 5.56 MIL/uL (ref 4.22–5.81)
RDW: 15 % (ref 11.5–15.5)
WBC: 15.1 10*3/uL — AB (ref 4.0–10.5)

## 2013-09-24 LAB — LIPASE, BLOOD: LIPASE: 272 U/L — AB (ref 11–59)

## 2013-09-24 MED ORDER — PROMETHAZINE HCL 25 MG PO TABS: 25.0000 mg | ORAL_TABLET | Freq: Four times a day (QID) | ORAL | Status: DC | PRN

## 2013-09-24 MED ORDER — SODIUM CHLORIDE 0.9 % IV BOLUS (SEPSIS)
500.0000 mL | Freq: Once | INTRAVENOUS | Status: AC
  Administered 2013-09-24: 1000 mL via INTRAVENOUS

## 2013-09-24 MED ORDER — IOHEXOL 300 MG/ML  SOLN
50.0000 mL | Freq: Once | INTRAMUSCULAR | Status: AC | PRN
  Administered 2013-09-24: 50 mL via ORAL

## 2013-09-24 MED ORDER — HYDROMORPHONE HCL PF 1 MG/ML IJ SOLN
1.0000 mg | Freq: Once | INTRAMUSCULAR | Status: AC
  Administered 2013-09-24: 1 mg via INTRAVENOUS
  Filled 2013-09-24: qty 1

## 2013-09-24 MED ORDER — IOHEXOL 300 MG/ML  SOLN
100.0000 mL | Freq: Once | INTRAMUSCULAR | Status: AC | PRN
  Administered 2013-09-24: 100 mL via INTRAVENOUS

## 2013-09-24 MED ORDER — SODIUM CHLORIDE 0.9 % IV SOLN
INTRAVENOUS | Status: DC
Start: 2013-09-25 — End: 2013-09-24

## 2013-09-24 MED ORDER — ONDANSETRON HCL 4 MG/2ML IJ SOLN
4.0000 mg | Freq: Once | INTRAMUSCULAR | Status: AC
  Administered 2013-09-24: 4 mg via INTRAMUSCULAR
  Filled 2013-09-24: qty 2

## 2013-09-24 MED ORDER — HYDROMORPHONE HCL 4 MG PO TABS: 4.0000 mg | ORAL_TABLET | Freq: Four times a day (QID) | ORAL | Status: DC | PRN

## 2013-09-24 MED ORDER — ONDANSETRON 4 MG PO TBDP: 4.0000 mg | ORAL_TABLET | Freq: Three times a day (TID) | ORAL | Status: DC | PRN

## 2013-09-24 MED ORDER — ONDANSETRON HCL 4 MG/2ML IJ SOLN: 4.0000 mg | Freq: Once | INTRAMUSCULAR | Status: DC

## 2013-09-24 NOTE — Discharge Instructions (Signed)
Acute Pancreatitis Acute pancreatitis is a disease in which the pancreas becomes suddenly irritated (inflamed). The pancreas is a large gland behind your stomach. The pancreas makes enzymes that help digest food. The pancreas also makes 2 hormones that help control your blood sugar. Acute pancreatitis happens when the enzymes attack and damage the pancreas. Most attacks last a couple of days and can cause serious problems. HOME CARE  Follow your doctor's diet instructions. You may need to avoid alcohol and limit fat in your diet.  Eat small meals often.  Drink enough fluids to keep your pee (urine) clear or pale yellow.  Only take medicines as told by your doctor.  Avoid drinking alcohol if it caused your disease.  Do not smoke.  Get plenty of rest.  Check your blood sugar at home as told by your doctor.  Keep all doctor visits as told. GET HELP RIGHT AWAY IF:   You are unable to eat or keep fluids down.  Your pain becomes severe.  You have a fever or lasting symptoms for more than 2 to 3 days.  You have a fever and your symptoms suddenly get worse.  Your skin or the white part of your eyes turn yellow (jaundice).  You throw up (vomit).  You feel dizzy, or you pass out (faint).  Your blood sugar is high (over 300 mg/dL).  You do not get better as quickly as expected.  You have new or worsening symptoms.  You have lasting pain, weakness, or feel sick to your stomach (nauseous).  You get better and then have another pain attack. MAKE SURE YOU:   Understand these instructions.  Will watch your condition.  Will get help right away if you are not doing well or get worse. Document Released: 01/31/2008 Document Revised: 02/13/2012 Document Reviewed: 11/23/2011 [facility] Patient Information 2014 [place].  Recommend clear liquid diet for the next one to 2 days. Then advance to a bland diet. Return for any new or worse symptoms. Return for persistent vomiting and  not able to keep liquids down. Take pain medication and antinausea medicine as directed. Followup with your GI Dr. in the next few days.

## 2013-09-24 NOTE — ED Notes (Signed)
abd pain and nausea with dry heaves x 2 days. Denies diarrhea/constipation. lnbm yesterday.

## 2013-09-24 NOTE — ED Provider Notes (Signed)
CSN: [id number]     Arrival date & time 09/24/13  9417 History  This chart was scribed for Gary Kung, MD by Rolanda Lundborg, ED Scribe. This patient was seen in room APA06/APA06 and the patient's care was started at 7:09 AM.    Chief Complaint  Patient presents with  . Abdominal Pain   The history is provided by the patient. No language interpreter was used.   HPI Comments: Gary Vasquez is a 63 y.o. male with a h/o Crohn's, GERD, Nissen, and colon resection who presents to the Emergency Department complaining of constant, non-radiating, sharp epigastric abdominal pain with nausea and vomiting onset 2 days ago. Pt rates the severity of the pain as a 10/10. He reports a h/o similar pain when he had his abdominal surgeries. Pt states his last BM was yesterday and normal. He denies diarrhea, constipation, hematemesis.   Past Medical History  Diagnosis Date  . Crohn disease   . Acid reflux   . Hypertension   . Alcoholic hepatitis   . Arthritis   . Dumping syndrome   . Paraesophageal hernia   . Chronic diarrhea   . Subdural hematoma   . Gastroparesis   . Migraine headache   . Chronic back pain   . TIA (transient ischemic attack)    Past Surgical History  Procedure Laterality Date  . Nissen fundoplication    . Colon resection Right   . Pyloroplasty  2009  . Colonoscopy N/A 07/02/2013    Procedure: COLONOSCOPY;  Surgeon: Rogene Houston, MD;  Location: [facility];  Service: Endoscopy;  Laterality: N/A;  200  . Esophagogastroduodenoscopy N/A 08/14/2013    Procedure: ESOPHAGOGASTRODUODENOSCOPY (EGD);  Surgeon: Rogene Houston, MD;  Location: [facility];  Service: Endoscopy;  Laterality: N/A;  300   Family History  Problem Relation Age of Onset  . Healthy Daughter   . Healthy Son    History  Substance Use Topics  . Smoking status: Current Some Day Smoker -- 0.50 packs/day    Types: Cigarettes  . Smokeless tobacco: Never Used     Comment: 2-3 cigarrettes a day  .  Alcohol Use: No     Comment: history    Review of Systems  Constitutional: Positive for diaphoresis. Negative for fever and chills.  HENT: Negative for rhinorrhea and sore throat.   Eyes: Negative for visual disturbance.  Respiratory: Positive for shortness of breath. Negative for cough.   Cardiovascular: Negative for chest pain and leg swelling.  Gastrointestinal: Positive for nausea, vomiting and abdominal pain. Negative for diarrhea and constipation.  Genitourinary: Negative for dysuria and hematuria.  Musculoskeletal: Negative for back pain and neck pain.  Skin: Negative for rash.  Neurological: Negative for headaches.  Hematological: Does not bruise/bleed easily.  Psychiatric/Behavioral: Negative for confusion.    Allergies  Acetaminophen  Home Medications   Current Outpatient Rx  Name  Route  Sig  Dispense  Refill  . dexamethasone (DECADRON) 6 MG tablet   Oral   Take 1 tablet (6 mg total) by mouth 2 (two) times daily with a meal.   12 tablet   0   . gabapentin (NEURONTIN) 100 MG capsule   Oral   Take 100 mg by mouth 3 (three) times daily.         Marland Kitchen HYDROmorphone (DILAUDID) 4 MG tablet   Oral   Take 1 tablet (4 mg total) by mouth every 6 (six) hours as needed for severe pain.   [place]  tablet   0   . ibuprofen (ADVIL,MOTRIN) 200 MG tablet   Oral   Take 400 mg by mouth every 6 (six) hours as needed for pain.         . meloxicam (MOBIC) 7.5 MG tablet      1 po bid with food   12 tablet   0   . Multiple Vitamin (MULTIVITAMIN WITH MINERALS) TABS   Oral   Take 1 tablet by mouth daily.         . ondansetron (ZOFRAN ODT) 4 MG disintegrating tablet   Oral   Take 1 tablet (4 mg total) by mouth every 8 (eight) hours as needed.   12 tablet   1   . promethazine (PHENERGAN) 25 MG tablet   Oral   Take 1 tablet (25 mg total) by mouth every 6 (six) hours as needed for nausea or vomiting.   20 tablet   0   . ranitidine (ZANTAC) 150 MG tablet   Oral   Take 1  tablet (150 mg total) by mouth 2 (two) times daily.   60 tablet   5   . sertraline (ZOLOFT) 50 MG tablet   Oral   Take 100 mg by mouth daily.          . sildenafil (VIAGRA) 100 MG tablet      Take 1 tab up to once daily, 73mn to 4 hours before sexual activity.   30 tablet   1   . traMADol (ULTRAM) 50 MG tablet   Oral   Take 1 tablet (50 mg total) by mouth every 6 (six) hours as needed.   15 tablet   0   . traZODone (DESYREL) 50 MG tablet   Oral   Take 1 tablet (50 mg total) by mouth at bedtime.   30 tablet   0    BP 159/102  Pulse 102  Temp(Src) 98.1 F (36.7 C)  Resp 20  Ht 6' (1.829 m)  Wt 180 lb (81.647 kg)  BMI 24.41 kg/m2  SpO2 98% Physical Exam  Nursing note and vitals reviewed. Constitutional: He is oriented to person, place, and time. He appears well-developed and well-nourished. No distress.  HENT:  Head: Normocephalic and atraumatic.  Mucous membranes are a little dry.  Eyes: EOM are normal. No scleral icterus.  Neck: Neck supple. No tracheal deviation present.  Cardiovascular: Normal rate.   Pulmonary/Chest: Effort normal. No respiratory distress.  Abdominal: There is tenderness (diffuse). There is no guarding.  Decreased bowel sounds but present  Musculoskeletal: Normal range of motion. He exhibits no edema.  Neurological: He is alert and oriented to person, place, and time.  Skin: Skin is warm and dry.  Psychiatric: He has a normal mood and affect. His behavior is normal.    ED Course  Procedures (including critical care time) Medications  0.9 %  sodium chloride infusion (not administered)  ondansetron (ZOFRAN) injection 4 mg (not administered)  ondansetron (ZOFRAN) injection 4 mg (4 mg Intramuscular Given 09/24/13 0718)  sodium chloride 0.9 % bolus 500 mL (0 mLs Intravenous Stopped 09/24/13 0810)  HYDROmorphone (DILAUDID) injection 1 mg (1 mg Intravenous Given 09/24/13 0733)  iohexol (OMNIPAQUE) 300 MG/ML solution 50 mL (50 mLs Oral Contrast  Given 09/24/13 0738)  iohexol (OMNIPAQUE) 300 MG/ML solution 100 mL (100 mLs Intravenous Contrast Given 09/24/13 0815)    DIAGNOSTIC STUDIES: Oxygen Saturation is 98% on RA, normal by my interpretation.    COORDINATION OF CARE: 7:16 AM- Discussed treatment  plan with pt which includes CXR, CT abdomen, blood work, zofran, and pain medication. Pt agrees to plan.    Labs Review Labs Reviewed  COMPREHENSIVE METABOLIC PANEL - Abnormal; Notable for the following:    Sodium 131 (*)    Chloride 90 (*)    Glucose, Bld 133 (*)    Total Protein 8.4 (*)    GFR calc non Af Amer 66 (*)    GFR calc Af Amer 77 (*)    All other components within normal limits  LIPASE, BLOOD - Abnormal; Notable for the following:    Lipase 272 (*)    All other components within normal limits  CBC WITH DIFFERENTIAL - Abnormal; Notable for the following:    WBC 15.1 (*)    Hemoglobin 17.4 (*)    Neutro Abs 11.1 (*)    Monocytes Absolute 1.4 (*)    All other components within normal limits   Results for orders placed during the hospital encounter of 09/24/13  COMPREHENSIVE METABOLIC PANEL      Result Value Range   Sodium 131 (*) 137 - 147 mEq/L   Potassium 3.9  3.7 - 5.3 mEq/L   Chloride 90 (*) 96 - 112 mEq/L   CO2 25  19 - 32 mEq/L   Glucose, Bld 133 (*) 70 - 99 mg/dL   BUN 18  6 - 23 mg/dL   Creatinine, Ser 1.17  0.50 - 1.35 mg/dL   Calcium 9.4  8.4 - 10.5 mg/dL   Total Protein 8.4 (*) 6.0 - 8.3 g/dL   Albumin 4.1  3.5 - 5.2 g/dL   AST 30  0 - 37 U/L   ALT 37  0 - 53 U/L   Alkaline Phosphatase 101  39 - 117 U/L   Total Bilirubin 1.1  0.3 - 1.2 mg/dL   GFR calc non Af Amer 66 (*) >90 mL/min   GFR calc Af Amer 77 (*) >90 mL/min  LIPASE, BLOOD      Result Value Range   Lipase 272 (*) 11 - 59 U/L  CBC WITH DIFFERENTIAL      Result Value Range   WBC 15.1 (*) 4.0 - 10.5 K/uL   RBC 5.56  4.22 - 5.81 MIL/uL   Hemoglobin 17.4 (*) 13.0 - 17.0 g/dL   HCT 50.2  39.0 - 52.0 %   MCV 90.3  78.0 - 100.0 fL    MCH 31.3  26.0 - 34.0 pg   MCHC 34.7  30.0 - 36.0 g/dL   RDW 15.0  11.5 - 15.5 %   Platelets 178  150 - 400 K/uL   Neutrophils Relative % 74  43 - 77 %   Neutro Abs 11.1 (*) 1.7 - 7.7 K/uL   Lymphocytes Relative 17  12 - 46 %   Lymphs Abs 2.5  0.7 - 4.0 K/uL   Monocytes Relative 9  3 - 12 %   Monocytes Absolute 1.4 (*) 0.1 - 1.0 K/uL   Eosinophils Relative 0  0 - 5 %   Eosinophils Absolute 0.1  0.0 - 0.7 K/uL   Basophils Relative 0  0 - 1 %   Basophils Absolute 0.0  0.0 - 0.1 K/uL    Imaging Review Dg Chest 2 View  09/24/2013   CLINICAL DATA:  Abdominal pain common nausea  EXAM: CHEST  2 VIEW  COMPARISON:  Prior chest x-ray 03/19/2013  FINDINGS: Stable cardiac and mediastinal contours. Central bronchitic changes and diffuse mild interstitial prominence are also unchanged.  Slightly increased lingular opacity compared to prior. No pleural effusion or pneumothorax. No acute osseous abnormality. Dilated air-filled loops of bowel noted in the left upper quadrant.  IMPRESSION: 1. Small patchy lingular opacity may reflect atelectasis, or early infiltrate. 2. Dilated and air-filled loops of small bowel incidentally noted in the visualized portion of the left upper quadrant.   Electronically Signed   By: Jacqulynn Cadet M.D.   On: 09/24/2013 08:31   Ct Abdomen Pelvis W Contrast  09/24/2013   CLINICAL DATA:  Abdominal pain.  EXAM: CT ABDOMEN AND PELVIS WITH CONTRAST  TECHNIQUE: Multidetector CT imaging of the abdomen and pelvis was performed using the standard protocol following bolus administration of intravenous contrast.  CONTRAST:  132m OMNIPAQUE IOHEXOL 300 MG/ML  SOLN  COMPARISON:  CT of the abdomen and pelvis 06/26/2011.  FINDINGS: Lung Bases: Small hiatal hernia.  Abdomen/Pelvis: The appearance of the liver, gallbladder, spleen, bilateral adrenal glands and the right kidney is unremarkable. Small calcifications between the upper pole of the left kidney and the adjacent spleen. Left kidney is  otherwise unremarkable in appearance. There is some haziness in the fat around the head and uncinate process of the pancreas, as well as the adjacent second and third portions of the duodenum, suggesting acute inflammation. The body and tail of the pancreas are normal in appearance. No pancreatic ductal dilatation. Common bile duct is normal in caliber measuring 6 mm distally. No appreciable thickening of the duodenal wall. No abnormal peripancreatic fluid collections.  Status post right hemicolectomy. No significant volume of ascites. No pneumoperitoneum. No pathologic distention of small bowel. No definite lymphadenopathy identified within the abdomen or pelvis. Atherosclerosis throughout the abdominal and pelvic vasculature, without definite aneurysm. Prostate gland and urinary bladder are unremarkable in appearance.  Musculoskeletal: There are no aggressive appearing lytic or blastic lesions noted in the visualized portions of the skeleton.  IMPRESSION: 1. Subtle inflammatory changes around the head and uncinate process of the pancreas, concerning for early acute pancreatitis. At this time, the pancreatic parenchyma enhances normally. There is no pancreatic ductal dilatation, and no abnormal peripancreatic fluid collections. 2. Status post right hemicolectomy. 3. Small hiatal hernia. 4. Atherosclerosis. 5. Additional incidental findings, similar to the prior study, as above.   Electronically Signed   By: DVinnie LangtonM.D.   On: 09/24/2013 08:33    EKG Interpretation    Date/Time:  Wednesday September 24 2013 07:31:28 EST Ventricular Rate:  88 PR Interval:  120 QRS Duration: 80 QT Interval:  350 QTC Calculation: 423 R Axis:   62 Text Interpretation:  Normal sinus rhythm Normal ECG When compared with ECG of 15-Apr-2013 10:14, No significant change was found Confirmed by Hanad Leino  MD, Yon Schiffman ([id number]) on 09/24/2013 8:01:43 AM            MDM   1. Pancreatitis    Findings both lab and CT scan  consistent with pancreatitis. Patient markedly improved in the emergency department with IV fluids and antinausea medicine pain medicine. Patient feels that he can go home and continue of liquid diet. Patient does have GI followup. No complicating factors of abscess or pseudocyst.  I personally performed the services described in this documentation, which was scribed in my presence. The recorded information has been reviewed and is accurate.    SMervin Kung MD 09/24/13 [phone]

## 2013-10-11 ENCOUNTER — Emergency Department (HOSPITAL_COMMUNITY)
Admission: EM | Admit: 2013-10-11 | Discharge: 2013-10-12 | Disposition: A | Discharge status: Home / Self Care | Payer: Medicaid Other | Attending: Emergency Medicine | Admitting: Emergency Medicine

## 2013-10-11 ENCOUNTER — Emergency Department (HOSPITAL_COMMUNITY): Payer: Medicaid Other

## 2013-10-11 ENCOUNTER — Encounter (HOSPITAL_COMMUNITY): Payer: Self-pay | Admitting: Emergency Medicine

## 2013-10-11 DIAGNOSIS — G8929 Other chronic pain: Secondary | ICD-10-CM | POA: Insufficient documentation

## 2013-10-11 DIAGNOSIS — Z8673 Personal history of transient ischemic attack (TIA), and cerebral infarction without residual deficits: Secondary | ICD-10-CM | POA: Insufficient documentation

## 2013-10-11 DIAGNOSIS — G43909 Migraine, unspecified, not intractable, without status migrainosus: Secondary | ICD-10-CM | POA: Insufficient documentation

## 2013-10-11 DIAGNOSIS — G5790 Unspecified mononeuropathy of unspecified lower limb: Secondary | ICD-10-CM | POA: Insufficient documentation

## 2013-10-11 DIAGNOSIS — M549 Dorsalgia, unspecified: Secondary | ICD-10-CM

## 2013-10-11 DIAGNOSIS — M545 Low back pain, unspecified: Secondary | ICD-10-CM | POA: Insufficient documentation

## 2013-10-11 DIAGNOSIS — M129 Arthropathy, unspecified: Secondary | ICD-10-CM | POA: Insufficient documentation

## 2013-10-11 DIAGNOSIS — K219 Gastro-esophageal reflux disease without esophagitis: Secondary | ICD-10-CM | POA: Insufficient documentation

## 2013-10-11 DIAGNOSIS — Z79899 Other long term (current) drug therapy: Secondary | ICD-10-CM | POA: Insufficient documentation

## 2013-10-11 DIAGNOSIS — F17200 Nicotine dependence, unspecified, uncomplicated: Secondary | ICD-10-CM | POA: Insufficient documentation

## 2013-10-11 DIAGNOSIS — I10 Essential (primary) hypertension: Secondary | ICD-10-CM | POA: Insufficient documentation

## 2013-10-11 DIAGNOSIS — Z791 Long term (current) use of non-steroidal anti-inflammatories (NSAID): Secondary | ICD-10-CM | POA: Insufficient documentation

## 2013-10-11 MED ORDER — METHOCARBAMOL 500 MG PO TABS
500.0000 mg | ORAL_TABLET | Freq: Once | ORAL | Status: AC
  Administered 2013-10-11: 500 mg via ORAL
  Filled 2013-10-11: qty 1

## 2013-10-11 MED ORDER — KETOROLAC TROMETHAMINE 60 MG/2ML IM SOLN
60.0000 mg | Freq: Once | INTRAMUSCULAR | Status: AC
  Administered 2013-10-11: 60 mg via INTRAMUSCULAR
  Filled 2013-10-11: qty 2

## 2013-10-11 NOTE — ED Provider Notes (Signed)
CSN: [id number]     Arrival date & time 10/11/13  2238 History   This chart was scribed for [name], MD, by Neta Ehlers, ED Scribe. This patient was seen in room APA06/APA06 and the patient's care was started at 11:19 PM. First MD Initiated Contact with Patient 10/11/13 2318     Chief Complaint  Patient presents with  . Back Pain    The history is provided by the patient. No language interpreter was used.   HPI Comments: Gary Vasquez is a 63 y.o. male who presents to the Emergency Department complaining of a month of low back pain which radiates to his shoulder blades intermittently and which he characterizes as "stabbing and burning." He reports a h/o C5 and C7 bulging disc. The pt also reports the back pain is increased with certain movements such as bending forward or twisting; he states he no longer bends forward to tie his shoes. He denies performing strenuous movements recently.  He also denies any urinary incontinence or dysuria. The pt has not seen a neurologist or a spinal specialist re his back. He has not had any imaging of his back performed since July 2013; he reports the current back pain is similar to the past pain. He used Xanax and three alcoholic beverages today to treat the pain with good relief of his sx. Last week he was prescribed Dilaudid; he states relief with the medication, though he reports he did not finish the medication because they cause him hallucinations. The pt does not use Tylenol.   He has no hx of Ca, denies IVDU or fevers and has relief with certain positions such as laying down.    The pt also reports a h/o chronic GI issues including Chron's, a colon resection, pancreatitis, and gall-stones. 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 He denies DM.   He has no abdominal sx at this time.  He reports he has been seeing Dr. Clydene Laming for his PCP.  Past Medical History  Diagnosis Date  . Crohn disease   . Acid reflux   . Hypertension   .  Alcoholic hepatitis   . Arthritis   . Dumping syndrome   . Paraesophageal hernia   . Chronic diarrhea   . Subdural hematoma   . Gastroparesis   . Migraine headache   . Chronic back pain   . TIA (transient ischemic attack)    Past Surgical History  Procedure Laterality Date  . Nissen fundoplication    . Colon resection Right   . Pyloroplasty  2009  . Colonoscopy N/A 07/02/2013    Procedure: COLONOSCOPY;  Surgeon: Rogene Houston, MD;  Location: [facility];  Service: Endoscopy;  Laterality: N/A;  200  . Esophagogastroduodenoscopy N/A 08/14/2013    Procedure: ESOPHAGOGASTRODUODENOSCOPY (EGD);  Surgeon: Rogene Houston, MD;  Location: [facility];  Service: Endoscopy;  Laterality: N/A;  300   Family History  Problem Relation Age of Onset  . Healthy Daughter   . Healthy Son    History  Substance Use Topics  . Smoking status: Current Every Day Smoker -- 0.25 packs/day    Types: Cigarettes  . Smokeless tobacco: Never Used     Comment: 2-3 cigarrettes a day  . Alcohol Use: 0.0 oz/week    3-4 drink(s) per week    Review of Systems  A complete 10 system review of systems was obtained, and all systems were negative except where indicated in the HPI and PE.  Allergies  Acetaminophen  Home Medications   Current Outpatient Rx  Name  Route  Sig  Dispense  Refill  . dexamethasone (DECADRON) 6 MG tablet   Oral   Take 1 tablet (6 mg total) by mouth 2 (two) times daily with a meal.   12 tablet   0   . gabapentin (NEURONTIN) 100 MG capsule   Oral   Take 100 mg by mouth 3 (three) times daily.         Marland Kitchen HYDROmorphone (DILAUDID) 4 MG tablet   Oral   Take 1 tablet (4 mg total) by mouth every 6 (six) hours as needed for severe pain.   20 tablet   0   . ibuprofen (ADVIL,MOTRIN) 200 MG tablet   Oral   Take 400 mg by mouth every 6 (six) hours as needed for pain.         . meloxicam (MOBIC) 7.5 MG tablet      1 po bid with food   12 tablet   0   . meloxicam  (MOBIC) 7.5 MG tablet   Oral   Take 2 tablets (15 mg total) by mouth daily.   30 tablet   0   . methocarbamol (ROBAXIN) 500 MG tablet   Oral   Take 1 tablet (500 mg total) by mouth 2 (two) times daily as needed for muscle spasms.   20 tablet   0   . Multiple Vitamin (MULTIVITAMIN WITH MINERALS) TABS   Oral   Take 1 tablet by mouth daily.         . ondansetron (ZOFRAN ODT) 4 MG disintegrating tablet   Oral   Take 1 tablet (4 mg total) by mouth every 8 (eight) hours as needed.   12 tablet   1   . promethazine (PHENERGAN) 25 MG tablet   Oral   Take 1 tablet (25 mg total) by mouth every 6 (six) hours as needed for nausea or vomiting.   20 tablet   0   . ranitidine (ZANTAC) 150 MG tablet   Oral   Take 1 tablet (150 mg total) by mouth 2 (two) times daily.   60 tablet   5   . sertraline (ZOLOFT) 50 MG tablet   Oral   Take 100 mg by mouth daily.          . sildenafil (VIAGRA) 100 MG tablet      Take 1 tab up to once daily, 26mn to 4 hours before sexual activity.   30 tablet   1   . traMADol (ULTRAM) 50 MG tablet   Oral   Take 1 tablet (50 mg total) by mouth every 6 (six) hours as needed.   15 tablet   0   . traZODone (DESYREL) 50 MG tablet   Oral   Take 1 tablet (50 mg total) by mouth at bedtime.   30 tablet   0    Triage Vitals: BP 130/83  Pulse 87  Temp(Src) 98.1 F (36.7 C) (Oral)  Resp 16  Ht 6' 1"  (1.854 m)  Wt 186 lb (84.369 kg)  BMI 24.55 kg/m2  SpO2 98%  Physical Exam  Nursing note and vitals reviewed. Constitutional: He is oriented to person, place, and time. He appears well-developed and well-nourished. No distress.  HENT:  Head: Normocephalic and atraumatic.  Eyes: EOM are normal.  Neck: Neck supple. No tracheal deviation present.  Cardiovascular: Normal rate.   Pulmonary/Chest: Effort normal. No respiratory distress.  Abdominal: There is  tenderness.  Mild epigastric tenderness.   Musculoskeletal: Normal range of motion. He  exhibits no edema.  T-9 and T-10 tenderness. Midline, mild.   Neurological: He is alert and oriented to person, place, and time.  Normal strength, sensation, and ROM.  Normal strength and sensation without exacerbation of back pain.   Skin: Skin is warm and dry.  Psychiatric: He has a normal mood and affect. His behavior is normal.    ED Course  Procedures (including critical care time)  DIAGNOSTIC STUDIES: Oxygen Saturation is 98% on room air, normal by my interpretation.    COORDINATION OF CARE:  11:31 PM- Discussed treatment plan with patient, which includes imaging, and the patient agreed to the plan.   Labs Review Labs Reviewed - No data to display Imaging Review Dg Thoracic Spine W/swimmers  10/12/2013   CLINICAL DATA:  63 year old male with pain at the T8-T9 level. Initial encounter.  EXAM: THORACIC SPINE - 2 VIEW + SWIMMERS  COMPARISON:  Chest radiographs 09/24/2013.  FINDINGS: Thoracic segmentation appears to be normal. Stable and normal thoracic vertebral height and alignment. Relatively preserved disc spaces. Cervicothoracic junction alignment is within normal limits. Chronic C5-C6 and C6-C7 disc and endplate degeneration. Grossly normal visualized thoracic visceral contours.  IMPRESSION: No acute osseous abnormality identified in the thoracic spine.   Electronically Signed   By: Lars Pinks M.D.   On: 10/12/2013 00:37   Dg Lumbar Spine Complete  10/12/2013   CLINICAL DATA:  63 year old male with lumbar spine pain. Initial encounter. Crohn disease.  EXAM: LUMBAR SPINE - COMPLETE 4+ VIEW  COMPARISON:  CT Abdomen and Pelvis 09/24/2013.  FINDINGS: Right of midline abdominal skin staples re- identified. Normal lumbar segmentation. Stable vertebral height and alignment. Chronic L5-S1 disc space loss and endplate degeneration. Aortoiliac calcified atherosclerosis noted. No pars fracture. sacral ala and SI joints within normal limits.  IMPRESSION: 1.  No acute osseous abnormality  identified in the lumbar spine. 2. Postoperative changes to the right abdomen re- identified. 3. Chronic L5-S1 disc and endplate degeneration.   Electronically Signed   By: Lars Pinks M.D.   On: 10/12/2013 00:38    EKG Interpretation   None       MDM   Final diagnoses:  Back pain    The pt has no sx in his legs, he can straight leg raise to 90 deg without pain and has no weakness.  He has some reproducible ttp over his lower T spine and his paraspinal muscle and reports relief with the xanax and ETOH he took earlier this evening - He does not appear to be in distress, he has xrays which show no acute fractures or tumors, tx here with toradol and robaxin - will likely rx same for home.  He can f/u in outpt setting  Meds given in ED:  Medications  ketorolac (TORADOL) injection 60 mg (60 mg Intramuscular Given 10/11/13 2337)  methocarbamol (ROBAXIN) tablet 500 mg (500 mg Oral Given 10/11/13 2337)    Discharge Medication List as of 10/12/2013 12:46 AM    START taking these medications   Details  !! meloxicam (MOBIC) 7.5 MG tablet Take 2 tablets (15 mg total) by mouth daily., Starting 10/12/2013, Until Discontinued, Print    methocarbamol (ROBAXIN) 500 MG tablet Take 1 tablet (500 mg total) by mouth 2 (two) times daily as needed for muscle spasms., Starting 10/12/2013, Until Discontinued, Print     !! - Potential duplicate medications found. Please discuss with provider.  I personally performed the services described in this documentation, which was scribed in my presence. The recorded information has been reviewed and is accurate.    [name], MD 10/12/13 [phone]

## 2013-10-11 NOTE — ED Notes (Signed)
C/O cervical spine and lumbar spine pain.  Has been seen multiple times for same complaint in addition to pancreatitis issues according to patient.  States "they shoot me up with pain medication and send me home."  Admits to having 3 alcoholic beverages tonight to "self-medicate" for the pain.

## 2013-10-12 MED ORDER — MELOXICAM 7.5 MG PO TABS: 15.0000 mg | ORAL_TABLET | Freq: Every day | ORAL | Status: DC

## 2013-10-12 MED ORDER — METHOCARBAMOL 500 MG PO TABS: 500.0000 mg | ORAL_TABLET | Freq: Two times a day (BID) | ORAL | Status: DC | PRN

## 2013-10-12 NOTE — Discharge Instructions (Signed)
Back Pain:   Your back pain should be treated with medicines such as ibuprofen or aleve and this back pain should get better over the next 2 weeks.  However if you develop severe or worsening pain, low back pain with fever, numbness, weakness or inability to walk or urinate, you should return to the ER immediately.  Please follow up with your doctor this week for a recheck if still having symptoms. Low back pain is discomfort in the lower back that may be due to injuries to muscles and ligaments around the spine.  Occasionally, it may be caused by a a problem to a part of the spine called a disc.  The pain may last several days or a week;  However, most patients get completely well in 4 weeks.  Self - care:  The application of heat can help soothe the pain.  Maintaining your daily activities, including walking, is encourged, as it will help you get better faster than just staying in bed.  Medications are also useful to help with pain control.  A commonly prescribed medications includes acetaminophen.  This medication is generally safe, though you should not take more than 8 of the extra strength (528m) pills a day.  Non steroidal anti inflammatory medications including Ibuprofen and naproxen;  These medications help both pain and swelling and are very useful in treating back pain.  They should be taken with food, as they can cause stomach upset, and more seriously, stomach bleeding.    Muscle relaxants:  These medications can help with muscle tightness that is a cause of lower back pain.  Most of these medications can cause drowsiness, and it is not safe to drive or use dangerous machinery while taking them.  You will need to follow up with  Your primary healthcare provider in 1-2 weeks for reassessment.  Be aware that if you develop new symptoms, such as a fever, leg weakness, difficulty with or loss of control of your urine or bowels, abdominal pain, or more severe pain, you will need to seek  medical attention and  / or return to the Emergency department.  If you do not have a doctor see the list below.  RESOURCE GUIDE  Chronic Pain Problems: Contact [facility]  [phone] need to be referred by their primary care doctor.  Insufficient Money for Medicine: Contact [employer] Way:  call "211" or [phone]  No Primary Care Doctor: - Call [facility] Connect  [phone]- can help you locate a primary care doctor that  accepts your insurance, provides certain services, etc. - Physician Referral [phone] Agencies that provide inexpensive medical care: - [facility]  [facility]  [phone]- [facility]  [phone]- [facility] [phone]- Planned [facility]  [facility] [phone] MBurketProviders: - [facility]- [address], [address]  [phone] Mon-Fri 9am-7pm, Sat 9am-1pm - [facility] [address] [address] [address] [address] [address], [phone]  Only accepts CKentuckyAccess MFloridapatients after they have their name  applied to their card  Self Pay (no insurance) in [place] - Sickle Cell Patients: Dr EKevan Ny [facility]  [place], [facility] [place] [facility] [place]- [address], [facility]- see information above (Speak to [employer] if you do not have insurance)       -  [facility]- [place], [place],  [place],  [place]  Dr Vista Lawman-  [address], [place], [facility]- [address], [phone]       -  [facility] [place]- [address], [place], also [address] [phone]       -    [facility]- [address], [place], 1st & 3rd Saturday   every month, 10am-1pm  1) Find a Doctor and Pay Out of Pocket Although you won't have to find out who is covered by your insurance plan, it is a good idea to ask around and get recommendations. You will then need to call the office and see if the doctor you have chosen will accept you as a new patient and what types of options they offer for patients who are self-pay. Some doctors offer discounts or will set up payment plans for their patients who do not have insurance, but you will need to ask so you aren't surprised when you get to your appointment.  2) Contact Your Local Health Department Not all health departments have doctors that can see patients for sick visits, but many do, so it is worth a call to see if yours does. If you don't know where your local health department is, you can check in your phone book. The CDC also has a tool to help you locate your state's health department, and many state websites also have listings of all of their local health departments.  3) Find a [facility] If your illness is not likely to be very severe or complicated, you may want to try a walk in clinic. These are popping up all over the country in pharmacies, drugstores, and shopping centers. They're usually staffed by nurse practitioners or physician assistants that have been trained to treat common illnesses and complaints. They're usually fairly quick and inexpensive. However, if you have serious medical issues or chronic medical problems, these are probably not your best option  STD Testing - [place], [facility], [address] [place], phone [phone] or [phone].  Monday - Friday, call for an appointment. - [place], [facility], [address], [place], phone [phone] or [phone].  Monday - Friday, call for an appointment.  Abuse/Neglect: - [place] [phone] - [place] [phone] (After Hours)  Emergency Shelter:  [facility] [phone]  Maternity Homes: - [facility] [place] [phone] - [place] [phone]  MRSA Hotline #:   (626)675-7057  [facility]. [address]         [place] Phone:  [phone]                                  Phone:  [phone]                   Phone:  [phone]  [place], [place] in [place], [address],                                  [place] [phone] or [phone] (After Hours)   [place]  Substance Abuse Resources: - Alcohol and Drug Services  [phone] - [place] [phone] - The [place] [phone] - Residential & Outpatient Substance Abuse Program  [phone]  Psychological Services: - [place]  [place]  [place], [phone] [place]. [address], [place], [place]: [phone] or [phone], PicCapture.uy  Dental Assistance  If unable to pay or  uninsured, contact:  [facility] or [facility]. to become qualified for the adult dental clinic.  Patients with Medicaid: [facility] [phone] [address], [place] [address], [phone]  If unable to pay, or uninsured, contact [facility] [phone]) or [place] [phone] in [place], [place] in [facility]) to become qualified for the adult dental clinic  Other Accident- [place], [place], [place], [address], [place], [place], 2nd and 4th Thursday of the month at 6:30am.  10 clients each day by appointment, can sometimes see walk-in patients if someone does not show for an appointment. [facility]- [address] [place], [place], [address], [place], [place], [place], [address], [place] Department- [place] Department- [place] Department- [phone]

## 2013-10-12 NOTE — ED Provider Notes (Signed)
Prior to discharge, the patient was able to ambulate up and down the hallway without difficulty.  [name], MD 10/12/13 [phone]

## 2013-10-12 NOTE — ED Notes (Signed)
Patient with no complaints at this time. Respirations even and unlabored. Skin warm/dry. Discharge instructions reviewed with patient at this time. Patient given opportunity to voice concerns/ask questions. Patient discharged at this time and left Emergency Department with steady gait.   

## 2013-11-27 ENCOUNTER — Telehealth: Payer: Self-pay | Admitting: *Deleted

## 2013-11-27 NOTE — Telephone Encounter (Signed)
Message copied by [facility], Louis Meckel on Thu Nov 27, 2013  4:00 PM ------      Message from: Doran Heater      Created: Wed Nov 26, 2013  8:14 PM       Please make sure pt has followed up with neuro or pulm - he should have by now. Sleep apnea. thanks ------

## 2013-11-27 NOTE — Telephone Encounter (Signed)
Nurse called pt to f/u and he stated that he does have plans to f/u with neuro but has had a lot going on and will schedule an appointment.

## 2013-12-15 ENCOUNTER — Encounter (INDEPENDENT_AMBULATORY_CARE_PROVIDER_SITE_OTHER): Payer: Self-pay | Admitting: Internal Medicine

## 2013-12-15 ENCOUNTER — Ambulatory Visit (INDEPENDENT_AMBULATORY_CARE_PROVIDER_SITE_OTHER): Payer: Medicaid Other | Admitting: Internal Medicine

## 2013-12-15 VITALS — BP 118/80 | HR 84 | Temp 97.9°F | Resp 18 | Ht 72.0 in | Wt 171.0 lb

## 2013-12-15 DIAGNOSIS — R197 Diarrhea, unspecified: Secondary | ICD-10-CM

## 2013-12-15 DIAGNOSIS — K5090 Crohn's disease, unspecified, without complications: Secondary | ICD-10-CM

## 2013-12-15 DIAGNOSIS — R112 Nausea with vomiting, unspecified: Secondary | ICD-10-CM

## 2013-12-15 DIAGNOSIS — G4700 Insomnia, unspecified: Secondary | ICD-10-CM

## 2013-12-15 MED ORDER — LORAZEPAM 1 MG PO TABS: 1.0000 mg | ORAL_TABLET | Freq: Every day | ORAL | Status: DC

## 2013-12-15 NOTE — Patient Instructions (Signed)
Please ask Dr. Merlene Laughter to send [place] copy of  office notes

## 2013-12-15 NOTE — Progress Notes (Signed)
Presenting complaint;  followup for N/V and diarrhea. Last OV 12/1502014.  Subjective:  Patient returns for followup visit. He says he's not any better. He has at least 2 or 3 episodes of vomiting a day. He did not see any improvement with sucralfate which he stopped after one month. He vomited this morning after eating breakfast. Vomitus is usually bothered and food debris. He denies hematemesis. He continues to complain of intermittent epigastric pain and diarrhea. He has had these symptoms since his surgery in February 2009. He underwent repair of large hiatal hernia and pyloroplasty which was complicated by pyloroplasty leak and subsequent gastrojejunostomy. He continues to drink alcohol. He says he has a few beers every week. He was seen in emergency room about 3 months ago for acute pancreatitis. He was treated and discharged. He states heartburn is well controlled with Zantac. He has diarrhea at least 3-4 times a week and each time he has 3-4 loose stools per day. He denies melena or rectal bleeding. He also complains of back pain, leg pain and insomnia. He presently does not have primary care physician as Dr. Sherral Hammers has left.     Current Medications: Outpatient Encounter Prescriptions as of 12/15/2013  Medication Sig  . gabapentin (NEURONTIN) 100 MG capsule Take 100 mg by mouth 3 (three) times daily.  . Multiple Vitamin (MULTIVITAMIN WITH MINERALS) TABS Take 1 tablet by mouth daily.  . ranitidine (ZANTAC) 150 MG tablet Take 1 tablet (150 mg total) by mouth 2 (two) times daily.  . sertraline (ZOLOFT) 50 MG tablet Take 100 mg by mouth daily.   . [DISCONTINUED] dexamethasone (DECADRON) 6 MG tablet Take 1 tablet (6 mg total) by mouth 2 (two) times daily with a meal.  . [DISCONTINUED] HYDROmorphone (DILAUDID) 4 MG tablet Take 1 tablet (4 mg total) by mouth every 6 (six) hours as needed for severe pain.  . [DISCONTINUED] ibuprofen (ADVIL,MOTRIN) 200 MG tablet Take 400 mg by mouth every 6 (six)  hours as needed for pain.  . [DISCONTINUED] meloxicam (MOBIC) 7.5 MG tablet 1 po bid with food  . [DISCONTINUED] meloxicam (MOBIC) 7.5 MG tablet Take 2 tablets (15 mg total) by mouth daily.  . [DISCONTINUED] methocarbamol (ROBAXIN) 500 MG tablet Take 1 tablet (500 mg total) by mouth 2 (two) times daily as needed for muscle spasms.  . [DISCONTINUED] ondansetron (ZOFRAN ODT) 4 MG disintegrating tablet Take 1 tablet (4 mg total) by mouth every 8 (eight) hours as needed.  . [DISCONTINUED] promethazine (PHENERGAN) 25 MG tablet Take 1 tablet (25 mg total) by mouth every 6 (six) hours as needed for nausea or vomiting.  . [DISCONTINUED] sildenafil (VIAGRA) 100 MG tablet Take 1 tab up to once daily, 16mn to 4 hours before sexual activity.  . [DISCONTINUED] traMADol (ULTRAM) 50 MG tablet Take 1 tablet (50 mg total) by mouth every 6 (six) hours as needed.  . [DISCONTINUED] traZODone (DESYREL) 50 MG tablet Take 1 tablet (50 mg total) by mouth at bedtime.     Objective: Blood pressure 118/80, pulse 84, temperature 97.9 F (36.6 C), temperature source Oral, resp. rate 18, height 6' (1.829 m), weight 171 lb (77.565 kg). Patient is alert and in no acute distress. Conjunctiva is pink. Sclera is nonicteric Oropharyngeal mucosa is normal. No neck masses or thyromegaly noted. Cardiac exam with regular rhythm normal S1 and S2. No murmur or gallop noted. Lungs are clear to auscultation. Abdomen symmetrical. Bowel sounds are normal. On palpation abdomen is soft with mild epigastric tenderness. No organomegaly  or masses.  No LE edema or clubbing noted.  Labs/studies Results: Abdominopelvic CT from 09/24/2013 reviewed; Which showed mild inflammatory changes involving uncinate process and head of pancreas; small sliding hiatal hernia and evidence of right hemicolectomy. Lab studies from 09/24/2013 also reviewed; LFTs were normal; Serum lipase was 272 and WBC 15.1   Assessment:  #1. N/V.This is a chronic  symptom most likely secondary to bile reflux. Continued alcohol use is not helping. He must be keeping enough food down to only have lost 3 lbs. #2. 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 #3. Insomnia. He has been on Ambien before. #4. H/O pancreatitis. He was seen in ER three months ago. He must quit drinking alcohol.   Plan:  Lorazepam 1 mg po qhs. Patient advised to quit drinking alcohol. Possible referral to [facility] for consideration of Roux-en-Y anastomosis but first I would like for him to quit drinking alcohol in see if his symptoms resolve. Office visit in 4 months.

## 2013-12-18 ENCOUNTER — Telehealth (INDEPENDENT_AMBULATORY_CARE_PROVIDER_SITE_OTHER): Payer: Self-pay | Admitting: *Deleted

## 2013-12-18 NOTE — Telephone Encounter (Signed)
Patient l/m stating you wanted him to see Dr Merlene Laughter -- his PCP can't make referral because PCP has left the practice ([facility]) -- he wants to know if we can make referral, if so what is diagnosis -- also Lorazepam you gave him has helped and he has slept 2 nights in a row

## 2013-12-18 NOTE — Telephone Encounter (Signed)
We can make referral for insomnia

## 2013-12-19 NOTE — Telephone Encounter (Signed)
appt w/ Dr Merlene Laughter sch'd 01/06/14 @ 1045, patient aware

## 2014-01-22 ENCOUNTER — Emergency Department (HOSPITAL_COMMUNITY): Payer: Medicaid Other

## 2014-01-22 ENCOUNTER — Inpatient Hospital Stay (HOSPITAL_COMMUNITY)
Admission: EM | Admit: 2014-01-22 | Discharge: 2014-01-24 | DRG: 392 | Disposition: A | Discharge status: Home / Self Care | Payer: Medicaid Other | Attending: Internal Medicine | Admitting: Internal Medicine

## 2014-01-22 ENCOUNTER — Encounter (HOSPITAL_COMMUNITY): Payer: Self-pay | Admitting: Emergency Medicine

## 2014-01-22 DIAGNOSIS — R0789 Other chest pain: Secondary | ICD-10-CM | POA: Diagnosis present

## 2014-01-22 DIAGNOSIS — Z79899 Other long term (current) drug therapy: Secondary | ICD-10-CM

## 2014-01-22 DIAGNOSIS — I10 Essential (primary) hypertension: Secondary | ICD-10-CM | POA: Diagnosis present

## 2014-01-22 DIAGNOSIS — M79605 Pain in left leg: Secondary | ICD-10-CM

## 2014-01-22 DIAGNOSIS — I6350 Cerebral infarction due to unspecified occlusion or stenosis of unspecified cerebral artery: Secondary | ICD-10-CM

## 2014-01-22 DIAGNOSIS — M545 Low back pain, unspecified: Secondary | ICD-10-CM

## 2014-01-22 DIAGNOSIS — Z9889 Other specified postprocedural states: Secondary | ICD-10-CM

## 2014-01-22 DIAGNOSIS — F1010 Alcohol abuse, uncomplicated: Secondary | ICD-10-CM

## 2014-01-22 DIAGNOSIS — Z886 Allergy status to analgesic agent status: Secondary | ICD-10-CM

## 2014-01-22 DIAGNOSIS — F17200 Nicotine dependence, unspecified, uncomplicated: Secondary | ICD-10-CM | POA: Diagnosis present

## 2014-01-22 DIAGNOSIS — R7401 Elevation of levels of liver transaminase levels: Secondary | ICD-10-CM

## 2014-01-22 DIAGNOSIS — R197 Diarrhea, unspecified: Secondary | ICD-10-CM

## 2014-01-22 DIAGNOSIS — K859 Acute pancreatitis without necrosis or infection, unspecified: Secondary | ICD-10-CM

## 2014-01-22 DIAGNOSIS — R740 Nonspecific elevation of levels of transaminase and lactic acid dehydrogenase [LDH]: Secondary | ICD-10-CM

## 2014-01-22 DIAGNOSIS — K5090 Crohn's disease, unspecified, without complications: Secondary | ICD-10-CM | POA: Diagnosis present

## 2014-01-22 DIAGNOSIS — K3184 Gastroparesis: Secondary | ICD-10-CM | POA: Diagnosis present

## 2014-01-22 DIAGNOSIS — Z8719 Personal history of other diseases of the digestive system: Secondary | ICD-10-CM

## 2014-01-22 DIAGNOSIS — K219 Gastro-esophageal reflux disease without esophagitis: Principal | ICD-10-CM | POA: Diagnosis present

## 2014-01-22 DIAGNOSIS — G8194 Hemiplegia, unspecified affecting left nondominant side: Secondary | ICD-10-CM

## 2014-01-22 DIAGNOSIS — G8929 Other chronic pain: Secondary | ICD-10-CM

## 2014-01-22 DIAGNOSIS — J441 Chronic obstructive pulmonary disease with (acute) exacerbation: Secondary | ICD-10-CM

## 2014-01-22 DIAGNOSIS — J449 Chronic obstructive pulmonary disease, unspecified: Secondary | ICD-10-CM | POA: Diagnosis present

## 2014-01-22 DIAGNOSIS — Z8673 Personal history of transient ischemic attack (TIA), and cerebral infarction without residual deficits: Secondary | ICD-10-CM

## 2014-01-22 DIAGNOSIS — M129 Arthropathy, unspecified: Secondary | ICD-10-CM | POA: Diagnosis present

## 2014-01-22 DIAGNOSIS — F411 Generalized anxiety disorder: Secondary | ICD-10-CM

## 2014-01-22 DIAGNOSIS — Z8249 Family history of ischemic heart disease and other diseases of the circulatory system: Secondary | ICD-10-CM

## 2014-01-22 DIAGNOSIS — K911 Postgastric surgery syndromes: Secondary | ICD-10-CM

## 2014-01-22 DIAGNOSIS — M5126 Other intervertebral disc displacement, lumbar region: Secondary | ICD-10-CM | POA: Diagnosis present

## 2014-01-22 DIAGNOSIS — J4489 Other specified chronic obstructive pulmonary disease: Secondary | ICD-10-CM | POA: Diagnosis present

## 2014-01-22 DIAGNOSIS — E46 Unspecified protein-calorie malnutrition: Secondary | ICD-10-CM

## 2014-01-22 DIAGNOSIS — K709 Alcoholic liver disease, unspecified: Secondary | ICD-10-CM

## 2014-01-22 DIAGNOSIS — R112 Nausea with vomiting, unspecified: Secondary | ICD-10-CM

## 2014-01-22 DIAGNOSIS — J690 Pneumonitis due to inhalation of food and vomit: Secondary | ICD-10-CM

## 2014-01-22 HISTORY — DX: Essential (primary) hypertension: I10

## 2014-01-22 LAB — TROPONIN I: Troponin I: <0.3 ng/mL (ref <0.30)

## 2014-01-22 LAB — CBC WITH DIFFERENTIAL/PLATELET
Basophils Absolute: 0 10*3/uL (ref 0.0–0.1)
Basophils Relative: 0 % (ref 0–1)
EOS PCT: 1 % (ref 0–5)
Eosinophils Absolute: 0.1 10*3/uL (ref 0.0–0.7)
HEMATOCRIT: 45.2 % (ref 39.0–52.0)
HEMOGLOBIN: 14.7 g/dL (ref 13.0–17.0)
LYMPHS ABS: 2.4 10*3/uL (ref 0.7–4.0)
LYMPHS PCT: 33 % (ref 12–46)
MCH: 29.8 pg (ref 26.0–34.0)
MCHC: 32.5 g/dL (ref 30.0–36.0)
MCV: 91.7 fL (ref 78.0–100.0)
MONO ABS: 0.7 10*3/uL (ref 0.1–1.0)
MONOS PCT: 10 % (ref 3–12)
Neutro Abs: 4.1 10*3/uL (ref 1.7–7.7)
Neutrophils Relative %: 56 % (ref 43–77)
Platelets: 190 10*3/uL (ref 150–400)
RBC: 4.93 MIL/uL (ref 4.22–5.81)
RDW: 13.3 % (ref 11.5–15.5)
WBC: 7.3 10*3/uL (ref 4.0–10.5)

## 2014-01-22 LAB — BASIC METABOLIC PANEL
BUN: 13 mg/dL (ref 6–23)
CALCIUM: 8.9 mg/dL (ref 8.4–10.5)
CO2: 21 meq/L (ref 19–32)
CREATININE: 1.04 mg/dL (ref 0.50–1.35)
Chloride: 99 mEq/L (ref 96–112)
GFR calc Af Amer: 88 mL/min — ABNORMAL LOW (ref >90)
GFR calc non Af Amer: 76 mL/min — ABNORMAL LOW (ref >90)
Glucose, Bld: 132 mg/dL — ABNORMAL HIGH (ref 70–99)
Potassium: 3.9 mEq/L (ref 3.7–5.3)
Sodium: 137 mEq/L (ref 137–147)

## 2014-01-22 LAB — I-STAT TROPONIN, ED: Troponin i, poc: 0 ng/mL (ref 0.00–0.08)

## 2014-01-22 MED ORDER — ONDANSETRON HCL 4 MG/2ML IJ SOLN
4.0000 mg | Freq: Once | INTRAMUSCULAR | Status: AC
  Administered 2014-01-22: 4 mg via INTRAVENOUS
  Filled 2014-01-22: qty 2

## 2014-01-22 MED ORDER — NITROGLYCERIN 0.4 MG SL SUBL
SUBLINGUAL_TABLET | SUBLINGUAL | Status: AC
  Filled 2014-01-22: qty 2.5

## 2014-01-22 MED ORDER — IOHEXOL 350 MG/ML SOLN
100.0000 mL | Freq: Once | INTRAVENOUS | Status: AC | PRN
  Administered 2014-01-22: 100 mL via INTRAVENOUS

## 2014-01-22 MED ORDER — MORPHINE SULFATE 4 MG/ML IJ SOLN
4.0000 mg | Freq: Once | INTRAMUSCULAR | Status: AC
  Administered 2014-01-22: 4 mg via INTRAVENOUS
  Filled 2014-01-22: qty 1

## 2014-01-22 MED ORDER — ASPIRIN 81 MG PO CHEW
324.0000 mg | CHEWABLE_TABLET | Freq: Once | ORAL | Status: AC
  Administered 2014-01-22: 324 mg via ORAL
  Filled 2014-01-22: qty 4

## 2014-01-22 MED ORDER — NITROGLYCERIN 0.4 MG SL SUBL
0.4000 mg | SUBLINGUAL_TABLET | Freq: Once | SUBLINGUAL | Status: AC
Start: 2014-01-22 — End: 2014-01-22
  Administered 2014-01-22: 0.4 mg via SUBLINGUAL

## 2014-01-22 MED ORDER — NITROGLYCERIN 0.4 MG SL SUBL: 0.4000 mg | SUBLINGUAL_TABLET | Freq: Once | SUBLINGUAL | Status: DC

## 2014-01-22 MED ORDER — NITROGLYCERIN IN D5W 200-5 MCG/ML-% IV SOLN
5.0000 ug/min | Freq: Once | INTRAVENOUS | Status: AC
  Administered 2014-01-22: 5 ug/min via INTRAVENOUS
  Filled 2014-01-22: qty 250

## 2014-01-22 MED ORDER — NITROGLYCERIN 0.4 MG SL SUBL
0.4000 mg | SUBLINGUAL_TABLET | Freq: Once | SUBLINGUAL | Status: AC
  Administered 2014-01-22: 0.4 mg via SUBLINGUAL

## 2014-01-22 NOTE — H&P (Signed)
[facility] History and Physical  Gary Vasquez [id number] DOB: 04-22-1951    PCP:   Doran Heater, MD   Chief Complaint: back and neck pain, along with chest pain.    HPI: Gary Vasquez is an 63 y.o. male with hx of cervical myelopathy and lumbar disc problems previously on chronic pain meds, but hadn't had it for several months as his doctors left town, hx of GERD s/p Nissen fundoplication with re-do, hx of crohn's disease, hx of GI bleed, tobacco abuse, presents to the ER with increase neck and back pain, with paresthesia of both toes, and substernal chest pain, some nausea and vomiting, but no shortness of breath.  He said his neck and back pain has been chronic, but worsen these past couple of days.  His chest pain, nausea and vomiting was new.  It has been intermittent and relieved with NTG.  Evalatuion in the ER included an EKG with showed NSR and nonspecific ST T segments, a CTA of the chest showed no dissection and no PE, initial troponins were negative, and Abd CT showed no AAA.  He did have L4 L5 disc herniation with narrow canal.  He has no leg weakness, bowel or bladder incontinence.  Serology was otherwise unremarkable with no leukocytosis, anemia, or renal insufficiency.  EDP spoke with cardiology at [facility], and recommended that he be admitted here for r/out.  Hospitalist was asked to admit him for atypical chest pain.   Rewiew of Systems:  Constitutional: Negative for malaise, fever and chills. No significant weight loss or weight gain Eyes: Negative for eye pain, redness and discharge, diplopia, visual changes, or flashes of light. ENMT: Negative for ear pain, hoarseness, nasal congestion, sinus pressure and sore throat. No headaches; tinnitus, drooling, or problem swallowing. Cardiovascular: Negative for  palpitations, diaphoresis, dyspnea and peripheral edema. ; No orthopnea, PND Respiratory: Negative for cough, hemoptysis, wheezing and stridor. No pleuritic  chestpain. Gastrointestinal: Negative for nausea, vomiting, diarrhea, constipation, abdominal pain, melena, blood in stool, hematemesis, jaundice and rectal bleeding.    Genitourinary: Negative for frequency, dysuria, incontinence,flank pain and hematuria; Musculoskeletal:  Negative for swelling and trauma.;  Skin: . Negative for pruritus, rash, abrasions, bruising and skin lesion.; ulcerations Neuro: Negative for headache, lightheadedness and neck stiffness. Negative for weakness, altered level of consciousness , altered mental status, extremity weakness, burning feet, involuntary movement, seizure and syncope.  Psych: negative for anxiety, depression, insomnia, tearfulness, panic attacks, hallucinations, paranoia, suicidal or homicidal ideation    Past Medical History  Diagnosis Date  . Crohn disease   . Acid reflux   . Hypertension   . Alcoholic hepatitis   . Arthritis   . Dumping syndrome   . Paraesophageal hernia   . Chronic diarrhea   . Subdural hematoma   . Gastroparesis   . Migraine headache   . Chronic back pain   . TIA (transient ischemic attack)     Past Surgical History  Procedure Laterality Date  . Nissen fundoplication    . Colon resection Right   . Pyloroplasty  2009  . Colonoscopy N/A 07/02/2013    Procedure: COLONOSCOPY;  Surgeon: Rogene Houston, MD;  [facility];  Service: Endoscopy;  Laterality: N/A;  200  . Esophagogastroduodenoscopy N/A 08/14/2013    Procedure: ESOPHAGOGASTRODUODENOSCOPY (EGD);  Surgeon: Rogene Houston, MD;  Location: [facility];  Service: Endoscopy;  Laterality: N/A;  300    Medications:  HOME MEDS: Prior to Admission medications   Medication  Sig Start Date End Date Taking? Authorizing Provider  gabapentin (NEURONTIN) 100 MG capsule Take 100 mg by mouth 3 (three) times daily.   Yes Historical Provider, MD  ibuprofen (ADVIL,MOTRIN) 200 MG tablet Take by mouth every 6 (six) hours as needed for mild pain.   Yes Historical  Provider, MD  loratadine (CLARITIN) 10 MG tablet Take 10 mg by mouth daily.   Yes Historical Provider, MD  LORazepam (ATIVAN) 1 MG tablet Take 1 tablet (1 mg total) by mouth at bedtime. 12/15/13  Yes Rogene Houston, MD  ranitidine (ZANTAC) 150 MG tablet Take 1 tablet (150 mg total) by mouth 2 (two) times daily. 08/11/13  Yes Rogene Houston, MD     Allergies:  Allergies  Allergen Reactions  . Acetaminophen Other (See Comments)    Liver issues    Social History:   reports that he has been smoking Cigarettes.  He has been smoking about 0.25 packs per day. He has never used smokeless tobacco. He reports that he drinks alcohol. He reports that he does not use illicit drugs.  Family History: Family History  Problem Relation Age of Onset  . Healthy Daughter   . Healthy Son      Physical Exam: Filed Vitals:   01/22/14 2000 01/22/14 2030 01/22/14 2100 01/22/14 2130  BP: 124/93 139/98 145/91 152/93  Pulse: 81 74 75 72  Temp:      TempSrc:      Resp: 15 19 16 15   SpO2: 92% 95% 94% 94%   Blood pressure 152/93, pulse 72, temperature 98 F (36.7 C), temperature source Oral, resp. rate 15, SpO2 94.00%.  GEN:  Pleasant  patient lying in the stretcher in no acute distress; cooperative with exam. PSYCH:  alert and oriented x4; does not appear anxious or depressed; affect is appropriate. HEENT: Mucous membranes pink and anicteric; PERRLA; EOM intact; no cervical lymphadenopathy nor thyromegaly or carotid bruit; no JVD; There were no stridor. Neck is very supple. Breasts:: Not examined CHEST WALL: No tenderness CHEST: Normal respiration, clear to auscultation bilaterally.  HEART: Regular rate and rhythm.  There are no murmur, rub, or gallops.   BACK: No kyphosis or scoliosis; no CVA tenderness ABDOMEN: soft and non-tender; no masses, no organomegaly, normal abdominal bowel sounds; no pannus; no intertriginous candida. There is no rebound and no distention. Rectal Exam: Not  done EXTREMITIES: No bone or joint deformity; age-appropriate arthropathy of the hands and knees; no edema; no ulcerations.  There is no calf tenderness. Genitalia: not examined PULSES: 2+ and symmetric SKIN: Normal hydration no rash or ulceration CNS: Cranial nerves 2-12 grossly intact no focal lateralizing neurologic deficit.  Speech is fluent; uvula elevated with phonation, facial symmetry and tongue midline. DTR are normal bilaterally, cerebella exam is intact, barbinski is negative and strengths are equaled bilaterally.  No sensory loss. Lower extremities were strong bilaterally.   Labs on Admission:  Basic Metabolic Panel:  Recent Labs Lab 01/22/14 1527  NA 137  K 3.9  CL 99  CO2 21  GLUCOSE 132*  BUN 13  CREATININE 1.04  CALCIUM 8.9   Liver Function Tests: No results found for this basename: AST, ALT, ALKPHOS, BILITOT, PROT, ALBUMIN,  in the last 168 hours No results found for this basename: LIPASE, AMYLASE,  in the last 168 hours No results found for this basename: AMMONIA,  in the last 168 hours CBC:  Recent Labs Lab 01/22/14 1523  WBC 7.3  NEUTROABS 4.1  HGB 14.7  HCT  45.2  MCV 91.7  PLT 190   Cardiac Enzymes:  Recent Labs Lab 01/22/14 1523  TROPONINI <0.30    CBG: No results found for this basename: GLUCAP,  in the last 168 hours   Radiological Exams on Admission: Dg Chest 2 View  01/22/2014   CLINICAL DATA:  63 year old male with chest pain and shortness of breath.  EXAM: CHEST  2 VIEW  COMPARISON:  09/24/2013 and prior chest radiographs dating back to 09/06/2004  FINDINGS: The cardiomediastinal silhouette is unremarkable.  Mild elevation of the right hemidiaphragm again noted.  There is no evidence of focal airspace disease, pulmonary edema, suspicious pulmonary nodule/mass, pleural effusion, or pneumothorax.  No acute bony abnormalities are identified.  IMPRESSION: No active cardiopulmonary disease.   Electronically Signed   By: Hassan Rowan M.D.   On:  01/22/2014 15:38   Ct Angio Chest Aorta W/cm &/or Wo/cm  01/22/2014   CLINICAL DATA:  Chest pain, back pain, pain between shoulder blades, question dissection  EXAM: CT ANGIOGRAPHY CHEST, ABDOMEN AND PELVIS  TECHNIQUE: Multidetector CT imaging of the chest was performed using the standard protocol during bolus administration of intravenous contrast. Multiplanar CT image reconstructions and MIPs were obtained to evaluate the vascular anatomy. Pre contrast images of the chest were obtained.  CONTRAST:  120mL OMNIPAQUE IOHEXOL 350 MG/ML SOLN IV ; oral contrast was not administered.  COMPARISON:  CT chest 09/17/2005, CT abdomen and pelvis 09/24/2013  FINDINGS: CTA CHEST FINDINGS  Scattered atherosclerotic calcifications aorta and coronary arteries on precontrast imaging.  No evidence of intramural hematoma on pre contrast images.  Normal aortic enhancement post contrast without aneurysm or dissection.  Pulmonary arteries well opacified and patent.  No evidence of pulmonary embolism.  No thoracic adenopathy.  Emphysematous changes greatest at apices.  No acute infiltrate, pleural effusion, pneumothorax, or mass/nodule.  Osseous structures unremarkable.  Review of the MIP images confirms the above findings.  CTA ABDOMEN AND PELVIS FINDINGS  Scattered atherosclerotic calcifications aorta without aneurysm or dissection.  Main arterial origins in the abdomen are patent.  Density at the gastroesophageal junction consistent with history of fundoplication.  Liver, spleen, pancreas, kidneys, and adrenal glands normal.  Post gastrojejunostomy.  Prior RIGHT hemicolectomy.  Stomach and bowel loops otherwise unremarkable.  Normal appearing bladder and ureters.  RIGHT inguinal hernia containing fat.  No mass, adenopathy, free fluid, or free air.  No acute osseous findings.  Broad-based bulging disc versus disk herniation with narrowing of the spinal canal at L4-L5.  Review of the MIP images confirms the above findings.   IMPRESSION: No evidence of aortic aneurysm or dissection.  COPD changes without acute intra thoracic abnormality.  RIGHT inguinal hernia containing fat.  Bulging disc versus disk herniation at L4-L5 with AP narrowing of the spinal canal.  Postsurgical changes in abdomen without evidence acute intra-abdominal or intrapelvic findings.   Electronically Signed   By: Lavonia Dana M.D.   On: 01/22/2014 21:09   Ct Angio Abd/pel W/ And/or W/o  01/22/2014   CLINICAL DATA:  Chest pain, back pain, pain between shoulder blades, question dissection  EXAM: CT ANGIOGRAPHY CHEST, ABDOMEN AND PELVIS  TECHNIQUE: Multidetector CT imaging of the chest was performed using the standard protocol during bolus administration of intravenous contrast. Multiplanar CT image reconstructions and MIPs were obtained to evaluate the vascular anatomy. Pre contrast images of the chest were obtained.  CONTRAST:  128mL OMNIPAQUE IOHEXOL 350 MG/ML SOLN IV ; oral contrast was not administered.  COMPARISON:  CT chest  09/17/2005, CT abdomen and pelvis 09/24/2013  FINDINGS: CTA CHEST FINDINGS  Scattered atherosclerotic calcifications aorta and coronary arteries on precontrast imaging.  No evidence of intramural hematoma on pre contrast images.  Normal aortic enhancement post contrast without aneurysm or dissection.  Pulmonary arteries well opacified and patent.  No evidence of pulmonary embolism.  No thoracic adenopathy.  Emphysematous changes greatest at apices.  No acute infiltrate, pleural effusion, pneumothorax, or mass/nodule.  Osseous structures unremarkable.  Review of the MIP images confirms the above findings.  CTA ABDOMEN AND PELVIS FINDINGS  Scattered atherosclerotic calcifications aorta without aneurysm or dissection.  Main arterial origins in the abdomen are patent.  Density at the gastroesophageal junction consistent with history of fundoplication.  Liver, spleen, pancreas, kidneys, and adrenal glands normal.  Post gastrojejunostomy.  Prior  RIGHT hemicolectomy.  Stomach and bowel loops otherwise unremarkable.  Normal appearing bladder and ureters.  RIGHT inguinal hernia containing fat.  No mass, adenopathy, free fluid, or free air.  No acute osseous findings.  Broad-based bulging disc versus disk herniation with narrowing of the spinal canal at L4-L5.  Review of the MIP images confirms the above findings.  IMPRESSION: No evidence of aortic aneurysm or dissection.  COPD changes without acute intra thoracic abnormality.  RIGHT inguinal hernia containing fat.  Bulging disc versus disk herniation at L4-L5 with AP narrowing of the spinal canal.  Postsurgical changes in abdomen without evidence acute intra-abdominal or intrapelvic findings.   Electronically Signed   By: Lavonia Dana M.D.   On: 01/22/2014 21:09    EKG: Independently reviewed. NSR with no acute ST T changes.   Assessment/Plan Present on Admission:  . Chest pain, atypical . GERD . Atypical chest pain Tobacco abuse Back pain Neck pain  PLAN:  Will admit him for chest pain.  Though he has several cardiac risk factors, his pain is atypical and could be GI as he has had GERD with Nissen's procedure before.  This could explain the relief he had with NTG.  He also has cervical problem and lumbar radiculopathy, and is requiring pain medication. He has no sign or symptoms of cord compression.  Therefore, for his chest pain, will cycle his troponin, and obtain ECHO.  He will need a nuclear stress test once able to perform it.  Will give IV PPI also.  For now, I will continue with NTG drip, which will require SDU admit.  Given his atypical presentation along with his hx of GI bleed, I will not start him on IV Heparin.   For his tobacco use, have encouraged him to stop smoking.  For his pain, will give morphine PRN.  He sis stable, full code, and will be admitted to [facility] service.  Thank you for asking me to participate in his care.     Other plans as per orders.  Code Status: FULL CODE.     Orvan Falconer, MD. [facility] Pager [phone] 7pm to 7am.  01/22/2014, 11:06 PM

## 2014-01-22 NOTE — ED Notes (Signed)
Pt c/o intermittent central-left chest pain that began last night. Pt also reports SOB, lightheadedness, back pain, diaphoresis and nausea since symptoms began. Pt denies cough.

## 2014-01-22 NOTE — ED Notes (Signed)
1620- pt c/o pf chest and lower back pain 8/10 with numbness in BLE, two nitro given which decreased pts pain to 4/10.  12 lead completed, nurse had concerns with questionable ST elevation in lead 2, 3 V6 and V5. Current EKG along with EKG done on admission showed with Dr. Nickie Retort. I discussed these concerns with him.   Orders given for Morphine and Zofran which was then given by myself.  At this time pt states pain better now 3/10

## 2014-01-22 NOTE — ED Notes (Signed)
Pt became lightheaded during triage and was unable to stand without assistance.

## 2014-01-22 NOTE — ED Notes (Signed)
Pt states his chest pain is better, complaining of his back hurting & some pain to the back of the neck.

## 2014-01-22 NOTE — ED Notes (Signed)
Pt requesting pain medication for his back pain again. EDP notified.

## 2014-01-22 NOTE — ED Provider Notes (Signed)
CSN: [id number]     Arrival date & time 01/22/14  1404 History  This chart was scribed for Gary Greek, MD by Zettie Pho, ED Scribe. This patient was seen in room [facility]15[facility] and the patient's care was started at 3:33 PM.    Chief Complaint  Patient presents with  . Chest Pain   The history is provided by the patient. No language interpreter was used.   HPI Comments: Gary Vasquez is a 63 y.o. Male with a history of HTN and TIA who presents to the Emergency Department complaining of a waxing and waning pain to the left side of the chest that radiates to the upper back onset last night. He states that the pain is exacerbated with deep breathing and certain movements. Patient reports some associated lightheadedness. He reports some nausea with multiple episodes of emesis yesterday that he states is normal for him and denies any recent changes. Patient has an allergy to acetaminophen. Patient also has a history of Crohn's disease, alcoholic hepatitis, Dumping syndrome, paraesophageal hernia, subdural hematoma, and chronic back pain.   Past Medical History  Diagnosis Date  . Crohn disease   . Acid reflux   . Hypertension   . Alcoholic hepatitis   . Arthritis   . Dumping syndrome   . Paraesophageal hernia   . Chronic diarrhea   . Subdural hematoma   . Gastroparesis   . Migraine headache   . Chronic back pain   . TIA (transient ischemic attack)    Past Surgical History  Procedure Laterality Date  . Nissen fundoplication    . Colon resection Right   . Pyloroplasty  2009  . Colonoscopy N/A 07/02/2013    Procedure: COLONOSCOPY;  Surgeon: Rogene Houston, MD;  [facility];  Service: Endoscopy;  Laterality: N/A;  200  . Esophagogastroduodenoscopy N/A 08/14/2013    Procedure: ESOPHAGOGASTRODUODENOSCOPY (EGD);  Surgeon: Rogene Houston, MD;  [facility]: [facility];  Service: Endoscopy;  Laterality: N/A;  300   Family History  Problem Relation Age of Onset  .  Healthy Daughter   . Healthy Son    History  Substance Use Topics  . Smoking status: Current Every Day Smoker -- 0.25 packs/day    Types: Cigarettes  . Smokeless tobacco: Never Used     Comment: 2-3 cigarrettes a day  . Alcohol Use: 0.0 oz/week    3-4 drink(s) per week    Review of Systems  Cardiovascular: Positive for chest pain.  Gastrointestinal: Positive for nausea (baseline) and vomiting (baseline).  Neurological: Positive for light-headedness.  All other systems reviewed and are negative.     Allergies  Acetaminophen  Home Medications   Prior to Admission medications   Medication Sig Start Date End Date Taking? Authorizing Provider  gabapentin (NEURONTIN) 100 MG capsule Take 100 mg by mouth 3 (three) times daily.    Historical Provider, MD  LORazepam (ATIVAN) 1 MG tablet Take 1 tablet (1 mg total) by mouth at bedtime. 12/15/13   Rogene Houston, MD  Multiple Vitamin (MULTIVITAMIN WITH MINERALS) TABS Take 1 tablet by mouth daily.    Historical Provider, MD  ranitidine (ZANTAC) 150 MG tablet Take 1 tablet (150 mg total) by mouth 2 (two) times daily. 08/11/13   Rogene Houston, MD  sertraline (ZOLOFT) 50 MG tablet Take 100 mg by mouth daily.     Historical Provider, MD   Triage Vitals: BP 131/84  Pulse 110  Temp(Src) 98 F (36.7 C) (Oral)  Resp 20  SpO2 98%  Physical Exam  Constitutional: He is oriented to person, place, and time. He appears well-developed and well-nourished. No distress.  HENT:  Head: Normocephalic and atraumatic.  Right Ear: Hearing normal.  Left Ear: Hearing normal.  Nose: Nose normal.  Mouth/Throat: Oropharynx is clear and moist and mucous membranes are normal.  Eyes: Conjunctivae and EOM are normal. Pupils are equal, round, and reactive to light.  Neck: Normal range of motion. Neck supple.  Cardiovascular: Regular rhythm, S1 normal and S2 normal.  Exam reveals no gallop and no friction rub.   No murmur heard. Pulmonary/Chest: Effort normal  and breath sounds normal. No respiratory distress. He exhibits no tenderness.  Abdominal: Soft. Normal appearance and bowel sounds are normal. There is no hepatosplenomegaly. There is no tenderness. There is no rebound, no guarding, no tenderness at McBurney's point and negative Murphy's sign. No hernia.  Musculoskeletal: Normal range of motion.  Neurological: He is alert and oriented to person, place, and time. He has normal strength. No cranial nerve deficit or sensory deficit. Coordination normal. GCS eye subscore is 4. GCS verbal subscore is 5. GCS motor subscore is 6.  Skin: Skin is warm, dry and intact. No rash noted. No cyanosis.  Psychiatric: He has a normal mood and affect. His speech is normal and behavior is normal. Thought content normal.    ED Course  Procedures (including critical care time)  DIAGNOSTIC STUDIES: Oxygen Saturation is 98% on room air, normal by my interpretation.    COORDINATION OF CARE: 2:30 PM- Ordered EKG, BMP, CBC, troponin, and a chest x-ray. Ordered aspirin to manage symptoms.   3:36 PM- Discussed treatment plan with patient at bedside and patient verbalized agreement.     Labs Review Labs Reviewed  TROPONIN I  CBC WITH DIFFERENTIAL  BASIC METABOLIC PANEL    Imaging Review No results found.   EKG Interpretation   Date/Time:  Thursday Jan 22 2014 14:29:06 EDT Ventricular Rate:  101 PR Interval:  114 QRS Duration: 82 QT Interval:  324 QTC Calculation: 420 R Axis:   61 Text Interpretation:  Sinus tachycardia Possible Left atrial enlargement  Borderline ECG Confirmed by COOK  MD, BRIAN ([id number]) on 01/22/2014 3:19:04  PM      MDM   Final diagnoses:  None   Patient presented to the ER for evaluation of chest pain. Patient has been experiencing constant pain since last night, but reports that it waxes and wanes. He has not identified any specific alleviating or S. ring fractures, although he did agree that sometimes changes in positions  make the pain worse and also taking a deep breath. Presentation EKG did not show any ischemia. He has borderline tachycardia. Patient indicates that he had nausea and vomiting through the course of the day yesterday secondary to his Crohn's disease. That was not unusual for him. He likely has developed dehydration due to this. He also is complaining of significant pain, likely resulting in the tachycardia.   Patient apparently became extremely agitated while the workup was in progress. He claimed that we were "not doing anything for him" and wanted to leave. He was reevaluated and administered morphine for his pain. This helped his pain somewhat and he agreed to stay for any further workup.  Patient was administered nitroglycerin and did have improvement in his pain. Pain started to come back once again and he was administered sublingual nitroglycerin with improvement, at which time it was decided to start him on nitroglycerin drip  for his pain.  Case was discussed with Doctor Kennith Center, on call for cardiology at [facility]. We discussed the case, he reviewed the labs and EKG. It was felt that the symptoms are likely unstable angina although, pericarditis is also considered because he is very slight elevations diffusely on his EKG. I suspect that these are normal for him. Doctor Sivak did not think the patient required transfer to [facility] at this time. He felt that the patient could be treated with IV nitroglycerin here at anytime for the night and cycle enzymes. Followup with cardiology in the morning. If any of his enzymes become elevated or he becomes unstable, we will set up for transfer, but start off addition to any hospital.  Because the patient has been experiencing chest pain, back pain and abdominal pain, aortic aneurysm/dissection was considered. CT angiography of chest, abdomen, pelvis performed. These were negative for any acute abnormality.  CRITICAL CARE Performed by: Gary Vasquez   Total critical care time: 25min  Critical care time was exclusive of separately billable procedures and treating other patients.  Critical care was necessary to treat or prevent imminent or life-threatening deterioration.  Critical care was time spent personally by me on the following activities: development of treatment plan with patient and/or surrogate as well as nursing, discussions with consultants, evaluation of patient's response to treatment, examination of patient, obtaining history from patient or surrogate, ordering and performing treatments and interventions, ordering and review of laboratory studies, ordering and review of radiographic studies, pulse oximetry and re-evaluation of patient's condition.   I personally performed the services described in this documentation, which was scribed in my presence. The recorded information has been reviewed and is accurate.   Gary Greek, MD 01/23/14 0030

## 2014-01-23 ENCOUNTER — Encounter (HOSPITAL_COMMUNITY): Payer: Self-pay | Admitting: Cardiology

## 2014-01-23 DIAGNOSIS — R0789 Other chest pain: Secondary | ICD-10-CM

## 2014-01-23 DIAGNOSIS — K219 Gastro-esophageal reflux disease without esophagitis: Principal | ICD-10-CM

## 2014-01-23 DIAGNOSIS — I517 Cardiomegaly: Secondary | ICD-10-CM

## 2014-01-23 DIAGNOSIS — F1010 Alcohol abuse, uncomplicated: Secondary | ICD-10-CM

## 2014-01-23 DIAGNOSIS — M545 Low back pain, unspecified: Secondary | ICD-10-CM

## 2014-01-23 DIAGNOSIS — G8929 Other chronic pain: Secondary | ICD-10-CM

## 2014-01-23 DIAGNOSIS — F411 Generalized anxiety disorder: Secondary | ICD-10-CM

## 2014-01-23 LAB — TROPONIN I
Troponin I: <0.3 ng/mL (ref <0.30)
Troponin I: <0.3 ng/mL (ref <0.30)

## 2014-01-23 LAB — MRSA PCR SCREENING: MRSA BY PCR: NEGATIVE

## 2014-01-23 LAB — TSH: TSH: 2.99 u[IU]/mL (ref 0.350–4.500)

## 2014-01-23 MED ORDER — ASPIRIN EC 325 MG PO TBEC
325.0000 mg | DELAYED_RELEASE_TABLET | Freq: Every day | ORAL | Status: DC
  Administered 2014-01-23 – 2014-01-24 (×2): 325 mg via ORAL
  Filled 2014-01-23 (×2): qty 1

## 2014-01-23 MED ORDER — LORAZEPAM 2 MG/ML IJ SOLN
0.5000 mg | Freq: Once | INTRAMUSCULAR | Status: AC
  Administered 2014-01-23: 0.5 mg via INTRAVENOUS
  Filled 2014-01-23: qty 1

## 2014-01-23 MED ORDER — PANTOPRAZOLE SODIUM 40 MG PO TBEC
40.0000 mg | DELAYED_RELEASE_TABLET | Freq: Two times a day (BID) | ORAL | Status: DC
  Administered 2014-01-23 – 2014-01-24 (×3): 40 mg via ORAL
  Filled 2014-01-23 (×3): qty 1

## 2014-01-23 MED ORDER — MORPHINE SULFATE 2 MG/ML IJ SOLN
2.0000 mg | INTRAMUSCULAR | Status: DC | PRN
  Administered 2014-01-23 – 2014-01-24 (×7): 2 mg via INTRAVENOUS
  Filled 2014-01-23 (×7): qty 1

## 2014-01-23 MED ORDER — GI COCKTAIL ~~LOC~~
30.0000 mL | Freq: Three times a day (TID) | ORAL | Status: DC | PRN
  Filled 2014-01-23: qty 30

## 2014-01-23 MED ORDER — ONDANSETRON HCL 4 MG PO TABS: 4.0000 mg | ORAL_TABLET | Freq: Four times a day (QID) | ORAL | Status: DC | PRN

## 2014-01-23 MED ORDER — GABAPENTIN 100 MG PO CAPS
100.0000 mg | ORAL_CAPSULE | Freq: Three times a day (TID) | ORAL | Status: DC
  Administered 2014-01-23 – 2014-01-24 (×4): 100 mg via ORAL
  Filled 2014-01-23 (×4): qty 1

## 2014-01-23 MED ORDER — NITROGLYCERIN IN D5W 200-5 MCG/ML-% IV SOLN
5.0000 ug/min | Freq: Once | INTRAVENOUS | Status: AC
  Administered 2014-01-23: 5 ug/min via INTRAVENOUS

## 2014-01-23 MED ORDER — HEPARIN SODIUM (PORCINE) 5000 UNIT/ML IJ SOLN
5000.0000 [IU] | Freq: Three times a day (TID) | INTRAMUSCULAR | Status: DC
  Administered 2014-01-23 – 2014-01-24 (×5): 5000 [IU] via SUBCUTANEOUS
  Filled 2014-01-23 (×5): qty 1

## 2014-01-23 MED ORDER — LORAZEPAM 0.5 MG PO TABS
1.0000 mg | ORAL_TABLET | Freq: Every day | ORAL | Status: DC
  Administered 2014-01-23: 1 mg via ORAL
  Filled 2014-01-23: qty 2

## 2014-01-23 MED ORDER — METHOCARBAMOL 500 MG PO TABS
500.0000 mg | ORAL_TABLET | Freq: Three times a day (TID) | ORAL | Status: AC
  Administered 2014-01-23 (×3): 500 mg via ORAL
  Filled 2014-01-23 (×3): qty 1

## 2014-01-23 MED ORDER — PANTOPRAZOLE SODIUM 40 MG IV SOLR
40.0000 mg | Freq: Every day | INTRAVENOUS | Status: DC
  Administered 2014-01-23: 40 mg via INTRAVENOUS
  Filled 2014-01-23: qty 40

## 2014-01-23 MED ORDER — ONDANSETRON HCL 4 MG/2ML IJ SOLN: 4.0000 mg | Freq: Four times a day (QID) | INTRAMUSCULAR | Status: DC | PRN

## 2014-01-23 MED ORDER — MORPHINE SULFATE 4 MG/ML IJ SOLN
4.0000 mg | INTRAMUSCULAR | Status: DC | PRN
  Administered 2014-01-23 (×2): 4 mg via INTRAVENOUS
  Filled 2014-01-23 (×2): qty 1

## 2014-01-23 MED ORDER — DEXTROSE-NACL 5-0.9 % IV SOLN
INTRAVENOUS | Status: DC
  Administered 2014-01-23: via INTRAVENOUS

## 2014-01-23 MED ORDER — ALPRAZOLAM 0.5 MG PO TABS
0.5000 mg | ORAL_TABLET | Freq: Two times a day (BID) | ORAL | Status: DC | PRN
  Administered 2014-01-23 (×2): 0.5 mg via ORAL
  Filled 2014-01-23 (×2): qty 1

## 2014-01-23 MED ORDER — METHOCARBAMOL 500 MG PO TABS: 500.0000 mg | ORAL_TABLET | Freq: Four times a day (QID) | ORAL | Status: DC | PRN

## 2014-01-23 NOTE — Progress Notes (Signed)
  Echocardiogram 2D Echocardiogram has been performed.  Valinda Hoar 01/23/2014, 12:32 PM

## 2014-01-23 NOTE — Progress Notes (Addendum)
[facility] PROGRESS NOTE  Gary Vasquez KYH:[id number] DOB: February 07, 1951 DOA: 01/22/2014 PCP: Doran Heater, MD  Assessment/Plan: 1. Chest pain -Cardiac enzymes negative x3, CT angio of chest negative for PE -Await echo -Remains on nitro drip pending cardiology evaluation-still some chest pain but decreased this a.m. -Continue aspirin -Continue PPI 2. GERD/status post Nissen fundoplication the past -Continue PPI increase to BID 3. Chronic back and neck pain -Continue pain management -CT abdomen and pelvis with broad-based bulging disc versus disc herniation with narrowing of the spinal at the L4-5 -Will add Robaxin  4. Tobacco abuse -Smoking cessation consult 5. H/o Hypertension -Was on no outpatient meds, BP currently controlled on nitroglycerin -Continue to monitor and further treat accordingly   Code Status: Full Family Communication: None at bedside Disposition Plan: ICU for now, follow and transfer to telemetry when off NTG drip   Consultants:  Cardiology   Procedures:  none  Antibiotics:  none  HPI/Subjective: States had some chest pain this a.m. but much less than yesterday. Still with a back and neck pain. Denies nausea vomiting  Objective: Filed Vitals:   01/23/14 0800  BP: 116/73  Pulse:   Temp: 97.7 F (36.5 C)  Resp: 10    Intake/Output Summary (Last 24 hours) at 01/23/14 1517 Last data filed at 01/23/14 0600  Gross per 24 hour  Intake 280.83 ml  Output    300 ml  Net -19.17 ml   Filed Weights   01/23/14 0000  Weight: 78.2 kg (172 lb 6.4 oz)    Exam:  General: alert & oriented x 3 In NAD Cardiovascular: RRR, nl S1 s2 Respiratory: CTAB Abdomen: soft +BS epigastric tenderness present/ND, no masses palpable Extremities: No cyanosis and no edema    Data Reviewed: Basic Metabolic Panel:  Recent Labs Lab 01/22/14 1527  NA 137  K 3.9  CL 99  CO2 21  GLUCOSE 132*  BUN 13  CREATININE 1.04  CALCIUM 8.9   Liver  Function Tests: No results found for this basename: AST, ALT, ALKPHOS, BILITOT, PROT, ALBUMIN,  in the last 168 hours No results found for this basename: LIPASE, AMYLASE,  in the last 168 hours No results found for this basename: AMMONIA,  in the last 168 hours CBC:  Recent Labs Lab 01/22/14 1523  WBC 7.3  NEUTROABS 4.1  HGB 14.7  HCT 45.2  MCV 91.7  PLT 190   Cardiac Enzymes:  Recent Labs Lab 01/22/14 1523 01/23/14 0112 01/23/14 0538  TROPONINI <0.30 <0.30 <0.30   BNP (last 3 results) No results found for this basename: PROBNP,  in the last 8760 hours CBG: No results found for this basename: GLUCAP,  in the last 168 hours  Recent Results (from the past 240 hour(s))  MRSA PCR SCREENING     Status: None   Collection Time    01/22/14 11:55 PM      Result Value Ref Range Status   MRSA by PCR NEGATIVE  NEGATIVE Final   Comment:            The GeneXpert MRSA Assay (FDA     approved for NASAL specimens     only), is one component of a     comprehensive MRSA colonization     surveillance program. It is not     intended to diagnose MRSA     infection nor to guide or     monitor treatment for     MRSA infections.     Studies: Dg Chest  2 View  01/22/2014   CLINICAL DATA:  63 year old male with chest pain and shortness of breath.  EXAM: CHEST  2 VIEW  COMPARISON:  09/24/2013 and prior chest radiographs dating back to 09/06/2004  FINDINGS: The cardiomediastinal silhouette is unremarkable.  Mild elevation of the right hemidiaphragm again noted.  There is no evidence of focal airspace disease, pulmonary edema, suspicious pulmonary nodule/mass, pleural effusion, or pneumothorax.  No acute bony abnormalities are identified.  IMPRESSION: No active cardiopulmonary disease.   Electronically Signed   By: Hassan Rowan M.D.   On: 01/22/2014 15:38   Ct Angio Chest Aorta W/cm &/or Wo/cm  01/22/2014   CLINICAL DATA:  Chest pain, back pain, pain between shoulder blades, question dissection   EXAM: CT ANGIOGRAPHY CHEST, ABDOMEN AND PELVIS  TECHNIQUE: Multidetector CT imaging of the chest was performed using the standard protocol during bolus administration of intravenous contrast. Multiplanar CT image reconstructions and MIPs were obtained to evaluate the vascular anatomy. Pre contrast images of the chest were obtained.  CONTRAST:  144mL OMNIPAQUE IOHEXOL 350 MG/ML SOLN IV ; oral contrast was not administered.  COMPARISON:  CT chest 09/17/2005, CT abdomen and pelvis 09/24/2013  FINDINGS: CTA CHEST FINDINGS  Scattered atherosclerotic calcifications aorta and coronary arteries on precontrast imaging.  No evidence of intramural hematoma on pre contrast images.  Normal aortic enhancement post contrast without aneurysm or dissection.  Pulmonary arteries well opacified and patent.  No evidence of pulmonary embolism.  No thoracic adenopathy.  Emphysematous changes greatest at apices.  No acute infiltrate, pleural effusion, pneumothorax, or mass/nodule.  Osseous structures unremarkable.  Review of the MIP images confirms the above findings.  CTA ABDOMEN AND PELVIS FINDINGS  Scattered atherosclerotic calcifications aorta without aneurysm or dissection.  Main arterial origins in the abdomen are patent.  Density at the gastroesophageal junction consistent with history of fundoplication.  Liver, spleen, pancreas, kidneys, and adrenal glands normal.  Post gastrojejunostomy.  Prior RIGHT hemicolectomy.  Stomach and bowel loops otherwise unremarkable.  Normal appearing bladder and ureters.  RIGHT inguinal hernia containing fat.  No mass, adenopathy, free fluid, or free air.  No acute osseous findings.  Broad-based bulging disc versus disk herniation with narrowing of the spinal canal at L4-L5.  Review of the MIP images confirms the above findings.  IMPRESSION: No evidence of aortic aneurysm or dissection.  COPD changes without acute intra thoracic abnormality.  RIGHT inguinal hernia containing fat.  Bulging disc versus  disk herniation at L4-L5 with AP narrowing of the spinal canal.  Postsurgical changes in abdomen without evidence acute intra-abdominal or intrapelvic findings.   Electronically Signed   By: Lavonia Dana M.D.   On: 01/22/2014 21:09   Ct Angio Abd/pel W/ And/or W/o  01/22/2014   CLINICAL DATA:  Chest pain, back pain, pain between shoulder blades, question dissection  EXAM: CT ANGIOGRAPHY CHEST, ABDOMEN AND PELVIS  TECHNIQUE: Multidetector CT imaging of the chest was performed using the standard protocol during bolus administration of intravenous contrast. Multiplanar CT image reconstructions and MIPs were obtained to evaluate the vascular anatomy. Pre contrast images of the chest were obtained.  CONTRAST:  114mL OMNIPAQUE IOHEXOL 350 MG/ML SOLN IV ; oral contrast was not administered.  COMPARISON:  CT chest 09/17/2005, CT abdomen and pelvis 09/24/2013  FINDINGS: CTA CHEST FINDINGS  Scattered atherosclerotic calcifications aorta and coronary arteries on precontrast imaging.  No evidence of intramural hematoma on pre contrast images.  Normal aortic enhancement post contrast without aneurysm or dissection.  Pulmonary arteries  well opacified and patent.  No evidence of pulmonary embolism.  No thoracic adenopathy.  Emphysematous changes greatest at apices.  No acute infiltrate, pleural effusion, pneumothorax, or mass/nodule.  Osseous structures unremarkable.  Review of the MIP images confirms the above findings.  CTA ABDOMEN AND PELVIS FINDINGS  Scattered atherosclerotic calcifications aorta without aneurysm or dissection.  Main arterial origins in the abdomen are patent.  Density at the gastroesophageal junction consistent with history of fundoplication.  Liver, spleen, pancreas, kidneys, and adrenal glands normal.  Post gastrojejunostomy.  Prior RIGHT hemicolectomy.  Stomach and bowel loops otherwise unremarkable.  Normal appearing bladder and ureters.  RIGHT inguinal hernia containing fat.  No mass, adenopathy, free  fluid, or free air.  No acute osseous findings.  Broad-based bulging disc versus disk herniation with narrowing of the spinal canal at L4-L5.  Review of the MIP images confirms the above findings.  IMPRESSION: No evidence of aortic aneurysm or dissection.  COPD changes without acute intra thoracic abnormality.  RIGHT inguinal hernia containing fat.  Bulging disc versus disk herniation at L4-L5 with AP narrowing of the spinal canal.  Postsurgical changes in abdomen without evidence acute intra-abdominal or intrapelvic findings.   Electronically Signed   By: Lavonia Dana M.D.   On: 01/22/2014 21:09    Scheduled Meds: . aspirin EC  325 mg Oral Daily  . gabapentin  100 mg Oral TID  . heparin  5,000 Units Subcutaneous 3 times per day  . LORazepam  0.5 mg Intravenous Once  . LORazepam  1 mg Oral QHS  . pantoprazole (PROTONIX) IV  40 mg Intravenous QHS   Continuous Infusions: . dextrose 5 % and 0.9% NaCl 50 mL/hr at 01/23/14 0023    Principal Problem:   Chest pain, atypical Active Problems:   GERD   CROHN'S DISEASE, HX OF   NISSEN FUNDOPLICATION, LAPAROSCOPIC, HX OF   Chronic low back pain   Atypical chest pain    Time spent:35    [facility] Pager [phone]. If 7PM-7AM, please contact night-coverage at [email or web address], password [facility] 01/23/2014, 9:38 AM  LOS: 1 day

## 2014-01-23 NOTE — Consult Note (Signed)
CARDIOLOGY CONSULT NOTE   Patient ID: Gary Vasquez MRN: [id number] DOB/AGE: 1951-01-16 63 y.o.  Admit Date: 01/22/2014 Referring Physician: PTH Primary Physician: Doran Heater, MD Consulting Cardiologist: Rozann Lesches MD Reason for Consultation: Atypical chest pain  Clinical Summary Gary Vasquez is a 63 y.o.male with multiple medical problems to include alcohol abuse, GERD status post Nissen fundoplication redo, GI bleed, hypertension, SDH, alcoholic hepatitis, ongoing tobacco abuse, lumbar and cervical spine disease, who presented to the emergency room with complaints of increased neck and back pain, and substernal chest pain with associated nausea and vomiting but no shortness of breath.  The patient states the pain was unrelenting for 2 days, which he described as epigastric pressure, radiating to his back described as sharp. He had nausea and vomiting for one day prior to this occurring, and the following morning he began to have discomfort substernally He states that the pressure in his chest was different from any type of pressure he has had in the past. He has been seen by a cardiologist several years ago prior to having had surgery prior to partial colectomy, and fundoplication redo. At that time the patient was found to be stable requiring no further cardiac testing, he states this happened about 15 years ago. No details.  On arrival to emergency room the patient's blood pressure is  131/84 HR 110 O2 sat temperature 90.8. Review of labs unremarkable with the exception of mildly elevated blood glucose.  CT of chest is negative for PE, CT of the abdomen and pelvis was negative for aortic aneurysm or dissection. He was found to have COPD changes.  Broad-based bulging disc vs. disc herniation L4-L5 with AP narrowing of the spinal canal.. Chest x-ray was negative for cardiopulmonary disease. Cardiac enzymes are found be negative x3.  EKG showed a sinus tachycardia with nonspecific ST  changes in the inferior leads. Echocardiogram has been ordered. Most recent echocardiogram was completed in 2008 revealing normal systolic function.  He was given ASA 324 mg, sublingual nitroglycerin, placed on nitroglycerin drip, given one dose of IV morphine, and placed in ICU for further observation. He states discomfort in his chest has been somewhat relieved, although he does have ongoing mild pressure. He continues to have ongoing back pain which is requiring multiple doses of morphine.  He continues to drink beer daily, and smoke cigarettes. Denies illicit drug use.   Allergies  Allergen Reactions  . Acetaminophen Other (See Comments)    Liver issues    Medications Scheduled Medications: . aspirin EC  325 mg Oral Daily  . gabapentin  100 mg Oral TID  . heparin  5,000 Units Subcutaneous 3 times per day  . LORazepam  1 mg Oral QHS  . methocarbamol  500 mg Oral TID  . pantoprazole  40 mg Oral BID AC    Infusions: . dextrose 5 % and 0.9% NaCl 50 mL/hr at 01/23/14 0023    PRN Medications: gi cocktail, [START ON 01/24/2014] methocarbamol, morphine injection, ondansetron (ZOFRAN) IV, ondansetron   Past Medical History  Diagnosis Date  . Crohn disease   . Acid reflux   . Essential hypertension, benign   . Alcoholic hepatitis   . Arthritis   . Dumping syndrome   . Paraesophageal hernia   . Chronic diarrhea   . Subdural hematoma   . Gastroparesis   . Migraine headache   . Chronic back pain   . TIA (transient ischemic attack)     Past Surgical History  Procedure Laterality Date  . Nissen fundoplication    . Colon resection Right   . Pyloroplasty  2009  . Colonoscopy N/A 07/02/2013    Procedure: COLONOSCOPY;  Surgeon: Rogene Houston, MD;  Location: [facility];  Service: Endoscopy;  Laterality: N/A;  200  . Esophagogastroduodenoscopy N/A 08/14/2013    Procedure: ESOPHAGOGASTRODUODENOSCOPY (EGD);  Surgeon: Rogene Houston, MD;  Location: [facility];  Service:  Endoscopy;  Laterality: N/A;  300    Family History  Problem Relation Age of Onset  . Healthy Daughter   . Healthy Son   . Heart attack Mother     CABG  . Heart attack Father     CABG  . Heart attack Brother     CABG    Social History Gary Vasquez reports that he has been smoking Cigarettes.  He has been smoking about 0.25 packs per day. He has never used smokeless tobacco. Gary Vasquez reports that he drinks alcohol.  Review of Systems Otherwise reviewed and negative except as outlined.  Physical Examination Blood pressure 147/93, pulse 44, temperature 97.7 F (36.5 C), temperature source Oral, resp. rate 17, height 6' (1.829 m), weight 172 lb 6.4 oz (78.2 kg), SpO2 94.00%.  Intake/Output Summary (Last 24 hours) at 01/23/14 1201 Last data filed at 01/23/14 1100  Gross per 24 hour  Intake 530.83 ml  Output    300 ml  Net 230.83 ml    Telemetry: NSR rate in the 70;s.  GEN: Groggy from recent dose of morphine complaining of back and neck pain. HEENT: Conjunctiva and lids normal, oropharynx clear with moist mucosa. Neck: Supple, no elevated JVP or carotid bruits, no thyromegaly. Lungs: Clear to auscultation, nonlabored breathing at rest.Hurts to take deep breath. Cardiac: Regular rate and rhythm, no S3 or significant systolic murmur, no pericardial rub. Abdomen: Soft, epigastric tenderness, no hepatomegaly, hyperactive bowel sounds present, no guarding or rebound. Extremities: No pitting edema, distal pulses 2+. Skin: Warm and dry. Musculoskeletal: No kyphosis. Neuropsychiatric: Alert and oriented x3, affect grossly appropriate.   Prior Cardiac Testing/Procedures 01/07/11 1.Echocardiogram 1. Technically adequate echocardiographic study.  2. Normal left atrium, right atrium and right ventricle.  3. Normal aortic, mitral, tricuspid and pulmonic valves. Normal proximal  pulmonary artery. Slight mitral annular calcification.  4. Normal left ventricular size; no LVH except for  slight thickening of the  proximal septum. Normal regional and global function.  5. Normal Doppler examination.  6. Normal IVC.  Lab Results  Basic Metabolic Panel:  Recent Labs Lab 01/22/14 1527  NA 137  K 3.9  CL 99  CO2 21  GLUCOSE 132*  BUN 13  CREATININE 1.04  CALCIUM 8.9    CBC:  Recent Labs Lab 01/22/14 1523  WBC 7.3  NEUTROABS 4.1  HGB 14.7  HCT 45.2  MCV 91.7  PLT 190    Cardiac Enzymes:  Recent Labs Lab 01/22/14 1523 01/23/14 0112 01/23/14 0538  TROPONINI <0.30 <0.30 <0.30    Radiology: Dg Chest 2 View  01/22/2014   CLINICAL DATA:  63 year old male with chest pain and shortness of breath.  EXAM: CHEST  2 VIEW  COMPARISON:  09/24/2013 and prior chest radiographs dating back to 09/06/2004  FINDINGS: The cardiomediastinal silhouette is unremarkable.  Mild elevation of the right hemidiaphragm again noted.  There is no evidence of focal airspace disease, pulmonary edema, suspicious pulmonary nodule/mass, pleural effusion, or pneumothorax.  No acute bony abnormalities are identified.  IMPRESSION: No active cardiopulmonary disease.   Electronically  Signed   By: Hassan Rowan M.D.   On: 01/22/2014 15:38   Ct Angio Chest Aorta W/cm &/or Wo/cm  01/22/2014   CLINICAL DATA:  Chest pain, back pain, pain between shoulder blades, question dissection  EXAM: CT ANGIOGRAPHY CHEST, ABDOMEN AND PELVIS  IMPRESSION: No evidence of aortic aneurysm or dissection.  COPD changes without acute intra thoracic abnormality.  RIGHT inguinal hernia containing fat.  Bulging disc versus disk herniation at L4-L5 with AP narrowing of the spinal canal.  Postsurgical changes in abdomen without evidence acute intra-abdominal or intrapelvic findings.   Electronically Signed   By: Lavonia Dana M.D.   On: 01/22/2014 21:09   Ct Angio Abd/pel W/ And/or W/o  01/22/2014   CLINICAL DATA:  Chest pain, back pain, pain between shoulder blades, question dissection  EXAM: CT ANGIOGRAPHY CHEST, ABDOMEN AND  PELVIS  TECHNIQUE:.  IMPRESSION: No evidence of aortic aneurysm or dissection.  COPD changes without acute intra thoracic abnormality.  RIGHT inguinal hernia containing fat.  Bulging disc versus disk herniation at L4-L5 with AP narrowing of the spinal canal.  Postsurgical changes in abdomen without evidence acute intra-abdominal or intrapelvic findings.   Electronically Signed   By: Lavonia Dana M.D.   On: 01/22/2014 21:09     ECG: NSR non-specific inferior T-wave concavity is noted.   Impression and Recommendations  1. Chest Pain: Atypical features described as constant chest pressure over 2 days with sharp pains radiating to the back, and worsening epigastric pain. Denied diaphoresis dizziness or dyspnea. Had nausea and vomiting the day prior to admission with substernal chest pressure following this. Nitroglycerin has assisted with some of his substernal discomfort, although it continues, along with sharp pains in his back and neck.  Cardiac enzymes are found be negative, EKG is nonspecific arguing against ACS.  He does have cardiovascular risk factors for CAD to include family history, tobacco abuse, hypertension, age and male gender. No mention of cholesterol status.  Chest discomfort appears more likely GI etiology despite his cardiovascular risk factors. We will discontinue nitroglycerin as this has not resolved his chest pressure. Recommend  GI evaluation he has been seen by Dr. Laural Golden several occasions for ongoing GERD and abdominal pain. Can consider stress test on Monday, once he is stabilized and abdominal pain has been controlled.  Echocardiogram is pending. Should there be significant dysfunction, may consider other options for evaluation for CAD.Should patient need PCI, options are limited, with history of SDH in the past, and liver disease  2.  Hypertension: Blood pressure is moderately controlled. Is not on any anti-hypertensive medications prior to admission. He is taking a lot of  Advil and Robaxin for pain contribute to hypertension.  BP has been labile likely due to frequent morphine doses.  Consider adding low dose ACE once pain is controlled and is not on frequent dosing.   3. Chronic Back Pain: Neurologic pain as a result with ongoing neck pain, mid back pain and lumbar back pain. Has been on a lot of NSAIDs. Would not recommend this currently with GI issues. We will defer to GI for better recommendations.  4. Polysubstance Abuse: Tobacco and ETOH.  Cessation is recommended. Due to drinking would recommend DT prophylaxis protocol. Question ongoing abuse affecting overall abdominal pain and status.   5. COPD: Found on CT  Consider pulmonary evaluation as OP.  6. Abdominal Pain: Now on PPI and given GI cocktail. Has had multiple GI issues to include Chron;s disease, GERD, Alcoholic hepatitis, and gastroparesis.  Recommend GI eval at your discretion.  Most recent EDG completed in 07/2013 without evidence of PUD, but gastritis was noted in the gastrojejunostomy with patent befriend and efferent loops.  Consider lipase and amylase levels to evaluate for pancreatitis.    Signed: Phill Myron. Lawrence NP 01/23/2014, 12:01 PM Co-Sign MD   Attending note:  Patient seen and examined. Reviewed records and modified above note by Ms. Lawrence NP. Gary Vasquez presents with recurring, fairly prolonged episodes of chest discomfort, epigastric to midsternal and described as a tightness and burning. He does have a history of reflux, prior Nissen fundoplication, known gastroparesis. He has had associated nausea and emesis, also experiencing some back discomfort. No personal history of obstructive CAD, reportedly a remote cardiac workup several years ago. ECG is nonspecific, and cardiac markers are normal so far. Nitroglycerin is not having much effect on his symptoms. Certainly possible that his symptoms are GI in etiology based on substrate, although he does have cardiac risk factors and his  chest CTA although not demonstrate pulmonary embolus did demonstrate scattered atherosclerotic calcifications of the aorta and coronary arteries. Based on this, would ultimately recommend proceeding with a Lexiscan Cardiolite. If he is still in the hospital undergoing management and workup on Monday, would get this scheduled for then. In the meantime echocardiogram is being obtained to exclude any focal wall motion abnormalities.  Satira Sark, M.D., F.A.C.C.

## 2014-01-24 LAB — LIPID PANEL
CHOLESTEROL: 165 mg/dL (ref 0–200)
HDL: 48 mg/dL (ref >39)
LDL Cholesterol: 90 mg/dL (ref 0–99)
Total CHOL/HDL Ratio: 3.4 RATIO
Triglycerides: 135 mg/dL (ref <150)
VLDL: 27 mg/dL (ref 0–40)

## 2014-01-24 MED ORDER — TEMAZEPAM 7.5 MG PO CAPS: 7.5000 mg | ORAL_CAPSULE | Freq: Every evening | ORAL | Status: DC | PRN

## 2014-01-24 MED ORDER — ALPRAZOLAM 0.5 MG PO TABS: 0.5000 mg | ORAL_TABLET | Freq: Every day | ORAL | Status: DC | PRN

## 2014-01-24 MED ORDER — TRAMADOL HCL 50 MG PO TABS: 50.0000 mg | ORAL_TABLET | Freq: Four times a day (QID) | ORAL | Status: DC | PRN

## 2014-01-24 MED ORDER — ASPIRIN 325 MG PO TBEC: 325.0000 mg | DELAYED_RELEASE_TABLET | Freq: Every day | ORAL | Status: DC

## 2014-01-24 MED ORDER — RANITIDINE HCL 150 MG PO TABS: 150.0000 mg | ORAL_TABLET | Freq: Every day | ORAL | Status: DC

## 2014-01-24 MED ORDER — METHOCARBAMOL 500 MG PO TABS: 500.0000 mg | ORAL_TABLET | Freq: Three times a day (TID) | ORAL | Status: DC | PRN

## 2014-01-24 MED ORDER — OMEPRAZOLE 40 MG PO CPDR: 40.0000 mg | DELAYED_RELEASE_CAPSULE | Freq: Two times a day (BID) | ORAL | Status: DC

## 2014-01-24 NOTE — Discharge Summary (Signed)
Physician Discharge Summary  Gary Vasquez [id number] DOB: 04-Apr-1951 DOA: 01/22/2014  PCP: Gary Heater, MD  Admit date: 01/22/2014 Discharge date: 01/24/2014  Time spent: <66minutes  Recommendations for Outpatient Follow-up:  Follow-up Information   Follow up with Gary Houston, MD. (next week, call for appt upon discharge)    Specialty:  Gastroenterology   Contact information:   [place], [address] [place] [address] [phone]       Follow up with Gary Lesches, MD. (call on Monday to set up for Stress test)    Specialty:  Cardiology   Contact information:   [place] [address] [phone]       Please follow up. (PCP/ Dr Gary Vasquez at [place], call [phone] for appointment upon discharge)       Follow up with [facility], KOFI, MD. (/Neurologist, Call for appointment upon discharge)    Specialty:  Neurology   Contact information:   [address] [place] [address] [phone]       Discharge Diagnoses:  Principal Problem:   Chest pain, atypical Active Problems:   GERD   CROHN'S DISEASE, HX OF   NISSEN FUNDOPLICATION, LAPAROSCOPIC, HX OF   Chronic low back pain   Atypical chest pain   Discharge Condition: Improved/stable  Diet recommendation: Heart healthy  Filed Weights   01/23/14 0000 01/23/14 2132  Weight: 78.2 kg (172 lb 6.4 oz) 80.2 kg (176 lb 12.9 oz)    History of present illness:  Gary Vasquez is an 63 y.o. male with hx of cervical myelopathy and lumbar disc problems previously on chronic pain meds, but hadn't had it for several months as his doctors left town, hx of GERD s/p Nissen fundoplication with re-do, hx of crohn's disease, hx of GI bleed, tobacco abuse, presents to the ER with increase neck and back pain, with paresthesia of both toes, and substernal chest pain, some nausea and vomiting, but no shortness of breath. He said his neck and back pain has been chronic, but worsen these  past couple of days. His chest pain, nausea and vomiting was new. It has been intermittent and relieved with NTG. Evalatuion in the ER included an EKG with showed NSR and nonspecific ST T segments, a CTA of the chest showed no dissection and no PE, initial troponins were negative, and Abd CT showed no AAA. He did have L4 L5 disc herniation with narrow canal. He has no leg weakness, bowel or bladder incontinence. Serology was otherwise unremarkable with no leukocytosis, anemia, or renal insufficiency. EDP spoke with cardiology at [facility], and recommended that he be admitted here for r/out. Hospitalist was asked to admit him for atypical chest pain   Hospital Course:  1. Chest pain, atypical -As discussed above upon admission cardiac enzymes were cycled and came back negative x3, CT angio of chest negative for PE . He was placed on nitro drip on admission as well as aspirin. Cardiology was consulted and they saw the patient and discontinued the nitroglycerin and felt his symptoms were more likely GI related. Patient was placed on a PPI and GI cocktail as needed. Cardiology recommended electric and Cardiolite based on scattered atherosclerotic calcifications of the aorta and coronaries noted on the CT angiogram-plan was to Monday if patient was still in the hospital  -echo was done and showed and EF of [id number] grade 1 diastolic dysfunction. No major regurgitant valvular lesions. Unable to assess PASP -On followup today patient is clinically improved  has had no further chest pain off the nitroglycerin, and his back and neck pain and also improved. -Discussed patient with Dr. Mare Vasquez on call at [facility], And he agrees with plan for patient to call Dr. Myles Vasquez office on Monday to set up stress test outpatient. 2. GERD/status post Nissen fundoplication the past -He was placed on PPI, and his to continue to take Zantac at at bedtime on discharge. Patient states his gastroenterologist is Dr. Laural Vasquez that he saw him  last month, and has been instructed to follow up with him upon discharge. 3. Chronic back and neck pain -Continue pain management. His to followup with PCP-Dr. Ethlyn Vasquez  -CT abdomen and pelvis with broad-based bulging disc versus disc herniation with narrowing of the spinal at the L4-5  -Continue Robaxin, and being discharged on when necessary Ultram as well  -Patient states that he had been referred to Dr. Doonquah/neurology in the past and he was never able to set up that appointment and requested to be referred to for followup with him/Dr. Merlene Vasquez and this has been indicated in his followup instructions. 4. Tobacco abuse -Smoking cessation consult  5. H/o Hypertension -Was on no outpatient meds, BP remained controlled during this hospital stay, his to followup with his PCP for further monitoring and management as clinically appropriate. -Continue to monitor and further treat accordingly   Procedures:  2-D echo  Study Conclusions  - Left ventricle: The cavity size was normal. Wall thickness was increased in a pattern of mild LVH. Systolic function was vigorous. The estimated ejection fraction was in the range of 65% to 70%. Wall motion was normal; there were no regional wall motion abnormalities. Doppler parameters are consistent with abnormal left ventricular relaxation (grade 1 diastolic dysfunction). - Aortic valve: Mildly calcified annulus. Trileaflet. There was no significant regurgitation. - Mitral valve: Mildly thickened leaflets . There was trivial regurgitation. - Right atrium: Central venous pressure (est): 3 mm Hg. - Tricuspid valve: There was physiologic regurgitation. - Pulmonary arteries: Systolic pressure could not be accurately estimated. - Pericardium, extracardiac: There was no pericardial effusion.  Impressions:  - Mild LVH with LVEF 03-54%, grade 1 diastolic dysfunction. No major regurgitant valvular lesions. Unable to assess PASP. No pericardial  effusion.  Consultations:  Cardiology-Dr. Domenic Vasquez  Discharge Exam: Filed Vitals:   01/24/14 0550  BP: 108/69  Pulse:   Temp: 97.8 F (36.6 C)  Resp: 18   Exam:  General: alert & oriented x 3 In NAD  Cardiovascular: RRR, nl S1 s2  Respiratory: CTAB  Abdomen: soft +BS epigastric tenderness present/ND, no masses palpable  Extremities: No cyanosis and no edema   Discharge Instructions You were cared for by a hospitalist during your hospital stay. If you have any questions about your discharge medications or the care you received while you were in the hospital after you are discharged, you can call the unit and asked to speak with the hospitalist on call if the hospitalist that took care of you is not available. Once you are discharged, your primary care physician will handle any further medical issues. Please note that NO REFILLS for any discharge medications will be authorized once you are discharged, as it is imperative that you return to your primary care physician (or establish a relationship with a primary care physician if you do not have one) for your aftercare needs so that they can reassess your need for medications and monitor your lab values.  Discharge Instructions   Diet - low sodium heart healthy  Complete by:  As directed      Increase activity slowly    Complete by:  As directed             Medication List    STOP taking these medications       ibuprofen 200 MG tablet  Commonly known as:  ADVIL,MOTRIN     LORazepam 1 MG tablet  Commonly known as:  ATIVAN      TAKE these medications       ALPRAZolam 0.5 MG tablet  Commonly known as:  XANAX  Take 1 tablet (0.5 mg total) by mouth daily as needed for anxiety.     aspirin 325 MG EC tablet  Take 1 tablet (325 mg total) by mouth daily.     gabapentin 100 MG capsule  Commonly known as:  NEURONTIN  Take 100 mg by mouth 3 (three) times daily.     loratadine 10 MG tablet  Commonly known as:  CLARITIN   Take 10 mg by mouth daily.     methocarbamol 500 MG tablet  Commonly known as:  ROBAXIN  Take 1 tablet (500 mg total) by mouth 3 (three) times daily as needed for muscle spasms.     omeprazole 40 MG capsule  Commonly known as:  PRILOSEC  Take 1 capsule (40 mg total) by mouth 2 (two) times daily.     ranitidine 150 MG tablet  Commonly known as:  ZANTAC  Take 1 tablet (150 mg total) by mouth at bedtime.     temazepam 7.5 MG capsule  Commonly known as:  RESTORIL  Take 1 capsule (7.5 mg total) by mouth at bedtime as needed for sleep.     traMADol 50 MG tablet  Commonly known as:  ULTRAM  Take 1 tablet (50 mg total) by mouth every 6 (six) hours as needed.       Allergies  Allergen Reactions  . Acetaminophen Other (See Comments)    Liver issues       Follow-up Information   Follow up with REHMAN,NAJEEB U, MD. (next week, call for appt upon discharge)    Specialty:  Gastroenterology   Contact information:   [place], [place] [address] [phone]       Follow up with Gary Lesches, MD. (call on Monday to set up for Stress test)    Specialty:  Cardiology   Contact information:   [place] [address] [phone]       Please follow up. (PCP in 1-2weeks, call for appt upon discharge)        The results of significant diagnostics from this hospitalization (including imaging, microbiology, ancillary and laboratory) are listed below for reference.    Significant Diagnostic Studies: Dg Chest 2 View  01/22/2014   CLINICAL DATA:  63 year old male with chest pain and shortness of breath.  EXAM: CHEST  2 VIEW  COMPARISON:  09/24/2013 and prior chest radiographs dating back to 09/06/2004  FINDINGS: The cardiomediastinal silhouette is unremarkable.  Mild elevation of the right hemidiaphragm again noted.  There is no evidence of focal airspace disease, pulmonary edema, suspicious pulmonary nodule/mass, pleural effusion, or pneumothorax.  No  acute bony abnormalities are identified.  IMPRESSION: No active cardiopulmonary disease.   Electronically Signed   By: Hassan Rowan M.D.   On: 01/22/2014 15:38   Ct Angio Chest Aorta W/cm &/or Wo/cm  01/22/2014   CLINICAL DATA:  Chest pain, back pain, pain between shoulder blades, question  dissection  EXAM: CT ANGIOGRAPHY CHEST, ABDOMEN AND PELVIS  TECHNIQUE: Multidetector CT imaging of the chest was performed using the standard protocol during bolus administration of intravenous contrast. Multiplanar CT image reconstructions and MIPs were obtained to evaluate the vascular anatomy. Pre contrast images of the chest were obtained.  CONTRAST:  129mL OMNIPAQUE IOHEXOL 350 MG/ML SOLN IV ; oral contrast was not administered.  COMPARISON:  CT chest 09/17/2005, CT abdomen and pelvis 09/24/2013  FINDINGS: CTA CHEST FINDINGS  Scattered atherosclerotic calcifications aorta and coronary arteries on precontrast imaging.  No evidence of intramural hematoma on pre contrast images.  Normal aortic enhancement post contrast without aneurysm or dissection.  Pulmonary arteries well opacified and patent.  No evidence of pulmonary embolism.  No thoracic adenopathy.  Emphysematous changes greatest at apices.  No acute infiltrate, pleural effusion, pneumothorax, or mass/nodule.  Osseous structures unremarkable.  Review of the MIP images confirms the above findings.  CTA ABDOMEN AND PELVIS FINDINGS  Scattered atherosclerotic calcifications aorta without aneurysm or dissection.  Main arterial origins in the abdomen are patent.  Density at the gastroesophageal junction consistent with history of fundoplication.  Liver, spleen, pancreas, kidneys, and adrenal glands normal.  Post gastrojejunostomy.  Prior RIGHT hemicolectomy.  Stomach and bowel loops otherwise unremarkable.  Normal appearing bladder and ureters.  RIGHT inguinal hernia containing fat.  No mass, adenopathy, free fluid, or free air.  No acute osseous findings.  Broad-based bulging  disc versus disk herniation with narrowing of the spinal canal at L4-L5.  Review of the MIP images confirms the above findings.  IMPRESSION: No evidence of aortic aneurysm or dissection.  COPD changes without acute intra thoracic abnormality.  RIGHT inguinal hernia containing fat.  Bulging disc versus disk herniation at L4-L5 with AP narrowing of the spinal canal.  Postsurgical changes in abdomen without evidence acute intra-abdominal or intrapelvic findings.   Electronically Signed   By: Lavonia Dana M.D.   On: 01/22/2014 21:09   Ct Angio Abd/pel W/ And/or W/o  01/22/2014   CLINICAL DATA:  Chest pain, back pain, pain between shoulder blades, question dissection  EXAM: CT ANGIOGRAPHY CHEST, ABDOMEN AND PELVIS  TECHNIQUE: Multidetector CT imaging of the chest was performed using the standard protocol during bolus administration of intravenous contrast. Multiplanar CT image reconstructions and MIPs were obtained to evaluate the vascular anatomy. Pre contrast images of the chest were obtained.  CONTRAST:  172mL OMNIPAQUE IOHEXOL 350 MG/ML SOLN IV ; oral contrast was not administered.  COMPARISON:  CT chest 09/17/2005, CT abdomen and pelvis 09/24/2013  FINDINGS: CTA CHEST FINDINGS  Scattered atherosclerotic calcifications aorta and coronary arteries on precontrast imaging.  No evidence of intramural hematoma on pre contrast images.  Normal aortic enhancement post contrast without aneurysm or dissection.  Pulmonary arteries well opacified and patent.  No evidence of pulmonary embolism.  No thoracic adenopathy.  Emphysematous changes greatest at apices.  No acute infiltrate, pleural effusion, pneumothorax, or mass/nodule.  Osseous structures unremarkable.  Review of the MIP images confirms the above findings.  CTA ABDOMEN AND PELVIS FINDINGS  Scattered atherosclerotic calcifications aorta without aneurysm or dissection.  Main arterial origins in the abdomen are patent.  Density at the gastroesophageal junction  consistent with history of fundoplication.  Liver, spleen, pancreas, kidneys, and adrenal glands normal.  Post gastrojejunostomy.  Prior RIGHT hemicolectomy.  Stomach and bowel loops otherwise unremarkable.  Normal appearing bladder and ureters.  RIGHT inguinal hernia containing fat.  No mass, adenopathy, free fluid, or free air.  No  acute osseous findings.  Broad-based bulging disc versus disk herniation with narrowing of the spinal canal at L4-L5.  Review of the MIP images confirms the above findings.  IMPRESSION: No evidence of aortic aneurysm or dissection.  COPD changes without acute intra thoracic abnormality.  RIGHT inguinal hernia containing fat.  Bulging disc versus disk herniation at L4-L5 with AP narrowing of the spinal canal.  Postsurgical changes in abdomen without evidence acute intra-abdominal or intrapelvic findings.   Electronically Signed   By: Lavonia Dana M.D.   On: 01/22/2014 21:09    Microbiology: Recent Results (from the past 240 hour(s))  MRSA PCR SCREENING     Status: None   Collection Time    01/22/14 11:55 PM      Result Value Ref Range Status   MRSA by PCR NEGATIVE  NEGATIVE Final   Comment:            The GeneXpert MRSA Assay (FDA     approved for NASAL specimens     only), is one component of a     comprehensive MRSA colonization     surveillance program. It is not     intended to diagnose MRSA     infection nor to guide or     monitor treatment for     MRSA infections.     Labs: Basic Metabolic Panel:  Recent Labs Lab 01/22/14 1527  NA 137  K 3.9  CL 99  CO2 21  GLUCOSE 132*  BUN 13  CREATININE 1.04  CALCIUM 8.9   Liver Function Tests: No results found for this basename: AST, ALT, ALKPHOS, BILITOT, PROT, ALBUMIN,  in the last 168 hours No results found for this basename: LIPASE, AMYLASE,  in the last 168 hours No results found for this basename: AMMONIA,  in the last 168 hours CBC:  Recent Labs Lab 01/22/14 1523  WBC 7.3  NEUTROABS 4.1   HGB 14.7  HCT 45.2  MCV 91.7  PLT 190   Cardiac Enzymes:  Recent Labs Lab 01/22/14 1523 01/23/14 0112 01/23/14 0538 01/23/14 1250  TROPONINI <0.30 <0.30 <0.30 <0.30   BNP: BNP (last 3 results) No results found for this basename: PROBNP,  in the last 8760 hours CBG: No results found for this basename: GLUCAP,  in the last 168 hours     Signed:  Denaly Gatling C Tenise Stetler  [facility] 01/24/2014, 12:15 PM

## 2014-01-26 ENCOUNTER — Other Ambulatory Visit: Payer: Self-pay

## 2014-01-26 DIAGNOSIS — R079 Chest pain, unspecified: Secondary | ICD-10-CM

## 2014-01-30 ENCOUNTER — Encounter (HOSPITAL_COMMUNITY)
Admission: RE | Admit: 2014-01-30 | Discharge: 2014-01-30 | Disposition: A | Discharge status: Home / Self Care | Payer: Medicaid Other | Source: Ambulatory Visit | Attending: Cardiology | Admitting: Cardiology

## 2014-01-30 ENCOUNTER — Encounter (HOSPITAL_COMMUNITY): Payer: Self-pay

## 2014-01-30 ENCOUNTER — Ambulatory Visit (HOSPITAL_COMMUNITY)
Admission: RE | Admit: 2014-01-30 | Discharge: 2014-01-30 | Disposition: A | Discharge status: Home / Self Care | Payer: Medicaid Other | Source: Ambulatory Visit | Attending: Cardiology | Admitting: Cardiology

## 2014-01-30 DIAGNOSIS — I10 Essential (primary) hypertension: Secondary | ICD-10-CM | POA: Insufficient documentation

## 2014-01-30 DIAGNOSIS — F17200 Nicotine dependence, unspecified, uncomplicated: Secondary | ICD-10-CM | POA: Insufficient documentation

## 2014-01-30 DIAGNOSIS — R079 Chest pain, unspecified: Secondary | ICD-10-CM | POA: Insufficient documentation

## 2014-01-30 DIAGNOSIS — R0789 Other chest pain: Secondary | ICD-10-CM

## 2014-01-30 MED ORDER — SODIUM CHLORIDE 0.9 % IJ SOLN
INTRAMUSCULAR | Status: AC
  Administered 2014-01-30: 10 mL via INTRAVENOUS
  Filled 2014-01-30: qty 10

## 2014-01-30 MED ORDER — REGADENOSON 0.4 MG/5ML IV SOLN
INTRAVENOUS | Status: AC
  Administered 2014-01-30: 0.4 mg via INTRAVENOUS
  Filled 2014-01-30: qty 5

## 2014-01-30 MED ORDER — TECHNETIUM TC 99M SESTAMIBI GENERIC - CARDIOLITE
30.0000 | Freq: Once | INTRAVENOUS | Status: AC | PRN
  Administered 2014-01-30: 30 via INTRAVENOUS

## 2014-01-30 MED ORDER — TECHNETIUM TC 99M SESTAMIBI - CARDIOLITE
10.0000 | Freq: Once | INTRAVENOUS | Status: AC | PRN
  Administered 2014-01-30: 09:00:00 10 via INTRAVENOUS

## 2014-01-30 NOTE — Progress Notes (Signed)
Stress Lab Nurses Notes - Mylen Mangan 01/30/2014 Reason for doing test: Chest Pain Type of test: Wille Glaser Nurse performing test: Gerrit Halls, RN Nuclear Medicine Tech: Melburn Hake Echo Tech: Not Applicable MD performing test: Bronson Ing / K.Lawrence NP Family MD: Clydene Laming Test explained and consent signed: yes IV started: 22g jelco, Saline lock flushed, No redness or edema and Saline lock started in radiology Symptoms:chest tightness & nausea  Treatment/Intervention: None Reason test stopped: protocol completed After recovery IV was: Discontinued via X-ray tech and No redness or edema Patient to return to [facility]. Med at : 10:30 Patient discharged: Home Patient's Condition upon discharge was: stable Comments: During test BP 94/67 & HR 114 .  Recovery BP 126/75 & HR 97. Symptoms resolved in recovery. Donnajean Lopes

## 2014-02-04 ENCOUNTER — Telehealth (INDEPENDENT_AMBULATORY_CARE_PROVIDER_SITE_OTHER): Payer: Self-pay | Admitting: *Deleted

## 2014-02-04 ENCOUNTER — Ambulatory Visit (INDEPENDENT_AMBULATORY_CARE_PROVIDER_SITE_OTHER): Payer: Self-pay | Admitting: Internal Medicine

## 2014-02-04 NOTE — Telephone Encounter (Signed)
Makarios No Showed for his apt on 02/04/14 with Deberah Castle, NP.

## 2014-02-06 ENCOUNTER — Encounter: Payer: Self-pay | Admitting: Cardiology

## 2014-02-06 ENCOUNTER — Ambulatory Visit (INDEPENDENT_AMBULATORY_CARE_PROVIDER_SITE_OTHER): Payer: Medicaid Other | Admitting: Cardiology

## 2014-02-06 ENCOUNTER — Encounter (INDEPENDENT_AMBULATORY_CARE_PROVIDER_SITE_OTHER): Payer: Self-pay | Admitting: *Deleted

## 2014-02-06 VITALS — BP 122/92 | HR 92 | Ht 72.0 in | Wt 174.0 lb

## 2014-02-06 DIAGNOSIS — R0789 Other chest pain: Secondary | ICD-10-CM

## 2014-02-06 NOTE — Progress Notes (Signed)
Clinical Summary Gary Vasquez is a 63 y.o.male seen recently as an inpatient consult in late May with atypical chest pain. He ruled out for ACS, symptoms were felt to be potentially more GI in etiology - history of Nissen fundoplication, gastroparesis, also associated nausea and emesis. Incidentally, he had evidence of scattered atherosclerotic calcifications of the aorta and coronary arteries by chest CT, therefore followup testing was arranged.  Echocardiogram on May 29 demonstrated mild LVH with LVEF 28-41%, grade 1 diastolic dysfunction, no major valvular abnormalities, unable to assess PASP, no pericardial effusion. Lexiscan Cardiolite done on June 5 was normal, no evidence of scar or ischemia with LVEF 73%.  We reviewed the results of his tests today - overall reassuring from a cardiac perspective. He states he has pending followup with Dr. Laural Golden. I have recommended a basic walking regimen for exercise. No further cardiac testing at this time.   Allergies  Allergen Reactions  . Acetaminophen Other (See Comments)    Liver issues    Current Outpatient Prescriptions  Medication Sig Dispense Refill  . ALPRAZolam (XANAX) 0.5 MG tablet Take 1 tablet (0.5 mg total) by mouth daily as needed for anxiety.  20 tablet  0  . aspirin EC 325 MG EC tablet Take 1 tablet (325 mg total) by mouth daily.  30 tablet  0  . gabapentin (NEURONTIN) 100 MG capsule Take 100 mg by mouth 3 (three) times daily.      Marland Kitchen loratadine (CLARITIN) 10 MG tablet Take 10 mg by mouth daily.      . methocarbamol (ROBAXIN) 500 MG tablet Take 1 tablet (500 mg total) by mouth 3 (three) times daily as needed for muscle spasms.  30 tablet  0  . omeprazole (PRILOSEC) 40 MG capsule Take 1 capsule (40 mg total) by mouth 2 (two) times daily.  60 capsule  0  . ranitidine (ZANTAC) 150 MG tablet Take 1 tablet (150 mg total) by mouth at bedtime.  60 tablet  5  . temazepam (RESTORIL) 7.5 MG capsule Take 1 capsule (7.5 mg total) by mouth at  bedtime as needed for sleep.  20 capsule  0  . traMADol (ULTRAM) 50 MG tablet Take 1 tablet (50 mg total) by mouth every 6 (six) hours as needed.  30 tablet  0   No current facility-administered medications for this visit.    Past Medical History  Diagnosis Date  . Crohn disease   . Acid reflux   . Essential hypertension, benign   . Alcoholic hepatitis   . Arthritis   . Dumping syndrome   . Paraesophageal hernia   . Chronic diarrhea   . Subdural hematoma   . Gastroparesis   . Migraine headache   . Chronic back pain   . TIA (transient ischemic attack)     Social History Gary Vasquez reports that he has been smoking Cigarettes.  He has been smoking about 0.25 packs per day. He has never used smokeless tobacco. Gary Vasquez reports that he drinks alcohol.  Review of Systems Reflux symptoms, some dysphasia. No palpitations. States his heart rate has always been "a little fast." No dizziness or syncope. Otherwise as outlined.  Physical Examination Filed Vitals:   02/06/14 1257  BP: 122/92  Pulse: 92   Filed Weights   02/06/14 1257  Weight: 174 lb (78.926 kg)   Appears comfortable at rest. HEENT: Conjunctiva and lids normal, oropharynx clear. Neck: Supple, no elevated JVP or carotid bruits, no thyromegaly. Lungs: Clear to auscultation,  nonlabored breathing at rest. Cardiac: Regular rate and rhythm, no S3 or significant systolic murmur, no pericardial rub. Abdomen: Soft, nontender, bowel sounds present. Extremities: No pitting edema, distal pulses 2+.   Problem List and Plan   Atypical chest pain Resolved at this point. Recent cardiac testing including echocardiogram and Cardiolite was reassuring, no clear evidence of ischemic heart disease as etiology. No further cardiac testing is being pursued at this time. Keep follow with primary care and gastroenterology.    Satira Sark, M.D., F.A.C.C.

## 2014-02-06 NOTE — Patient Instructions (Signed)
Your physician recommends that you schedule a follow-up appointment in:  As needed  Your physician recommends that you continue on your current medications as directed. Please refer to the Current Medication list given to you today.  

## 2014-02-06 NOTE — Assessment & Plan Note (Signed)
Resolved at this point. Recent cardiac testing including echocardiogram and Cardiolite was reassuring, no clear evidence of ischemic heart disease as etiology. No further cardiac testing is being pursued at this time. Keep follow with primary care and gastroenterology.

## 2014-02-08 NOTE — Progress Notes (Signed)
Dr. Myles Gip note appreciated

## 2014-02-12 ENCOUNTER — Other Ambulatory Visit (INDEPENDENT_AMBULATORY_CARE_PROVIDER_SITE_OTHER): Payer: Self-pay | Admitting: Internal Medicine

## 2014-02-12 NOTE — Telephone Encounter (Signed)
Per Dr.Rehman may fill with 1 additional refill. 

## 2014-02-16 ENCOUNTER — Ambulatory Visit (INDEPENDENT_AMBULATORY_CARE_PROVIDER_SITE_OTHER): Payer: Self-pay | Admitting: Internal Medicine

## 2014-02-16 ENCOUNTER — Telehealth (INDEPENDENT_AMBULATORY_CARE_PROVIDER_SITE_OTHER): Payer: Self-pay | Admitting: *Deleted

## 2014-02-16 NOTE — Telephone Encounter (Signed)
Kayn No Showed for his apt on 02/16/14 with Deberah Castle, NP.

## 2014-02-18 ENCOUNTER — Ambulatory Visit (INDEPENDENT_AMBULATORY_CARE_PROVIDER_SITE_OTHER): Payer: Medicaid Other | Admitting: Internal Medicine

## 2014-02-18 ENCOUNTER — Encounter (INDEPENDENT_AMBULATORY_CARE_PROVIDER_SITE_OTHER): Payer: Self-pay | Admitting: Internal Medicine

## 2014-02-18 VITALS — BP 116/80 | HR 76 | Temp 97.6°F | Ht 72.0 in | Wt 175.8 lb

## 2014-02-18 DIAGNOSIS — R1114 Bilious vomiting: Secondary | ICD-10-CM

## 2014-02-18 DIAGNOSIS — K860 Alcohol-induced chronic pancreatitis: Secondary | ICD-10-CM

## 2014-02-18 DIAGNOSIS — K861 Other chronic pancreatitis: Secondary | ICD-10-CM

## 2014-02-18 DIAGNOSIS — K5090 Crohn's disease, unspecified, without complications: Secondary | ICD-10-CM

## 2014-02-18 NOTE — Patient Instructions (Signed)
Continue present medications. OV in 6 months. Need to stop drinking.

## 2014-02-18 NOTE — Progress Notes (Signed)
Subjective:     Patient ID: Gary Vasquez, male   DOB: 26-May-1951, 63 y.o.   MRN: [id number]  HPIHere today for f/u. Hx of Crohn's disease. He tells me he is doing okay. He tells me his appetite is fair. He says he has lost 5 pounds in the past month. Admitted to [facility] weeks for chest pain. Cardiac work up was negative. Stress test and Echo cardiogram was normal. No abdominal pain. He usually has a BM about 4-6 times a day. No melena or bright red rectal bleeding. He continues to drink about 3 beers a day.  He continues to have nausea and vomiting. No hematemesis. He says when he vomits it is usually bile. Stop Carafate because it really did not help. Recently started on Neurontin for leg pain and tingling in his toes.   Weight 11/2013 171. Today weight 175.8    08/14/2013 EGD: Impression:  No evidence of erosive esophagitis.  Significant amount of bile noted in gastric cavity.  Gastritis predominately at the gastrojejunostomy with patent befriend and efferent loops.  Patent pyloric channel.  07/12/2013 Colonoscopy: Impression:  Normal mucosa of terminal ileum.  Wide-open ileocolonic anastomosis.  Single small polyp ablated via cold biopsy from sigmoid colon.  Small external hemorrhoids.  Crohn's disease remains in remission.   CBC    Component Value Date/Time   WBC 7.3 01/22/2014 1523   RBC 4.93 01/22/2014 1523   HGB 14.7 01/22/2014 1523   HCT 45.2 01/22/2014 1523   PLT 190 01/22/2014 1523   MCV 91.7 01/22/2014 1523   MCH 29.8 01/22/2014 1523   MCHC 32.5 01/22/2014 1523   RDW 13.3 01/22/2014 1523   LYMPHSABS 2.4 01/22/2014 1523   MONOABS 0.7 01/22/2014 1523   EOSABS 0.1 01/22/2014 1523   BASOSABS 0.0 01/22/2014 1523         Review of Systems     Past Medical History  Diagnosis Date  . Crohn disease   . Acid reflux   . Essential hypertension, benign   . Alcoholic hepatitis   . Arthritis   . Dumping syndrome   . Paraesophageal hernia   . Chronic diarrhea   . Subdural  hematoma   . Gastroparesis   . Migraine headache   . Chronic back pain   . TIA (transient ischemic attack)     Past Surgical History  Procedure Laterality Date  . Nissen fundoplication    . Colon resection Right   . Pyloroplasty  2009  . Colonoscopy N/A 07/02/2013    Procedure: COLONOSCOPY;  Surgeon: Rogene Houston, MD;  Location: [facility];  Service: Endoscopy;  Laterality: N/A;  200  . Esophagogastroduodenoscopy N/A 08/14/2013    Procedure: ESOPHAGOGASTRODUODENOSCOPY (EGD);  Surgeon: Rogene Houston, MD;  Location: [facility];  Service: Endoscopy;  Laterality: N/A;  300    Allergies  Allergen Reactions  . Acetaminophen Other (See Comments)    Liver issues    Current Outpatient Prescriptions on File Prior to Visit  Medication Sig Dispense Refill  . ALPRAZolam (XANAX) 0.5 MG tablet Take 1 tablet (0.5 mg total) by mouth daily as needed for anxiety.  20 tablet  0  . aspirin EC 325 MG EC tablet Take 1 tablet (325 mg total) by mouth daily.  30 tablet  0  . gabapentin (NEURONTIN) 100 MG capsule Take 100 mg by mouth 3 (three) times daily.      Marland Kitchen loratadine (CLARITIN) 10 MG tablet Take 10 mg by mouth daily.      Marland Kitchen  LORazepam (ATIVAN) 1 MG tablet TAKE ONE TABLET DAILY AT BEDTIME  30 tablet  1  . methocarbamol (ROBAXIN) 500 MG tablet Take 1 tablet (500 mg total) by mouth 3 (three) times daily as needed for muscle spasms.  30 tablet  0  . omeprazole (PRILOSEC) 40 MG capsule Take 1 capsule (40 mg total) by mouth 2 (two) times daily.  60 capsule  0  . ranitidine (ZANTAC) 150 MG tablet Take 1 tablet (150 mg total) by mouth at bedtime.  60 tablet  5  . temazepam (RESTORIL) 7.5 MG capsule Take 1 capsule (7.5 mg total) by mouth at bedtime as needed for sleep.  20 capsule  0  . traMADol (ULTRAM) 50 MG tablet Take 1 tablet (50 mg total) by mouth every 6 (six) hours as needed.  30 tablet  0   No current facility-administered medications on file prior to visit.    Past Medical History   Diagnosis Date  . Crohn disease   . Acid reflux   . Essential hypertension, benign   . Alcoholic hepatitis   . Arthritis   . Dumping syndrome   . Paraesophageal hernia   . Chronic diarrhea   . Subdural hematoma   . Gastroparesis   . Migraine headache   . Chronic back pain   . TIA (transient ischemic attack)     Past Surgical History  Procedure Laterality Date  . Nissen fundoplication    . Colon resection Right   . Pyloroplasty  2009  . Colonoscopy N/A 07/02/2013    Procedure: COLONOSCOPY;  Surgeon: Rogene Houston, MD;  Location: [facility];  Service: Endoscopy;  Laterality: N/A;  200  . Esophagogastroduodenoscopy N/A 08/14/2013    Procedure: ESOPHAGOGASTRODUODENOSCOPY (EGD);  Surgeon: Rogene Houston, MD;  Location: [facility];  Service: Endoscopy;  Laterality: N/A;  300    Allergies  Allergen Reactions  . Acetaminophen Other (See Comments)    Liver issues    Current Outpatient Prescriptions on File Prior to Visit  Medication Sig Dispense Refill  . ALPRAZolam (XANAX) 0.5 MG tablet Take 1 tablet (0.5 mg total) by mouth daily as needed for anxiety.  20 tablet  0  . aspirin EC 325 MG EC tablet Take 1 tablet (325 mg total) by mouth daily.  30 tablet  0  . gabapentin (NEURONTIN) 100 MG capsule Take 100 mg by mouth 3 (three) times daily.      Marland Kitchen loratadine (CLARITIN) 10 MG tablet Take 10 mg by mouth daily.      Marland Kitchen LORazepam (ATIVAN) 1 MG tablet TAKE ONE TABLET DAILY AT BEDTIME  30 tablet  1  . methocarbamol (ROBAXIN) 500 MG tablet Take 1 tablet (500 mg total) by mouth 3 (three) times daily as needed for muscle spasms.  30 tablet  0  . omeprazole (PRILOSEC) 40 MG capsule Take 1 capsule (40 mg total) by mouth 2 (two) times daily.  60 capsule  0  . ranitidine (ZANTAC) 150 MG tablet Take 1 tablet (150 mg total) by mouth at bedtime.  60 tablet  5  . temazepam (RESTORIL) 7.5 MG capsule Take 1 capsule (7.5 mg total) by mouth at bedtime as needed for sleep.  20 capsule  0  .  traMADol (ULTRAM) 50 MG tablet Take 1 tablet (50 mg total) by mouth every 6 (six) hours as needed.  30 tablet  0   No current facility-administered medications on file prior to visit.    Past Medical History  Diagnosis  Date  . Crohn disease   . Acid reflux   . Essential hypertension, benign   . Alcoholic hepatitis   . Arthritis   . Dumping syndrome   . Paraesophageal hernia   . Chronic diarrhea   . Subdural hematoma   . Gastroparesis   . Migraine headache   . Chronic back pain   . TIA (transient ischemic attack)     Past Surgical History  Procedure Laterality Date  . Nissen fundoplication    . Colon resection Right   . Pyloroplasty  2009  . Colonoscopy N/A 07/02/2013    Procedure: COLONOSCOPY;  Surgeon: Rogene Houston, MD;  Location: [facility];  Service: Endoscopy;  Laterality: N/A;  200  . Esophagogastroduodenoscopy N/A 08/14/2013    Procedure: ESOPHAGOGASTRODUODENOSCOPY (EGD);  Surgeon: Rogene Houston, MD;  Location: [facility];  Service: Endoscopy;  Laterality: N/A;  300    Allergies  Allergen Reactions  . Acetaminophen Other (See Comments)    Liver issues    Current Outpatient Prescriptions on File Prior to Visit  Medication Sig Dispense Refill  . ALPRAZolam (XANAX) 0.5 MG tablet Take 1 tablet (0.5 mg total) by mouth daily as needed for anxiety.  20 tablet  0  . aspirin EC 325 MG EC tablet Take 1 tablet (325 mg total) by mouth daily.  30 tablet  0  . gabapentin (NEURONTIN) 100 MG capsule Take 100 mg by mouth 3 (three) times daily.      Marland Kitchen loratadine (CLARITIN) 10 MG tablet Take 10 mg by mouth daily.      Marland Kitchen LORazepam (ATIVAN) 1 MG tablet TAKE ONE TABLET DAILY AT BEDTIME  30 tablet  1  . methocarbamol (ROBAXIN) 500 MG tablet Take 1 tablet (500 mg total) by mouth 3 (three) times daily as needed for muscle spasms.  30 tablet  0  . omeprazole (PRILOSEC) 40 MG capsule Take 1 capsule (40 mg total) by mouth 2 (two) times daily.  60 capsule  0  . ranitidine (ZANTAC)  150 MG tablet Take 1 tablet (150 mg total) by mouth at bedtime.  60 tablet  5  . temazepam (RESTORIL) 7.5 MG capsule Take 1 capsule (7.5 mg total) by mouth at bedtime as needed for sleep.  20 capsule  0  . traMADol (ULTRAM) 50 MG tablet Take 1 tablet (50 mg total) by mouth every 6 (six) hours as needed.  30 tablet  0   No current facility-administered medications on file prior to visit.     Objective:   Physical Exam Filed Vitals:   02/18/14 0949  BP: 116/80  Pulse: 76  Temp: 97.6 F (36.4 C)  Height: 6' (1.829 m)  Weight: 175 lb 12.8 oz (79.742 kg)  Alert and oriented. Skin warm and dry. Oral mucosa is moist.   . Sclera anicteric, conjunctivae is pink. Thyroid not enlarged. No cervical lymphadenopathy. Lungs clear. Heart regular rate and rhythm.  Abdomen is soft. Bowel sounds are positive. No hepatomegaly. No abdominal masses felt. No tenderness.  No edema to lower extremities.       Assessment:        #1. N/V.This is a chronic symptom most likely secondary to bile reflux. He continues to drink etoh. #2. 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    #4. H/O pancreatitis.He continues to drink, He must stop drinking.      Plan:     Needs to stop drink. Continue  present medications. OV in August with Dr. Laural Golden

## 2014-03-09 ENCOUNTER — Other Ambulatory Visit (INDEPENDENT_AMBULATORY_CARE_PROVIDER_SITE_OTHER): Payer: Self-pay | Admitting: Internal Medicine

## 2014-03-09 MED ORDER — TEMAZEPAM 7.5 MG PO CAPS: 7.5000 mg | ORAL_CAPSULE | Freq: Every evening | ORAL | Status: DC | PRN

## 2014-03-19 ENCOUNTER — Telehealth (INDEPENDENT_AMBULATORY_CARE_PROVIDER_SITE_OTHER): Payer: Self-pay | Admitting: *Deleted

## 2014-03-19 NOTE — Telephone Encounter (Signed)
Prescription was first written on 03/09/14 by Lelon Perla for Temazepam 7.5 mg - Take 1 by mouth every night as needed for sleep. #20 with no refills.  This was after she had talked with the patient on the phone. Patient's insurance states that because it was written for 7.5 mg, this is why it needs prior authorization. It should be written for 15 mg or 30 mg. After talking with Karna Christmas , she ask that this be addressed by Dr.Rehman. Patient has an appointment with Dr.Rehman 04/19/14.  Per Dr.Rehman may call in Temazepam 15 mg - take 1 by mouth every night prn sleep  #30 no refills. This was called to [facility].

## 2014-03-30 ENCOUNTER — Emergency Department (HOSPITAL_COMMUNITY): Payer: Medicaid Other

## 2014-03-30 ENCOUNTER — Encounter (HOSPITAL_COMMUNITY): Payer: Self-pay | Admitting: Emergency Medicine

## 2014-03-30 ENCOUNTER — Emergency Department (HOSPITAL_COMMUNITY)
Admission: EM | Admit: 2014-03-30 | Discharge: 2014-03-30 | Disposition: A | Discharge status: Home / Self Care | Payer: Medicaid Other | Attending: Emergency Medicine | Admitting: Emergency Medicine

## 2014-03-30 DIAGNOSIS — Z79899 Other long term (current) drug therapy: Secondary | ICD-10-CM | POA: Diagnosis not present

## 2014-03-30 DIAGNOSIS — R109 Unspecified abdominal pain: Secondary | ICD-10-CM | POA: Diagnosis present

## 2014-03-30 DIAGNOSIS — G8929 Other chronic pain: Secondary | ICD-10-CM | POA: Insufficient documentation

## 2014-03-30 DIAGNOSIS — Z7982 Long term (current) use of aspirin: Secondary | ICD-10-CM | POA: Insufficient documentation

## 2014-03-30 DIAGNOSIS — Z8673 Personal history of transient ischemic attack (TIA), and cerebral infarction without residual deficits: Secondary | ICD-10-CM | POA: Insufficient documentation

## 2014-03-30 DIAGNOSIS — M549 Dorsalgia, unspecified: Secondary | ICD-10-CM | POA: Insufficient documentation

## 2014-03-30 DIAGNOSIS — F17200 Nicotine dependence, unspecified, uncomplicated: Secondary | ICD-10-CM | POA: Insufficient documentation

## 2014-03-30 DIAGNOSIS — R0789 Other chest pain: Secondary | ICD-10-CM

## 2014-03-30 DIAGNOSIS — R072 Precordial pain: Secondary | ICD-10-CM | POA: Diagnosis not present

## 2014-03-30 DIAGNOSIS — M129 Arthropathy, unspecified: Secondary | ICD-10-CM | POA: Insufficient documentation

## 2014-03-30 DIAGNOSIS — K219 Gastro-esophageal reflux disease without esophagitis: Secondary | ICD-10-CM | POA: Diagnosis not present

## 2014-03-30 DIAGNOSIS — Z8679 Personal history of other diseases of the circulatory system: Secondary | ICD-10-CM | POA: Diagnosis not present

## 2014-03-30 DIAGNOSIS — I10 Essential (primary) hypertension: Secondary | ICD-10-CM | POA: Diagnosis not present

## 2014-03-30 LAB — BASIC METABOLIC PANEL
ANION GAP: 13 (ref 5–15)
BUN: 9 mg/dL (ref 6–23)
CALCIUM: 9.3 mg/dL (ref 8.4–10.5)
CO2: 24 mEq/L (ref 19–32)
CREATININE: 1.06 mg/dL (ref 0.50–1.35)
Chloride: 101 mEq/L (ref 96–112)
GFR calc Af Amer: 86 mL/min — ABNORMAL LOW (ref >90)
GFR, EST NON AFRICAN AMERICAN: 74 mL/min — AB (ref >90)
Glucose, Bld: 101 mg/dL — ABNORMAL HIGH (ref 70–99)
Potassium: 4.3 mEq/L (ref 3.7–5.3)
SODIUM: 138 meq/L (ref 137–147)

## 2014-03-30 LAB — URINALYSIS, ROUTINE W REFLEX MICROSCOPIC
BILIRUBIN URINE: NEGATIVE
Glucose, UA: NEGATIVE mg/dL
Hgb urine dipstick: NEGATIVE
Ketones, ur: NEGATIVE mg/dL
LEUKOCYTES UA: NEGATIVE
Nitrite: NEGATIVE
PROTEIN: NEGATIVE mg/dL
Specific Gravity, Urine: <1.005 — ABNORMAL LOW (ref 1.005–1.030)
Urobilinogen, UA: 0.2 mg/dL (ref 0.0–1.0)
pH: 5.5 (ref 5.0–8.0)

## 2014-03-30 LAB — CBC WITH DIFFERENTIAL/PLATELET
BASOS ABS: 0.1 10*3/uL (ref 0.0–0.1)
Basophils Relative: 1 % (ref 0–1)
EOS ABS: 0.1 10*3/uL (ref 0.0–0.7)
EOS PCT: 1 % (ref 0–5)
HCT: 46.9 % (ref 39.0–52.0)
Hemoglobin: 15.9 g/dL (ref 13.0–17.0)
Lymphocytes Relative: 39 % (ref 12–46)
Lymphs Abs: 3.1 10*3/uL (ref 0.7–4.0)
MCH: 29.3 pg (ref 26.0–34.0)
MCHC: 33.9 g/dL (ref 30.0–36.0)
MCV: 86.5 fL (ref 78.0–100.0)
Monocytes Absolute: 0.6 10*3/uL (ref 0.1–1.0)
Monocytes Relative: 7 % (ref 3–12)
Neutro Abs: 4.1 10*3/uL (ref 1.7–7.7)
Neutrophils Relative %: 52 % (ref 43–77)
PLATELETS: 202 10*3/uL (ref 150–400)
RBC: 5.42 MIL/uL (ref 4.22–5.81)
RDW: 14.6 % (ref 11.5–15.5)
WBC: 7.9 10*3/uL (ref 4.0–10.5)

## 2014-03-30 LAB — TROPONIN I: Troponin I: <0.3 ng/mL (ref <0.30)

## 2014-03-30 LAB — D-DIMER, QUANTITATIVE: D-Dimer, Quant: 0.38 ug/mL-FEU (ref 0.00–0.48)

## 2014-03-30 MED ORDER — ONDANSETRON HCL 4 MG/2ML IJ SOLN
4.0000 mg | Freq: Once | INTRAMUSCULAR | Status: AC
  Administered 2014-03-30: 4 mg via INTRAMUSCULAR
  Filled 2014-03-30: qty 2

## 2014-03-30 MED ORDER — MORPHINE SULFATE 4 MG/ML IJ SOLN
4.0000 mg | Freq: Once | INTRAMUSCULAR | Status: AC
  Administered 2014-03-30: 4 mg via INTRAVENOUS
  Filled 2014-03-30: qty 1

## 2014-03-30 MED ORDER — KETOROLAC TROMETHAMINE 30 MG/ML IJ SOLN
30.0000 mg | Freq: Once | INTRAMUSCULAR | Status: AC
  Administered 2014-03-30: 30 mg via INTRAVENOUS
  Filled 2014-03-30: qty 1

## 2014-03-30 MED ORDER — OXYCODONE-ACETAMINOPHEN 5-325 MG PO TABS: 1.0000 | ORAL_TABLET | Freq: Four times a day (QID) | ORAL | Status: DC | PRN

## 2014-03-30 MED ORDER — HYDROMORPHONE HCL PF 1 MG/ML IJ SOLN
1.0000 mg | Freq: Once | INTRAMUSCULAR | Status: AC
  Administered 2014-03-30: 1 mg via INTRAVENOUS
  Filled 2014-03-30: qty 1

## 2014-03-30 NOTE — Discharge Instructions (Signed)

## 2014-03-30 NOTE — ED Provider Notes (Signed)
CSN: [id number]     Arrival date & time 03/30/14  1112 History   First MD Initiated Contact with Patient 03/30/14 1136    This chart was scribed for No att. providers found by Terressa Koyanagi, ED Scribe. This patient was seen in room [facility] and the patient's care was started at 11:54 AM.  Chief Complaint  Patient presents with  . Flank Pain  PCP: No PCP Per Patient  HPI HPI Comments: Gary Vasquez is a 63 y.o. male, with an extensive medical Hx noted below, who presents to the Emergency Department complaining of severe flank/mid-lower back pain onset yesterday. Pt rates his pain a 11 out of 10. Pt reports that starting today his pain is made worse with movement, laughter, or deep inhalations/exhalations. Pt denies any specific injury to his back. Pt reports taking Aleve at home without much relief. Pt denies any recent cough, fever, rash, SOB, urinary Sx, swelling of LE, Hx of blood clots, numbness or tingling.   Patient reports that he's supposed to be seen by a pain management doctor but "doesn't like pain medication."  Past Medical History  Diagnosis Date  . Crohn disease   . Acid reflux   . Essential hypertension, benign   . Alcoholic hepatitis   . Arthritis   . Dumping syndrome   . Paraesophageal hernia   . Chronic diarrhea   . Subdural hematoma   . Gastroparesis   . Migraine headache   . Chronic back pain   . TIA (transient ischemic attack)    Past Surgical History  Procedure Laterality Date  . Nissen fundoplication    . Colon resection Right   . Pyloroplasty  2009  . Colonoscopy N/A 07/02/2013    Procedure: COLONOSCOPY;  Surgeon: Rogene Houston, MD;  Location: [facility];  Service: Endoscopy;  Laterality: N/A;  200  . Esophagogastroduodenoscopy N/A 08/14/2013    Procedure: ESOPHAGOGASTRODUODENOSCOPY (EGD);  Surgeon: Rogene Houston, MD;  Location: [facility];  Service: Endoscopy;  Laterality: N/A;  300   Family History  Problem Relation Age of Onset  .  Healthy Daughter   . Healthy Son   . Heart attack Mother     CABG  . Heart attack Father     CABG  . Heart attack Brother     CABG   History  Substance Use Topics  . Smoking status: Current Every Day Smoker -- 0.25 packs/day for 20 years    Types: Cigarettes  . Smokeless tobacco: Never Used     Comment: 2-3 cigarrettes a day  . Alcohol Use: 0.0 oz/week    3-4 drink(s) per week    Review of Systems  Constitutional: Negative.  Negative for fever.  Respiratory: Negative.  Negative for chest tightness and shortness of breath.   Cardiovascular: Negative.  Negative for chest pain.  Gastrointestinal: Negative.  Negative for nausea, vomiting and abdominal pain.  Genitourinary: Positive for flank pain. Negative for dysuria.  Musculoskeletal: Positive for back pain.  Neurological: Negative for weakness, numbness and headaches.  All other systems reviewed and are negative.     Allergies  Acetaminophen  Home Medications   Prior to Admission medications   Medication Sig Start Date End Date Taking? Authorizing Provider  aspirin EC 325 MG EC tablet Take 1 tablet (325 mg total) by mouth daily. 01/24/14  Yes Adeline Saralyn Pilar, MD  ibuprofen (ADVIL,MOTRIN) 200 MG tablet Take 600 mg by mouth every 6 (six) hours as needed for moderate pain.  Yes Historical Provider, MD  loratadine (CLARITIN) 10 MG tablet Take 10 mg by mouth daily as needed for allergies.    Yes Historical Provider, MD  LORazepam (ATIVAN) 1 MG tablet TAKE ONE TABLET DAILY AT BEDTIME 02/12/14  Yes Rogene Houston, MD  Multiple Vitamin (MULTIVITAMIN WITH MINERALS) TABS tablet Take 1 tablet by mouth daily.   Yes Historical Provider, MD  omeprazole (PRILOSEC) 40 MG capsule Take 1 capsule (40 mg total) by mouth 2 (two) times daily. 01/24/14  Yes Adeline Saralyn Pilar, MD  ranitidine (ZANTAC) 150 MG tablet Take 1 tablet (150 mg total) by mouth at bedtime. 01/24/14  Yes Adeline C Viyuoh, MD  temazepam (RESTORIL) 7.5 MG capsule Take 1  capsule (7.5 mg total) by mouth at bedtime as needed for sleep. 03/09/14  Yes Butch Penny, NP  oxyCODONE-acetaminophen (PERCOCET/ROXICET) 5-325 MG per tablet Take 1-2 tablets by mouth every 6 (six) hours as needed for moderate pain or severe pain. 03/30/14   Merryl Hacker, MD   Triage Vitals: BP 136/89  Pulse 87  Temp(Src) 98.2 F (36.8 C) (Oral)  Ht 6' 1"  (1.854 m)  Wt 180 lb (81.647 kg)  BMI 23.75 kg/m2  SpO2 98% Physical Exam  Nursing note and vitals reviewed. Constitutional: He is oriented to person, place, and time. He appears well-developed and well-nourished. No distress.  HENT:  Head: Normocephalic and atraumatic.  Cardiovascular: Normal rate, regular rhythm and normal heart sounds.   No murmur heard. Pulmonary/Chest: Effort normal and breath sounds normal. No respiratory distress. He has no wheezes. He exhibits tenderness.  Tenderness to palpation over the left chest wall and back, no overlying skin changes, no crepitus  Abdominal: Soft. Bowel sounds are normal. There is no tenderness. There is no rebound.  Musculoskeletal: He exhibits no edema.  The midline tenderness to palpation, no paraspinous muscle region tenderness  Lymphadenopathy:    He has no cervical adenopathy.  Neurological: He is alert and oriented to person, place, and time.  Skin: Skin is warm and dry.  Psychiatric: He has a normal mood and affect.    ED Course  Procedures (including critical care time) DIAGNOSTIC STUDIES: Oxygen Saturation is 98% on RA, nl by my interpretation.    COORDINATION OF CARE: 11:58 AM-Discussed treatment plan which includes meds, imaging and labs with pt at bedside and pt agreed to plan.   Labs Review Labs Reviewed  BASIC METABOLIC PANEL - Abnormal; Notable for the following:    Glucose, Bld 101 (*)    GFR calc non Af Amer 74 (*)    GFR calc Af Amer 86 (*)    All other components within normal limits  URINALYSIS, ROUTINE W REFLEX MICROSCOPIC - Abnormal; Notable for  the following:    Specific Gravity, Urine <1.005 (*)    All other components within normal limits  CBC WITH DIFFERENTIAL  TROPONIN I  D-DIMER, QUANTITATIVE    Imaging Review Dg Abd Acute W/chest  03/30/2014   CLINICAL DATA:  Left-sided flank pain  EXAM: ACUTE ABDOMEN SERIES (ABDOMEN 2 VIEW & CHEST 1 VIEW)  COMPARISON:  None.  FINDINGS: Cardiac shadow is within normal limits. The lungs are clear bilaterally scattered large and small bowel gas is noted without obstructive change. Postsurgical changes are seen. No free air is noted. No abnormal mass or abnormal calcifications are seen.  IMPRESSION: Nonspecific chest and abdomen.   Electronically Signed   By: Inez Catalina M.D.   On: 03/30/2014 13:20     EKG  Interpretation   Date/Time:  Monday March 30 2014 12:57:42 EDT Ventricular Rate:  85 PR Interval:  134 QRS Duration: 86 QT Interval:  360 QTC Calculation: 428 R Axis:   59 Text Interpretation:  Normal sinus rhythm Normal ECG Confirmed by HORTON   MD, COURTNEY ([id number]) on 03/30/2014 2:26:21 PM      MDM   Final diagnoses:  Chest wall pain    Patient presents with left flank and back pain.  TTP is over the left chest wall and back.  No overlying skin changes suggestive of zoster.  Patient was given pain medication.  Basic lab work obtained.  Troponin and d-dimer are negative.  EKG is reassuring.  Chest x-ray shows evidence of infiltrate or pneumothorax.  Suspect musculoskeletal etiology given reproducibility of pain.  Patient will be discharged home with a short course of pain medication.  After history, exam, and medical workup I feel the patient has been appropriately medically screened and is safe for discharge home. Pertinent diagnoses were discussed with the patient. Patient was given return precautions.  I personally performed the services described in this documentation, which was scribed in my presence. The recorded information has been reviewed and is accurate.    Merryl Hacker, MD 03/31/14 [phone]

## 2014-03-30 NOTE — ED Notes (Signed)
Patient left flank/mid-lower back pain that started yesterday and is progressively getting worse. Per patient hurts to take a deep breath. Patient states "I can hardly move."  Denies any nausea, vomiting, diarrhea, or urinary symptoms.

## 2014-03-30 NOTE — ED Notes (Signed)
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  Pt states he washed his car and did house cleaning yesterday, but does not remember any specific moment when he may have injured his back.  Patient has a constant amount of pain (4 out of 10), but is now 10 out of 10.  _Pt states he is supposed to see "Dr. Peter Garter" for future pain management, but has not been able to get an appointment yet.

## 2014-03-31 ENCOUNTER — Emergency Department (HOSPITAL_COMMUNITY)
Admission: EM | Admit: 2014-03-31 | Discharge: 2014-03-31 | Disposition: A | Discharge status: Home / Self Care | Payer: Medicaid Other | Attending: Emergency Medicine | Admitting: Emergency Medicine

## 2014-03-31 ENCOUNTER — Emergency Department (HOSPITAL_COMMUNITY): Payer: Medicaid Other

## 2014-03-31 ENCOUNTER — Encounter (HOSPITAL_COMMUNITY): Payer: Self-pay | Admitting: Emergency Medicine

## 2014-03-31 DIAGNOSIS — M545 Low back pain, unspecified: Secondary | ICD-10-CM | POA: Diagnosis not present

## 2014-03-31 DIAGNOSIS — I10 Essential (primary) hypertension: Secondary | ICD-10-CM | POA: Diagnosis not present

## 2014-03-31 DIAGNOSIS — M549 Dorsalgia, unspecified: Secondary | ICD-10-CM | POA: Diagnosis present

## 2014-03-31 DIAGNOSIS — R52 Pain, unspecified: Secondary | ICD-10-CM | POA: Diagnosis not present

## 2014-03-31 DIAGNOSIS — Z79899 Other long term (current) drug therapy: Secondary | ICD-10-CM | POA: Diagnosis not present

## 2014-03-31 DIAGNOSIS — Z8679 Personal history of other diseases of the circulatory system: Secondary | ICD-10-CM | POA: Diagnosis not present

## 2014-03-31 DIAGNOSIS — K911 Postgastric surgery syndromes: Secondary | ICD-10-CM | POA: Insufficient documentation

## 2014-03-31 DIAGNOSIS — Z7982 Long term (current) use of aspirin: Secondary | ICD-10-CM | POA: Insufficient documentation

## 2014-03-31 DIAGNOSIS — M129 Arthropathy, unspecified: Secondary | ICD-10-CM | POA: Insufficient documentation

## 2014-03-31 DIAGNOSIS — Z8673 Personal history of transient ischemic attack (TIA), and cerebral infarction without residual deficits: Secondary | ICD-10-CM | POA: Diagnosis not present

## 2014-03-31 DIAGNOSIS — F17200 Nicotine dependence, unspecified, uncomplicated: Secondary | ICD-10-CM | POA: Insufficient documentation

## 2014-03-31 DIAGNOSIS — K219 Gastro-esophageal reflux disease without esophagitis: Secondary | ICD-10-CM | POA: Diagnosis not present

## 2014-03-31 DIAGNOSIS — G8929 Other chronic pain: Secondary | ICD-10-CM | POA: Insufficient documentation

## 2014-03-31 LAB — COMPREHENSIVE METABOLIC PANEL
ALT: 21 U/L (ref 0–53)
ANION GAP: 12 (ref 5–15)
AST: 21 U/L (ref 0–37)
Albumin: 3.6 g/dL (ref 3.5–5.2)
Alkaline Phosphatase: 88 U/L (ref 39–117)
BUN: 13 mg/dL (ref 6–23)
CO2: 26 mEq/L (ref 19–32)
Calcium: 8.9 mg/dL (ref 8.4–10.5)
Chloride: 97 mEq/L (ref 96–112)
Creatinine, Ser: 1.1 mg/dL (ref 0.50–1.35)
GFR calc Af Amer: 82 mL/min — ABNORMAL LOW (ref >90)
GFR calc non Af Amer: 71 mL/min — ABNORMAL LOW (ref >90)
Glucose, Bld: 107 mg/dL — ABNORMAL HIGH (ref 70–99)
POTASSIUM: 4.2 meq/L (ref 3.7–5.3)
SODIUM: 135 meq/L — AB (ref 137–147)
TOTAL PROTEIN: 7.1 g/dL (ref 6.0–8.3)
Total Bilirubin: 0.9 mg/dL (ref 0.3–1.2)

## 2014-03-31 LAB — CBC
HCT: 46.2 % (ref 39.0–52.0)
Hemoglobin: 15.5 g/dL (ref 13.0–17.0)
MCH: 29.3 pg (ref 26.0–34.0)
MCHC: 33.5 g/dL (ref 30.0–36.0)
MCV: 87.3 fL (ref 78.0–100.0)
PLATELETS: 177 10*3/uL (ref 150–400)
RBC: 5.29 MIL/uL (ref 4.22–5.81)
RDW: 14.3 % (ref 11.5–15.5)
WBC: 10.2 10*3/uL (ref 4.0–10.5)

## 2014-03-31 LAB — LIPASE, BLOOD: Lipase: 82 U/L — ABNORMAL HIGH (ref 11–59)

## 2014-03-31 MED ORDER — CYCLOBENZAPRINE HCL 10 MG PO TABS: 10.0000 mg | ORAL_TABLET | Freq: Two times a day (BID) | ORAL | Status: DC | PRN

## 2014-03-31 MED ORDER — HYDROMORPHONE HCL PF 1 MG/ML IJ SOLN
1.0000 mg | Freq: Once | INTRAMUSCULAR | Status: AC
  Administered 2014-03-31: 1 mg via INTRAVENOUS
  Filled 2014-03-31: qty 1

## 2014-03-31 NOTE — ED Notes (Signed)
Pain lt post thorax, seen here yesterday for same. Says no better , no cough or fever.    No injury.

## 2014-03-31 NOTE — Discharge Instructions (Signed)
Back Pain, Adult °Low back pain is very common. About 1 in 5 people have back pain. The cause of low back pain is rarely dangerous. The pain often gets better over time. About half of people with a sudden onset of back pain feel better in just 2 weeks. About 8 in 10 people feel better by 6 weeks.  °CAUSES °Some common causes of back pain include: °· Strain of the muscles or ligaments supporting the spine. °· Wear and tear (degeneration) of the spinal discs. °· Arthritis. °· Direct injury to the back. °DIAGNOSIS °Most of the time, the direct cause of low back pain is not known. However, back pain can be treated effectively even when the exact cause of the pain is unknown. Answering your caregiver's questions about your overall health and symptoms is one of the most accurate ways to make sure the cause of your pain is not dangerous. If your caregiver needs more information, he or she may order lab work or imaging tests (X-rays or MRIs). However, even if imaging tests show changes in your back, this usually does not require surgery. °HOME CARE INSTRUCTIONS °For many people, back pain returns. Since low back pain is rarely dangerous, it is often a condition that people can learn to manage on their own.  °· Remain active. It is stressful on the back to sit or stand in one place. Do not sit, drive, or stand in one place for more than 30 minutes at a time. Take short walks on level surfaces as soon as pain allows. Try to increase the length of time you walk each day. °· Do not stay in bed. Resting more than 1 or 2 days can delay your recovery. °· Do not avoid exercise or work. Your body is made to move. It is not dangerous to be active, even though your back may hurt. Your back will likely heal faster if you return to being active before your pain is gone. °· Pay attention to your body when you  bend and lift. Many people have less discomfort when lifting if they bend their knees, keep the load close to their bodies, and  avoid twisting. Often, the most comfortable positions are those that put less stress on your recovering back. °· Find a comfortable position to sleep. Use a firm mattress and lie on your side with your knees slightly bent. If you lie on your back, put a pillow under your knees. °· Only take over-the-counter or prescription medicines as directed by your caregiver. Over-the-counter medicines to reduce pain and inflammation are often the most helpful. Your caregiver may prescribe muscle relaxant drugs. These medicines help dull your pain so you can more quickly return to your normal activities and healthy exercise. °· Put ice on the injured area. °¨ Put ice in a plastic bag. °¨ Place a towel between your skin and the bag. °¨ Leave the ice on for 15-20 minutes, 03-04 times a day for the first 2 to 3 days. After that, ice and heat may be alternated to reduce pain and spasms. °· Ask your caregiver about trying back exercises and gentle massage. This may be of some benefit. °· Avoid feeling anxious or stressed. Stress increases muscle tension and can worsen back pain. It is important to recognize when you are anxious or stressed and learn ways to manage it. Exercise is a great option. °SEEK MEDICAL CARE IF: °· You have pain that is not relieved with rest or medicine. °· You have pain that does not improve in 1 week. °· You have new symptoms. °· You are generally not feeling well. °SEEK   IMMEDIATE MEDICAL CARE IF:  °· You have pain that radiates from your back into your legs. °· You develop new bowel or bladder control problems. °· You have unusual weakness or numbness in your arms or legs. °· You develop nausea or vomiting. °· You develop abdominal pain. °· You feel faint. °Document Released: 08/14/2005 Document Revised: 02/13/2012 Document Reviewed: 12/16/2013 °[employer]® Patient Information ©2015 [employer]. This information is not intended to replace advice given to you by your health care provider. Make sure you  discuss any questions you have with your health care provider. ° °Back Exercises °Back exercises help treat and prevent back injuries. The goal of back exercises is to increase the strength of your abdominal and back muscles and the flexibility of your back. These exercises should be started when you no longer have back pain. Back exercises include: °· Pelvic Tilt. Lie on your back with your knees bent. Tilt your pelvis until the lower part of your back is against the floor. Hold this position 5 to 10 sec and repeat 5 to 10 times. °· Knee to Chest. Pull first 1 knee up against your chest and hold for 20 to 30 seconds, repeat this with the other knee, and then both knees. This may be done with the other leg straight or bent, whichever feels better. °· Sit-Ups or Curl-Ups. Bend your knees 90 degrees. Start with tilting your pelvis, and do a partial, slow sit-up, lifting your trunk only 30 to 45 degrees off the floor. Take at least 2 to 3 seconds for each sit-up. Do not do sit-ups with your knees out straight. If partial sit-ups are difficult, simply do the above but with only tightening your abdominal muscles and holding it as directed. °· Hip-Lift. Lie on your back with your knees flexed 90 degrees. Push down with your feet and shoulders as you raise your hips a couple inches off the floor; hold for 10 seconds, repeat 5 to 10 times. °· Back arches. Lie on your stomach, propping yourself up on bent elbows. Slowly press on your hands, causing an arch in your low back. Repeat 3 to 5 times. Any initial stiffness and discomfort should lessen with repetition over time. °· Shoulder-Lifts. Lie face down with arms beside your body. Keep hips and torso pressed to floor as you slowly lift your head and shoulders off the floor. °Do not overdo your exercises, especially in the beginning. Exercises may cause you some mild back discomfort which lasts for a few minutes; however, if the pain is more severe, or lasts for more than 15  minutes, do not continue exercises until you see your caregiver. Improvement with exercise therapy for back problems is slow.  °See your caregivers for assistance with developing a proper back exercise program. °Document Released: 09/21/2004 Document Revised: 11/06/2011 Document Reviewed: 06/15/2011 °[employer]® Patient Information ©2015 [employer]. This information is not intended to replace advice given to you by your health care provider. Make sure you discuss any questions you have with your health care provider. ° °

## 2014-03-31 NOTE — ED Notes (Signed)
Lab tech at bedside at this time.  

## 2014-03-31 NOTE — ED Notes (Signed)
PT c/o middle left sided back pain since Sunday. PT denies any urinary symptoms and was seen in ER yesterday for same compliant. PT stated he took an oxycodone at 0430 and 1030 this am with no relief.

## 2014-03-31 NOTE — ED Provider Notes (Signed)
CSN: [id number]     Arrival date & time 03/31/14  1145 History  This chart was scribed for Ephraim Hamburger, MD by Irene Pap, ED Scribe. This patient was seen in room [facility]03/[facility]03 and patient care was started at 1:08 PM.   No chief complaint on file.  The history is provided by the patient. No language interpreter was used.   HPI Comments: Gary Vasquez is a 63 y.o. male who presents to the Emergency Department complaining of constant left posterior lower back pain onset 3 days ago. Patient reports that he was seen yesterday for the same pain but it has since worsened. He reports no associated SOB or abdominal pain. He states that he was given morphine, tramadol and dilaudid in the ED yesterday to no relief. Patient denies fever, weakness, numbness, bladder/bowel incontinence, dysuria or hematuria. 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 He denies any spinal cord problems. Rates the pain as severe.   Past Medical History  Diagnosis Date  . Crohn disease   . Acid reflux   . Essential hypertension, benign   . Alcoholic hepatitis   . Arthritis   . Dumping syndrome   . Paraesophageal hernia   . Chronic diarrhea   . Subdural hematoma   . Gastroparesis   . Migraine headache   . Chronic back pain   . TIA (transient ischemic attack)   . Bulging discs    Past Surgical History  Procedure Laterality Date  . Nissen fundoplication    . Colon resection Right   . Pyloroplasty  2009  . Colonoscopy N/A 07/02/2013    Procedure: COLONOSCOPY;  Surgeon: Rogene Houston, MD;  Location: [facility];  Service: Endoscopy;  Laterality: N/A;  200  . Esophagogastroduodenoscopy N/A 08/14/2013    Procedure: ESOPHAGOGASTRODUODENOSCOPY (EGD);  Surgeon: Rogene Houston, MD;  Location: [facility];  Service: Endoscopy;  Laterality: N/A;  300   Family History  Problem  Relation Age of Onset  . Healthy Daughter   . Healthy Son   . Heart attack Mother     CABG  . Heart attack Father     CABG  . Heart attack Brother     CABG   History  Substance Use Topics  . Smoking status: Current Every Day Smoker -- 0.25 packs/day for 20 years    Types: Cigarettes  . Smokeless tobacco: Never Used     Comment: 2-3 cigarrettes a day  . Alcohol Use: 0.0 oz/week    3-4 drink(s) per week    Review of Systems  Constitutional: Negative for fever.  Respiratory: Negative for shortness of breath.   Gastrointestinal: Negative for nausea, vomiting and abdominal pain.  Genitourinary: Negative for dysuria and hematuria.  Musculoskeletal: Positive for back pain.  Neurological: Negative for weakness and numbness.  All other systems reviewed and are negative.  Allergies  Acetaminophen  Home Medications   Prior to Admission medications   Medication Sig Start Date End Date Taking? Authorizing Provider  aspirin EC 325 MG EC tablet Take 1 tablet (325 mg total) by mouth daily. 01/24/14  Yes Adeline Saralyn Pilar, MD  ibuprofen (ADVIL,MOTRIN) 200 MG tablet Take 600 mg by mouth every 6 (six) hours as needed for moderate pain.   Yes Historical Provider, MD  loratadine (CLARITIN) 10 MG tablet Take 10 mg  by mouth daily as needed for allergies.    Yes Historical Provider, MD  LORazepam (ATIVAN) 1 MG tablet Take 1 mg by mouth at bedtime.   Yes Historical Provider, MD  Multiple Vitamin (MULTIVITAMIN WITH MINERALS) TABS tablet Take 1 tablet by mouth daily.   Yes Historical Provider, MD  omeprazole (PRILOSEC) 40 MG capsule Take 1 capsule (40 mg total) by mouth 2 (two) times daily. 01/24/14  Yes Adeline Saralyn Pilar, MD  oxyCODONE-acetaminophen (PERCOCET/ROXICET) 5-325 MG per tablet Take 1-2 tablets by mouth every 6 (six) hours as needed for moderate pain or severe pain. 03/30/14  Yes Merryl Hacker, MD  ranitidine (ZANTAC) 150 MG tablet Take 1 tablet (150 mg total) by mouth at bedtime. 01/24/14   Yes Adeline C Viyuoh, MD  temazepam (RESTORIL) 7.5 MG capsule Take 1 capsule (7.5 mg total) by mouth at bedtime as needed for sleep. 03/09/14  Yes Terri L Setzer, NP   BP 140/80  Pulse 94  Temp(Src) 98.5 F (36.9 C) (Oral)  Resp 20  Ht 6\' 1"  (1.854 m)  Wt 180 lb (81.647 kg)  BMI 23.75 kg/m2  SpO2 96%  Physical Exam  Nursing note and vitals reviewed. Constitutional: He is oriented to person, place, and time. He appears well-developed and well-nourished. No distress.  HENT:  Head: Normocephalic and atraumatic.  Right Ear: External ear normal.  Left Ear: External ear normal.  Nose: Nose normal.  Eyes: Right eye exhibits no discharge. Left eye exhibits no discharge.  Neck: Neck supple.  Cardiovascular: Normal rate, regular rhythm, normal heart sounds and intact distal pulses.   Pulmonary/Chest: Effort normal.  Abdominal: Soft. There is no tenderness.  Musculoskeletal:  Lower extremity strength normal bilaterally. Patellar and achilles reflexes normal. Mild lower thoracic midline tenderness and over left cva tenderness  Neurological: He is alert and oriented to person, place, and time.  Normal sensation  Skin: Skin is warm and dry.  Psychiatric: He has a normal mood and affect. His behavior is normal.    ED Course  Procedures (including critical care time) DIAGNOSTIC STUDIES: Oxygen Saturation is 96% on room air, normal by my interpretation.    COORDINATION OF CARE: 1:12 PM-Discussed treatment plan which includes pain medications, muscle relaxers, and CT scan with pt at bedside and pt agreed to plan.   Labs Review Labs Reviewed  COMPREHENSIVE METABOLIC PANEL - Abnormal; Notable for the following:    Sodium 135 (*)    Glucose, Bld 107 (*)    GFR calc non Af Amer 71 (*)    GFR calc Af Amer 82 (*)    All other components within normal limits  LIPASE, BLOOD - Abnormal; Notable for the following:    Lipase 82 (*)    All other components within normal limits  CBC     Imaging Review Dg Abd Acute W/chest  03/30/2014   CLINICAL DATA:  Left-sided flank pain  EXAM: ACUTE ABDOMEN SERIES (ABDOMEN 2 VIEW & CHEST 1 VIEW)  COMPARISON:  None.  FINDINGS: Cardiac shadow is within normal limits. The lungs are clear bilaterally scattered large and small bowel gas is noted without obstructive change. Postsurgical changes are seen. No free air is noted. No abnormal mass or abnormal calcifications are seen.  IMPRESSION: Nonspecific chest and abdomen.   Electronically Signed   By: Inez Catalina M.D.   On: 03/30/2014 13:20   CT abd/pelvis wo contrast IMPRESSION: 1. Inflammatory changes pre P centered about the pancreas. Recommend correlation with pancreatic enzymes to exclude  pancreatitis. 2. Granulomatous changes of the liver and spleen are compatible with prior granulomatous disease. 3. Mild fatty infiltration of the liver. 4. Asymmetric left lower lobe atelectasis corresponds with patient's symptoms. 5. No acute osseous abnormality. 6. Focal degenerative changes of the lumbar spine at L4-5.   Electronically Signed By: Lawrence Santiago M.D. On: 03/31/2014 15:02    EKG Interpretation None      MDM   Final diagnoses:  Acute back pain    Patient's pain is likely MSK. Given location a CT w/o contrast was obtained to r/o stone, AAA or fracture. Shows some inflammatory changes around pancreas. Has prior pancreatitis in past but relates he's not having any of those symptoms currently (no abd pain, vomiting, etc). Labs show minimal elevation of lipase, not c/w acute pancreatitis. Patient's pain has improved, and he is stable for discharge. Has f/u with new PCP, Dr. Legrand Rams, tomorrow.   I personally performed the services described in this documentation, which was scribed in my presence. The recorded information has been reviewed and is accurate.    Ephraim Hamburger, MD 03/31/14 [phone]

## 2014-04-13 ENCOUNTER — Ambulatory Visit (INDEPENDENT_AMBULATORY_CARE_PROVIDER_SITE_OTHER): Payer: Self-pay | Admitting: Internal Medicine

## 2014-04-21 ENCOUNTER — Ambulatory Visit (INDEPENDENT_AMBULATORY_CARE_PROVIDER_SITE_OTHER): Payer: Medicaid Other | Admitting: Internal Medicine

## 2014-04-21 ENCOUNTER — Encounter (INDEPENDENT_AMBULATORY_CARE_PROVIDER_SITE_OTHER): Payer: Self-pay | Admitting: Internal Medicine

## 2014-04-21 VITALS — BP 124/76 | HR 74 | Temp 97.8°F | Resp 18 | Ht 72.0 in | Wt 180.1 lb

## 2014-04-21 DIAGNOSIS — R112 Nausea with vomiting, unspecified: Secondary | ICD-10-CM

## 2014-04-21 DIAGNOSIS — G4700 Insomnia, unspecified: Secondary | ICD-10-CM

## 2014-04-21 DIAGNOSIS — K219 Gastro-esophageal reflux disease without esophagitis: Secondary | ICD-10-CM

## 2014-04-21 DIAGNOSIS — K529 Noninfective gastroenteritis and colitis, unspecified: Secondary | ICD-10-CM

## 2014-04-21 DIAGNOSIS — R197 Diarrhea, unspecified: Secondary | ICD-10-CM

## 2014-04-21 MED ORDER — LORAZEPAM 1 MG PO TABS: 1.0000 mg | ORAL_TABLET | Freq: Every day | ORAL | Status: DC

## 2014-04-21 MED ORDER — OMEPRAZOLE 40 MG PO CPDR: 40.0000 mg | DELAYED_RELEASE_CAPSULE | Freq: Every day | ORAL | Status: DC

## 2014-04-21 NOTE — Patient Instructions (Addendum)
Call if omeprazole once a day along with ranitidine at bedtime does not control  Heartburn. Do not take more than 400 mg of ibuprofen at a time and no more than 1200 mg on any given day. Try Imodium OTC 1 mg by mouth daily for diarrhea.

## 2014-04-21 NOTE — Progress Notes (Signed)
Presenting complaint;  Followup for nausea vomiting abdominal pain diarrhea and weight loss.  Subjective:  Gary Vasquez is a 63 year old Caucasian male who presents for scheduled visit. He was last seen 2 months ago. He remains with multiple complaints but he states overall he feels better. He has gained 9 pounds in the last 4 months he has gained 25 pounds in 2-1/2 years. He continues to experience vomiting every day. He vomits at least 2-3 times after each meal. If he vomits within few minutes it is food and bile and a few vomits an hour later it is bile. He continues to complain of epigastric pain. He is still having 6-8 stools per day. He takes Imodium occasionally. If he takes it everyday he becomes constipated. He reassures me that he does not take ibuprofen but he often and it is primarily for body ache and back pain. He is on gabapentin for neuropathy. He is scheduled to be seen by Dr. Merlene Laughter next week pain management. He is drinking 2 cans of beer daily. He wants lorazepam and temazepam felt.       Current Medications: Outpatient Encounter Prescriptions as of 04/21/2014  Medication Sig  . gabapentin (NEURONTIN) 100 MG capsule Take 100 mg by mouth 3 (three) times daily.  Marland Kitchen ibuprofen (ADVIL,MOTRIN) 200 MG tablet Take 600 mg by mouth every 6 (six) hours as needed for moderate pain.  Marland Kitchen loratadine (CLARITIN) 10 MG tablet Take 10 mg by mouth daily as needed for allergies.   Marland Kitchen LORazepam (ATIVAN) 1 MG tablet Take 1 mg by mouth at bedtime.  . Multiple Vitamin (MULTIVITAMIN WITH MINERALS) TABS tablet Take 1 tablet by mouth daily.  Marland Kitchen omeprazole (PRILOSEC) 40 MG capsule Take 1 capsule (40 mg total) by mouth 2 (two) times daily.  . ranitidine (ZANTAC) 150 MG tablet Take 1 tablet (150 mg total) by mouth at bedtime.  . temazepam (RESTORIL) 7.5 MG capsule Take 1 capsule (7.5 mg total) by mouth at bedtime as needed for sleep.  . traMADol (ULTRAM) 50 MG tablet Take 50 mg by mouth 3 (three) times daily as  needed.  . [DISCONTINUED] aspirin EC 325 MG EC tablet Take 1 tablet (325 mg total) by mouth daily.  . [DISCONTINUED] cyclobenzaprine (FLEXERIL) 10 MG tablet Take 1 tablet (10 mg total) by mouth 2 (two) times daily as needed for muscle spasms.  . [DISCONTINUED] oxyCODONE-acetaminophen (PERCOCET/ROXICET) 5-325 MG per tablet Take 1-2 tablets by mouth every 6 (six) hours as needed for moderate pain or severe pain.     Objective: Blood pressure 124/76, pulse 74, temperature 97.8 F (36.6 C), temperature source Oral, resp. rate 18, height 6' (1.829 m), weight 180 lb 1.6 oz (81.693 kg). Patient is alert and in no acute distress. Conjunctiva is pink. Sclera is nonicteric Oropharyngeal mucosa is normal. No neck masses or thyromegaly noted. Cardiac exam with regular rhythm normal S1 and S2. No murmur or gallop noted. Lungs are clear to auscultation. Abdomen is full. Bowel sounds are hyperactive. On palpation it's soft abdomen with midepigastric tenderness. No organomegaly or masses. No LE edema or clubbing noted.     Assessment:  #1. Nausea and vomiting. Chronic symptomatology secondary to bile reflux. He must be keeping most of the food down Since he is gaining weight. #2. Chronic epigastric pain. IgG negative for peptic ulcer disease. #3. Insomnia. Lorazepam is helping some. He is also on low dose temazepam. He really needs to take one or the other medication. He has an appointment to see Dr. Merlene Laughter  for chronic back pain. He could also get his help regarding management of insomnia #4. Chronic diarrhea. He has dumping syndrome. Crohn's disease remains in remission.   Plan:  Patient advised to eat 6-8 small meals daily. Decrease omeprazole 40 mg by mouth every morning. Patient instructed to keep ibuprofen use the minimum and he should not take more than 400 mg at bedtime and no more than 3 times a day. New prescription given for the rest exam 1 mg by mouth.  each bedtime when necessary 30  with 2 refills. Temazepam was not filled. Office visit in 6 months.

## 2014-08-05 ENCOUNTER — Encounter (INDEPENDENT_AMBULATORY_CARE_PROVIDER_SITE_OTHER): Payer: Self-pay | Admitting: *Deleted

## 2014-08-13 ENCOUNTER — Ambulatory Visit (INDEPENDENT_AMBULATORY_CARE_PROVIDER_SITE_OTHER): Payer: Medicaid Other | Admitting: Internal Medicine

## 2014-08-13 ENCOUNTER — Encounter (INDEPENDENT_AMBULATORY_CARE_PROVIDER_SITE_OTHER): Payer: Self-pay | Admitting: Internal Medicine

## 2014-08-13 VITALS — BP 160/80 | HR 100 | Temp 97.5°F | Ht 72.0 in | Wt 183.6 lb

## 2014-08-13 DIAGNOSIS — K5090 Crohn's disease, unspecified, without complications: Secondary | ICD-10-CM

## 2014-08-13 DIAGNOSIS — G4700 Insomnia, unspecified: Secondary | ICD-10-CM

## 2014-08-13 MED ORDER — LORAZEPAM 1 MG PO TABS: 1.0000 mg | ORAL_TABLET | Freq: Every day | ORAL | Status: DC

## 2014-08-13 NOTE — Progress Notes (Addendum)
Subjective:    Patient ID: Gary Vasquez, male    DOB: 1951/08/10, 63 y.o.   MRN: [id number]  HPI Presents today c/o having a rough time. He says he feels bloated. He says he has abdominal pain across his abdomen which he says is chronic. He says none of his symptoms have changed. There has been no fever. 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 He does have vomiting. He says the vomitus is bile. Occurs after eating. Occurs on a daily basis. Symptoms for years. Hx of Crohn's disease. Diagnosed with Crohn's age 6. He says he had a resection in [facility]. He does not take medication for his Crohn's. His last flare of Crohn's was about a year ago.  He says he thinks he may be having a Crohn's flare. His nausea has increased.   Appetite is good. He has gained about 5 pounds since his last visit. He is eating about 3 meals a day, though he knows he should be eating 6 small meals a day.  He usually has a BM 5-6 a day. Most of his stools are loose. No BRRB or melena. No mucous.  Still drinking 3-5 beers a day. Hx of liver failure about 3 yrs at [place]. He had been drinking and hit his head. He has not had any Ativan in a couple of months.  He see Dr.Doonquah every 2 months for pain management. He has moved to [facility] to take care of his parents. Last PCP was Dr. Legrand Rams.  CBC    Component Value Date/Time   WBC 10.2 03/31/2014 1528   RBC 5.29 03/31/2014 1528   HGB 15.5 03/31/2014 1528   HCT 46.2 03/31/2014 1528   PLT 177 03/31/2014 1528   MCV 87.3 03/31/2014 1528   MCH 29.3 03/31/2014 1528   MCHC 33.5 03/31/2014 1528   RDW 14.3 03/31/2014 1528   LYMPHSABS 3.1 03/30/2014 1203   MONOABS 0.6 03/30/2014 1203   EOSABS 0.1 03/30/2014 1203   BASOSABS 0.1 03/30/2014 1203    Hepatic Function Panel     Component Value Date/Time   PROT 7.1 03/31/2014 1528   ALBUMIN 3.6 03/31/2014 1528   AST 21 03/31/2014 1528   ALT 21 03/31/2014 1528   ALKPHOS 88 03/31/2014 1528   BILITOT 0.9  03/31/2014 1528   BILIDIR 2.5* 10/02/2011 1618   IBILI 2.8* 10/02/2011 1618    Lipase     Component Value Date/Time   LIPASE 82* 03/31/2014 1528   CMP     Component Value Date/Time   NA 135* 03/31/2014 1528   K 4.2 03/31/2014 1528   CL 97 03/31/2014 1528   CO2 26 03/31/2014 1528   GLUCOSE 107* 03/31/2014 1528   BUN 13 03/31/2014 1528   CREATININE 1.10 03/31/2014 1528   CREATININE 1.25 06/16/2013 1232   CALCIUM 8.9 03/31/2014 1528   PROT 7.1 03/31/2014 1528   ALBUMIN 3.6 03/31/2014 1528   AST 21 03/31/2014 1528   ALT 21 03/31/2014 1528   ALKPHOS 88 03/31/2014 1528   BILITOT 0.9 03/31/2014 1528   GFRNONAA 71* 03/31/2014 1528   GFRAA 82* 03/31/2014 1528       08/14/2013 EGD: Indications: Patient is 64 year old Caucasian male who presents with recurrent nausea vomiting epigastric pain and bloating. He has a complicated GI history. He underwent repair of large hiatal hernia and pyloroplasty in February 2009 secondary to intractable GERD complicated by leak and pyloroplasty leading to gastrojejunostomy. Impression: No evidence of erosive esophagitis. Significant amount of  bile noted in gastric cavity. Gastritis predominately at the gastrojejunostomy with patent befriend and efferent loops. Patent pyloric channel.  07/02/2013 Colonoscopy: Surveillance Impression:  Normal mucosa of terminal ileum. Wide-open ileocolonic anastomosis. Single small polyp ablated via cold biopsy from sigmoid colon. Small external hemorrhoids.      Review of Systems Past Medical History  Diagnosis Date  . Crohn disease   . Acid reflux   . Essential hypertension, benign   . Alcoholic hepatitis   . Arthritis   . Dumping syndrome   . Paraesophageal hernia   . Chronic diarrhea   . Subdural hematoma   . Gastroparesis   . Migraine headache   . Chronic back pain   . TIA (transient ischemic attack)   . Bulging  discs     Past Surgical History  Procedure Laterality Date  . Nissen fundoplication    . Colon resection Right   . Pyloroplasty  2009  . Colonoscopy N/A 07/02/2013    Procedure: COLONOSCOPY;  Surgeon: Rogene Houston, MD;  [facility];  Service: Endoscopy;  Laterality: N/A;  200  . Esophagogastroduodenoscopy N/A 08/14/2013    Procedure: ESOPHAGOGASTRODUODENOSCOPY (EGD);  Surgeon: Rogene Houston, MD;  [facility];  Service: Endoscopy;  Laterality: N/A;  300    Allergies  Allergen Reactions  . Acetaminophen Other (See Comments)    Liver issues    Current Outpatient Prescriptions on File Prior to Visit  Medication Sig Dispense Refill  . gabapentin (NEURONTIN) 100 MG capsule Take 300 mg by mouth 3 (three) times daily.     Marland Kitchen ibuprofen (ADVIL,MOTRIN) 200 MG tablet Take 600 mg by mouth every 6 (six) hours as needed for moderate pain.    Marland Kitchen loratadine (CLARITIN) 10 MG tablet Take 10 mg by mouth daily as needed for allergies.     . Multiple Vitamin (MULTIVITAMIN WITH MINERALS) TABS tablet Take 1 tablet by mouth daily.    Marland Kitchen omeprazole (PRILOSEC) 40 MG capsule Take 1 capsule (40 mg total) by mouth daily. 30 capsule 11  . ranitidine (ZANTAC) 150 MG tablet Take 1 tablet (150 mg total) by mouth at bedtime. 60 tablet 5  . temazepam (RESTORIL) 7.5 MG capsule Take 1 capsule (7.5 mg total) by mouth at bedtime as needed for sleep. 20 capsule 0  . traMADol (ULTRAM) 50 MG tablet Take 50 mg by mouth 3 (three) times daily as needed.    Marland Kitchen LORazepam (ATIVAN) 1 MG tablet Take 1 tablet (1 mg total) by mouth at bedtime. Refill every 30 days (Patient not taking: Reported on 08/13/2014) 30 tablet 2   No current facility-administered medications on file prior to visit.        Objective:   Physical Exam  Filed Vitals:   08/13/14 1350  Height: 6' (1.829 m)  Weight: 183 lb 9.6 oz (83.28 kg)   Alert and oriented. Skin warm and dry. Oral mucosa is moist.   . Sclera anicteric,  conjunctivae is pink. Thyroid not enlarged. No cervical lymphadenopathy. Lungs clear. Heart regular rate and rhythm.  Abdomen is soft. Bowel sounds are positive. No hepatomegaly. No abdominal masses felt. Slight tenderness over abdomen, and epigastric region.  No edema to lower extremities.         Assessment & Plan:  Bloating, chronic nausea from bile reflux.  Dumping syndrome. His symptoms have really not change. Doubt this is a Crohn's flare, but I am going to get a CBC, Hepatic function and CRP to be sure they are  not elevated. Will refill his Ativan 1mg  with no refills.

## 2014-08-13 NOTE — Patient Instructions (Addendum)
CBC with diff, CRP, and Hepatic function. OV 3 months. Ativan 1 mg as needed for sleep.

## 2014-08-14 LAB — HEPATIC FUNCTION PANEL
ALT: 26 U/L (ref 0–53)
AST: 28 U/L (ref 0–37)
Albumin: 4.3 g/dL (ref 3.5–5.2)
Alkaline Phosphatase: 79 U/L (ref 39–117)
BILIRUBIN DIRECT: 0.1 mg/dL (ref 0.0–0.3)
BILIRUBIN INDIRECT: 0.5 mg/dL (ref 0.2–1.2)
Total Bilirubin: 0.6 mg/dL (ref 0.2–1.2)
Total Protein: 7.6 g/dL (ref 6.0–8.3)

## 2014-08-14 LAB — CBC WITH DIFFERENTIAL/PLATELET
Basophils Absolute: 0.1 10*3/uL (ref 0.0–0.1)
Basophils Relative: 1 % (ref 0–1)
EOS PCT: 2 % (ref 0–5)
Eosinophils Absolute: 0.1 10*3/uL (ref 0.0–0.7)
HCT: 45.3 % (ref 39.0–52.0)
HEMOGLOBIN: 15.6 g/dL (ref 13.0–17.0)
LYMPHS ABS: 2.4 10*3/uL (ref 0.7–4.0)
Lymphocytes Relative: 37 % (ref 12–46)
MCH: 30.2 pg (ref 26.0–34.0)
MCHC: 34.4 g/dL (ref 30.0–36.0)
MCV: 87.6 fL (ref 78.0–100.0)
MONOS PCT: 9 % (ref 3–12)
MPV: 11.5 fL (ref 9.4–12.4)
Monocytes Absolute: 0.6 10*3/uL (ref 0.1–1.0)
Neutro Abs: 3.4 10*3/uL (ref 1.7–7.7)
Neutrophils Relative %: 51 % (ref 43–77)
Platelets: 201 10*3/uL (ref 150–400)
RBC: 5.17 MIL/uL (ref 4.22–5.81)
RDW: 14.8 % (ref 11.5–15.5)
WBC: 6.6 10*3/uL (ref 4.0–10.5)

## 2014-08-14 LAB — C-REACTIVE PROTEIN

## 2014-09-15 ENCOUNTER — Telehealth (INDEPENDENT_AMBULATORY_CARE_PROVIDER_SITE_OTHER): Payer: Self-pay | Admitting: *Deleted

## 2014-09-15 DIAGNOSIS — G4700 Insomnia, unspecified: Secondary | ICD-10-CM

## 2014-09-15 MED ORDER — LORAZEPAM 1 MG PO TABS: 1.0000 mg | ORAL_TABLET | Freq: Every day | ORAL | Status: DC

## 2014-09-15 NOTE — Telephone Encounter (Signed)
Gary Vasquez has transferred her Rx for Lorazepam 1 mg from [facility] to [facility]. They are needing a new Rx faxed to them.

## 2014-09-15 NOTE — Telephone Encounter (Signed)
Rx for Ativan refilled and given to Butch Penny to fax

## 2014-10-22 ENCOUNTER — Ambulatory Visit (INDEPENDENT_AMBULATORY_CARE_PROVIDER_SITE_OTHER): Payer: Medicaid Other | Admitting: Internal Medicine

## 2014-10-22 ENCOUNTER — Encounter (INDEPENDENT_AMBULATORY_CARE_PROVIDER_SITE_OTHER): Payer: Self-pay | Admitting: *Deleted

## 2014-10-22 ENCOUNTER — Encounter (INDEPENDENT_AMBULATORY_CARE_PROVIDER_SITE_OTHER): Payer: Self-pay | Admitting: Internal Medicine

## 2014-10-22 ENCOUNTER — Other Ambulatory Visit (INDEPENDENT_AMBULATORY_CARE_PROVIDER_SITE_OTHER): Payer: Self-pay | Admitting: *Deleted

## 2014-10-22 VITALS — BP 144/74 | HR 88 | Temp 97.7°F | Ht 73.0 in | Wt 190.7 lb

## 2014-10-22 DIAGNOSIS — R1310 Dysphagia, unspecified: Secondary | ICD-10-CM

## 2014-10-22 DIAGNOSIS — G4700 Insomnia, unspecified: Secondary | ICD-10-CM

## 2014-10-22 DIAGNOSIS — K5090 Crohn's disease, unspecified, without complications: Secondary | ICD-10-CM

## 2014-10-22 MED ORDER — LORAZEPAM 1 MG PO TABS: 1.0000 mg | ORAL_TABLET | Freq: Every day | ORAL | Status: DC

## 2014-10-22 NOTE — Patient Instructions (Signed)
EGD/ED. The risks and benefits such as perforation, bleeding, and infection were reviewed with the patient and is agreeable. 

## 2014-10-22 NOTE — Progress Notes (Signed)
Subjective:    Patient ID: Gary Vasquez, male    DOB: May 07, 1951, 64 y.o.   MRN: [id number]   HPI Here today for f/u. Last seen in December. Hx of Crohn's disease.  Underwent a colon resection in his 19s for Crohn's Diagnosed with Crohn's at age 69. He tells me he is doing okay. He usually has a BM 4-6 a day. Occasionally sees blood on  the toilet tissue.  Appetite is good. No weight loss. He has gained about 7 pounds since his last visit.  States foods are lodging in his esophagus. Started about 6 yrs. Patient would like an esophageal dilatation. Foods are lodging every day.  Acid reflux is controlled. Hx of dumping syndrome. Hx of liver failure about 4 yrs at [place]. He had been drinking and hit his head.  07/02/2013 Colonoscopy:  Prep excellent. Normal mucosa of terminal ileum. Wide-open ileocolonic anastomosis with focal erythema but no erosions or ulcers noted. 4 mm polyp ablated via cold biopsy from sigmoid colon. Normal rectal mucosa. Small hemorrhoids below the dentate line.   08/14/2013 EGD:  No evidence of erosive esophagitis. Significant amount of bile noted in gastric cavity. Gastritis predominately at the gastrojejunostomy with patent befriend and efferent loops. Patent pyloric channel.   01/24/2015 Echo: EF 70% .CBC    Component Value Date/Time   WBC 6.6 08/13/2014 1418   RBC 5.17 08/13/2014 1418   HGB 15.6 08/13/2014 1418   HCT 45.3 08/13/2014 1418   PLT 201 08/13/2014 1418   MCV 87.6 08/13/2014 1418   MCH 30.2 08/13/2014 1418   MCHC 34.4 08/13/2014 1418   RDW 14.8 08/13/2014 1418   LYMPHSABS 2.4 08/13/2014 1418   MONOABS 0.6 08/13/2014 1418   EOSABS 0.1 08/13/2014 1418   BASOSABS 0.1 08/13/2014 1418    CMP     Component Value Date/Time   NA 135* 03/31/2014 1528   K 4.2 03/31/2014 1528   CL 97 03/31/2014 1528   CO2 26 03/31/2014 1528   GLUCOSE 107* 03/31/2014 1528   BUN 13 03/31/2014 1528   CREATININE 1.10 03/31/2014 1528   CREATININE 1.25  06/16/2013 1232   CALCIUM 8.9 03/31/2014 1528   PROT 7.6 08/13/2014 1418   ALBUMIN 4.3 08/13/2014 1418   AST 28 08/13/2014 1418   ALT 26 08/13/2014 1418   ALKPHOS 79 08/13/2014 1418   BILITOT 0.6 08/13/2014 1418   GFRNONAA 71* 03/31/2014 1528   GFRAA 82* 03/31/2014 1528         Review of Systems Past Medical History  Diagnosis Date  . Crohn disease   . Acid reflux   . Essential hypertension, benign   . Alcoholic hepatitis   . Arthritis   . Dumping syndrome   . Paraesophageal hernia   . Chronic diarrhea   . Subdural hematoma   . Gastroparesis   . Migraine headache   . Chronic back pain   . TIA (transient ischemic attack)   . Bulging discs     Past Surgical History  Procedure Laterality Date  . Nissen fundoplication    . Colon resection Right   . Pyloroplasty  2009  . Colonoscopy N/A 07/02/2013    Procedure: COLONOSCOPY;  Surgeon: Rogene Houston, MD;  [facility];  Service: Endoscopy;  Laterality: N/A;  200  . Esophagogastroduodenoscopy N/A 08/14/2013    Procedure: ESOPHAGOGASTRODUODENOSCOPY (EGD);  Surgeon: Rogene Houston, MD;  [facility];  Service: Endoscopy;  Laterality: N/A;  300    Allergies  Allergen Reactions  . Acetaminophen Other (See Comments)    Liver issues    Current Outpatient Prescriptions on File Prior to Visit  Medication Sig Dispense Refill  . gabapentin (NEURONTIN) 100 MG capsule Take 300 mg by mouth 3 (three) times daily.     Marland Kitchen ibuprofen (ADVIL,MOTRIN) 200 MG tablet Take 600 mg by mouth every 6 (six) hours as needed for moderate pain.    Marland Kitchen loratadine (CLARITIN) 10 MG tablet Take 10 mg by mouth daily as needed for allergies.     . Multiple Vitamin (MULTIVITAMIN WITH MINERALS) TABS tablet Take 1 tablet by mouth daily.    Marland Kitchen omeprazole (PRILOSEC) 40 MG capsule Take 1 capsule (40 mg total) by mouth daily. 30 capsule 11  . ranitidine (ZANTAC) 150 MG tablet Take 1 tablet (150 mg total) by mouth at bedtime. 60 tablet 5  .  temazepam (RESTORIL) 7.5 MG capsule Take 1 capsule (7.5 mg total) by mouth at bedtime as needed for sleep. 20 capsule 0  . traMADol (ULTRAM) 50 MG tablet Take 50 mg by mouth 3 (three) times daily as needed.     No current facility-administered medications on file prior to visit.        Objective:   Physical Exam Filed Vitals:   10/22/14 1051  Height: 6\' 1"  (1.854 m)  Weight: 190 lb 11.2 oz (86.501 kg)   Alert and oriented. Skin warm and dry. Oral mucosa is moist.   . Sclera anicteric, conjunctivae is pink. Thyroid not enlarged. No cervical lymphadenopathy. Lungs clear. Heart regular rate and rhythm.  Abdomen is soft. Bowel sounds are positive. No hepatomegaly. No abdominal masses felt. No tenderness.  No edema to lower extremities.   Midline scar to abdomen (slight to rt of mid-line)      Assessment & Plan:  Dysphagia to solids. Stricture needs to be ruled out. EGDED Crohn's disease. Seems to be in remission. No abdominal pain. No change in his stools Rx for Ativan given to patient.

## 2014-10-29 DIAGNOSIS — R1310 Dysphagia, unspecified: Secondary | ICD-10-CM | POA: Diagnosis not present

## 2014-10-29 DIAGNOSIS — K2900 Acute gastritis without bleeding: Secondary | ICD-10-CM | POA: Diagnosis not present

## 2014-10-29 DIAGNOSIS — K222 Esophageal obstruction: Secondary | ICD-10-CM | POA: Diagnosis not present

## 2014-10-29 DIAGNOSIS — K210 Gastro-esophageal reflux disease with esophagitis: Secondary | ICD-10-CM | POA: Diagnosis not present

## 2014-10-30 ENCOUNTER — Encounter (HOSPITAL_COMMUNITY)
Admission: RE | Disposition: A | Discharge status: Home / Self Care | Payer: Self-pay | Source: Ambulatory Visit | Attending: Internal Medicine

## 2014-10-30 ENCOUNTER — Encounter (HOSPITAL_COMMUNITY): Payer: Self-pay | Admitting: *Deleted

## 2014-10-30 ENCOUNTER — Ambulatory Visit (HOSPITAL_COMMUNITY)
Admission: RE | Admit: 2014-10-30 | Discharge: 2014-10-30 | Disposition: A | Discharge status: Home / Self Care | Payer: Medicaid Other | Source: Ambulatory Visit | Attending: Internal Medicine | Admitting: Internal Medicine

## 2014-10-30 DIAGNOSIS — G43909 Migraine, unspecified, not intractable, without status migrainosus: Secondary | ICD-10-CM | POA: Insufficient documentation

## 2014-10-30 DIAGNOSIS — F17210 Nicotine dependence, cigarettes, uncomplicated: Secondary | ICD-10-CM | POA: Diagnosis not present

## 2014-10-30 DIAGNOSIS — I10 Essential (primary) hypertension: Secondary | ICD-10-CM | POA: Diagnosis not present

## 2014-10-30 DIAGNOSIS — K219 Gastro-esophageal reflux disease without esophagitis: Secondary | ICD-10-CM | POA: Diagnosis not present

## 2014-10-30 DIAGNOSIS — Z888 Allergy status to other drugs, medicaments and biological substances status: Secondary | ICD-10-CM | POA: Diagnosis not present

## 2014-10-30 DIAGNOSIS — K2950 Unspecified chronic gastritis without bleeding: Secondary | ICD-10-CM | POA: Insufficient documentation

## 2014-10-30 DIAGNOSIS — Z8673 Personal history of transient ischemic attack (TIA), and cerebral infarction without residual deficits: Secondary | ICD-10-CM | POA: Diagnosis not present

## 2014-10-30 DIAGNOSIS — R1310 Dysphagia, unspecified: Secondary | ICD-10-CM

## 2014-10-30 DIAGNOSIS — K222 Esophageal obstruction: Secondary | ICD-10-CM | POA: Diagnosis not present

## 2014-10-30 HISTORY — PX: ESOPHAGOGASTRODUODENOSCOPY: SHX5428

## 2014-10-30 HISTORY — PX: ESOPHAGEAL DILATION: SHX303

## 2014-10-30 SURGERY — EGD (ESOPHAGOGASTRODUODENOSCOPY)
Anesthesia: Moderate Sedation

## 2014-10-30 MED ORDER — MIDAZOLAM HCL 5 MG/5ML IJ SOLN
INTRAMUSCULAR | Status: AC
  Filled 2014-10-30: qty 5

## 2014-10-30 MED ORDER — SODIUM CHLORIDE 0.9 % IV SOLN
INTRAVENOUS | Status: DC
  Administered 2014-10-30: 09:00:00 via INTRAVENOUS

## 2014-10-30 MED ORDER — MEPERIDINE HCL 50 MG/ML IJ SOLN
INTRAMUSCULAR | Status: DC | PRN
  Administered 2014-10-30 (×2): 25 mg via INTRAVENOUS

## 2014-10-30 MED ORDER — MEPERIDINE HCL 50 MG/ML IJ SOLN
INTRAMUSCULAR | Status: AC
  Filled 2014-10-30: qty 1

## 2014-10-30 MED ORDER — STERILE WATER FOR IRRIGATION IR SOLN
Status: DC | PRN
  Administered 2014-10-30: 10:00:00

## 2014-10-30 MED ORDER — MIDAZOLAM HCL 5 MG/5ML IJ SOLN
INTRAMUSCULAR | Status: AC
  Filled 2014-10-30: qty 10

## 2014-10-30 MED ORDER — MIDAZOLAM HCL 5 MG/5ML IJ SOLN
INTRAMUSCULAR | Status: DC | PRN
  Administered 2014-10-30: 3 mg via INTRAVENOUS
  Administered 2014-10-30: 2 mg via INTRAVENOUS
  Administered 2014-10-30: 3 mg via INTRAVENOUS
  Administered 2014-10-30 (×2): 2 mg via INTRAVENOUS

## 2014-10-30 MED ORDER — BUTAMBEN-TETRACAINE-BENZOCAINE 2-2-14 % EX AERO
INHALATION_SPRAY | CUTANEOUS | Status: DC | PRN
  Administered 2014-10-30: 2 via TOPICAL

## 2014-10-30 NOTE — Op Note (Signed)
EGD PROCEDURE REPORT  PATIENT:  Gary Vasquez  MR#:  [id number] Birthdate:  [birth year], 64 y.o., male Endoscopist:  Dr. Rogene Houston, MD  Procedure Date: 10/30/2014  Procedure:   EGD with ED  Indications:  Patient is 64 year old occasion male with complicated GI history including chronic GERD who presents with solid food dysphagia of several months duration.            Informed Consent:  The risks, benefits, alternatives & imponderables which include, but are not limited to, bleeding, infection, perforation, drug reaction and potential missed lesion have been reviewed.  The potential for biopsy, lesion removal, esophageal dilation, etc. have also been discussed.  Questions have been answered.  All parties agreeable.  Please see history & physical in medical record for more information.  Medications:  Demerol 50 mg IV Versed 12 mg IV Cetacaine spray topically for oropharyngeal anesthesia  Description of procedure:  The endoscope was introduced through the mouth and advanced to the second portion of the duodenum without difficulty or limitations. The mucosal surfaces were surveyed very carefully during advancement of the scope and upon withdrawal.  Findings:  Esophagus:  Mucosa of esophagus was normal. Soft stricture noted at GE junction without erosions. GEJ:  42 cm Stomach:  Large gastric pouch containing moderate amount of bile. There was diffuse edema erythema and granularity 2 gastric mucosa. Gastro jejunostomy was wide open. Scope was easily retroflexed to examine fundus and cardia revealing similar changes of gastritis. Small bowel:  Efferent loop was examined for 30 cm and revealed normal mucosa and folds. Efferent loop was also examined and reveals normal mucosa.  Therapeutic/Diagnostic Maneuvers Performed:   Esophagus dilated by passing 51 French Maloney dilator to 70 cm from the incisors. As the dilator was withdrawn endoscope was passed again and linear mucosal disruption  noted at distal esophagus about 15-20 mm long. Random biopsies taken from gastric mucosa. Multiple biopsies also taken from esophageal mucosa.  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 Diffuse gastritis with duodenal gastric bile reflux. Wide open Billroth II anastomosis without ulceration. Normal mucosa of efferent and afferent loop.  Recommendations:  Standard instructions given. Patient advised to decrease alcohol intake to no more than 2 drinks per day. I will be contacting patient with biopsy results.  Kesean Serviss U  10/30/2014  10:21 AM  CC: Dr. Rosita Fire, MD & Dr. Rayne Du ref. provider found

## 2014-10-30 NOTE — H&P (Signed)
Gary Vasquez is an 64 y.o. male.   Chief Complaint: Patient is here for EGD and ED. HPI: Patient is 64 year old Caucasian male with complicated GI history including chronic GERD who presents with solid food dysphagia. He states he's had dysphagia off and on for few years but has gotten worse over the last 8 months. He has difficulty with dry foods particularly meat and bread. He has heartburn 2-3 times a week despite taking medication. He denies nausea vomiting abdominal pain melena or rectal bleeding. Last EGD was in December 2014 performed for epigastric pain nausea and vomiting.  Past Medical History  Diagnosis Date  . Crohn disease   . Acid reflux   . Essential hypertension, benign   . Alcoholic hepatitis   . Arthritis   . Dumping syndrome   . Paraesophageal hernia   . Chronic diarrhea   . Subdural hematoma   . Gastroparesis   . Migraine headache   . Chronic back pain   . TIA (transient ischemic attack)   . Bulging discs     Past Surgical History  Procedure Laterality Date  . Nissen fundoplication    . Colon resection Right   . Pyloroplasty  2009  . Colonoscopy N/A 07/02/2013    Procedure: COLONOSCOPY;  Surgeon: Rogene Houston, MD;  [facility];  Service: Endoscopy;  Laterality: N/A;  200  . Esophagogastroduodenoscopy N/A 08/14/2013    Procedure: ESOPHAGOGASTRODUODENOSCOPY (EGD);  Surgeon: Rogene Houston, MD;  [facility];  Service: Endoscopy;  Laterality: N/A;  300    Family History  Problem Relation Age of Onset  . Healthy Daughter   . Healthy Son   . Heart attack Mother     CABG  . Heart attack Father     CABG  . Heart attack Brother     CABG   Social History:  reports that he has been smoking Cigarettes.  He has a 5 pack-year smoking history. He has never used smokeless tobacco. He reports that he drinks alcohol. He reports that he does not use illicit drugs.  Allergies:  Allergies  Allergen Reactions  . Acetaminophen Other (See  Comments)    Liver issues    Medications Prior to Admission  Medication Sig Dispense Refill  . gabapentin (NEURONTIN) 100 MG capsule Take 300 mg by mouth 4 (four) times daily.     Marland Kitchen ibuprofen (ADVIL,MOTRIN) 200 MG tablet Take 600 mg by mouth every 6 (six) hours as needed for moderate pain.    Marland Kitchen loratadine (CLARITIN) 10 MG tablet Take 10 mg by mouth daily as needed for allergies.     Marland Kitchen LORazepam (ATIVAN) 1 MG tablet Take 1 tablet (1 mg total) by mouth at bedtime. 60 tablet 0  . Multiple Vitamin (MULTIVITAMIN WITH MINERALS) TABS tablet Take 1 tablet by mouth daily.    Marland Kitchen omeprazole (PRILOSEC) 40 MG capsule Take 1 capsule (40 mg total) by mouth daily. (Patient taking differently: Take 40 mg by mouth 2 (two) times daily. ) 30 capsule 11  . ranitidine (ZANTAC) 150 MG tablet Take 1 tablet (150 mg total) by mouth at bedtime. 60 tablet 5  . traMADol (ULTRAM) 50 MG tablet Take 50 mg by mouth 3 (three) times daily as needed for moderate pain.     Marland Kitchen temazepam (RESTORIL) 7.5 MG capsule Take 1 capsule (7.5 mg total) by mouth at bedtime as needed for sleep. (Patient not taking: Reported on 10/23/2014) 20 capsule 0    No results found  for this or any previous visit (from the past 48 hour(s)). No results found.  ROS  Blood pressure 145/80, pulse 89, temperature 97.8 F (36.6 C), temperature source Oral, resp. rate 15, height 6' 1"  (1.854 m), weight 190 lb (86.183 kg), SpO2 98 %. Physical Exam  Constitutional: He appears well-developed and well-nourished.  HENT:  Mouth/Throat: Oropharynx is clear and moist.  Eyes: Conjunctivae are normal. No scleral icterus.  Neck: No thyromegaly present.  Cardiovascular: Normal rate, regular rhythm and normal heart sounds.   No murmur heard. Respiratory: Effort normal and breath sounds normal.  GI: Soft. Tenderness:  mild midepigastric tenderness.  Right paramedian scar  Musculoskeletal: He exhibits no edema.  Lymphadenopathy:    He has no cervical adenopathy.   Neurological: He is alert.  Skin: Skin is warm and dry.     Assessment/Plan Solid food dysphagia. Chronic GERD. EGD and ED.  REHMAN,NAJEEB U 10/30/2014, 9:38 AM

## 2014-10-30 NOTE — Discharge Instructions (Signed)
Resume usual medications and diet. You must keep alcohol intake to no more than two drinks a day. No driving for 24 hours. Physician will call with biopsy results.   Esophagogastroduodenoscopy Care After Refer to this sheet in the next few weeks. These instructions provide you with information on caring for yourself after your procedure. Your caregiver may also give you more specific instructions. Your treatment has been planned according to current medical practices, but problems sometimes occur. Call your caregiver if you have any problems or questions after your procedure.  HOME CARE INSTRUCTIONS  Do not eat or drink anything until the numbing medicine (local anesthetic) has worn off and your gag reflex has returned. You will know that the local anesthetic has worn off when you can swallow comfortably.  Do not drive for 12 hours after the procedure or as directed by your caregiver.  Only take medicines as directed by your caregiver. SEEK MEDICAL CARE IF:   You cannot stop coughing.  You are not urinating at all or less than usual. SEEK IMMEDIATE MEDICAL CARE IF:  You have difficulty swallowing.  You cannot eat or drink.  You have worsening throat or chest pain.  You have dizziness, lightheadedness, or you faint.  You have nausea or vomiting.  You have chills.  You have a fever.  You have severe abdominal pain.  You have black, tarry, or bloody stools. Document Released: 07/31/2012 Document Reviewed: 07/31/2012 [facility] Patient Information 2015 [place]. This information is not intended to replace advice given to you by your health care provider. Make sure you discuss any questions you have with your health care provider.

## 2014-11-04 ENCOUNTER — Encounter (HOSPITAL_COMMUNITY): Payer: Self-pay | Admitting: Internal Medicine

## 2014-11-12 ENCOUNTER — Encounter (INDEPENDENT_AMBULATORY_CARE_PROVIDER_SITE_OTHER): Payer: Self-pay | Admitting: *Deleted

## 2014-11-18 ENCOUNTER — Telehealth (INDEPENDENT_AMBULATORY_CARE_PROVIDER_SITE_OTHER): Payer: Self-pay | Admitting: *Deleted

## 2014-11-18 ENCOUNTER — Encounter (INDEPENDENT_AMBULATORY_CARE_PROVIDER_SITE_OTHER): Payer: Self-pay | Admitting: *Deleted

## 2014-11-18 NOTE — Telephone Encounter (Signed)
Gary Vasquez doesn't remember speaking with Dr. Laural Golden about his Biopsy Results. Would like for Tammy to return his call at [phone].

## 2014-11-19 NOTE — Telephone Encounter (Signed)
Call was made to the number provided., 3 times. Continued to get patient's voicemail. The note noted below was left on the patient's voicemail. Patient was ask to call office to verifythat he had rec'd this message.

## 2014-11-19 NOTE — Telephone Encounter (Signed)
Patient's call returned. The following information given. Notes Recorded by Worthy Keeler on 11/12/2014 at 12:13 PM 3 mth ov noted in recall, procedure note and pathology result letter faxed to PCP  Notes Recorded by Rogene Houston, MD on 11/09/2014 at 4:37 PM Biopsy results reviewed with patient. Esophageal biopsy shows changes consistent with reflux esophagitis. Gastric Biopsy shows gastritis and no evidence of H. pylori infection. Etiology felt to be bile and alcohol Results reviewed with patient; he must quit drinking alcohol. If he cannot do it on his own he should consider rehabilitation. Office visit in 3 months.

## 2014-11-23 NOTE — Telephone Encounter (Signed)
A 3 month f/u apt was scheduled for 01/19/15 with Dr. Laural Golden.

## 2014-12-25 ENCOUNTER — Other Ambulatory Visit (INDEPENDENT_AMBULATORY_CARE_PROVIDER_SITE_OTHER): Payer: Self-pay | Admitting: Internal Medicine

## 2014-12-30 ENCOUNTER — Ambulatory Visit (INDEPENDENT_AMBULATORY_CARE_PROVIDER_SITE_OTHER): Payer: Medicaid Other | Admitting: Internal Medicine

## 2014-12-30 ENCOUNTER — Encounter (INDEPENDENT_AMBULATORY_CARE_PROVIDER_SITE_OTHER): Payer: Self-pay | Admitting: Internal Medicine

## 2014-12-30 VITALS — BP 126/80 | HR 64 | Temp 98.1°F | Ht 73.0 in | Wt 192.1 lb

## 2014-12-30 DIAGNOSIS — R194 Change in bowel habit: Secondary | ICD-10-CM

## 2014-12-30 DIAGNOSIS — K5090 Crohn's disease, unspecified, without complications: Secondary | ICD-10-CM

## 2014-12-30 DIAGNOSIS — S30861A Insect bite (nonvenomous) of abdominal wall, initial encounter: Secondary | ICD-10-CM

## 2014-12-30 DIAGNOSIS — R109 Unspecified abdominal pain: Secondary | ICD-10-CM | POA: Diagnosis not present

## 2014-12-30 DIAGNOSIS — W57XXXA Bitten or stung by nonvenomous insect and other nonvenomous arthropods, initial encounter: Secondary | ICD-10-CM

## 2014-12-30 DIAGNOSIS — K219 Gastro-esophageal reflux disease without esophagitis: Secondary | ICD-10-CM

## 2014-12-30 LAB — COMPREHENSIVE METABOLIC PANEL
ALT: 37 U/L (ref 0–53)
AST: 51 U/L — AB (ref 0–37)
Albumin: 4 g/dL (ref 3.5–5.2)
Alkaline Phosphatase: 68 U/L (ref 39–117)
BILIRUBIN TOTAL: 0.6 mg/dL (ref 0.2–1.2)
BUN: 7 mg/dL (ref 6–23)
CO2: 23 mEq/L (ref 19–32)
CREATININE: 1.1 mg/dL (ref 0.50–1.35)
Calcium: 9.1 mg/dL (ref 8.4–10.5)
Chloride: 99 mEq/L (ref 96–112)
Glucose, Bld: 114 mg/dL — ABNORMAL HIGH (ref 70–99)
Potassium: 4.4 mEq/L (ref 3.5–5.3)
Sodium: 135 mEq/L (ref 135–145)
Total Protein: 7.4 g/dL (ref 6.0–8.3)

## 2014-12-30 LAB — CBC WITH DIFFERENTIAL/PLATELET
Basophils Absolute: 0.1 10*3/uL (ref 0.0–0.1)
Basophils Relative: 1 % (ref 0–1)
EOS PCT: 1 % (ref 0–5)
Eosinophils Absolute: 0.1 10*3/uL (ref 0.0–0.7)
HEMATOCRIT: 41.1 % (ref 39.0–52.0)
Hemoglobin: 14.1 g/dL (ref 13.0–17.0)
LYMPHS PCT: 43 % (ref 12–46)
Lymphs Abs: 3.1 10*3/uL (ref 0.7–4.0)
MCH: 30.8 pg (ref 26.0–34.0)
MCHC: 34.3 g/dL (ref 30.0–36.0)
MCV: 89.7 fL (ref 78.0–100.0)
MONO ABS: 0.9 10*3/uL (ref 0.1–1.0)
MPV: 11 fL (ref 8.6–12.4)
Monocytes Relative: 12 % (ref 3–12)
Neutro Abs: 3.1 10*3/uL (ref 1.7–7.7)
Neutrophils Relative %: 43 % (ref 43–77)
Platelets: 180 10*3/uL (ref 150–400)
RBC: 4.58 MIL/uL (ref 4.22–5.81)
RDW: 14.4 % (ref 11.5–15.5)
WBC: 7.1 10*3/uL (ref 4.0–10.5)

## 2014-12-30 NOTE — Progress Notes (Signed)
Subjective:    Patient ID: Gary Vasquez, male    DOB: March 19, 1951, 64 y.o.   MRN: [id number]  HPI Presents today with c/o abdominal and back pain for the past several weeks.  He is having 5-8 stools a day. He alternates between constipation and diarrhea. He has had some night sweats. He has had a couple of tic bites. Tic bite over a month ago. Tick bites to his abdomen x 2. Two very small raw areas to rt abdomen and rt leg.   He says he has soreness across his abdomen which is chronic. Also c/o acid reflux. Last EGD in March showed diffuse gastritis with duodenal gastric bile reflux.   He has had some rectal bleeding. The bleeding is bright red.  He sees the bleeding everyday.  Hx of Crohn's disease. Underwent a colon resection in his 53s for Crohn's Diagnosed with Crohn's at age 30.  He is drinking 3-4 beers a day.  He says at times he is depressed.    Hx of dumping syndrome. Hx of liver failure about 4 yrs at [facility]. He had been drinking and hit his head.  07/02/2013 Colonoscopy:  Prep excellent. Normal mucosa of terminal ileum. Wide-open ileocolonic anastomosis with focal erythema but no erosions or ulcers noted. 4 mm polyp ablated via cold biopsy from sigmoid colon. Normal rectal mucosa. Small hemorrhoids below the dentate line.  Hyperplastic polyp    10/30/2014 EGD with ED  Indications: Patient is 64 year old occasion male with complicated GI history including chronic GERD who presents with solid food dysphagia of several months duration. 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 Diffuse gastritis with duodenal gastric bile reflux. Wide open Billroth II anastomosis without ulceration. Normal mucosa of efferent and afferent loop.   Review of Systems Past Medical History  Diagnosis Date  . Crohn disease   . Acid reflux   . Essential hypertension, benign     . Alcoholic hepatitis   . Arthritis   . Dumping syndrome   . Paraesophageal hernia   . Chronic diarrhea   . Subdural hematoma   . Gastroparesis   . Migraine headache   . Chronic back pain   . TIA (transient ischemic attack)   . Bulging discs     Past Surgical History  Procedure Laterality Date  . Nissen fundoplication    . Colon resection Right   . Pyloroplasty  2009  . Colonoscopy N/A 07/02/2013    Procedure: COLONOSCOPY;  Surgeon: Rogene Houston, MD;  [facility];  Service: Endoscopy;  Laterality: N/A;  200  . Esophagogastroduodenoscopy N/A 08/14/2013    Procedure: ESOPHAGOGASTRODUODENOSCOPY (EGD);  Surgeon: Rogene Houston, MD;  [facility];  Service: Endoscopy;  Laterality: N/A;  300  . Esophagogastroduodenoscopy N/A 10/30/2014    Procedure: ESOPHAGOGASTRODUODENOSCOPY (EGD);  Surgeon: Rogene Houston, MD;  [facility];  Service: Endoscopy;  Laterality: N/A;  940  . Esophageal dilation N/A 10/30/2014    Procedure: ESOPHAGEAL DILATION;  Surgeon: Rogene Houston, MD;  [facility];  Service: Endoscopy;  Laterality: N/A;    Allergies  Allergen Reactions  . Acetaminophen Other (See Comments)    Liver issues    Current Outpatient Prescriptions on File Prior to Visit  Medication Sig Dispense Refill  . gabapentin (NEURONTIN) 100 MG capsule Take 300 mg by mouth 4 (four) times daily.     Marland Kitchen ibuprofen (ADVIL,MOTRIN) 200 MG tablet Take 600 mg by mouth every 6 (six) hours as  needed for moderate pain.    Marland Kitchen loratadine (CLARITIN) 10 MG tablet Take 10 mg by mouth daily as needed for allergies.     Marland Kitchen LORazepam (ATIVAN) 1 MG tablet TAKE ONE TABLET TWICE DAILY 60 tablet 0  . Multiple Vitamin (MULTIVITAMIN WITH MINERALS) TABS tablet Take 1 tablet by mouth daily.    Marland Kitchen omeprazole (PRILOSEC) 40 MG capsule Take 1 capsule (40 mg total) by mouth daily. (Patient taking differently: Take 40 mg by mouth 2 (two) times daily. ) 30 capsule 11  . ranitidine  (ZANTAC) 150 MG tablet Take 1 tablet (150 mg total) by mouth at bedtime. 60 tablet 5  . traMADol (ULTRAM) 50 MG tablet Take 50 mg by mouth 3 (three) times daily as needed for moderate pain.      No current facility-administered medications on file prior to visit.        Objective:   Physical Exam Blood pressure 126/80, pulse 64, temperature 98.1 F (36.7 C), height 6' 1"  (1.854 m), weight 192 lb 1.6 oz (87.136 kg). Alert and oriented. Skin warm and dry. Oral mucosa is moist.   . Sclera anicteric, conjunctivae is pink. Thyroid not enlarged. No cervical lymphadenopathy. Lungs clear. Heart regular rate and rhythm.  Abdomen is soft. Bowel sounds are positive. No hepatomegaly. No abdominal masses felt. No tenderness.  No edema to lower extremities.          Assessment & Plan:  Abdominal pain: Also c/o rectal bleeding.  CBC with diff. Hepatic function.  Stool cards x 3 sent with patient.  Patient request pain medication today and I advised him to follow up with Dr. Legrand Rams.

## 2014-12-30 NOTE — Patient Instructions (Addendum)
CBC, hepatic function, RMSF, Stool cards x 3

## 2014-12-31 LAB — ROCKY MTN SPOTTED FVR AB, IGG-BLOOD: RMSF IGG: 0.23 IV

## 2015-01-19 ENCOUNTER — Ambulatory Visit (INDEPENDENT_AMBULATORY_CARE_PROVIDER_SITE_OTHER): Payer: Medicaid Other | Admitting: Internal Medicine

## 2015-01-19 ENCOUNTER — Encounter (INDEPENDENT_AMBULATORY_CARE_PROVIDER_SITE_OTHER): Payer: Self-pay | Admitting: Internal Medicine

## 2015-01-19 ENCOUNTER — Encounter (INDEPENDENT_AMBULATORY_CARE_PROVIDER_SITE_OTHER): Payer: Self-pay | Admitting: *Deleted

## 2015-01-19 VITALS — BP 118/76 | HR 80 | Temp 98.2°F | Resp 18 | Ht 73.0 in | Wt 189.7 lb

## 2015-01-19 DIAGNOSIS — R1114 Bilious vomiting: Secondary | ICD-10-CM

## 2015-01-19 DIAGNOSIS — R1319 Other dysphagia: Secondary | ICD-10-CM

## 2015-01-19 DIAGNOSIS — R1314 Dysphagia, pharyngoesophageal phase: Secondary | ICD-10-CM

## 2015-01-19 DIAGNOSIS — K5090 Crohn's disease, unspecified, without complications: Secondary | ICD-10-CM

## 2015-01-19 DIAGNOSIS — R1310 Dysphagia, unspecified: Secondary | ICD-10-CM

## 2015-01-19 NOTE — Progress Notes (Signed)
Presenting complaint;  Nausea vomiting and dysphagia.  Subjective:  Gary Vasquez is 64 year old Caucasian male who is here for scheduled visit. He was just seen by Ms. Setzer NP 3 weeks ago. He was complaining of rectal bleeding. His H&H was 14.1 and 41.1 and he hasn't had any more episodes of bleeding. He has multiple symptoms. He complains of multiple episodes of nausea and vomiting usually after meals. Vomitus primarily consists of [place] occasionally of food. He denies hematemesis. He continues to complain of  epigastric pain and intermittent diarrhea. He has good appetite but he is afraid to eat. He has lost 3 pounds since his last visit. He states they're very few foods like pasta spaghetti and macaroni and can eat without getting sick. Complains of dysphagia to solids. Esophagus was dilated on 10/30/2014 and he says helped him for 1 month. He has difficulty with meats and bread. He has no difficulty with liquids. He is drinking anywhere from 2-4 cans of beer daily. He states alcohol is only thing that provides some relief from his chronic symptoms. He is interested in surgical treatment if it would help. He also complains of erectile dysfunction. Patient advised to discuss this symptom and treatment with Dr. Everette Rank.   Current Medications: Outpatient Encounter Prescriptions as of 01/19/2015  Medication Sig  . gabapentin (NEURONTIN) 100 MG capsule Take 300 mg by mouth 4 (four) times daily.   Marland Kitchen ibuprofen (ADVIL,MOTRIN) 200 MG tablet Take 600 mg by mouth every 6 (six) hours as needed for moderate pain.  Marland Kitchen loperamide (IMODIUM) 2 MG capsule Take 2 mg by mouth as needed for diarrhea or loose stools.  Marland Kitchen loratadine (CLARITIN) 10 MG tablet Take 10 mg by mouth daily as needed for allergies.   Marland Kitchen LORazepam (ATIVAN) 1 MG tablet TAKE ONE TABLET TWICE DAILY  . ranitidine (ZANTAC) 150 MG tablet Take 1 tablet (150 mg total) by mouth at bedtime.  . traMADol (ULTRAM) 50 MG tablet Take 50 mg by mouth 3 (three) times  daily as needed for moderate pain.   . Multiple Vitamin (MULTIVITAMIN WITH MINERALS) TABS tablet Take 1 tablet by mouth daily.  . [DISCONTINUED] omeprazole (PRILOSEC) 40 MG capsule Take 1 capsule (40 mg total) by mouth daily. (Patient not taking: Reported on 01/19/2015)  . [DISCONTINUED] OxyCODONE (OXYCONTIN) 10 mg T12A 12 hr tablet Take 10 mg by mouth every 12 (twelve) hours.   No facility-administered encounter medications on file as of 01/19/2015.     Objective: Blood pressure 118/76, pulse 80, temperature 98.2 F (36.8 C), temperature source Oral, resp. rate 18, height 6' 1"  (1.854 m), weight 189 lb 11.2 oz (86.047 kg). Patient is alert and in no acute distress. Conjunctiva is pink. Sclera is nonicteric Oropharyngeal mucosa is normal. No neck masses or thyromegaly noted. Cardiac exam with regular rhythm normal S1 and S2. No murmur or gallop noted. Lungs are clear to auscultation. Abdomen is full. Bowel sounds are normal. On palpation abdomen is soft with mild tenderness in midepigastric region. No organomegaly or masses.  No LE edema or clubbing noted.  Labs/studies Results: Lab data from 12/30/2014 WBC 7.1, H&H 14.1 and 41.1 and platelet count 180 K BUN 7 and creatinine 1.10 Bilirubin 0.6, AP 68, AST 51, ALT 37 and albumin 4.0. Calcium 9.1    Assessment:  #1. Chronic nausea and vomiting felt to be secondary to bile reflux resulting from B2 anastomosis. He feels his symptoms are affecting quality of his life and in interested in evaluation at tertiary center. I believe  he will benefit from Roux-en-Y anastomosis but this may be difficult given history of prior surgeries and peritonitis . #2. Recurrent solid food dysphagia. Last dilation helped him for 1 month only. He possibly has developed recurrent stricture given frequent episodes of regurgitation and vomiting. He could also have esophageal motility disorder. #3. Intermittent diarrhea secondary to dumping syndrome. He is using  Imodium on when necessary basis which is helping. #4. Mildly elevated AST may be secondary to alcoholic hepatitis. Patient is unwilling to give up drinking alcohol. He states alcohol is only thing which soothes his stomach and decrease his pain. #5. Regarding ED patient will follow with PCP.  Plan:  Consultation with Dr. Jyl Heinz of [facility]. Patient advised to keep alcohol intake to no more than 2 cans of beer daily since he is unwilling to give up alcohol. Barium pill esophagogram. Office visit in 4 months at which time we will repeat LFTs.

## 2015-01-19 NOTE — Patient Instructions (Signed)
Remember you cannot drink more than 2 cans of beer per day. Patient will call with results of barium study. Consultation with Dr. Crisoforo Oxford of [facility] to to be arranged

## 2015-01-21 ENCOUNTER — Ambulatory Visit (HOSPITAL_COMMUNITY)
Admission: RE | Admit: 2015-01-21 | Discharge: 2015-01-21 | Disposition: A | Discharge status: Home / Self Care | Payer: Medicaid Other | Source: Ambulatory Visit | Attending: Internal Medicine | Admitting: Internal Medicine

## 2015-01-21 ENCOUNTER — Encounter (INDEPENDENT_AMBULATORY_CARE_PROVIDER_SITE_OTHER): Payer: Self-pay | Admitting: *Deleted

## 2015-01-21 DIAGNOSIS — R1314 Dysphagia, pharyngoesophageal phase: Secondary | ICD-10-CM | POA: Diagnosis not present

## 2015-01-21 DIAGNOSIS — R1310 Dysphagia, unspecified: Secondary | ICD-10-CM

## 2015-01-21 DIAGNOSIS — R1319 Other dysphagia: Secondary | ICD-10-CM

## 2015-01-26 ENCOUNTER — Other Ambulatory Visit (INDEPENDENT_AMBULATORY_CARE_PROVIDER_SITE_OTHER): Payer: Self-pay | Admitting: Internal Medicine

## 2015-01-26 ENCOUNTER — Encounter (INDEPENDENT_AMBULATORY_CARE_PROVIDER_SITE_OTHER): Payer: Self-pay | Admitting: *Deleted

## 2015-01-26 ENCOUNTER — Other Ambulatory Visit (INDEPENDENT_AMBULATORY_CARE_PROVIDER_SITE_OTHER): Payer: Self-pay | Admitting: *Deleted

## 2015-01-26 DIAGNOSIS — R1310 Dysphagia, unspecified: Secondary | ICD-10-CM

## 2015-01-26 MED ORDER — LORAZEPAM 1 MG PO TABS: 1.0000 mg | ORAL_TABLET | Freq: Two times a day (BID) | ORAL | Status: DC

## 2015-01-26 NOTE — Telephone Encounter (Signed)
This encounter was created in error - please disregard.

## 2015-01-26 NOTE — Telephone Encounter (Signed)
Please note that I have tried printing x 2. I finally had one to print.

## 2015-02-12 ENCOUNTER — Ambulatory Visit (HOSPITAL_COMMUNITY)
Admission: RE | Admit: 2015-02-12 | Discharge: 2015-02-12 | Disposition: A | Discharge status: Home / Self Care | Payer: Medicaid Other | Source: Ambulatory Visit | Attending: Internal Medicine | Admitting: Internal Medicine

## 2015-02-12 ENCOUNTER — Encounter (HOSPITAL_COMMUNITY): Payer: Self-pay | Admitting: *Deleted

## 2015-02-12 ENCOUNTER — Encounter (HOSPITAL_COMMUNITY)
Admission: RE | Disposition: A | Discharge status: Home / Self Care | Payer: Self-pay | Source: Ambulatory Visit | Attending: Internal Medicine

## 2015-02-12 DIAGNOSIS — K3184 Gastroparesis: Secondary | ICD-10-CM | POA: Diagnosis not present

## 2015-02-12 DIAGNOSIS — K911 Postgastric surgery syndromes: Secondary | ICD-10-CM | POA: Insufficient documentation

## 2015-02-12 DIAGNOSIS — R1310 Dysphagia, unspecified: Secondary | ICD-10-CM | POA: Diagnosis not present

## 2015-02-12 DIAGNOSIS — K222 Esophageal obstruction: Secondary | ICD-10-CM | POA: Insufficient documentation

## 2015-02-12 DIAGNOSIS — Z79899 Other long term (current) drug therapy: Secondary | ICD-10-CM | POA: Diagnosis not present

## 2015-02-12 DIAGNOSIS — M549 Dorsalgia, unspecified: Secondary | ICD-10-CM | POA: Insufficient documentation

## 2015-02-12 DIAGNOSIS — I10 Essential (primary) hypertension: Secondary | ICD-10-CM | POA: Diagnosis not present

## 2015-02-12 DIAGNOSIS — G43909 Migraine, unspecified, not intractable, without status migrainosus: Secondary | ICD-10-CM | POA: Insufficient documentation

## 2015-02-12 DIAGNOSIS — K5090 Crohn's disease, unspecified, without complications: Secondary | ICD-10-CM | POA: Diagnosis not present

## 2015-02-12 DIAGNOSIS — F17210 Nicotine dependence, cigarettes, uncomplicated: Secondary | ICD-10-CM | POA: Insufficient documentation

## 2015-02-12 DIAGNOSIS — Z931 Gastrostomy status: Secondary | ICD-10-CM | POA: Insufficient documentation

## 2015-02-12 DIAGNOSIS — Z8673 Personal history of transient ischemic attack (TIA), and cerebral infarction without residual deficits: Secondary | ICD-10-CM | POA: Insufficient documentation

## 2015-02-12 DIAGNOSIS — K3189 Other diseases of stomach and duodenum: Secondary | ICD-10-CM | POA: Diagnosis not present

## 2015-02-12 DIAGNOSIS — K7010 Alcoholic hepatitis without ascites: Secondary | ICD-10-CM | POA: Diagnosis not present

## 2015-02-12 DIAGNOSIS — Z886 Allergy status to analgesic agent status: Secondary | ICD-10-CM | POA: Insufficient documentation

## 2015-02-12 DIAGNOSIS — M1990 Unspecified osteoarthritis, unspecified site: Secondary | ICD-10-CM | POA: Insufficient documentation

## 2015-02-12 DIAGNOSIS — K219 Gastro-esophageal reflux disease without esophagitis: Secondary | ICD-10-CM | POA: Diagnosis not present

## 2015-02-12 DIAGNOSIS — G8929 Other chronic pain: Secondary | ICD-10-CM | POA: Diagnosis not present

## 2015-02-12 HISTORY — PX: ESOPHAGEAL DILATION: SHX303

## 2015-02-12 HISTORY — PX: ESOPHAGOGASTRODUODENOSCOPY: SHX5428

## 2015-02-12 SURGERY — EGD (ESOPHAGOGASTRODUODENOSCOPY)
Anesthesia: Moderate Sedation

## 2015-02-12 MED ORDER — MEPERIDINE HCL 50 MG/ML IJ SOLN
INTRAMUSCULAR | Status: AC
  Filled 2015-02-12: qty 1

## 2015-02-12 MED ORDER — STERILE WATER FOR IRRIGATION IR SOLN
Status: DC | PRN
  Administered 2015-02-12: 14:00:00

## 2015-02-12 MED ORDER — PROMETHAZINE HCL 25 MG/ML IJ SOLN
INTRAMUSCULAR | Status: DC
Start: 2015-02-12 — End: 2015-02-12
  Filled 2015-02-12: qty 1

## 2015-02-12 MED ORDER — SODIUM CHLORIDE 0.9 % IV SOLN
INTRAVENOUS | Status: DC
  Administered 2015-02-12: 1000 mL via INTRAVENOUS

## 2015-02-12 MED ORDER — MIDAZOLAM HCL 5 MG/5ML IJ SOLN
INTRAMUSCULAR | Status: DC | PRN
  Administered 2015-02-12 (×2): 3 mg via INTRAVENOUS
  Administered 2015-02-12 (×2): 2 mg via INTRAVENOUS

## 2015-02-12 MED ORDER — PROMETHAZINE HCL 25 MG/ML IJ SOLN
INTRAMUSCULAR | Status: DC | PRN
  Administered 2015-02-12: 12.5 mg via INTRAVENOUS

## 2015-02-12 MED ORDER — BUTAMBEN-TETRACAINE-BENZOCAINE 2-2-14 % EX AERO
INHALATION_SPRAY | CUTANEOUS | Status: DC | PRN
  Administered 2015-02-12: 2 via TOPICAL

## 2015-02-12 MED ORDER — MIDAZOLAM HCL 5 MG/5ML IJ SOLN
INTRAMUSCULAR | Status: AC
  Filled 2015-02-12: qty 10

## 2015-02-12 MED ORDER — MEPERIDINE HCL 50 MG/ML IJ SOLN
INTRAMUSCULAR | Status: DC | PRN
  Administered 2015-02-12 (×2): 25 mg

## 2015-02-12 NOTE — Discharge Instructions (Signed)
Resume usual medications. Resume usual diet. Remember to eat 6 small meals daily. No driving for 24 hours. Please call office with progress report in one week   .Esophagogastroduodenoscopy  Care After Refer to this sheet in the next few weeks. These instructions provide you with information on caring for yourself after your procedure. Your caregiver may also give you more specific instructions. Your treatment has been planned according to current medical practices, but problems sometimes occur. Call your caregiver if you have any problems or questions after your procedure.  HOME CARE INSTRUCTIONS  Do not eat or drink anything until the numbing medicine (local anesthetic) has worn off and your gag reflex has returned. You will know that the local anesthetic has worn off when you can swallow comfortably.  Do not drive for 12 hours after the procedure or as directed by your caregiver.  Only take medicines as directed by your caregiver. SEEK MEDICAL CARE IF:   You cannot stop coughing.  You are not urinating at all or less than usual. SEEK IMMEDIATE MEDICAL CARE IF:  You have difficulty swallowing.  You cannot eat or drink.  You have worsening throat or chest pain.  You have dizziness, lightheadedness, or you faint.  You have nausea or vomiting.  You have chills.  You have a fever.  You have severe abdominal pain.  You have black, tarry, or bloody stools. Document Released: 07/31/2012 Document Reviewed: 07/31/2012 [facility] Patient Information 2015 [place]. This information is not intended to replace advice given to you by your health care provider. Make sure you discuss any questions you have with your health care provider.

## 2015-02-12 NOTE — H&P (Signed)
Gary Vasquez is an 64 y.o. male.   Chief Complaint: Patient is here for EGD and ED. HPI: Patient is 64 year old Caucasian male with complicated GI history who underwent EGD with dilation in March this year. He was noted to have stricture at GE junction. Esophagus was dilated with 56 French Maloney dilator. Dilation provided relief for few weeks. 3 weeks ago he had barium pill study revealing stricture or ring at distal esophagus delaying passage of barium pill. He is having dysphagia multiple times a week if not daily. He is therefore returning for repeat dilation. He continues to experience nausea vomiting almost every day. He was referred to [facility] and is undergoing evaluation by Dr. Abran Richard.  Past Medical History  Diagnosis Date  . Crohn disease   . Acid reflux   . Essential hypertension, benign   . Alcoholic hepatitis   . Arthritis   . Dumping syndrome   . Paraesophageal hernia   . Chronic diarrhea   . Subdural hematoma   . Gastroparesis   . Migraine headache   . Chronic back pain   . TIA (transient ischemic attack)   . Bulging discs     Past Surgical History  Procedure Laterality Date  . Nissen fundoplication    . Colon resection Right   . Pyloroplasty  2009  . Colonoscopy N/A 07/02/2013    Procedure: COLONOSCOPY;  Surgeon: Rogene Houston, MD;  [facility];  Service: Endoscopy;  Laterality: N/A;  200  . Esophagogastroduodenoscopy N/A 08/14/2013    Procedure: ESOPHAGOGASTRODUODENOSCOPY (EGD);  Surgeon: Rogene Houston, MD;  [facility];  Service: Endoscopy;  Laterality: N/A;  300  . Esophagogastroduodenoscopy N/A 10/30/2014    Procedure: ESOPHAGOGASTRODUODENOSCOPY (EGD);  Surgeon: Rogene Houston, MD;  [facility];  Service: Endoscopy;  Laterality: N/A;  940  . Esophageal dilation N/A 10/30/2014    Procedure: ESOPHAGEAL DILATION;  Surgeon: Rogene Houston, MD;  [facility];  Service: Endoscopy;  Laterality: N/A;    Family History   Problem Relation Age of Onset  . Healthy Daughter   . Healthy Son   . Heart attack Mother     CABG  . Stroke Mother   . Heart attack Father     CABG  . Heart attack Brother     CABG   Social History:  reports that he has been smoking Cigarettes.  He has a 10.5 pack-year smoking history. He has never used smokeless tobacco. He reports that he drinks about 1.8 - 2.4 oz of alcohol per week. He reports that he does not use illicit drugs.  Allergies:  Allergies  Allergen Reactions  . Acetaminophen Other (See Comments)    Liver issues    Medications Prior to Admission  Medication Sig Dispense Refill  . gabapentin (NEURONTIN) 300 MG capsule Take 300-600 mg by mouth 4 (four) times daily. 1 capsule in the morning, noon and evening, and 2 capsules at bedtime.    Marland Kitchen ibuprofen (ADVIL,MOTRIN) 200 MG tablet Take 600 mg by mouth every 6 (six) hours as needed for moderate pain.    Marland Kitchen loperamide (IMODIUM) 2 MG capsule Take 2 mg by mouth as needed for diarrhea or loose stools.    Marland Kitchen loratadine (CLARITIN) 10 MG tablet Take 10 mg by mouth daily.     Marland Kitchen LORazepam (ATIVAN) 1 MG tablet Take 1 tablet (1 mg total) by mouth 2 (two) times daily. 60 tablet 0  . Multiple Vitamin (MULTIVITAMIN WITH MINERALS)  TABS tablet Take 1 tablet by mouth daily.    . traMADol (ULTRAM) 50 MG tablet Take 100 mg by mouth 3 (three) times daily as needed for moderate pain.     Marland Kitchen omeprazole (PRILOSEC) 20 MG capsule Take 20 mg by mouth daily as needed (acid reflux).    . ranitidine (ZANTAC) 150 MG tablet Take 1 tablet (150 mg total) by mouth at bedtime. (Patient taking differently: Take 150 mg by mouth at bedtime as needed for heartburn. ) 60 tablet 5    No results found for this or any previous visit (from the past 48 hour(s)). No results found.  ROS  Blood pressure 149/92, pulse 84, temperature 97.2 F (36.2 C), temperature source Oral, resp. rate 16, height 6' 1"  (1.854 m), weight 187 lb (84.823 kg), SpO2 96 %. Physical Exam   Constitutional: He appears well-developed and well-nourished.  HENT:  Mouth/Throat: Oropharynx is clear and moist.  Eyes: Conjunctivae are normal. No scleral icterus.  Neck: No thyromegaly present.  Cardiovascular: Normal rate, regular rhythm and normal heart sounds.   No murmur heard. Respiratory: Effort normal and breath sounds normal.  GI: Soft. Tenderness: mild midepigastric tenderness.  Musculoskeletal: He exhibits no edema.  Lymphadenopathy:    He has no cervical adenopathy.  Neurological: He is alert.  Skin: Skin is warm and dry.     Assessment/Plan Recurrent solid food dysphagia. Chronic GERD and history of distal esophageal stricture. EGD with ED.  Gary Vasquez U 02/12/2015, 1:35 PM

## 2015-02-12 NOTE — Op Note (Signed)
EGD PROCEDURE REPORT  PATIENT:  Gary Vasquez  MR#:  [id number] Birthdate:  Jan 19, 1951, 64 y.o., male Endoscopist:  Dr. Rogene Houston, MD  Procedure Date: 02/12/2015  Procedure:   EGD with ED  Indications:  Patient is 64 year old Caucasian male with complicated GI history who presents with recurrent solid food dysphagia. He underwent EGD on 10/30/2014 and noted to have soft stricture at GE junction which was dilated to 72 [place] with [facility] dilator. He had relief of dysphagia only for few weeks. Barium pill study was performed 3 weeks ago and he has stricture at distal esophagus delaying passage of barium pill. He also has daily postprandial nausea and vomiting secondary to duodenal gastric bile reflux for which she is undergoing evaluation at [facility] to determined if he could undergo Roux-en-Y procedure.            Informed Consent:  The risks, benefits, alternatives & imponderables which include, but are not limited to, bleeding, infection, perforation, drug reaction and potential missed lesion have been reviewed.  The potential for biopsy, lesion removal, esophageal dilation, etc. have also been discussed.  Questions have been answered.  All parties agreeable.  Please see history & physical in medical record for more information.  Medications:  Demerol 50 mg IV Versed 10 mg IV Promethazine 12.5 mg IV and diluted form. Cetacaine spray topically for oropharyngeal anesthesia  Description of procedure:  The endoscope was introduced through the mouth and advanced to the second portion of the duodenum without difficulty or limitations. The mucosal surfaces were surveyed very carefully during advancement of the scope and upon withdrawal.  Findings:  Esophagus:  Mucosa of the esophagus was normal. Soft stricture noted at GE junction. Scope passed across it without any difficulty. GEJ:  44 cm Stomach:  Moderate amount of bile noted in the stomach. Folds in the proximal stomach were normal.  Mucosa revealed patchy erythema but no erosions or ulcers noted. Pyloric channel was patent. Larger wide-open gastrojejunostomy noted. Duodenum:  Not examined. Jejunum: Both afferent and the friends loops are examined and in no abnormality noted.  Therapeutic/Diagnostic Maneuvers Performed:   Stricture at GE junction was dilated with balloon dilator. Balloon.was passed through the scope. Guidewire was pushed in the gastric lumen. Balloon was positioned across GE junction and insufflated.meter of 18 mm and maintained for a few seconds. Balloon was deflated. No mucosal disruption noted. Balloon was then insufflated to a diameter of 19 mm and maintained for a few seconds. This resulted in mucosal disruption at two sites. Balloon was deflated and withdrawn. The endoscope was then also withdrawn.  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 Large amount of bile in the stomach with wide open gastrojejunostomy and normal afferent any frank loops. Patent pylorus.  Recommendations:  Standard instructions given. Patient advised to call office with progress report in one week.  Pernie Grosso U  02/12/2015  2:04 PM  CC: Dr. Lanette Hampshire, MD & Dr. Rayne Du ref. provider found

## 2015-02-16 ENCOUNTER — Encounter (HOSPITAL_COMMUNITY): Payer: Self-pay | Admitting: Internal Medicine

## 2015-02-16 ENCOUNTER — Other Ambulatory Visit (INDEPENDENT_AMBULATORY_CARE_PROVIDER_SITE_OTHER): Payer: Self-pay | Admitting: Internal Medicine

## 2015-05-25 ENCOUNTER — Encounter (INDEPENDENT_AMBULATORY_CARE_PROVIDER_SITE_OTHER): Payer: Self-pay | Admitting: Internal Medicine

## 2015-05-25 ENCOUNTER — Ambulatory Visit (INDEPENDENT_AMBULATORY_CARE_PROVIDER_SITE_OTHER): Payer: Self-pay | Admitting: Internal Medicine

## 2015-05-25 VITALS — BP 108/82 | HR 80 | Temp 97.6°F | Resp 18 | Ht 73.0 in | Wt 179.0 lb

## 2015-05-25 DIAGNOSIS — K5090 Crohn's disease, unspecified, without complications: Secondary | ICD-10-CM

## 2015-05-25 DIAGNOSIS — G8929 Other chronic pain: Secondary | ICD-10-CM

## 2015-05-25 DIAGNOSIS — M545 Low back pain, unspecified: Secondary | ICD-10-CM

## 2015-05-25 DIAGNOSIS — K219 Gastro-esophageal reflux disease without esophagitis: Secondary | ICD-10-CM

## 2015-05-25 DIAGNOSIS — R1013 Epigastric pain: Secondary | ICD-10-CM

## 2015-05-25 DIAGNOSIS — R1114 Bilious vomiting: Secondary | ICD-10-CM

## 2015-05-25 DIAGNOSIS — F419 Anxiety disorder, unspecified: Secondary | ICD-10-CM

## 2015-05-25 LAB — HEPATIC FUNCTION PANEL
ALT: 24 U/L (ref 9–46)
AST: 32 U/L (ref 10–35)
Albumin: 4.1 g/dL (ref 3.6–5.1)
Alkaline Phosphatase: 81 U/L (ref 40–115)
BILIRUBIN INDIRECT: 0.3 mg/dL (ref 0.2–1.2)
Bilirubin, Direct: 0.2 mg/dL (ref <=0.2)
TOTAL PROTEIN: 7.4 g/dL (ref 6.1–8.1)
Total Bilirubin: 0.5 mg/dL (ref 0.2–1.2)

## 2015-05-25 MED ORDER — LORAZEPAM 1 MG PO TABS: 1.0000 mg | ORAL_TABLET | Freq: Two times a day (BID) | ORAL | Status: DC

## 2015-05-25 MED ORDER — OMEPRAZOLE 40 MG PO CPDR: 40.0000 mg | DELAYED_RELEASE_CAPSULE | Freq: Two times a day (BID) | ORAL | Status: DC

## 2015-05-25 MED ORDER — TRAMADOL HCL 50 MG PO TABS: ORAL_TABLET | ORAL | Status: DC

## 2015-05-25 NOTE — Patient Instructions (Addendum)
Keep ibuprofen use to minimum and do not take more than 400 mg up to twice a day as needed. Physician will call with results of blood test when completed.

## 2015-05-25 NOTE — Progress Notes (Signed)
Presenting complaint;  Follow-up for nausea vomiting and epigastric pain dysphagia and anxiety.  Subjective:  Gary Vasquez is 64 year old Caucasian male who is here for scheduled visit. He was last seen on 01/19/2015. He underwent EGD with dilation on 02/12/2015. He is able to use swallow much better. He is not having sporadic difficulty. He continues to have postprandial nausea and vomiting were surely every day. He has 2-3 episodes. Vomiting occurs after meals and he usually vomits bile and occasionally food particles. He was seen by Dr. Abran Richard of [facility]. Patient tells me that he was informed that nothing could be guaranteed then he could have more problems. He therefore decided against surgery. He denies hematemesis melena or rectal bleeding. He has 4-5 bowel movements per day. Most of his stools are formed but every now and then its loose. He complains of epigastric pain as well as low back pain. He is taking tramadol for both of these pains. He continues to drink alcohol every day. He states that's only thing that calms his GI tract. He reassures me that he drinks no more than 2 drinks per day. He has lost 10 pounds since his last visit 4 months ago. He states he rarely eats more than 1 or 2 meals a day.   Current Medications: Outpatient Encounter Prescriptions as of 05/25/2015  Medication Sig  . gabapentin (NEURONTIN) 300 MG capsule Take 300-600 mg by mouth 4 (four) times daily. 1 capsule in the morning, noon and evening, and 2 capsules at bedtime.  Marland Kitchen ibuprofen (ADVIL,MOTRIN) 200 MG tablet Take 600 mg by mouth every 6 (six) hours as needed for moderate pain.  Marland Kitchen loperamide (IMODIUM) 2 MG capsule Take 2 mg by mouth as needed for diarrhea or loose stools.  Marland Kitchen loratadine (CLARITIN) 10 MG tablet Take 10 mg by mouth daily.   Marland Kitchen LORazepam (ATIVAN) 1 MG tablet TAKE ONE TABLET TWICE DAILY  . Multiple Vitamin (MULTIVITAMIN WITH MINERALS) TABS tablet Take 1 tablet by mouth daily.  Marland Kitchen omeprazole (PRILOSEC) 40 MG  capsule Take 40 mg by mouth 2 (two) times daily.  . ranitidine (ZANTAC) 150 MG tablet Take 1 tablet (150 mg total) by mouth at bedtime. (Patient taking differently: Take 150 mg by mouth at bedtime as needed for heartburn. )  . traMADol (ULTRAM) 50 MG tablet Take 100 mg by mouth 3 (three) times daily as needed for moderate pain.   . [DISCONTINUED] omeprazole (PRILOSEC) 20 MG capsule Take 20 mg by mouth daily as needed (acid reflux).   No facility-administered encounter medications on file as of 05/25/2015.    Objective: Blood pressure 108/82, pulse 80, temperature 97.6 F (36.4 C), temperature source Oral, resp. rate 18, height 6\' 1"  (1.854 m), weight 179 lb (81.194 kg). Patient is alert and in no acute distress. Conjunctiva is pink. Sclera is nonicteric Oropharyngeal mucosa is normal. No neck masses or thyromegaly noted. Cardiac exam with regular rhythm normal S1 and S2. No murmur or gallop noted. Lungs are clear to auscultation. Abdomen is full but soft with mild midepigastric tenderness. No organomegaly or masses. No LE edema or clubbing noted.  Labs/studies Results: Lab data from 12/30/2014  WBC 7.1, H&H 14.1 and 41.1 and platelet count 180 K   serum sodium 135, potassium 4.4, chloride 99, CO2 23, BUN 7, creatinine 1.10 and glucose 114 Bilirubin 0.6, AP 68, AST 51, ALT 37, total protein 7.4 and albumin 4.0. Calcium 9.1.   Assessment:  #1. Chronic nausea and vomiting secondary to bile reflux. Patient was evaluated  by Dr. Demetrio Lapping of [facility] for consideration of changing B2 to Roux-en-Y anastomosis. He did not recommend surgery because of risks. #2. Gastro-esophageal reflux disease on located by disc esophageal stricture. Dysphagia has improved since dilation of June 2016 #3. Anxiety disorder. #4. Chronic epigastric pain. #5. Elevated AST most likely due to alcoholic hepatitis or fatty liver. #6. Chronic low back pain. #7. History of Crohn's disease. He remains in  remission.  Plan:  New prescription given for tramadol 50 mg tablets 1-2 three times a day when necessary 60 doses without refill. New prescription given for lorazepam 1 mg by mouth twice a day 60 with 2 refills. New prescription given for omeprazole 40 mg by mouth twice a day. Patient advised to take ibuprofen no more than 4 mg by mouth twice a day when necessary. Will check LFTs. Patient advised to eat 6 small meals daily. Office visit in 3 months.

## 2015-07-28 ENCOUNTER — Other Ambulatory Visit (INDEPENDENT_AMBULATORY_CARE_PROVIDER_SITE_OTHER): Payer: Self-pay | Admitting: Internal Medicine

## 2015-07-28 MED ORDER — LORAZEPAM 1 MG PO TABS: 1.0000 mg | ORAL_TABLET | Freq: Two times a day (BID) | ORAL | Status: DC

## 2015-07-28 NOTE — Telephone Encounter (Signed)
I have tried ordering his Ativan but my printer will not print x 2.

## 2015-07-29 ENCOUNTER — Telehealth (INDEPENDENT_AMBULATORY_CARE_PROVIDER_SITE_OTHER): Payer: Self-pay | Admitting: Internal Medicine

## 2015-07-29 ENCOUNTER — Other Ambulatory Visit (INDEPENDENT_AMBULATORY_CARE_PROVIDER_SITE_OTHER): Payer: Self-pay | Admitting: Internal Medicine

## 2015-07-29 NOTE — Telephone Encounter (Signed)
I have tried to print his Rx for Lorazepam x 3. Unable to print.

## 2015-08-02 ENCOUNTER — Ambulatory Visit: Payer: Self-pay | Admitting: Family Medicine

## 2015-08-05 ENCOUNTER — Telehealth (INDEPENDENT_AMBULATORY_CARE_PROVIDER_SITE_OTHER): Payer: Self-pay | Admitting: Internal Medicine

## 2015-08-05 ENCOUNTER — Other Ambulatory Visit (INDEPENDENT_AMBULATORY_CARE_PROVIDER_SITE_OTHER): Payer: Self-pay | Admitting: Internal Medicine

## 2015-08-05 MED ORDER — LORAZEPAM 1 MG PO TABS: 1.0000 mg | ORAL_TABLET | Freq: Two times a day (BID) | ORAL | Status: DC

## 2015-08-05 NOTE — Telephone Encounter (Signed)
He shouldn't call stating hearing loss lorazepam prescription. Will give another prescription. She needs to be careful in future.

## 2015-08-06 ENCOUNTER — Encounter: Payer: Self-pay | Admitting: Gastroenterology

## 2015-08-31 ENCOUNTER — Encounter (INDEPENDENT_AMBULATORY_CARE_PROVIDER_SITE_OTHER): Payer: Self-pay | Admitting: Internal Medicine

## 2015-08-31 ENCOUNTER — Ambulatory Visit (INDEPENDENT_AMBULATORY_CARE_PROVIDER_SITE_OTHER): Payer: BLUE CROSS/BLUE SHIELD | Admitting: Internal Medicine

## 2015-08-31 VITALS — BP 140/96 | HR 86 | Temp 98.8°F | Resp 18 | Ht 73.0 in | Wt 181.8 lb

## 2015-08-31 DIAGNOSIS — R1013 Epigastric pain: Secondary | ICD-10-CM | POA: Diagnosis not present

## 2015-08-31 DIAGNOSIS — F419 Anxiety disorder, unspecified: Secondary | ICD-10-CM | POA: Diagnosis not present

## 2015-08-31 DIAGNOSIS — K219 Gastro-esophageal reflux disease without esophagitis: Secondary | ICD-10-CM | POA: Diagnosis not present

## 2015-08-31 DIAGNOSIS — R1114 Bilious vomiting: Secondary | ICD-10-CM | POA: Diagnosis not present

## 2015-08-31 DIAGNOSIS — M545 Low back pain, unspecified: Secondary | ICD-10-CM

## 2015-08-31 DIAGNOSIS — G8929 Other chronic pain: Secondary | ICD-10-CM

## 2015-08-31 MED ORDER — OMEPRAZOLE 40 MG PO CPDR: 40.0000 mg | DELAYED_RELEASE_CAPSULE | Freq: Every day | ORAL | Status: DC

## 2015-08-31 MED ORDER — TRAMADOL HCL 50 MG PO TABS: 50.0000 mg | ORAL_TABLET | Freq: Three times a day (TID) | ORAL | Status: DC | PRN

## 2015-08-31 MED ORDER — LORAZEPAM 1 MG PO TABS: 1.0000 mg | ORAL_TABLET | Freq: Two times a day (BID) | ORAL | Status: DC

## 2015-08-31 NOTE — Patient Instructions (Signed)
Keep ibuprofen use to minimum.

## 2015-08-31 NOTE — Progress Notes (Signed)
Presenting complaint;  Follow-up for multiple problems.  Subjective:  Gary Vasquez is 65 year old Caucasian male was here for scheduled visit. He was last seen on 05/25/2015. He has multiple complaints. Despite his complaints he feels his quality of life is improved in the last one year. He is eating multiple small meals. He still vomits twice a day. Unless vomiting occurs within few minutes of his meals he generally vomits bile. He denies hematemesis melena or rectal bleeding. He has 5-6 stools per day. Most of his stools are small volume there soft mushy to lose watery. Every now and then he may have formed stool. He takes Imodium once or twice a week. If he takes it more frequently bowel movement decreases and he becomes bloated and he does not like this feeling. He can needs to complain of back pain as well as epigastric pain and he is using tramadol for both. He presently does not have primary care physician since Dr. Everette Rank has retired. He is hoping to establish care again with Dr. Crissie Sickles who had been his physician for number of fears. He reassures me that he takes no more than 3-4 tablets of ibuprofen per month. He drinks no more than 2 cans of the day. He is trying to quit cigarette smoking and now down to 5 cigarettes per day.   Current Medications: Outpatient Encounter Prescriptions as of 08/31/2015  Medication Sig  . gabapentin (NEURONTIN) 300 MG capsule Take 300-600 mg by mouth 4 (four) times daily. 1 capsule in the morning, noon and evening, and 2 capsules at bedtime.  Marland Kitchen ibuprofen (ADVIL,MOTRIN) 200 MG tablet Take 400 mg by mouth 2 (two) times daily as needed.  . loperamide (IMODIUM) 2 MG capsule Take 2 mg by mouth as needed for diarrhea or loose stools.  Marland Kitchen loratadine (CLARITIN) 10 MG tablet Take 10 mg by mouth daily.   Marland Kitchen LORazepam (ATIVAN) 1 MG tablet Take 1 tablet (1 mg total) by mouth 2 (two) times daily.  . Multiple Vitamin (MULTIVITAMIN WITH MINERALS) TABS tablet Take 1 tablet by  mouth daily.  Marland Kitchen omeprazole (PRILOSEC) 40 MG capsule Take 1 capsule (40 mg total) by mouth 2 (two) times daily.  . ranitidine (ZANTAC) 150 MG tablet Take 1 tablet (150 mg total) by mouth at bedtime. (Patient taking differently: Take 150 mg by mouth at bedtime as needed for heartburn. )  . traMADol (ULTRAM) 50 MG tablet 1-2 tablets by mouth up to 3 times a day as needed for pain   No facility-administered encounter medications on file as of 08/31/2015.     Objective: Blood pressure 140/96, pulse 86, temperature 98.8 F (37.1 C), temperature source Oral, resp. rate 18, height 6' 1"  (1.854 m), weight 181 lb 12.8 oz (82.464 kg). Patient is alert and in no acute distress. Conjunctiva is pink. Sclera is nonicteric Oropharyngeal mucosa is normal. No neck masses or thyromegaly noted. Cardiac exam with regular rhythm normal S1 and S2. No murmur or gallop noted. Lungs are clear to auscultation. Abdomen is full. Bowel sounds are normal. Abdomen is soft with mild midepigastric tenderness. No organomegaly or masses.  No LE edema or clubbing noted.  Labs/studies Results: LFTs on [id number]  Bilirubin 0.5, AP 81, AST 32, ALT 24, total protein 7.4 and albumin 4.1.   AST and ALT were 51 and 37 on 12/30/2014.    Assessment:  #1. 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 He was evaluated by Dr. Demetrio Lapping of [facility] last year for  Roux-en-Y procedure. He decided not to proceed with surgery because of high risk. Metoclopramide resulted in diarrhea and dominant and domperodone was ineffective. I believe alcohol maybe worsening his symptom but he has not able to stop it altogether. #2. Chronic diarrhea felt to be due to altered GI tract. He has history of Crohn's disease but he has been in remission for well over 20 years. #3. GERD. Heartburn is well controlled with therapy. #4. History of alcoholic hepatitis. Transaminases over 3 months ago were normal. #5. Chronic  epigastric pain. #6. Anxiety.. #7. Chronic low back pain.   Plan:  Once again patient advised that he should quit drinking alcohol altogether and should keep ibuprofen use to minimum. New prescription given for alprazolam 1 mg by mouth twice a day for 1 month with 2 refills. New prescription also given for tramadol 50 mg 3 times a day as needed. 60 doses with one refill. Omeprazole prescription changed to once a day. Office visit in 6 months.

## 2015-11-09 ENCOUNTER — Telehealth (INDEPENDENT_AMBULATORY_CARE_PROVIDER_SITE_OTHER): Payer: Self-pay | Admitting: Internal Medicine

## 2015-11-09 NOTE — Telephone Encounter (Signed)
Per Dr.Rehman give patient an appointment sooner than 03/07/2016.

## 2015-11-09 NOTE — Telephone Encounter (Signed)
[name] called saying Dr. Laural Golden wanted to know if Gary Vasquez begins losing weight again and according to [name], he is. She said since last Friday after he eats, the vomiting has become worse. In addition to that he's losing weight. He currently has an appointment scheduled on July 11th but she's wondering if he needs to be seen sooner. Please call the patient if needed.  Pt's ph# [phone] Thank you.

## 2015-11-10 ENCOUNTER — Encounter (INDEPENDENT_AMBULATORY_CARE_PROVIDER_SITE_OTHER): Payer: Self-pay | Admitting: Internal Medicine

## 2015-11-10 NOTE — Telephone Encounter (Signed)
Pt called back and is scheduled on March 28th, 2017 at 1:15pm. Thank you.

## 2015-11-10 NOTE — Telephone Encounter (Signed)
I left a message on the phone number listed below for the patient to call back and schedule an appt sooner than July. I also tried calling both numbers listed in EPIC but was unable to leave a message at either number. Thank you.

## 2015-11-12 ENCOUNTER — Encounter: Payer: Self-pay | Admitting: Family Medicine

## 2015-11-12 ENCOUNTER — Ambulatory Visit (INDEPENDENT_AMBULATORY_CARE_PROVIDER_SITE_OTHER): Payer: Medicare Other | Admitting: Family Medicine

## 2015-11-12 VITALS — BP 137/82 | HR 71 | Temp 97.8°F | Resp 16 | Ht 69.5 in | Wt 176.0 lb

## 2015-11-12 DIAGNOSIS — F329 Major depressive disorder, single episode, unspecified: Secondary | ICD-10-CM | POA: Diagnosis not present

## 2015-11-12 DIAGNOSIS — F419 Anxiety disorder, unspecified: Secondary | ICD-10-CM

## 2015-11-12 DIAGNOSIS — F411 Generalized anxiety disorder: Secondary | ICD-10-CM

## 2015-11-12 DIAGNOSIS — F1020 Alcohol dependence, uncomplicated: Secondary | ICD-10-CM

## 2015-11-12 DIAGNOSIS — M5442 Lumbago with sciatica, left side: Secondary | ICD-10-CM

## 2015-11-12 DIAGNOSIS — Z23 Encounter for immunization: Secondary | ICD-10-CM

## 2015-11-12 DIAGNOSIS — F32A Depression, unspecified: Secondary | ICD-10-CM

## 2015-11-12 DIAGNOSIS — M5441 Lumbago with sciatica, right side: Secondary | ICD-10-CM

## 2015-11-12 DIAGNOSIS — G8929 Other chronic pain: Secondary | ICD-10-CM

## 2015-11-12 MED ORDER — DULOXETINE HCL 30 MG PO CPEP: ORAL_CAPSULE | ORAL | Status: DC

## 2015-11-12 MED ORDER — LORAZEPAM 1 MG PO TABS: 1.0000 mg | ORAL_TABLET | Freq: Two times a day (BID) | ORAL | Status: DC

## 2015-11-12 NOTE — Progress Notes (Signed)
Pre visit review using our clinic review tool, if applicable. No additional management support is needed unless otherwise documented below in the visit note. 

## 2015-11-12 NOTE — Progress Notes (Addendum)
Office Note 11/13/2015  CC:  Chief Complaint  Patient presents with  . Establish Care    Pt is not fasting.    HPI:  Gary Vasquez is a 65 y.o. White male who is here to establish care and discuss his alcohol abuse. Patient's most recent primary MD: myself and Dr. Cleta Alberts about 5 yrs ago.  Has exclusively been seeing his gastroenterologist, Dr. Laural Golden, in [place]. Old records in EPIC/[facility] EMR  were reviewed prior to or during today's visit.  Probs: mainly anxiety and depression.  He is on ativan 1mg  bid long term. He recalls being on wellbutrin in the past and he says it didn't help.  He admits he needs to quit drinking but won't commit to saying that he will stop completely.  He is resistant to getting psychological help for this problem, won't attend AA, says he needs the alcohol b/c of his anxiety and depression.   Describes feeling stressed/anxious/depressed mood, irritable, poor concentration, poor energy level, anhedonia, having sleep problems.  Denies SI or HI.  Chronic low back pain, reports hx of bulging discs, says he has never seen an orthopedist or neurosurgeon.  Takes 2 tramadol per day but due to using it more than this lately he ran out of recent rx several days early.  Says "I need something for it", but when I said I would only give short term pain med IF he would quit drinking he refused to agree to this.  He was interested in referral to an orthopedist, though. Says entire LB hurts constantly and the pain radiates down L leg to the toes intermittently--esp when he tries to rest in afternoon.  Toes often feel tingly/numb.  Occ radicular pain in R leg.  He can't tell if the gabapentin he was rx'd  In the past has been helpful--has not taken it in the last month b/c he didn't think it was helping. No loss of bowel/bladder control.  No saddle anesthesia.    Looking back in EMR, the most recent L spine MRI I can find is from 7/242014:  Impression: 1. Transitional  anatomy. 2. Chronic L4-L5 disc degeneration. Small central disc herniation which might be a source of L5 and/or S1 radiculitis. No spinal stenosis. 3. No other significant disc degeneration. Multilevel lumbar facet hypertrophy  Past Medical History  Diagnosis Date  . Crohn disease ([place])   . GERD (gastroesophageal reflux disease)   . Essential hypertension, benign   . Alcoholic hepatitis   . Arthritis   . Dumping syndrome   . Paraesophageal hernia   . Chronic diarrhea   . Subdural hematoma ([place])   . Gastroparesis   . Migraine headache   . Chronic back pain   . TIA (transient ischemic attack)   . DDD (degenerative disc disease), lumbar     L lumbar radiculopathy  . Alcoholism ([place])   . Anxiety and depression   . Allergy   . History of liver failure     Past Surgical History  Procedure Laterality Date  . Nissen fundoplication    . Colon resection Right   . Pyloroplasty  2009  . Colonoscopy N/A 07/02/2013    Procedure: COLONOSCOPY;  Surgeon: Rogene Houston, MD;  Location: [facility];  Service: Endoscopy;  Laterality: N/A;  200  . Esophagogastroduodenoscopy N/A 08/14/2013    Procedure: ESOPHAGOGASTRODUODENOSCOPY (EGD);  Surgeon: Rogene Houston, MD;  Location: [facility];  Service: Endoscopy;  Laterality: N/A;  300  . Esophagogastroduodenoscopy N/A 10/30/2014  Procedure: ESOPHAGOGASTRODUODENOSCOPY (EGD);  Surgeon: Rogene Houston, MD;  Location: [facility];  Service: Endoscopy;  Laterality: N/A;  940  . Esophageal dilation N/A 10/30/2014    Procedure: ESOPHAGEAL DILATION;  Surgeon: Rogene Houston, MD;  [facility]: [facility];  Service: Endoscopy;  Laterality: N/A;  . Esophagogastroduodenoscopy N/A 02/12/2015    Procedure: ESOPHAGOGASTRODUODENOSCOPY (EGD);  Surgeon: Rogene Houston, MD;  [facility]: [facility];  Service: Endoscopy;  Laterality: N/A;  105  . Esophageal dilation N/A 02/12/2015    Procedure: ESOPHAGEAL DILATION;  Surgeon: Rogene Houston, MD;   Location: [facility];  Service: Endoscopy;  Laterality: N/A;    Family History  Problem Relation Age of Onset  . Healthy Daughter   . Healthy Son   . Heart attack Mother     CABG  . Stroke Mother   . Heart attack Father     CABG  . Heart attack Brother     CABG    Social History   Social History  . Marital Status: Divorced    Spouse Name: N/A  . Number of Children: N/A  . Years of Education: N/A   Occupational History  . Not on file.   Social History Main Topics  . Smoking status: Current Every Day Smoker -- 0.25 packs/day for 25 years    Types: Cigarettes  . Smokeless tobacco: Never Used     Comment: 2-3 cigarrettes a day  . Alcohol Use: 1.8 - 2.4 oz/week    3-4 Standard drinks or equivalent per week     Comment: Patient states that he drinks beer every day, about 4-5 beers  . Drug Use: No  . Sexual Activity: No   Other Topics Concern  . Not on file   Social History Narrative   Divorced, one daughter age 64.   Disabled secondary to intractible GERD and dumping syndrome.   Lives with elderly parents in order to take care of them: as of 2015.   Tob 6 pack-yr hx.   Alcohol: 4-6 beers per day.  Says he gave up his liquor.      Outpatient Encounter Prescriptions as of 11/12/2015  Medication Sig  . gabapentin (NEURONTIN) 300 MG capsule Take 300-600 mg by mouth 4 (four) times daily. 1 capsule in the morning, noon and evening, and 2 capsules at bedtime.  Marland Kitchen ibuprofen (ADVIL,MOTRIN) 200 MG tablet Take 400 mg by mouth 2 (two) times daily as needed.  . loperamide (IMODIUM) 2 MG capsule Take 2 mg by mouth as needed for diarrhea or loose stools.  Marland Kitchen loratadine (CLARITIN) 10 MG tablet Take 10 mg by mouth daily.   Marland Kitchen LORazepam (ATIVAN) 1 MG tablet Take 1 tablet (1 mg total) by mouth 2 (two) times daily.  . Multiple Vitamin (MULTIVITAMIN WITH MINERALS) TABS tablet Take 1 tablet by mouth daily.  Marland Kitchen omeprazole (PRILOSEC) 40 MG capsule Take 1 capsule (40 mg total) by mouth daily  before breakfast.  . ranitidine (ZANTAC) 150 MG tablet Take 1 tablet (150 mg total) by mouth at bedtime. (Patient taking differently: Take 150 mg by mouth at bedtime as needed for heartburn. )  . [DISCONTINUED] LORazepam (ATIVAN) 1 MG tablet Take 1 tablet (1 mg total) by mouth 2 (two) times daily.  . [DISCONTINUED] traMADol (ULTRAM) 50 MG tablet Take 1 tablet (50 mg total) by mouth 3 (three) times daily as needed. 1-2 tablets by mouth up to 3 times a day as needed for pain  . DULoxetine (CYMBALTA) 30  MG capsule 1-2 tabs po qd   No facility-administered encounter medications on file as of 11/12/2015.    Allergies  Allergen Reactions  . Acetaminophen Other (See Comments)    Liver issues    ROS Review of Systems  Constitutional: Positive for fatigue. Negative for fever.  HENT: Negative for congestion and sore throat.   Eyes: Negative for visual disturbance.  Respiratory: Negative for cough.   Cardiovascular: Negative for chest pain.  Gastrointestinal: Positive for nausea. Negative for abdominal pain.  Genitourinary: Negative for dysuria.  Musculoskeletal: Negative for back pain and joint swelling.  Skin: Negative for rash.  Neurological: Negative for weakness and headaches.  Hematological: Negative for adenopathy.  Psychiatric/Behavioral: Positive for sleep disturbance and dysphoric mood. Negative for suicidal ideas, hallucinations, confusion and agitation. The patient is nervous/anxious.     PE; Blood pressure 137/82, pulse 71, temperature 97.8 F (36.6 C), temperature source Oral, resp. rate 16, height 5' 9.5" (1.765 m), weight 176 lb (79.833 kg), SpO2 95 %. Gen: Alert, well appearing.  Patient is oriented to person, place, time, and situation. AFFECT: pleasant, lucid thought and speech. [id number]: no injection, icteris, swelling, or exudate.  EOMI, PERRLA. Mouth: lips without lesion/swelling.  Oral mucosa pink and moist. Oropharynx without erythema, exudate, or swelling.  CV: RRR,  S1 and S2 are distinct, soft systolic murmur heard best at LUSB.  No r/g.   LUNGS: CTA bilat, nonlabored resps, good aeration in all lung fields. ABD: soft, NT/ND Back: ROM diffusely stiff and painful.  TTP in Lumbosacral spine diffusely.   Sitting SLR + at 30 deg on L leg.  Left patellar reflex 1+, Right patellar reflex 2+. Cannot elicit any achilles reflexes.  Strength testing of Legs shows 5-/5 bilat. SKIN: no jaundice or pallor.  Pertinent labs:  Lab Results  Component Value Date   HGBA1C 5.5 03/20/2013    Lab Results  Component Value Date   TSH 2.990 01/23/2014   Lab Results  Component Value Date   WBC 7.1 12/30/2014   HGB 14.1 12/30/2014   HCT 41.1 12/30/2014   MCV 89.7 12/30/2014   PLT 180 12/30/2014   Lab Results  Component Value Date   CREATININE 1.10 12/30/2014   BUN 7 12/30/2014   NA 135 12/30/2014   K 4.4 12/30/2014   CL 99 12/30/2014   CO2 23 12/30/2014   Lab Results  Component Value Date   ALT 24 05/25/2015   AST 32 05/25/2015   ALKPHOS 81 05/25/2015   BILITOT 0.5 05/25/2015   Lab Results  Component Value Date   CHOL 165 01/24/2014   Lab Results  Component Value Date   HDL 48 01/24/2014   Lab Results  Component Value Date   LDLCALC 90 01/24/2014   Lab Results  Component Value Date   TRIG 135 01/24/2014   Lab Results  Component Value Date   CHOLHDL 3.4 01/24/2014   Lab Results  Component Value Date   VITAMINB12 1015* 03/20/2013   ASSESSMENT AND PLAN:   New pt; familiar to me from the remote past.  1) Anxiety and depression: certainly this is complicated by his alcohol use, although per his report he has cut back quite a bit on alcohol intake.   Decided to start duloxetine 30mg , 1 tab qd x 7d, then increase to 2 tabs qd.  He declines counseling at this time.  2) Chronic low back pain with L > R radiculopathy symptoms: MRI 2014 showed spondylosis and L4-5 disc degeneration that could be  causing some of his radicular sx's.  May need  updated MRI but pt is in favor of referral to ortho first.  I feel like Dr. Nelva Bush is the best person to refer him to at this time--referral ordered.  I did not rx any pain medicine, and I took tramadol off of his med list.  3) Alcoholism: pt has cut back over the years but he is not ready to commit to total abstinence at this time. I recommended total abstinence and I told him he could go to [facility] or to a privately operated facility and he declined.  4) Preventative health care: flu vaccine given today.  An After Visit Summary was printed and given to the patient.  Return in about 1 month (around 12/13/2015) for routine chronic illness f/u (30 min).  Signed:  Crissie Sickles, MD           11/13/2015

## 2015-11-13 ENCOUNTER — Encounter: Payer: Self-pay | Admitting: Family Medicine

## 2015-11-15 ENCOUNTER — Other Ambulatory Visit (INDEPENDENT_AMBULATORY_CARE_PROVIDER_SITE_OTHER): Payer: Self-pay | Admitting: Internal Medicine

## 2015-11-23 ENCOUNTER — Ambulatory Visit (INDEPENDENT_AMBULATORY_CARE_PROVIDER_SITE_OTHER): Payer: Self-pay | Admitting: Internal Medicine

## 2015-11-23 ENCOUNTER — Ambulatory Visit (INDEPENDENT_AMBULATORY_CARE_PROVIDER_SITE_OTHER): Payer: Medicare Other | Admitting: Internal Medicine

## 2015-11-23 ENCOUNTER — Encounter (INDEPENDENT_AMBULATORY_CARE_PROVIDER_SITE_OTHER): Payer: Self-pay | Admitting: Internal Medicine

## 2015-11-23 VITALS — BP 166/80 | HR 72 | Temp 98.1°F | Ht 72.0 in | Wt 177.4 lb

## 2015-11-23 DIAGNOSIS — R110 Nausea: Secondary | ICD-10-CM

## 2015-11-23 DIAGNOSIS — M545 Low back pain, unspecified: Secondary | ICD-10-CM

## 2015-11-23 DIAGNOSIS — K709 Alcoholic liver disease, unspecified: Secondary | ICD-10-CM | POA: Diagnosis not present

## 2015-11-23 LAB — HEPATIC FUNCTION PANEL
ALK PHOS: 81 U/L (ref 40–115)
ALT: 59 U/L — ABNORMAL HIGH (ref 9–46)
AST: 55 U/L — ABNORMAL HIGH (ref 10–35)
Albumin: 3.9 g/dL (ref 3.6–5.1)
Bilirubin, Direct: 0.2 mg/dL (ref <=0.2)
Indirect Bilirubin: 0.4 mg/dL (ref 0.2–1.2)
Total Bilirubin: 0.6 mg/dL (ref 0.2–1.2)
Total Protein: 7.9 g/dL (ref 6.1–8.1)

## 2015-11-23 LAB — CBC WITH DIFFERENTIAL/PLATELET
BASOS PCT: 1 % (ref 0–1)
Basophils Absolute: 0.1 10*3/uL (ref 0.0–0.1)
Eosinophils Absolute: 0.1 10*3/uL (ref 0.0–0.7)
Eosinophils Relative: 2 % (ref 0–5)
HCT: 43.3 % (ref 39.0–52.0)
Hemoglobin: 15.2 g/dL (ref 13.0–17.0)
Lymphocytes Relative: 43 % (ref 12–46)
Lymphs Abs: 2.4 10*3/uL (ref 0.7–4.0)
MCH: 32.1 pg (ref 26.0–34.0)
MCHC: 35.1 g/dL (ref 30.0–36.0)
MCV: 91.4 fL (ref 78.0–100.0)
MPV: 11.1 fL (ref 8.6–12.4)
Monocytes Absolute: 0.4 10*3/uL (ref 0.1–1.0)
Monocytes Relative: 7 % (ref 3–12)
NEUTROS ABS: 2.6 10*3/uL (ref 1.7–7.7)
NEUTROS PCT: 47 % (ref 43–77)
PLATELETS: 195 10*3/uL (ref 150–400)
RBC: 4.74 MIL/uL (ref 4.22–5.81)
RDW: 14.1 % (ref 11.5–15.5)
WBC: 5.6 10*3/uL (ref 4.0–10.5)

## 2015-11-23 MED ORDER — TRAMADOL HCL 50 MG PO TABS: ORAL_TABLET | ORAL | Status: DC

## 2015-11-23 NOTE — Patient Instructions (Signed)
OV in 6 months. 

## 2015-11-23 NOTE — Progress Notes (Addendum)
Subjective:    Patient ID: Gary Vasquez, male    DOB: 21-Apr-1951, 65 y.o.   MRN: [id number]  HPI  Here today for f/u. Hx of etoh abuse. He was last seen by Dr. Laural Golden in January of this year. Hx of chronic nausea. Per epic he has established care with Dr. Anitra Lauth. Last weight 178. He tells me he is doing better this week than last week. He has chronic nausea. He has maintained his weight   He has nausea before and after he eats. Sometimes he cannot stand to look at food. He is having about 6 BMs a day. Stools are formed sometimes and some are loose. He is using the Imodium about 2 times a weks.   Hx of Crohns disease.  He is eating about 2 meals a day.  He is drinking about 2-4 beers a day. He is smoking about 3-4 cigarettes a day.        CBC    Component Value Date/Time   WBC 7.1 12/30/2014 1559   RBC 4.58 12/30/2014 1559   HGB 14.1 12/30/2014 1559   HCT 41.1 12/30/2014 1559   PLT 180 12/30/2014 1559   MCV 89.7 12/30/2014 1559   MCH 30.8 12/30/2014 1559   MCHC 34.3 12/30/2014 1559   RDW 14.4 12/30/2014 1559   LYMPHSABS 3.1 12/30/2014 1559   MONOABS 0.9 12/30/2014 1559   EOSABS 0.1 12/30/2014 1559   BASOSABS 0.1 12/30/2014 1559    . Hepatic Function Panel     Component Value Date/Time   PROT 7.4 05/25/2015 1239   ALBUMIN 4.1 05/25/2015 1239   AST 32 05/25/2015 1239   ALT 24 05/25/2015 1239   ALKPHOS 81 05/25/2015 1239   BILITOT 0.5 05/25/2015 1239   BILIDIR 0.2 05/25/2015 1239   IBILI 0.3 05/25/2015 1239     Review of Systems Past Medical History  Diagnosis Date  . Crohn disease ([place])     in remission since approx 1995  . GERD (gastroesophageal reflux disease)   . Essential hypertension, benign   . Alcoholic hepatitis   . Arthritis   . Dumping syndrome   . Paraesophageal hernia   . Chronic diarrhea   . Subdural hematoma ([place])   . Gastroparesis   . Migraine headache   . Chronic back pain   . TIA (transient ischemic attack)   . DDD (degenerative disc  disease), lumbar     L > R lumbar radiculopathy  . Alcoholism ([place])     Still drinking as of 11/12/15  . Anxiety and depression   . Allergy   . History of liver failure     (alchoholic) Recovered in hosp  . Aspiration pneumonitis (HCC)     recurrent  . Alcoholic pancreatitis   . Bile reflux esophagitis     Chronic; due to gastrojejunostomy.  He was evaluated by Dr. Demetrio Lapping of [facility] last year for Roux-en-Y procedure. He decided not to proceed with surgery because of high risk. Metoclopramide resulted in diarrhea and dominant and domperodone was ineffective.   . DDD (degenerative disc disease), cervical 02/2013    MRI by Dr. Joneen Caraway  . Encephalomalacia on imaging study 02/2013    MRI brain w/o contrast: Mild progression of left occipital lobe encephalomalacia and  . Fatty liver 02/2013    Abd u/s   . Asymptomatic gallstones 2014    one stone    Past Surgical History  Procedure Laterality Date  . Nissen fundoplication    . Colon  resection Right     right colon resection and ileocolonic anastamosis  . Pyloroplasty  2009  . Colonoscopy N/A 07/02/2013    Procedure: COLONOSCOPY;  Surgeon: Rogene Houston, MD;  [facility];  Service: Endoscopy;  Laterality: N/A;  200  . Esophagogastroduodenoscopy N/A 08/14/2013    Procedure: ESOPHAGOGASTRODUODENOSCOPY (EGD);  Surgeon: Rogene Houston, MD;  [facility];  Service: Endoscopy;  Laterality: N/A;  300  . Esophagogastroduodenoscopy N/A 10/30/2014    Procedure: ESOPHAGOGASTRODUODENOSCOPY (EGD);  Surgeon: Rogene Houston, MD;  [facility];  Service: Endoscopy;  Laterality: N/A;  940  . Esophageal dilation N/A 10/30/2014    Procedure: ESOPHAGEAL DILATION;  Surgeon: Rogene Houston, MD;  [facility];  Service: Endoscopy;  Laterality: N/A;  . Esophagogastroduodenoscopy N/A 02/12/2015    Procedure: ESOPHAGOGASTRODUODENOSCOPY (EGD);  Surgeon: Rogene Houston, MD;  [facility];  Service:  Endoscopy;  Laterality: N/A;  105  . Esophageal dilation N/A 02/12/2015    Procedure: ESOPHAGEAL DILATION;  Surgeon: Rogene Houston, MD;  [facility];  Service: Endoscopy;  Laterality: N/A;  . Hiatal hernia repair  09/2007    He underwent repair of large hiatal hernia and pyloroplasty which was complicated by pyloroplasty leak and subsequent gastrojejunostomy  . Transthoracic echocardiogram  12/2013    EF 65-70%, mild LVH, grade I DD, no valvular problems.  . Nocturnal polysomnogram  05/2013    Severe periodic limb movement d/o: trial of dopamine agonist such as requip was recommended.  . Cardiovascular stress test  01/30/14    Myocardial perfusion imaging: NORMAL ([name]).    Allergies  Allergen Reactions  . Acetaminophen Other (See Comments)    Liver issues    Current Outpatient Prescriptions on File Prior to Visit  Medication Sig Dispense Refill  . DULoxetine (CYMBALTA) 30 MG capsule 1-2 tabs po qd 60 capsule 0  . gabapentin (NEURONTIN) 300 MG capsule Take 300-600 mg by mouth 4 (four) times daily. 1 capsule in the morning, noon and evening, and 2 capsules at bedtime.    Marland Kitchen ibuprofen (ADVIL,MOTRIN) 200 MG tablet Take 400 mg by mouth 2 (two) times daily as needed.    . loperamide (IMODIUM) 2 MG capsule Take 2 mg by mouth as needed for diarrhea or loose stools.    Marland Kitchen loratadine (CLARITIN) 10 MG tablet Take 10 mg by mouth daily.     Marland Kitchen LORazepam (ATIVAN) 1 MG tablet Take 1 tablet (1 mg total) by mouth 2 (two) times daily. 60 tablet 2  . Multiple Vitamin (MULTIVITAMIN WITH MINERALS) TABS tablet Take 1 tablet by mouth daily.    Marland Kitchen omeprazole (PRILOSEC) 40 MG capsule Take 1 capsule (40 mg total) by mouth daily before breakfast. (Patient taking differently: Take 40 mg by mouth as needed. ) 30 capsule 5  . ranitidine (ZANTAC) 150 MG tablet Take 1 tablet (150 mg total) by mouth at bedtime. (Patient taking differently: Take 150 mg by mouth 2 (two) times daily. ) 60 tablet 5  .  traMADol (ULTRAM) 50 MG tablet TAKE ONE OR TWO TABLETS UP TO THREE TIMES DAILY AS NEEDED FOR PAIN. 60 tablet 1   No current facility-administered medications on file prior to visit.        Objective:   Physical Exam Blood pressure 166/80, pulse 72, temperature 98.1 F (36.7 C), height 6' (1.829 m), weight 177 lb 6.4 oz (80.468 kg).  Alert and oriented. Skin warm and dry. Oral mucosa  is moist.   . Sclera anicteric, conjunctivae is pink. Thyroid not enlarged. No cervical lymphadenopathy. Lungs clear. Heart regular rate and rhythm.  Abdomen is soft. Bowel sounds are positive. No hepatomegaly. No abdominal masses felt. No tenderness.  No edema to lower extremities.       Assessment & Plan:   Assessment:    . 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 He was evaluated by Dr. Demetrio Lapping of [facility] last year for Roux-en-Y procedure. He decided not to proceed with surgery because of high risk.  Patient continues to drink etoh.   . Chronic diarrhea felt to be due to altered GI tract. He has history of Crohn's disease but he has been in remission for well over 20 years.   GERD. Heartburn is well controlled with therapy.  Marland Kitchen History of alcoholic hepatitis. Transaminases are normal.  Hepatic function and CBC today.   . Chronic epigastric pain.  . Chronic low back pain. Rx for Tramadol refill sent to his pharmacy

## 2015-11-25 ENCOUNTER — Telehealth (INDEPENDENT_AMBULATORY_CARE_PROVIDER_SITE_OTHER): Payer: Self-pay | Admitting: Internal Medicine

## 2015-11-25 NOTE — Telephone Encounter (Signed)
error 

## 2015-12-03 ENCOUNTER — Ambulatory Visit: Payer: Self-pay | Admitting: Family Medicine

## 2015-12-13 ENCOUNTER — Ambulatory Visit: Payer: Self-pay | Admitting: Family Medicine

## 2015-12-13 ENCOUNTER — Ambulatory Visit (INDEPENDENT_AMBULATORY_CARE_PROVIDER_SITE_OTHER): Payer: Medicare Other | Admitting: Family Medicine

## 2015-12-13 ENCOUNTER — Encounter: Payer: Self-pay | Admitting: Family Medicine

## 2015-12-13 VITALS — BP 170/112 | HR 104 | Temp 97.6°F | Resp 16 | Ht 72.0 in | Wt 171.5 lb

## 2015-12-13 DIAGNOSIS — I10 Essential (primary) hypertension: Secondary | ICD-10-CM | POA: Diagnosis not present

## 2015-12-13 DIAGNOSIS — F329 Major depressive disorder, single episode, unspecified: Secondary | ICD-10-CM

## 2015-12-13 DIAGNOSIS — F32A Depression, unspecified: Secondary | ICD-10-CM | POA: Insufficient documentation

## 2015-12-13 DIAGNOSIS — J209 Acute bronchitis, unspecified: Secondary | ICD-10-CM

## 2015-12-13 DIAGNOSIS — J018 Other acute sinusitis: Secondary | ICD-10-CM | POA: Diagnosis not present

## 2015-12-13 DIAGNOSIS — F419 Anxiety disorder, unspecified: Principal | ICD-10-CM

## 2015-12-13 DIAGNOSIS — F418 Other specified anxiety disorders: Secondary | ICD-10-CM | POA: Diagnosis not present

## 2015-12-13 DIAGNOSIS — F1020 Alcohol dependence, uncomplicated: Secondary | ICD-10-CM

## 2015-12-13 MED ORDER — IRBESARTAN 150 MG PO TABS: 150.0000 mg | ORAL_TABLET | Freq: Every day | ORAL | Status: DC

## 2015-12-13 MED ORDER — AMOXICILLIN 875 MG PO TABS: 875.0000 mg | ORAL_TABLET | Freq: Two times a day (BID) | ORAL | Status: AC

## 2015-12-13 MED ORDER — PREDNISONE 20 MG PO TABS: ORAL_TABLET | ORAL | Status: DC

## 2015-12-13 NOTE — Progress Notes (Signed)
OFFICE VISIT  12/13/2015   CC:  Chief Complaint  Patient presents with  . Follow-up    Pt is not fasting.    HPI:    Patient is a 65 y.o. Caucasian male who presents for 1 mo f/u anxiety and depression. Started duloxetine 30mg  qd and wanted him to titrate to 60mg  qd after 1 week--he was able to do this w/out problem. Says this has helped his depression: "I don't have those bad downswings I had".  He estimates about 50% improvement. Still with some depressed feeling with anger when he wakes up.  Takes ativan and this helps. Still not sleeping well: intermittent insomnia.  Appetite down last couple weeks due to what he describes as a lingering respiratory infection: runny nose/congestion with PND, some ST, eye drainage, cough, wheezing.  Cough sometimes productive of yellow phlegm.  No fevers.  Currently still smoking 5 cigs/day.  He says he saw his GI MD and got blood work (see below) but pt states no changes in management were made. Has appt with orthopedist, Dr. Nelva Bush, tomorrow for his chronic low back pain.  He is currently still drinking 2 beers per day.  Past Medical History  Diagnosis Date  . Crohn disease ([place])     in remission since approx 1995  . GERD (gastroesophageal reflux disease)   . Essential hypertension, benign   . Alcoholic hepatitis   . Arthritis   . Dumping syndrome   . Paraesophageal hernia   . Chronic diarrhea   . 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 Gastroparesis   . Migraine headache   . Chronic back pain   . TIA (transient ischemic attack)   . DDD (degenerative disc disease), lumbar     L > R lumbar radiculopathy  . Alcoholism ([place])     Still drinking as of 11/12/15  . Anxiety and depression   . Allergy   . History of liver failure     (alchoholic) Recovered in hosp  . Aspiration pneumonitis (HCC)     recurrent  . Alcoholic pancreatitis   . Bile reflux esophagitis     Chronic; due to gastrojejunostomy.  He was evaluated by Dr. Demetrio Lapping of [facility] last  year for Roux-en-Y procedure. He decided not to proceed with surgery because of high risk. Metoclopramide resulted in diarrhea and dominant and domperodone was ineffective.   . DDD (degenerative disc disease), cervical 02/2013    MRI by Dr. Joneen Caraway  . Encephalomalacia on imaging study 02/2013    MRI brain w/o contrast: Mild progression of left occipital lobe encephalomalacia and  . Fatty liver 02/2013    Abd u/s   . Asymptomatic gallstones 2014    one stone    Past Surgical History  Procedure Laterality Date  . Nissen fundoplication    . Colon resection Right     right colon resection and ileocolonic anastamosis  . Pyloroplasty  2009  . Colonoscopy N/A 07/02/2013    Procedure: COLONOSCOPY;  Surgeon: Rogene Houston, MD;  [facility];  Service: Endoscopy;  Laterality: N/A;  200  . Esophagogastroduodenoscopy N/A 08/14/2013    Procedure: ESOPHAGOGASTRODUODENOSCOPY (EGD);  Surgeon: Rogene Houston, MD;  [facility];  Service: Endoscopy;  Laterality: N/A;  300  . Esophagogastroduodenoscopy N/A 10/30/2014    Procedure: ESOPHAGOGASTRODUODENOSCOPY (EGD);  Surgeon: Rogene Houston, MD;  [facility];  Service: Endoscopy;  Laterality: N/A;  940  . Esophageal dilation N/A 10/30/2014    Procedure: ESOPHAGEAL DILATION;  Surgeon: Rogene Houston, MD;  [facility];  Service: Endoscopy;  Laterality: N/A;  . Esophagogastroduodenoscopy N/A 02/12/2015    Procedure: ESOPHAGOGASTRODUODENOSCOPY (EGD);  Surgeon: Rogene Houston, MD;  [facility];  Service: Endoscopy;  Laterality: N/A;  105  . Esophageal dilation N/A 02/12/2015    Procedure: ESOPHAGEAL DILATION;  Surgeon: Rogene Houston, MD;  [facility];  Service: Endoscopy;  Laterality: N/A;  . Hiatal hernia repair  09/2007    He underwent repair of large hiatal hernia and pyloroplasty which was complicated by pyloroplasty leak and subsequent gastrojejunostomy  . Transthoracic echocardiogram   12/2013    EF 65-70%, mild LVH, grade I DD, no valvular problems.  . Nocturnal polysomnogram  05/2013    Severe periodic limb movement d/o: trial of dopamine agonist such as requip was recommended.  . Cardiovascular stress test  01/30/14    Myocardial perfusion imaging: NORMAL ([name]).    Outpatient Prescriptions Prior to Visit  Medication Sig Dispense Refill  . DULoxetine (CYMBALTA) 30 MG capsule 1-2 tabs po qd 60 capsule 0  . gabapentin (NEURONTIN) 300 MG capsule Take 300-600 mg by mouth 4 (four) times daily. 1 capsule in the morning, noon and evening, and 2 capsules at bedtime.    Marland Kitchen ibuprofen (ADVIL,MOTRIN) 200 MG tablet Take 400 mg by mouth 2 (two) times daily as needed.    . loperamide (IMODIUM) 2 MG capsule Take 2 mg by mouth as needed for diarrhea or loose stools.    Marland Kitchen loratadine (CLARITIN) 10 MG tablet Take 10 mg by mouth daily.     Marland Kitchen LORazepam (ATIVAN) 1 MG tablet Take 1 tablet (1 mg total) by mouth 2 (two) times daily. 60 tablet 2  . Multiple Vitamin (MULTIVITAMIN WITH MINERALS) TABS tablet Take 1 tablet by mouth daily.    Marland Kitchen omeprazole (PRILOSEC) 40 MG capsule Take 1 capsule (40 mg total) by mouth daily before breakfast. (Patient taking differently: Take 40 mg by mouth as needed. ) 30 capsule 5  . ranitidine (ZANTAC) 150 MG tablet Take 1 tablet (150 mg total) by mouth at bedtime. (Patient taking differently: Take 150 mg by mouth 2 (two) times daily. ) 60 tablet 5  . traMADol (ULTRAM) 50 MG tablet TAKE ONE OR TWO TABLETS UP TO THREE TIMES DAILY AS NEEDED FOR PAIN. 60 tablet 1   No facility-administered medications prior to visit.    Allergies  Allergen Reactions  . Acetaminophen Other (See Comments)    Liver issues    ROS As per HPI  PE: Blood pressure 176/123, pulse 104, temperature 97.6 F (36.4 C), temperature source Oral, resp. rate 16, height 6' (1.829 m), weight 171 lb 8 oz (77.792 kg), SpO2 93 %. repeat bp 170/112 VS: noted--normal. Gen: alert, NAD, NONTOXIC  APPEARING. HEENT: eyes without injection, drainage, or swelling.  Ears: EACs clear, TMs with normal light reflex and landmarks.  Nose: Clear rhinorrhea, with some dried, crusty exudate adherent to mildly injected mucosa.  No purulent d/c.  moderate L>R paranasal sinus TTP.  No facial swelling.  Throat and mouth without focal lesion.  No pharyngial swelling, erythema, or exudate.   Neck: supple, no LAD.   LUNGS: CTA bilat, nonlabored resps.   CV: RRR, no m/r/g. EXT: no c/c/e SKIN: no rash  LABS:    Chemistry      Component Value Date/Time   NA 135 12/30/2014 1559   K 4.4 12/30/2014 1559   CL 99 12/30/2014 1559  CO2 23 12/30/2014 1559   BUN 7 12/30/2014 1559   CREATININE 1.10 12/30/2014 1559   CREATININE 1.10 03/31/2014 1528      Component Value Date/Time   CALCIUM 9.1 12/30/2014 1559   ALKPHOS 81 11/23/2015 1049   AST 55* 11/23/2015 1049   ALT 59* 11/23/2015 1049   BILITOT 0.6 11/23/2015 1049     Lab Results  Component Value Date   WBC 5.6 11/23/2015   HGB 15.2 11/23/2015   HCT 43.3 11/23/2015   MCV 91.4 11/23/2015   PLT 195 11/23/2015   IMPRESSION AND PLAN:  1) Essential HTN: he has had this treated in the past but somehow ended up off meds. Will start trial of irbesartan 150mg  qd.  Encouraged pt to totally quit smoking and continue to gradually cut back on alcohol until he is completely dry.  2) Acute sinusitis, with some acute bronchitis as well (no sign of RAD). Amoxil 875mg  bid x 10d.  Prednisone 40mg  qd x 5d.  3) Depression and anxiety: continue duloxetine 60mg  qd.  He declines counseling.  4) Chronic low back pain: has initial consult with Dr. Nelva Bush at [facility] ortho tomorrow per pt's report today.  5) Alcoholism: he is working on cutting back slowly but is reluctant to completely quit.  An After Visit Summary was printed and given to the patient.  FOLLOW UP: 2 wks-f/u HTN  Signed:  Crissie Sickles, MD           12/13/2015

## 2015-12-13 NOTE — Progress Notes (Signed)
Pre visit review using our clinic review tool, if applicable. No additional management support is needed unless otherwise documented below in the visit note. 

## 2015-12-20 ENCOUNTER — Ambulatory Visit: Payer: Self-pay | Admitting: Family Medicine

## 2015-12-27 ENCOUNTER — Ambulatory Visit: Payer: Self-pay | Admitting: Family Medicine

## 2015-12-27 DIAGNOSIS — Z0289 Encounter for other administrative examinations: Secondary | ICD-10-CM

## 2015-12-30 ENCOUNTER — Ambulatory Visit (INDEPENDENT_AMBULATORY_CARE_PROVIDER_SITE_OTHER): Payer: Medicare Other | Admitting: Internal Medicine

## 2015-12-30 ENCOUNTER — Encounter (INDEPENDENT_AMBULATORY_CARE_PROVIDER_SITE_OTHER): Payer: Self-pay | Admitting: *Deleted

## 2015-12-30 ENCOUNTER — Encounter (INDEPENDENT_AMBULATORY_CARE_PROVIDER_SITE_OTHER): Payer: Self-pay | Admitting: Internal Medicine

## 2015-12-30 VITALS — BP 140/86 | HR 84 | Temp 97.8°F | Ht 72.0 in | Wt 171.6 lb

## 2015-12-30 DIAGNOSIS — K219 Gastro-esophageal reflux disease without esophagitis: Secondary | ICD-10-CM | POA: Diagnosis not present

## 2015-12-30 DIAGNOSIS — R110 Nausea: Secondary | ICD-10-CM

## 2015-12-30 DIAGNOSIS — R1319 Other dysphagia: Secondary | ICD-10-CM | POA: Diagnosis not present

## 2015-12-30 MED ORDER — ONDANSETRON HCL 4 MG PO TABS: 4.0000 mg | ORAL_TABLET | Freq: Three times a day (TID) | ORAL | Status: DC | PRN

## 2015-12-30 NOTE — Progress Notes (Addendum)
Subjective:    Patient ID: Gary Vasquez, male    DOB: [birth year], 65 y.o.   MRN: [id number]  HPI Here today with c/o that he hasn't been able to eat for a week. He has had the flu 2 weeks ago. He feels like he has flu. He had a fever during this episode. He hasn't been able to sleep. His last weight in March was 171.6. Today his weight is 177. Today he feels better. He says he can't eat. He continues to have chronic nausea. He drinks a couple of beers a day. He smokes 4-5 cigarettes a day. He has a BM 6-8 times. Stools are usually not formed.  No change in his stool.  He c/o epigastric pain from ? Vomiting.  He says sometimes foods are lodging. He avoids chicken and bread and dry foods. Hx of dysphagia and has underwent EGD/ED in the past     10/30/2014 EGD with ED  Indications: Patient is 65 year old occasion male with complicated GI history including chronic GERD who presents with solid food dysphagia of several months duration.  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 Diffuse gastritis with duodenal gastric bile reflux. Wide open Billroth II anastomosis without ulceration. Normal mucosa of efferent and afferent loop.   Hepatic Function Latest Ref Rng 11/23/2015 05/25/2015 12/30/2014  Total Protein 6.1 - 8.1 g/dL 7.9 7.4 7.4  Albumin 3.6 - 5.1 g/dL 3.9 4.1 4.0  AST 10 - 35 U/L 55(H) 32 51(H)  ALT 9 - 46 U/L 59(H) 24 37  Alk Phosphatase 40 - 115 U/L 81 81 68  Total Bilirubin 0.2 - 1.2 mg/dL 0.6 0.5 0.6  Bilirubin, Direct <=0.2 mg/dL 0.2 0.2 -   CBC    Component Value Date/Time   WBC 5.6 11/23/2015 1049   RBC 4.74 11/23/2015 1049   HGB 15.2 11/23/2015 1049   HCT 43.3 11/23/2015 1049   PLT 195 11/23/2015 1049   MCV 91.4 11/23/2015 1049   MCH 32.1 11/23/2015 1049   MCHC 35.1 11/23/2015 1049   RDW 14.1 11/23/2015 1049   LYMPHSABS 2.4 11/23/2015 1049     MONOABS 0.4 11/23/2015 1049   EOSABS 0.1 11/23/2015 1049   BASOSABS 0.1 11/23/2015 1049       Review of Systems Past Medical History  Diagnosis Date  . Crohn disease ([place])     in remission since approx 1995  . GERD (gastroesophageal reflux disease)   . Essential hypertension, benign   . Alcoholic hepatitis   . Arthritis   . Dumping syndrome   . Paraesophageal hernia   . Chronic diarrhea   . Subdural hematoma ([place])   . Gastroparesis   . Migraine headache   . Chronic back pain   . TIA (transient ischemic attack)   . DDD (degenerative disc disease), lumbar     L > R lumbar radiculopathy  . Alcoholism ([place])     Still drinking as of 11/12/15  . Anxiety and depression   . Allergy   . History of liver failure     (alchoholic) Recovered in hosp  . Aspiration pneumonitis (HCC)     recurrent  . Alcoholic pancreatitis   . Bile reflux esophagitis     Chronic; due to gastrojejunostomy.  He was evaluated by Dr. Demetrio Lapping of [facility] last year for Roux-en-Y procedure. He decided not to proceed with surgery because of high risk. Metoclopramide resulted in diarrhea and dominant and domperodone was ineffective.   . DDD (degenerative disc disease), cervical 02/2013  MRI by Dr. Joneen Caraway  . Encephalomalacia on imaging study 02/2013    MRI brain w/o contrast: Mild progression of left occipital lobe encephalomalacia and  . Fatty liver 02/2013    Abd u/s   . Asymptomatic gallstones 2014    one stone    Past Surgical History  Procedure Laterality Date  . Nissen fundoplication    . Colon resection Right     right colon resection and ileocolonic anastamosis  . Pyloroplasty  2009  . Colonoscopy N/A 07/02/2013    Procedure: COLONOSCOPY;  Surgeon: Rogene Houston, MD;  [facility];  Service: Endoscopy;  Laterality: N/A;  200  . Esophagogastroduodenoscopy N/A 08/14/2013    Procedure: ESOPHAGOGASTRODUODENOSCOPY (EGD);  Surgeon: Rogene Houston, MD;  [facility];   Service: Endoscopy;  Laterality: N/A;  300  . Esophagogastroduodenoscopy N/A 10/30/2014    Procedure: ESOPHAGOGASTRODUODENOSCOPY (EGD);  Surgeon: Rogene Houston, MD;  [facility];  Service: Endoscopy;  Laterality: N/A;  940  . Esophageal dilation N/A 10/30/2014    Procedure: ESOPHAGEAL DILATION;  Surgeon: Rogene Houston, MD;  [facility];  Service: Endoscopy;  Laterality: N/A;  . Esophagogastroduodenoscopy N/A 02/12/2015    Procedure: ESOPHAGOGASTRODUODENOSCOPY (EGD);  Surgeon: Rogene Houston, MD;  [facility];  Service: Endoscopy;  Laterality: N/A;  105  . Esophageal dilation N/A 02/12/2015    Procedure: ESOPHAGEAL DILATION;  Surgeon: Rogene Houston, MD;  [facility];  Service: Endoscopy;  Laterality: N/A;  . Hiatal hernia repair  09/2007    He underwent repair of large hiatal hernia and pyloroplasty which was complicated by pyloroplasty leak and subsequent gastrojejunostomy  . Transthoracic echocardiogram  12/2013    EF 65-70%, mild LVH, grade I DD, no valvular problems.  . Nocturnal polysomnogram  05/2013    Severe periodic limb movement d/o: trial of dopamine agonist such as requip was recommended.  . Cardiovascular stress test  01/30/14    Myocardial perfusion imaging: NORMAL ([name]).    Allergies  Allergen Reactions  . Acetaminophen Other (See Comments)    Liver issues  . Norvasc [Amlodipine] Other (See Comments)    "felt bad"    Current Outpatient Prescriptions on File Prior to Visit  Medication Sig Dispense Refill  . DULoxetine (CYMBALTA) 30 MG capsule 1-2 tabs po qd 60 capsule 0  . gabapentin (NEURONTIN) 300 MG capsule Take 300-600 mg by mouth 4 (four) times daily. 1 capsule in the morning, noon and evening, and 2 capsules at bedtime.    Marland Kitchen ibuprofen (ADVIL,MOTRIN) 200 MG tablet Take 400 mg by mouth 2 (two) times daily as needed.    . irbesartan (AVAPRO) 150 MG tablet Take 1 tablet (150 mg total) by mouth daily. 30 tablet 0  .  loperamide (IMODIUM) 2 MG capsule Take 2 mg by mouth as needed for diarrhea or loose stools.    Marland Kitchen loratadine (CLARITIN) 10 MG tablet Take 10 mg by mouth daily.     Marland Kitchen LORazepam (ATIVAN) 1 MG tablet Take 1 tablet (1 mg total) by mouth 2 (two) times daily. 60 tablet 2  . Multiple Vitamin (MULTIVITAMIN WITH MINERALS) TABS tablet Take 1 tablet by mouth daily.    Marland Kitchen omeprazole (PRILOSEC) 40 MG capsule Take 1 capsule (40 mg total) by mouth daily before breakfast. (Patient taking differently: Take 40 mg by mouth as needed. ) 30 capsule 5  . ranitidine (ZANTAC) 150 MG tablet Take 1 tablet (150 mg total) by  mouth at bedtime. (Patient taking differently: Take 150 mg by mouth 2 (two) times daily. ) 60 tablet 5  . traMADol (ULTRAM) 50 MG tablet TAKE ONE OR TWO TABLETS UP TO THREE TIMES DAILY AS NEEDED FOR PAIN. 60 tablet 1   No current facility-administered medications on file prior to visit.        Objective:   Physical Exam Blood pressure 140/86, pulse 84, temperature 97.8 F (36.6 C), height 6' (1.829 m), weight 171 lb 9.6 oz (77.837 kg). Alert and oriented. Skin warm and dry. Oral mucosa is moist.   . Sclera anicteric, conjunctivae is pink. Thyroid not enlarged. No cervical lymphadenopathy. Lungs clear. Heart regular rate and rhythm.  Abdomen is soft. Bowel sounds are positive. No hepatomegaly. No abdominal masses felt. No tenderness.  No edema to lower extremities.          Assessment & Plan:    . 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  He was evaluated by Dr. Demetrio Lapping of [facility] last year for Roux-en-Y procedure. He decided not to proceed with surgery because of high risk. Patient continues to drink etoh.  Rx for Zofran sent to his pharmacy.  . Chronic diarrhea felt to be due to altered GI tract. He has history of Crohn's disease but he has been in remission for well over 20 years.  GERD. Heartburn is well controlled with therapy. Marland Kitchen History of  alcoholic hepatitis. Transaminases are slightly elevated.    . Chronic epigastric pain.  Marland KitchenDysphagia: DG esophagram.

## 2015-12-30 NOTE — Patient Instructions (Signed)
Continue the Prilosec daily and Zantac at night. DG esophagram

## 2016-01-04 ENCOUNTER — Ambulatory Visit (HOSPITAL_COMMUNITY)
Admission: RE | Admit: 2016-01-04 | Discharge: 2016-01-04 | Disposition: A | Discharge status: Home / Self Care | Payer: Medicare Other | Source: Ambulatory Visit | Attending: Internal Medicine | Admitting: Internal Medicine

## 2016-01-04 ENCOUNTER — Other Ambulatory Visit (INDEPENDENT_AMBULATORY_CARE_PROVIDER_SITE_OTHER): Payer: Self-pay | Admitting: Internal Medicine

## 2016-01-04 DIAGNOSIS — R1310 Dysphagia, unspecified: Secondary | ICD-10-CM | POA: Diagnosis not present

## 2016-01-04 DIAGNOSIS — R1319 Other dysphagia: Secondary | ICD-10-CM | POA: Insufficient documentation

## 2016-01-05 ENCOUNTER — Other Ambulatory Visit (INDEPENDENT_AMBULATORY_CARE_PROVIDER_SITE_OTHER): Payer: Self-pay | Admitting: Internal Medicine

## 2016-01-05 DIAGNOSIS — R1310 Dysphagia, unspecified: Secondary | ICD-10-CM

## 2016-01-10 ENCOUNTER — Telehealth (INDEPENDENT_AMBULATORY_CARE_PROVIDER_SITE_OTHER): Payer: Self-pay | Admitting: *Deleted

## 2016-01-10 NOTE — Telephone Encounter (Signed)
Patient is scheduled for EGD/ED 6/9 -- this will be the 3rd or 4th time, he wants to know if you can put a stent in where you are having to dilate so it will stay open. If you can do this, would he still have problems with reflux.   Ph# [phone] or [phone]

## 2016-01-13 NOTE — Telephone Encounter (Signed)
Patient's call returned. Pros and cons of esophageal stenting for benign disease discussed with patient. Patient is now able to swallow liquids only. He continues to consume alcohol. Will try to schedule patient earlier than planned.

## 2016-01-14 NOTE — Telephone Encounter (Signed)
Patient aware I will call if we get a cancellation

## 2016-01-21 ENCOUNTER — Encounter (HOSPITAL_COMMUNITY)
Admission: RE | Disposition: A | Discharge status: Home / Self Care | Payer: Self-pay | Source: Ambulatory Visit | Attending: Internal Medicine

## 2016-01-21 ENCOUNTER — Encounter (HOSPITAL_COMMUNITY): Payer: Self-pay | Admitting: *Deleted

## 2016-01-21 ENCOUNTER — Ambulatory Visit (HOSPITAL_COMMUNITY)
Admission: RE | Admit: 2016-01-21 | Discharge: 2016-01-21 | Disposition: A | Discharge status: Home / Self Care | Payer: Medicare Other | Source: Ambulatory Visit | Attending: Internal Medicine | Admitting: Internal Medicine

## 2016-01-21 DIAGNOSIS — M549 Dorsalgia, unspecified: Secondary | ICD-10-CM | POA: Insufficient documentation

## 2016-01-21 DIAGNOSIS — M5136 Other intervertebral disc degeneration, lumbar region: Secondary | ICD-10-CM | POA: Diagnosis not present

## 2016-01-21 DIAGNOSIS — F17210 Nicotine dependence, cigarettes, uncomplicated: Secondary | ICD-10-CM | POA: Insufficient documentation

## 2016-01-21 DIAGNOSIS — K2950 Unspecified chronic gastritis without bleeding: Secondary | ICD-10-CM | POA: Diagnosis not present

## 2016-01-21 DIAGNOSIS — F329 Major depressive disorder, single episode, unspecified: Secondary | ICD-10-CM | POA: Diagnosis not present

## 2016-01-21 DIAGNOSIS — G43909 Migraine, unspecified, not intractable, without status migrainosus: Secondary | ICD-10-CM | POA: Insufficient documentation

## 2016-01-21 DIAGNOSIS — K210 Gastro-esophageal reflux disease with esophagitis: Secondary | ICD-10-CM | POA: Insufficient documentation

## 2016-01-21 DIAGNOSIS — R1319 Other dysphagia: Secondary | ICD-10-CM | POA: Diagnosis present

## 2016-01-21 DIAGNOSIS — I10 Essential (primary) hypertension: Secondary | ICD-10-CM | POA: Diagnosis not present

## 2016-01-21 DIAGNOSIS — Z886 Allergy status to analgesic agent status: Secondary | ICD-10-CM | POA: Diagnosis not present

## 2016-01-21 DIAGNOSIS — Z8673 Personal history of transient ischemic attack (TIA), and cerebral infarction without residual deficits: Secondary | ICD-10-CM | POA: Insufficient documentation

## 2016-01-21 DIAGNOSIS — K222 Esophageal obstruction: Secondary | ICD-10-CM | POA: Insufficient documentation

## 2016-01-21 DIAGNOSIS — K3189 Other diseases of stomach and duodenum: Secondary | ICD-10-CM | POA: Insufficient documentation

## 2016-01-21 DIAGNOSIS — K449 Diaphragmatic hernia without obstruction or gangrene: Secondary | ICD-10-CM | POA: Diagnosis not present

## 2016-01-21 DIAGNOSIS — Z934 Other artificial openings of gastrointestinal tract status: Secondary | ICD-10-CM | POA: Insufficient documentation

## 2016-01-21 DIAGNOSIS — K760 Fatty (change of) liver, not elsewhere classified: Secondary | ICD-10-CM | POA: Diagnosis not present

## 2016-01-21 DIAGNOSIS — G8929 Other chronic pain: Secondary | ICD-10-CM | POA: Diagnosis not present

## 2016-01-21 DIAGNOSIS — R1310 Dysphagia, unspecified: Secondary | ICD-10-CM

## 2016-01-21 DIAGNOSIS — K2960 Other gastritis without bleeding: Secondary | ICD-10-CM | POA: Insufficient documentation

## 2016-01-21 DIAGNOSIS — Z8719 Personal history of other diseases of the digestive system: Secondary | ICD-10-CM | POA: Insufficient documentation

## 2016-01-21 DIAGNOSIS — G9389 Other specified disorders of brain: Secondary | ICD-10-CM | POA: Diagnosis not present

## 2016-01-21 DIAGNOSIS — F419 Anxiety disorder, unspecified: Secondary | ICD-10-CM | POA: Insufficient documentation

## 2016-01-21 DIAGNOSIS — Z888 Allergy status to other drugs, medicaments and biological substances status: Secondary | ICD-10-CM | POA: Diagnosis not present

## 2016-01-21 DIAGNOSIS — Z980 Intestinal bypass and anastomosis status: Secondary | ICD-10-CM | POA: Diagnosis not present

## 2016-01-21 HISTORY — PX: ESOPHAGEAL DILATION: SHX303

## 2016-01-21 HISTORY — PX: ESOPHAGOGASTRODUODENOSCOPY: SHX1529

## 2016-01-21 HISTORY — PX: ESOPHAGOGASTRODUODENOSCOPY: SHX5428

## 2016-01-21 SURGERY — EGD (ESOPHAGOGASTRODUODENOSCOPY)
Anesthesia: Moderate Sedation

## 2016-01-21 MED ORDER — MEPERIDINE HCL 50 MG/ML IJ SOLN
INTRAMUSCULAR | Status: AC
  Filled 2016-01-21: qty 1

## 2016-01-21 MED ORDER — MIDAZOLAM HCL 5 MG/5ML IJ SOLN
INTRAMUSCULAR | Status: AC
  Filled 2016-01-21: qty 10

## 2016-01-21 MED ORDER — SODIUM CHLORIDE 0.9% FLUSH
INTRAVENOUS | Status: AC
  Filled 2016-01-21: qty 10

## 2016-01-21 MED ORDER — STERILE WATER FOR IRRIGATION IR SOLN
Status: DC | PRN
  Administered 2016-01-21: 09:00:00

## 2016-01-21 MED ORDER — MIDAZOLAM HCL 5 MG/5ML IJ SOLN
INTRAMUSCULAR | Status: DC | PRN
  Administered 2016-01-21: 3 mg via INTRAVENOUS
  Administered 2016-01-21 (×2): 2 mg via INTRAVENOUS

## 2016-01-21 MED ORDER — MEPERIDINE HCL 50 MG/ML IJ SOLN
INTRAMUSCULAR | Status: DC | PRN
  Administered 2016-01-21 (×2): 25 mg via INTRAVENOUS

## 2016-01-21 MED ORDER — BUTAMBEN-TETRACAINE-BENZOCAINE 2-2-14 % EX AERO
INHALATION_SPRAY | CUTANEOUS | Status: DC | PRN
  Administered 2016-01-21: 2 via TOPICAL

## 2016-01-21 MED ORDER — SODIUM CHLORIDE 0.9 % IV SOLN: INTRAVENOUS | Status: DC

## 2016-01-21 MED ORDER — PROMETHAZINE HCL 25 MG/ML IJ SOLN
INTRAMUSCULAR | Status: AC
  Administered 2016-01-21: 12.5 mg
  Filled 2016-01-21: qty 1

## 2016-01-21 NOTE — Op Note (Signed)
[facility] Patient Name: Gary Vasquez Procedure Date: 01/21/2016 8:23 AM MRN: [id number] Date of Birth: [birth year] Attending MD: Hildred Laser , MD CSN: [id number] Age: 65 Admit Type: Outpatient Procedure:                Upper GI endoscopy Indications:              Esophageal dysphagia, Reflux esophagitis Providers:                Hildred Laser, MD, Gwenlyn Fudge, RN, Isabella Stalling, Technician Referring MD:             Tammi Sou, MD Medicines:                Promethazine 12.5 mg IV, Cetacaine spray,                            Meperidine 50 mg IV, Midazolam 7 mg IV Complications:            No immediate complications. Estimated Blood Loss:     Estimated blood loss was minimal. Procedure:                Pre-Anesthesia Assessment:                           - Prior to the procedure, a History and Physical                            was performed, and patient medications and                            allergies were reviewed. The patient's tolerance of                            previous anesthesia was also reviewed. The risks                            and benefits of the procedure and the sedation                            options and risks were discussed with the patient.                            All questions were answered, and informed consent                            was obtained. Prior Anticoagulants: The patient                            last took ibuprofen 7 days prior to the procedure.                            ASA Grade Assessment: III - A patient with severe  systemic disease. After reviewing the risks and                            benefits, the patient was deemed in satisfactory                            condition to undergo the procedure.                           After obtaining informed consent, the endoscope was                            passed under direct vision. Throughout the               procedure, the patient's blood pressure, pulse, and                            oxygen saturations were monitored continuously. The                            EG-[id number] ([id number]) scope was introduced through the                            mouth, and advanced to the proximal jejunum. The                            upper GI endoscopy was accomplished without                            difficulty. The patient tolerated the procedure                            well. Scope In: 8:39:22 AM Scope Out: 8:51:42 AM Total Procedure Duration: 0 hours 12 minutes 20 seconds  Findings:      The upper third of the esophagus and middle third of the esophagus were       normal.      LA Grade B (one or more mucosal breaks greater than 5 mm, not extending       between the tops of two mucosal folds) esophagitis with no bleeding was       found.      One moderate benign-appearing, intrinsic stenosis was found 45 cm from       the incisors. This measured 1.1 cm (inner diameter) x less than one cm       (in length) and was traversed. A TTS dilator was passed through the       scope. Dilation with an 18-19-20 mm balloon dilator was performed to 18       mm. The dilation site was examined and showed complete resolution of       luminal narrowing.      Evidence of a patent Billroth II gastrojejunostomy was found. The       gastrojejunal anastomosis was characterized by congestion, edema and       erosion. This was traversed. The efferent limb was examined 30 cm from       anastomosis and was characterized by healthy appearing mucosa.  The       afferent limb was examined 10 cm from anastomosis and was characterized       by healthy appearing mucosa.      Diffuse moderate inflammation characterized by congestion (edema),       erosions, erythema and friability was found in the entire examined       stomach. Biopsies were taken with a cold forceps for histology. Impression:               - Normal upper  third of esophagus and middle third                            of esophagus.                           - LA Grade B reflux and chronic esophagitis.                           - Benign-appearing esophageal stenosis. Dilated.                           - Patent Billroth II gastrojejunostomy was found,                            characterized by congestion, edema and erosion.                           - Chronic gastritis. Biopsied. Moderate Sedation:      Moderate (conscious) sedation was administered by the endoscopy nurse       and supervised by the endoscopist. The following parameters were       monitored: oxygen saturation, heart rate, blood pressure, CO2       capnography and response to care. Total physician intraservice time was       19 minutes. Recommendation:           - Patient has a contact number available for                            emergencies. The signs and symptoms of potential                            delayed complications were discussed with the                            patient. Return to normal activities tomorrow.                            Written discharge instructions were provided to the                            patient.                           - Resume previous diet today.                           - Continue present medications.                           -  Await pathology results.                           - Return to GI clinic in 6 months. Procedure Code(s):        --- Professional ---                           [phone], Esophagogastroduodenoscopy, flexible,                            transoral; with transendoscopic balloon dilation of                            esophagus (less than 30 mm diameter)                           43239, Esophagogastroduodenoscopy, flexible,                            transoral; with biopsy, single or multiple                           99152, Moderate sedation services provided by the                            same physician or  other qualified health care                            professional performing the diagnostic or                            therapeutic service that the sedation supports,                            requiring the presence of an independent trained                            observer to assist in the monitoring of the                            patient's level of consciousness and physiological                            status; initial 15 minutes of intraservice time,                            patient age 53 years or older Diagnosis Code(s):        --- Professional ---                           K21.0, Gastro-esophageal reflux disease with                            esophagitis  K22.2, Esophageal obstruction                           Z98.0, Intestinal bypass and anastomosis status                           K29.50, Unspecified chronic gastritis without                            bleeding                           R13.14, Dysphagia, pharyngoesophageal phase CPT copyright 2016 American Medical Association. All rights reserved. The codes documented in this report are preliminary and upon coder review may  be revised to meet current compliance requirements. Hildred Laser, MD Hildred Laser, MD 01/21/2016 9:13:02 AM This report has been signed electronically. Number of Addenda: 0

## 2016-01-21 NOTE — H&P (Signed)
Gary Vasquez is an 65 y.o. male.   Chief Complaint: Patient is here for EGD and ED. HPI: Patient is 65 year old Caucasian male with complicated GI history who presents with progressive solid food dysphagia. He has history of stricture at GE junction and has been dilated multiple times. More recently dilated in June 2016 to 19 mm. He remains with postprandial nausea and vomiting which occurs at least 3-4 times a week if not daily. He vomits bile and a food debris. He denies hematemesis. He is maintaining his weight. He has been evaluated at [facility] to convert B2 anastomosis into the Roux-en-Y but felt to be high risk for surgery.  Past Medical History  Diagnosis Date  . Crohn disease ([place])     in remission since approx 1995  . GERD (gastroesophageal reflux disease)   . Essential hypertension, benign   . Alcoholic hepatitis   . Arthritis   . Dumping syndrome   . Paraesophageal hernia   . Chronic diarrhea   . Subdural hematoma ([place])   . Gastroparesis   . Migraine headache   . Chronic back pain   . TIA (transient ischemic attack)   . DDD (degenerative disc disease), lumbar     L > R lumbar radiculopathy  . Alcoholism ([place])     Still drinking as of 11/12/15  . Anxiety and depression   . Allergy   . History of liver failure     (alchoholic) Recovered in hosp  . Aspiration pneumonitis ([facility])     recurrent  . Alcoholic pancreatitis   . Bile reflux esophagitis     Chronic; due to gastrojejunostomy.  He was evaluated by Dr. Demetrio Lapping of [facility] last year for Roux-en-Y procedure. He decided not to proceed with surgery because of high risk. Metoclopramide resulted in diarrhea and dominant and domperodone was ineffective.   . DDD (degenerative disc disease), cervical 02/2013    MRI by Dr. Joneen Caraway  . Encephalomalacia on imaging study 02/2013    MRI brain w/o contrast: Mild progression of left occipital lobe encephalomalacia and  . Fatty liver 02/2013    Abd u/s   . Asymptomatic gallstones 2014     one stone    Past Surgical History  Procedure Laterality Date  . Nissen fundoplication    . Colon resection Right     right colon resection and ileocolonic anastamosis  . Pyloroplasty  2009  . Colonoscopy N/A 07/02/2013    Procedure: COLONOSCOPY;  Surgeon: Rogene Houston, MD;  [facility]: [facility];  Service: Endoscopy;  Laterality: N/A;  200  . Esophagogastroduodenoscopy N/A 08/14/2013    Procedure: ESOPHAGOGASTRODUODENOSCOPY (EGD);  Surgeon: Rogene Houston, MD;  Location: [facility];  Service: Endoscopy;  Laterality: N/A;  300  . Esophagogastroduodenoscopy N/A 10/30/2014    Procedure: ESOPHAGOGASTRODUODENOSCOPY (EGD);  Surgeon: Rogene Houston, MD;  [facility]: [facility];  Service: Endoscopy;  Laterality: N/A;  940  . Esophageal dilation N/A 10/30/2014    Procedure: ESOPHAGEAL DILATION;  Surgeon: Rogene Houston, MD;  [facility]: [facility];  Service: Endoscopy;  Laterality: N/A;  . Esophagogastroduodenoscopy N/A 02/12/2015    Procedure: ESOPHAGOGASTRODUODENOSCOPY (EGD);  Surgeon: Rogene Houston, MD;  [facility];  Service: Endoscopy;  Laterality: N/A;  105  . Esophageal dilation N/A 02/12/2015    Procedure: ESOPHAGEAL DILATION;  Surgeon: Rogene Houston, MD;  [facility]: [facility];  Service: Endoscopy;  Laterality: N/A;  . Hiatal hernia repair  09/2007    He  underwent repair of large hiatal hernia and pyloroplasty which was complicated by pyloroplasty leak and subsequent gastrojejunostomy  . Transthoracic echocardiogram  12/2013    EF 65-70%, mild LVH, grade I DD, no valvular problems.  . Nocturnal polysomnogram  05/2013    Severe periodic limb movement d/o: trial of dopamine agonist such as requip was recommended.  . Cardiovascular stress test  01/30/14    Myocardial perfusion imaging: NORMAL ([name]).    Family History  Problem Relation Age of Onset  . Healthy Daughter   . Healthy Son   . Heart attack Mother     CABG  . Stroke Mother   . Heart attack  Father     CABG  . Heart attack Brother     CABG   Social History:  reports that he has been smoking Cigarettes.  He has a 6.25 pack-year smoking history. He has never used smokeless tobacco. He reports that he drinks about 1.8 - 2.4 oz of alcohol per week. He reports that he does not use illicit drugs.  Allergies:  Allergies  Allergen Reactions  . Acetaminophen Other (See Comments)    Liver issues  . Norvasc [Amlodipine] Other (See Comments)    "felt bad"    Medications Prior to Admission  Medication Sig Dispense Refill  . DULoxetine (CYMBALTA) 30 MG capsule 1-2 tabs po qd 60 capsule 0  . gabapentin (NEURONTIN) 300 MG capsule Take 300-600 mg by mouth 4 (four) times daily. 1 capsule in the morning, noon and evening, and 2 capsules at bedtime.    Marland Kitchen ibuprofen (ADVIL,MOTRIN) 200 MG tablet Take 400 mg by mouth 2 (two) times daily as needed.    . irbesartan (AVAPRO) 150 MG tablet Take 1 tablet (150 mg total) by mouth daily. 30 tablet 0  . loperamide (IMODIUM) 2 MG capsule Take 2 mg by mouth as needed for diarrhea or loose stools.    Marland Kitchen loratadine (CLARITIN) 10 MG tablet Take 10 mg by mouth daily.     Marland Kitchen LORazepam (ATIVAN) 1 MG tablet Take 1 tablet (1 mg total) by mouth 2 (two) times daily. 60 tablet 2  . Multiple Vitamin (MULTIVITAMIN WITH MINERALS) TABS tablet Take 1 tablet by mouth daily.    Marland Kitchen omeprazole (PRILOSEC) 40 MG capsule Take 1 capsule (40 mg total) by mouth daily before breakfast. (Patient taking differently: Take 40 mg by mouth as needed. ) 30 capsule 5  . ranitidine (ZANTAC) 150 MG tablet Take 1 tablet (150 mg total) by mouth at bedtime. (Patient taking differently: Take 150 mg by mouth 2 (two) times daily. ) 60 tablet 5  . traMADol (ULTRAM) 50 MG tablet TAKE ONE OR TWO TABLETS UP TO THREE TIMES DAILY AS NEEDED FOR PAIN. 60 tablet 1  . ondansetron (ZOFRAN) 4 MG tablet Take 1 tablet (4 mg total) by mouth every 8 (eight) hours as needed for nausea or vomiting. 30 tablet 1    No  results found for this or any previous visit (from the past 48 hour(s)). No results found.  ROS  Blood pressure 119/76, pulse 67, temperature 97.8 F (36.6 C), temperature source Oral, resp. rate 13, height 6' (1.829 m), weight 170 lb (77.111 kg), SpO2 99 %. Physical Exam  Constitutional: He appears well-developed and well-nourished.  HENT:  Mouth/Throat: Oropharynx is clear and moist.  Eyes: Conjunctivae are normal. No scleral icterus.  Neck: No thyromegaly present.  Cardiovascular: Normal rate, regular rhythm and normal heart sounds.   No murmur heard. Respiratory: Effort  normal and breath sounds normal.  GI:  Abdomen is full. It is soft with mild midepigastric tenderness. No organomegaly or masses.  Musculoskeletal: He exhibits no edema.  Lymphadenopathy:    He has no cervical adenopathy.  Neurological: He is alert.  Skin: Skin is warm and dry.     Assessment/Plan Solid food dysphagia in patient with chronic GERD and history of distal esophageal stricture. Chronic nausea and vomiting. EGD with ED.  Hildred Laser, MD 01/21/2016, 8:27 AM

## 2016-01-21 NOTE — Discharge Instructions (Signed)
Resume usual medications but do not take ibuprofen or similar medications. Six small meals daily. No driving for 24 hours. Physician will call with biopsy results. Office visit in 6 months.        Esophagogastroduodenoscopy, Care After Refer to this sheet in the next few weeks. These instructions provide you with information about caring for yourself after your procedure. Your health care provider may also give you more specific instructions. Your treatment has been planned according to current medical practices, but problems sometimes occur. Call your health care provider if you have any problems or questions after your procedure. WHAT TO EXPECT AFTER THE PROCEDURE After your procedure, it is typical to feel:  Soreness in your throat.  Pain with swallowing.  Sick to your stomach (nauseous).  Bloated.  Dizzy.  Fatigued. HOME CARE INSTRUCTIONS  Do not eat or drink anything until the numbing medicine (local anesthetic) has worn off and your gag reflex has returned. You will know that the local anesthetic has worn off when you can swallow comfortably.  Do not drive or operate machinery until directed by your health care provider.  Take medicines only as directed by your health care provider. SEEK MEDICAL CARE IF:   You cannot stop coughing.  You are not urinating at all or less than usual. SEEK IMMEDIATE MEDICAL CARE IF:  You have difficulty swallowing.  You cannot eat or drink.  You have worsening throat or chest pain.  You have dizziness or lightheadedness or you faint.  You have nausea or vomiting.  You have chills.  You have a fever.  You have severe abdominal pain.  You have black, tarry, or bloody stools.   This information is not intended to replace advice given to you by your health care provider. Make sure you discuss any questions you have with your health care provider.   Document Released: 07/31/2012 Document Revised: 09/04/2014 Document  Reviewed: 07/31/2012 Elsevier Interactive Patient Education [employer].

## 2016-01-25 ENCOUNTER — Encounter: Payer: Self-pay | Admitting: Family Medicine

## 2016-01-25 ENCOUNTER — Ambulatory Visit (INDEPENDENT_AMBULATORY_CARE_PROVIDER_SITE_OTHER): Payer: Medicare Other | Admitting: Family Medicine

## 2016-01-25 VITALS — BP 120/78 | HR 81 | Temp 97.6°F | Resp 16 | Ht 72.0 in | Wt 176.0 lb

## 2016-01-25 DIAGNOSIS — E291 Testicular hypofunction: Secondary | ICD-10-CM

## 2016-01-25 DIAGNOSIS — F1010 Alcohol abuse, uncomplicated: Secondary | ICD-10-CM

## 2016-01-25 DIAGNOSIS — R6882 Decreased libido: Secondary | ICD-10-CM | POA: Diagnosis not present

## 2016-01-25 DIAGNOSIS — E538 Deficiency of other specified B group vitamins: Secondary | ICD-10-CM

## 2016-01-25 DIAGNOSIS — R7989 Other specified abnormal findings of blood chemistry: Secondary | ICD-10-CM

## 2016-01-25 DIAGNOSIS — I10 Essential (primary) hypertension: Secondary | ICD-10-CM

## 2016-01-25 DIAGNOSIS — N5201 Erectile dysfunction due to arterial insufficiency: Secondary | ICD-10-CM | POA: Diagnosis not present

## 2016-01-25 DIAGNOSIS — Z125 Encounter for screening for malignant neoplasm of prostate: Secondary | ICD-10-CM

## 2016-01-25 NOTE — Progress Notes (Signed)
OFFICE VISIT  01/25/2016   CC:  Chief Complaint  Patient presents with  . Follow-up    HTN. Pt is not fasting.     HPI:    Patient is a 65 y.o. Caucasian male who presents for f/u HTN.  Six weeks ago I restarted him on irbesartan. He says the irbesartan 150mg  made his bp bottom out.   He's been monitoring bp since that time and says: after moderate activity it is 140-150s over 90s-100s.  When at rest it is 120-130 over 70-80.  He just recently got esophagus dilated again for recurrent stricture.  Has hx of intake varying quite a bit due to upper GI symptoms.  His bp control sometimes varies along with his ability to eat.  He also started duloxetine last time I saw him: he says this med made him very mean so he stopped it after about 1 wk.  We started this to try to help with anxiety/depression and thereby take away his "need" to drink beer (says he currently drinks 3-4 beers per day).  He says right now he doesn't want to try another antidepressant.  We had a conversation about his drinking alcohol again today.  He maintains that he is fine drinking 3-4 beers per day, but he asked if I knew of any way to help him quit drinking besides "cold [place]".  I told him possibly a psychiatrist may be willing to rx a med for helping to maintain alcohol abstinence but pt was not interested in this. He says he has tried AA and he says eventually "everybody just wanted to borrow money from me", so he stopped going to this.  Fortunately, he has sold the 3 bars he owned.  Finally, he reminds me he used to take testosterone injections for his hypogonadism, and he asks about rechecking this situation (and ED) b/c he has started dating again.  He said he has impaired libido but his bigger issue is ED. He also used to get vit B12 injections for vit B12 deficiency.  Of note, he did not go to appt with Dr. Nelva Bush at [facility] ortho b/c he says he called and let them know that he did not want to be "on a bunch of  heavy meds".  After telling them this, they apparently switched him to another provider, and he did not arrange an appt yet b/c he wanted to ask me if that was ok.  Past Medical History  Diagnosis Date  . Crohn disease ([place])     in remission since approx 1995  . GERD (gastroesophageal reflux disease)   . Essential hypertension, benign   . Alcoholic hepatitis   . Arthritis   . Dumping syndrome   . Paraesophageal hernia   . Chronic diarrhea   . Subdural hematoma ([place])   . Gastroparesis   . Migraine headache   . Chronic back pain   . TIA (transient ischemic attack)   . DDD (degenerative disc disease), lumbar     L > R lumbar radiculopathy  . Alcoholism ([place])     Still drinking as of 11/12/15  . Anxiety and depression   . Allergy   . History of liver failure     (alchoholic) Recovered in hosp  . Aspiration pneumonitis ([facility])     recurrent  . Alcoholic pancreatitis   . Bile reflux esophagitis     Chronic; due to gastrojejunostomy.  He was evaluated by Dr. Demetrio Lapping of [facility] last year for Roux-en-Y procedure. He  decided not to proceed with surgery because of high risk. Metoclopramide resulted in diarrhea and dominant and domperodone was ineffective.   . DDD (degenerative disc disease), cervical 02/2013    MRI by Dr. Joneen Caraway  . Encephalomalacia on imaging study 02/2013    MRI brain w/o contrast: Mild progression of left occipital lobe encephalomalacia and  . Fatty liver 02/2013    Abd u/s   . Asymptomatic gallstones 2014    one stone    Past Surgical History  Procedure Laterality Date  . Nissen fundoplication    . Colon resection Right     right colon resection and ileocolonic anastamosis  . Pyloroplasty  2009  . Colonoscopy N/A 07/02/2013    Procedure: COLONOSCOPY;  Surgeon: Rogene Houston, MD;  [facility];  Service: Endoscopy;  Laterality: N/A;  200  . Esophagogastroduodenoscopy N/A 08/14/2013    Procedure: ESOPHAGOGASTRODUODENOSCOPY (EGD);  Surgeon: Rogene Houston, MD;  [facility];  Service: Endoscopy;  Laterality: N/A;  300  . Esophagogastroduodenoscopy N/A 10/30/2014    Procedure: ESOPHAGOGASTRODUODENOSCOPY (EGD);  Surgeon: Rogene Houston, MD;  [facility];  Service: Endoscopy;  Laterality: N/A;  940  . Esophageal dilation N/A 10/30/2014    Procedure: ESOPHAGEAL DILATION;  Surgeon: Rogene Houston, MD;  [facility];  Service: Endoscopy;  Laterality: N/A;  . Esophagogastroduodenoscopy N/A 02/12/2015    Procedure: ESOPHAGOGASTRODUODENOSCOPY (EGD);  Surgeon: Rogene Houston, MD;  [facility];  Service: Endoscopy;  Laterality: N/A;  105  . Esophageal dilation N/A 02/12/2015    Procedure: ESOPHAGEAL DILATION;  Surgeon: Rogene Houston, MD;  [facility];  Service: Endoscopy;  Laterality: N/A;  . Hiatal hernia repair  09/2007    He underwent repair of large hiatal hernia and pyloroplasty which was complicated by pyloroplasty leak and subsequent gastrojejunostomy  . Transthoracic echocardiogram  12/2013    EF 65-70%, mild LVH, grade I DD, no valvular problems.  . Nocturnal polysomnogram  05/2013    Severe periodic limb movement d/o: trial of dopamine agonist such as requip was recommended.  . Cardiovascular stress test  01/30/14    Myocardial perfusion imaging: NORMAL ([name]).    Outpatient Prescriptions Prior to Visit  Medication Sig Dispense Refill  . gabapentin (NEURONTIN) 300 MG capsule Take 300-600 mg by mouth 4 (four) times daily. 1 capsule in the morning, noon and evening, and 2 capsules at bedtime.    Marland Kitchen loperamide (IMODIUM) 2 MG capsule Take 2 mg by mouth as needed for diarrhea or loose stools.    Marland Kitchen loratadine (CLARITIN) 10 MG tablet Take 10 mg by mouth daily.     Marland Kitchen LORazepam (ATIVAN) 1 MG tablet Take 1 tablet (1 mg total) by mouth 2 (two) times daily. 60 tablet 2  . Multiple Vitamin (MULTIVITAMIN WITH MINERALS) TABS tablet Take 1 tablet by mouth daily.    Marland Kitchen omeprazole (PRILOSEC) 40  MG capsule Take 1 capsule (40 mg total) by mouth daily before breakfast. (Patient taking differently: Take 40 mg by mouth as needed. ) 30 capsule 5  . ondansetron (ZOFRAN) 4 MG tablet Take 1 tablet (4 mg total) by mouth every 8 (eight) hours as needed for nausea or vomiting. 30 tablet 1  . ranitidine (ZANTAC) 150 MG tablet Take 1 tablet (150 mg total) by mouth at bedtime. (Patient taking differently: Take 150 mg by mouth 2 (two) times daily. ) 60 tablet 5  . traMADol (ULTRAM) 50 MG tablet  TAKE ONE OR TWO TABLETS UP TO THREE TIMES DAILY AS NEEDED FOR PAIN. 60 tablet 1  . DULoxetine (CYMBALTA) 30 MG capsule 1-2 tabs po qd (Patient not taking: Reported on 01/25/2016) 60 capsule 0  . irbesartan (AVAPRO) 150 MG tablet Take 1 tablet (150 mg total) by mouth daily. (Patient not taking: Reported on 01/25/2016) 30 tablet 0   No facility-administered medications prior to visit.    Allergies  Allergen Reactions  . Acetaminophen Other (See Comments)    Liver issues  . Norvasc [Amlodipine] Other (See Comments)    "felt bad"    ROS As per HPI  PE: Blood pressure 120/78, pulse 81, temperature 97.6 F (36.4 C), temperature source Oral, resp. rate 16, height 6' (1.829 m), weight 176 lb (79.833 kg), SpO2 97 %. Gen: Alert, well appearing.  Patient is oriented to person, place, time, and situation. AFFECT: pleasant, lucid thought and speech. No further exam today.  LABS:  Lab Results  Component Value Date   TSH 2.990 01/23/2014   Lab Results  Component Value Date   WBC 5.6 11/23/2015   HGB 15.2 11/23/2015   HCT 43.3 11/23/2015   MCV 91.4 11/23/2015   PLT 195 11/23/2015   Lab Results  Component Value Date   CREATININE 1.10 12/30/2014   BUN 7 12/30/2014   NA 135 12/30/2014   K 4.4 12/30/2014   CL 99 12/30/2014   CO2 23 12/30/2014   Lab Results  Component Value Date   ALT 59* 11/23/2015   AST 55* 11/23/2015   ALKPHOS 81 11/23/2015   BILITOT 0.6 11/23/2015   Lab Results  Component  Value Date   CHOL 165 01/24/2014   Lab Results  Component Value Date   HDL 48 01/24/2014   Lab Results  Component Value Date   LDLCALC 90 01/24/2014   Lab Results  Component Value Date   TRIG 135 01/24/2014   Lab Results  Component Value Date   CHOLHDL 3.4 01/24/2014   IMPRESSION AND PLAN:  1) HTN: monitor for now on no meds.  If bp at rest is persistently elevated in the future then would get him on clonidine patch to avoid any potential GI issues.  2) Alcohol abuse: at this point he chooses to continue drinking 3-4 beers a day. I encouraged him to quit completely.  He declined referral to psychiatrist for further evaluation and management.  3) Chronic low back pain: he will call [place] [facility] back and set up appt.    4) Hx of hypogonadism: check testosterone labs. If low, repeat early morning level with additional labs.  5) Hx of vit B12 def: check vit B12.  He was on IM replacement regularly years ago but nothing recently.  An After Visit Summary was printed and given to the patient.  FOLLOW UP: Return in about 3 months (around 04/26/2016) for routine chronic illness f/u (30 min).  Signed:  Crissie Sickles, MD           01/25/2016

## 2016-01-25 NOTE — Progress Notes (Signed)
Pre visit review using our clinic review tool, if applicable. No additional management support is needed unless otherwise documented below in the visit note. 

## 2016-01-26 ENCOUNTER — Encounter (HOSPITAL_COMMUNITY): Payer: Self-pay | Admitting: Family Medicine

## 2016-01-28 ENCOUNTER — Other Ambulatory Visit (INDEPENDENT_AMBULATORY_CARE_PROVIDER_SITE_OTHER): Payer: Self-pay | Admitting: Internal Medicine

## 2016-01-31 ENCOUNTER — Encounter (HOSPITAL_COMMUNITY): Payer: Self-pay | Admitting: Internal Medicine

## 2016-02-09 ENCOUNTER — Other Ambulatory Visit: Payer: Self-pay | Admitting: Family Medicine

## 2016-02-09 DIAGNOSIS — N5201 Erectile dysfunction due to arterial insufficiency: Secondary | ICD-10-CM | POA: Diagnosis not present

## 2016-02-09 DIAGNOSIS — R6882 Decreased libido: Secondary | ICD-10-CM | POA: Diagnosis not present

## 2016-02-09 DIAGNOSIS — E538 Deficiency of other specified B group vitamins: Secondary | ICD-10-CM | POA: Diagnosis not present

## 2016-02-10 LAB — VITAMIN B12: VITAMIN B 12: 682 pg/mL (ref 200–1100)

## 2016-02-11 LAB — BASIC METABOLIC PANEL
BUN: 9 mg/dL (ref 7–25)
CHLORIDE: 93 mmol/L — AB (ref 98–110)
CO2: 18 mmol/L — ABNORMAL LOW (ref 20–31)
Calcium: 8.7 mg/dL (ref 8.6–10.3)
Creat: 0.83 mg/dL (ref 0.70–1.25)
Glucose, Bld: 87 mg/dL (ref 65–99)
POTASSIUM: 4.8 mmol/L (ref 3.5–5.3)
Sodium: 130 mmol/L — ABNORMAL LOW (ref 135–146)

## 2016-02-11 LAB — TESTOSTERONE TOTAL,FREE,BIO, MALES
ALBUMIN: 4 g/dL (ref 3.6–5.1)
Sex Hormone Binding: 77 nmol/L (ref 22–77)
TESTOSTERONE BIOAVAILABLE: 40.2 ng/dL — AB (ref 130.5–681.7)
TESTOSTERONE FREE: 21.9 pg/mL — AB (ref 47.0–244.0)
TESTOSTERONE: 353 ng/dL (ref 250–827)

## 2016-02-13 ENCOUNTER — Encounter: Payer: Self-pay | Admitting: Family Medicine

## 2016-02-14 ENCOUNTER — Other Ambulatory Visit: Payer: Self-pay | Admitting: Family Medicine

## 2016-02-14 DIAGNOSIS — R7989 Other specified abnormal findings of blood chemistry: Secondary | ICD-10-CM

## 2016-02-14 DIAGNOSIS — Z125 Encounter for screening for malignant neoplasm of prostate: Secondary | ICD-10-CM

## 2016-02-17 DIAGNOSIS — Z886 Allergy status to analgesic agent status: Secondary | ICD-10-CM | POA: Diagnosis not present

## 2016-02-17 DIAGNOSIS — R0602 Shortness of breath: Secondary | ICD-10-CM | POA: Diagnosis not present

## 2016-02-17 DIAGNOSIS — R072 Precordial pain: Secondary | ICD-10-CM | POA: Diagnosis not present

## 2016-02-17 DIAGNOSIS — R079 Chest pain, unspecified: Secondary | ICD-10-CM | POA: Diagnosis not present

## 2016-02-17 DIAGNOSIS — R1013 Epigastric pain: Secondary | ICD-10-CM | POA: Diagnosis not present

## 2016-02-17 DIAGNOSIS — K769 Liver disease, unspecified: Secondary | ICD-10-CM | POA: Diagnosis not present

## 2016-02-17 DIAGNOSIS — R6883 Chills (without fever): Secondary | ICD-10-CM | POA: Diagnosis not present

## 2016-02-17 DIAGNOSIS — F17210 Nicotine dependence, cigarettes, uncomplicated: Secondary | ICD-10-CM | POA: Diagnosis not present

## 2016-02-17 DIAGNOSIS — K219 Gastro-esophageal reflux disease without esophagitis: Secondary | ICD-10-CM | POA: Diagnosis not present

## 2016-02-17 DIAGNOSIS — R197 Diarrhea, unspecified: Secondary | ICD-10-CM | POA: Diagnosis not present

## 2016-02-17 DIAGNOSIS — G4700 Insomnia, unspecified: Secondary | ICD-10-CM | POA: Diagnosis not present

## 2016-02-17 DIAGNOSIS — R112 Nausea with vomiting, unspecified: Secondary | ICD-10-CM | POA: Diagnosis not present

## 2016-02-17 DIAGNOSIS — Z8719 Personal history of other diseases of the digestive system: Secondary | ICD-10-CM | POA: Diagnosis not present

## 2016-02-17 DIAGNOSIS — Z8673 Personal history of transient ischemic attack (TIA), and cerebral infarction without residual deficits: Secondary | ICD-10-CM | POA: Diagnosis not present

## 2016-02-20 DIAGNOSIS — R079 Chest pain, unspecified: Secondary | ICD-10-CM | POA: Diagnosis not present

## 2016-03-07 ENCOUNTER — Ambulatory Visit (INDEPENDENT_AMBULATORY_CARE_PROVIDER_SITE_OTHER): Payer: Self-pay | Admitting: Internal Medicine

## 2016-03-20 ENCOUNTER — Other Ambulatory Visit: Payer: Self-pay | Admitting: *Deleted

## 2016-03-20 DIAGNOSIS — E291 Testicular hypofunction: Secondary | ICD-10-CM | POA: Diagnosis not present

## 2016-03-20 DIAGNOSIS — Z125 Encounter for screening for malignant neoplasm of prostate: Secondary | ICD-10-CM | POA: Diagnosis not present

## 2016-03-20 DIAGNOSIS — R7989 Other specified abnormal findings of blood chemistry: Secondary | ICD-10-CM

## 2016-03-20 NOTE — Addendum Note (Signed)
Addended by: Onalee Hua on: 03/20/2016 01:39 PM   Modules accepted: Orders

## 2016-03-21 ENCOUNTER — Telehealth (INDEPENDENT_AMBULATORY_CARE_PROVIDER_SITE_OTHER): Payer: Self-pay | Admitting: Internal Medicine

## 2016-03-21 ENCOUNTER — Other Ambulatory Visit: Payer: Self-pay | Admitting: *Deleted

## 2016-03-21 DIAGNOSIS — K5000 Crohn's disease of small intestine without complications: Secondary | ICD-10-CM

## 2016-03-21 DIAGNOSIS — F419 Anxiety disorder, unspecified: Secondary | ICD-10-CM

## 2016-03-21 LAB — CBC
HEMATOCRIT: 41.5 % (ref 38.5–50.0)
HEMOGLOBIN: 14.3 g/dL (ref 13.2–17.1)
MCH: 32.7 pg (ref 27.0–33.0)
MCHC: 34.5 g/dL (ref 32.0–36.0)
MCV: 95 fL (ref 80.0–100.0)
MPV: 11.1 fL (ref 7.5–12.5)
Platelets: 116 10*3/uL — ABNORMAL LOW (ref 140–400)
RBC: 4.37 MIL/uL (ref 4.20–5.80)
RDW: 14.5 % (ref 11.0–15.0)
WBC: 6.8 10*3/uL (ref 3.8–10.8)

## 2016-03-21 LAB — TSH: TSH: 2.56 mIU/L (ref 0.40–4.50)

## 2016-03-21 MED ORDER — LORAZEPAM 1 MG PO TABS: 1.0000 mg | ORAL_TABLET | Freq: Two times a day (BID) | ORAL | 1 refills | Status: DC

## 2016-03-21 MED ORDER — LORAZEPAM 1 MG PO TABS: 1.0000 mg | ORAL_TABLET | Freq: Two times a day (BID) | ORAL | 2 refills | Status: DC

## 2016-03-21 NOTE — Telephone Encounter (Signed)
Patient presented to the office, would like a refill on Lorazepam 48m, he stated he is out.  He would like to pick up a prescription vs it being called in.  [phone]

## 2016-03-21 NOTE — Telephone Encounter (Signed)
Noted. Teri h as taken care of this .

## 2016-03-21 NOTE — Telephone Encounter (Signed)
RF request for lorazepam LOV: 01/25/16 Next ov: 04/26/16 Last written: 11/12/15 #60 w/ 2RF  Please advise. Thanks.

## 2016-03-21 NOTE — Telephone Encounter (Signed)
Gary Vasquez has already taken care of this

## 2016-03-21 NOTE — Telephone Encounter (Signed)
Rx faxed

## 2016-03-22 ENCOUNTER — Telehealth: Payer: Self-pay | Admitting: Family Medicine

## 2016-03-22 LAB — PSA, MEDICARE: PSA: 1.43 ng/mL

## 2016-03-22 LAB — LUTEINIZING HORMONE: LH: 16.5 m[IU]/mL — AB (ref 1.6–15.2)

## 2016-03-22 LAB — TESTOSTERONE TOTAL,FREE,BIO, MALES
ALBUMIN: 3.8 g/dL (ref 3.6–5.1)
Sex Hormone Binding: 71 nmol/L (ref 22–77)
TESTOSTERONE BIOAVAILABLE: 62.3 ng/dL — AB (ref 130.5–681.7)
TESTOSTERONE: 512 ng/dL (ref 250–827)
Testosterone, Free: 35.6 pg/mL — ABNORMAL LOW (ref 47.0–244.0)

## 2016-03-22 LAB — PROLACTIN: PROLACTIN: 9.5 ng/mL (ref 2.0–18.0)

## 2016-03-22 NOTE — Telephone Encounter (Signed)
Patient went to lab in [place] yesterday. He has made an appointment for Monday 03/27/16 (first 30 min appt avail) but he wanted one earlier to discuss the results and because he feels like he is getting worse. Can we get him in earlier?

## 2016-03-22 NOTE — Telephone Encounter (Signed)
Spoke to pt and advised him that we would call him with his lab results and that we did not have any apts available before Monday. He voiced understanding.   Please advise of lab result when they come in. Thanks.

## 2016-03-22 NOTE — Telephone Encounter (Signed)
Rx written for Ativan. Printer would not print Rx. After patient left printer printed and I put in shred box.

## 2016-03-22 NOTE — Telephone Encounter (Signed)
Noted  

## 2016-03-23 DIAGNOSIS — M25511 Pain in right shoulder: Secondary | ICD-10-CM | POA: Diagnosis not present

## 2016-03-23 DIAGNOSIS — D696 Thrombocytopenia, unspecified: Secondary | ICD-10-CM | POA: Diagnosis not present

## 2016-03-23 DIAGNOSIS — M5412 Radiculopathy, cervical region: Secondary | ICD-10-CM | POA: Diagnosis not present

## 2016-03-23 DIAGNOSIS — R079 Chest pain, unspecified: Secondary | ICD-10-CM | POA: Diagnosis not present

## 2016-03-23 DIAGNOSIS — R072 Precordial pain: Secondary | ICD-10-CM | POA: Diagnosis not present

## 2016-03-23 DIAGNOSIS — R0789 Other chest pain: Secondary | ICD-10-CM | POA: Diagnosis not present

## 2016-03-24 DIAGNOSIS — M25511 Pain in right shoulder: Secondary | ICD-10-CM | POA: Diagnosis not present

## 2016-03-24 DIAGNOSIS — R0789 Other chest pain: Secondary | ICD-10-CM | POA: Diagnosis not present

## 2016-03-24 DIAGNOSIS — D696 Thrombocytopenia, unspecified: Secondary | ICD-10-CM | POA: Diagnosis not present

## 2016-03-27 ENCOUNTER — Encounter: Payer: Self-pay | Admitting: Family Medicine

## 2016-03-27 ENCOUNTER — Ambulatory Visit (INDEPENDENT_AMBULATORY_CARE_PROVIDER_SITE_OTHER): Payer: Medicare Other | Admitting: Family Medicine

## 2016-03-27 VITALS — BP 102/70 | HR 106 | Temp 97.9°F | Resp 16 | Ht 72.0 in | Wt 166.2 lb

## 2016-03-27 DIAGNOSIS — M5010 Cervical disc disorder with radiculopathy, unspecified cervical region: Secondary | ICD-10-CM

## 2016-03-27 DIAGNOSIS — R112 Nausea with vomiting, unspecified: Secondary | ICD-10-CM | POA: Diagnosis not present

## 2016-03-27 DIAGNOSIS — R1084 Generalized abdominal pain: Secondary | ICD-10-CM

## 2016-03-27 DIAGNOSIS — E291 Testicular hypofunction: Secondary | ICD-10-CM

## 2016-03-27 LAB — CBC WITH DIFFERENTIAL/PLATELET
BASOS ABS: 0 10*3/uL (ref 0.0–0.1)
Basophils Relative: 0.4 % (ref 0.0–3.0)
EOS PCT: 0.5 % (ref 0.0–5.0)
Eosinophils Absolute: 0 10*3/uL (ref 0.0–0.7)
HEMATOCRIT: 41.4 % (ref 39.0–52.0)
HEMOGLOBIN: 13.8 g/dL (ref 13.0–17.0)
LYMPHS ABS: 1.7 10*3/uL (ref 0.7–4.0)
LYMPHS PCT: 22.7 % (ref 12.0–46.0)
MCHC: 33.3 g/dL (ref 30.0–36.0)
MCV: 98.8 fl (ref 78.0–100.0)
MONOS PCT: 9.2 % (ref 3.0–12.0)
Monocytes Absolute: 0.7 10*3/uL (ref 0.1–1.0)
NEUTROS PCT: 67.2 % (ref 43.0–77.0)
Neutro Abs: 4.9 10*3/uL (ref 1.4–7.7)
Platelets: 122 10*3/uL — ABNORMAL LOW (ref 150.0–400.0)
RBC: 4.19 Mil/uL — AB (ref 4.22–5.81)
RDW: 15.3 % (ref 11.5–15.5)
WBC: 7.3 10*3/uL (ref 4.0–10.5)

## 2016-03-27 LAB — COMPREHENSIVE METABOLIC PANEL
ALBUMIN: 3.4 g/dL — AB (ref 3.5–5.2)
ALK PHOS: 121 U/L — AB (ref 39–117)
ALT: 49 U/L (ref 0–53)
AST: 89 U/L — ABNORMAL HIGH (ref 0–37)
BILIRUBIN TOTAL: 2 mg/dL — AB (ref 0.2–1.2)
BUN: 10 mg/dL (ref 6–23)
CALCIUM: 9 mg/dL (ref 8.4–10.5)
CO2: 17 mEq/L — ABNORMAL LOW (ref 19–32)
Chloride: 99 mEq/L (ref 96–112)
Creatinine, Ser: 0.89 mg/dL (ref 0.40–1.50)
GFR: 91.85 mL/min (ref >60.00)
GLUCOSE: 134 mg/dL — AB (ref 70–99)
POTASSIUM: 3.7 meq/L (ref 3.5–5.1)
Sodium: 129 mEq/L — ABNORMAL LOW (ref 135–145)
TOTAL PROTEIN: 7.3 g/dL (ref 6.0–8.3)

## 2016-03-27 LAB — LIPASE: Lipase: 37 U/L (ref 11.0–59.0)

## 2016-03-27 MED ORDER — MELOXICAM 15 MG PO TABS: 15.0000 mg | ORAL_TABLET | Freq: Every day | ORAL | 1 refills | Status: DC

## 2016-03-27 MED ORDER — CYCLOBENZAPRINE HCL 10 MG PO TABS: 10.0000 mg | ORAL_TABLET | Freq: Three times a day (TID) | ORAL | 1 refills | Status: DC | PRN

## 2016-03-27 NOTE — Progress Notes (Signed)
Pre visit review using our clinic review tool, if applicable. No additional management support is needed unless otherwise documented below in the visit note. 

## 2016-03-27 NOTE — Progress Notes (Signed)
OFFICE VISIT  03/27/2016   CC:  Chief Complaint  Patient presents with  . Back Pain  . Anorexia   HPI:    Patient is a 65 y.o. Caucasian male who presents for f/u hypogonadism : labs equivocal recently so I have a call in to an endocrinologist to discuss these in depth.  Recent hospitalization for chest pain but also significant R shoulder pain and now neck and scapular pain and deltoid region pain---all since about 5 d/a.  The pain seems to start in R trap region and radiates down upper arm and then to back of neck.  He took tramadol--no help.  He got a steroid shot into his shoulder while in hospital recently and he can't tell any improvement.  Has intermittent tingling and numbness feeling down right arm into fingers.  No accident/trauma/strain prior to onset of this recent pain.  Has had trouble eating last few weeks: eats a few bites and makes him nauseas and he throws up a lot, hurts on right mid/upper abdomen. Throwing up makes him feel better for a little while.  No dysphagia or odynophagia.   He says the hospital MD in [place] recently told him that he had gallstones.  I have no records at this time.  Still drinking alcohol, but less since not feeling well the last week.  "About 1 a day". Pt not talkative today, very focused on his pain.  Past Medical History:  Diagnosis Date  . Alcoholic hepatitis   . Alcoholic pancreatitis   . Alcoholism ([place])    Still drinking as of 11/12/15  . Allergy   . Anxiety and depression   . Arthritis   . Aspiration pneumonitis ([facility])    recurrent  . Asymptomatic gallstones 2014   one stone  . Bile reflux esophagitis    Chronic; due to gastrojejunostomy.  He was evaluated by Dr. Demetrio Lapping of [facility] last year for Roux-en-Y procedure. He decided not to proceed with surgery because of high risk. Metoclopramide resulted in diarrhea and dominant and domperodone was ineffective.   . Chronic back pain   . Chronic diarrhea   . Crohn disease  ([place])    in remission since approx 1995  . DDD (degenerative disc disease), cervical 02/2013   MRI by Dr. Joneen Caraway  . DDD (degenerative disc disease), lumbar    L > R lumbar radiculopathy  . Dumping syndrome   . Encephalomalacia on imaging study 02/2013   MRI brain w/o contrast: Mild progression of left occipital lobe encephalomalacia and  . Essential hypertension, benign   . Fatty liver 02/2013   Abd u/s   . Gastroparesis   . GERD (gastroesophageal reflux disease)   . History of liver failure    (alchoholic) Recovered in hosp  . Hypogonadism male   . Migraine headache   . Paraesophageal hernia   . Subdural hematoma ([place])   . TIA (transient ischemic attack)     Past Surgical History:  Procedure Laterality Date  . CARDIOVASCULAR STRESS TEST  01/30/14   Myocardial perfusion imaging: NORMAL ([name]).  . COLON RESECTION Right    right colon resection and ileocolonic anastamosis  . COLONOSCOPY N/A 07/02/2013   Procedure: COLONOSCOPY;  Surgeon: Rogene Houston, MD;  [facility];  Service: Endoscopy;  Laterality: N/A;  200  . ESOPHAGEAL DILATION N/A 10/30/2014   Procedure: ESOPHAGEAL DILATION;  Surgeon: Rogene Houston, MD;  [facility];  Service: Endoscopy;  Laterality: N/A;  . ESOPHAGEAL  DILATION N/A 02/12/2015   Procedure: ESOPHAGEAL DILATION;  Surgeon: Rogene Houston, MD;  [facility];  Service: Endoscopy;  Laterality: N/A;  . ESOPHAGEAL DILATION  01/21/16  . ESOPHAGEAL DILATION N/A 01/21/2016   Procedure: ESOPHAGEAL DILATION;  Surgeon: Rogene Houston, MD;  [facility];  Service: Endoscopy;  Laterality: N/A;  . ESOPHAGOGASTRODUODENOSCOPY N/A 08/14/2013   Procedure: ESOPHAGOGASTRODUODENOSCOPY (EGD);  Surgeon: Rogene Houston, MD;  [facility];  Service: Endoscopy;  Laterality: N/A;  300  . ESOPHAGOGASTRODUODENOSCOPY N/A 10/30/2014   Procedure: ESOPHAGOGASTRODUODENOSCOPY (EGD);  Surgeon: Rogene Houston, MD;  [facility];  Service: Endoscopy;  Laterality: N/A;  940  . ESOPHAGOGASTRODUODENOSCOPY N/A 02/12/2015   Procedure: ESOPHAGOGASTRODUODENOSCOPY (EGD);  Surgeon: Rogene Houston, MD;  [facility];  Service: Endoscopy;  Laterality: N/A;  105  . ESOPHAGOGASTRODUODENOSCOPY  01/21/16   gastritis/reactive gastropathy.  H pylori neg.  Bx benign.  . ESOPHAGOGASTRODUODENOSCOPY N/A 01/21/2016   Procedure: ESOPHAGOGASTRODUODENOSCOPY (EGD);  Surgeon: Rogene Houston, MD;  [facility];  Service: Endoscopy;  Laterality: N/A;  1:10 - moved to 5/26 @ 8:25  . HIATAL HERNIA REPAIR  09/2007   He underwent repair of large hiatal hernia and pyloroplasty which was complicated by pyloroplasty leak and subsequent gastrojejunostomy  . NISSEN FUNDOPLICATION    . NOCTURNAL POLYSOMNOGRAM  05/2013   Severe periodic limb movement d/o: trial of dopamine agonist such as requip was recommended.  Marland Kitchen PYLOROPLASTY  2009  . TRANSTHORACIC ECHOCARDIOGRAM  12/2013   EF 65-70%, mild LVH, grade I DD, no valvular problems.    Outpatient Medications Prior to Visit  Medication Sig Dispense Refill  . gabapentin (NEURONTIN) 300 MG capsule Take 300-600 mg by mouth 4 (four) times daily. 1 capsule in the morning, noon and evening, and 2 capsules at bedtime.    Marland Kitchen loperamide (IMODIUM) 2 MG capsule Take 2 mg by mouth as needed for diarrhea or loose stools.    Marland Kitchen loratadine (CLARITIN) 10 MG tablet Take 10 mg by mouth daily.     Marland Kitchen LORazepam (ATIVAN) 1 MG tablet Take 1 tablet (1 mg total) by mouth 2 (two) times daily. 60 tablet 2  . Multiple Vitamin (MULTIVITAMIN WITH MINERALS) TABS tablet Take 1 tablet by mouth daily.    Marland Kitchen omeprazole (PRILOSEC) 40 MG capsule Take 1 capsule (40 mg total) by mouth daily before breakfast. (Patient taking differently: Take 40 mg by mouth as needed. ) 30 capsule 5  . ranitidine (ZANTAC) 150 MG tablet Take 1 tablet (150 mg total) by mouth at bedtime. (Patient taking differently: Take 150 mg by mouth 2 (two)  times daily. ) 60 tablet 5  . traMADol (ULTRAM) 50 MG tablet TAKE 1 OR 2 TABLETS UP TO 3 TIMES DAILY AS NEEDED FOR PAIN. 60 tablet 0  . LORazepam (ATIVAN) 1 MG tablet Take 1 tablet (1 mg total) by mouth 2 (two) times daily. (Patient not taking: Reported on 03/27/2016) 60 tablet 1  . ondansetron (ZOFRAN) 4 MG tablet Take 1 tablet (4 mg total) by mouth every 8 (eight) hours as needed for nausea or vomiting. (Patient not taking: Reported on 03/27/2016) 30 tablet 1   No facility-administered medications prior to visit.     Allergies  Allergen Reactions  . Acetaminophen Other (See Comments)    Liver issues  . Norvasc [Amlodipine] Other (See Comments)    "felt bad"    ROS As per HPI  PE: Blood pressure 102/70, pulse Marland Kitchen)  106, temperature 97.9 F (36.6 C), temperature source Oral, resp. rate 16, height 6' (1.829 m), weight 166 lb 4 oz (75.4 kg), SpO2 94 %. Gen: Alert, well appearing.  Patient is oriented to person, place, time, and situation. XTK:WIOX: no injection, icteris, swelling, or exudate.  EOMI, PERRLA. Mouth: lips without lesion/swelling.  Oral mucosa pink and moist. Oropharynx without erythema, exudate, or swelling.  Neck - No masses or thyromegaly or limitation in range of motion CV: RRR, no m/r/g.   LUNGS: CTA bilat, nonlabored resps, good aeration in all lung fields. ABD: soft, ND, mild RUQ and right mid abd TTP, otherwise nontender.  No guarding or rebound. EXT: no clubbing, cyanosis, or edema.  Musculoskeletal: pt states it hurts when I touch anywhere on his neck, right trapezius, all along mid and lower back bilat, right shoulder, right deltoid.  Any movement of R shoulder causes him to wince in pain.  Neck ROM markedly limited due to pain.  It was difficult to do a spurling's test on him due to lack of c spine ROM but essentially the maneuver brought about worsened R shoulder and R arm pain, ?+ tingling R arm. No weaknes in UE's.  LABS:  None today  IMPRESSION AND  PLAN:  1) R shoulder pain: coupled with his neck pain and his exam findings today I suspect he has a cervical radiculopathy.  He has an orthopedist, whom I encouraged him to make f/u appt with. Pt asked for pain medication.  I said no and he said "well I guess I'm just wasting my time here then". After I let silence fall for a while he asked for muscle relaxer, which I agreed to prescribe: flexeril 47m tid prn, #60, RF x 1.  I also rx'd meloxicam 110mqd. Due to his history of ongoing drinking alcohol I have told him that I refuse to prescribe him narcotic pain medication.  2) Postprandial RUQ abd pain, with n/v. He has remote hx of one gallstone being noted on an abd u/s when he was asymptomatic. He says [facility] Check CBC, CMET, and lipase today.  3) Hypogonadism: my endocrinology colleague called back and we discussed pt's labs. Her recommendation was to recheck testosterone panel and LH in 4 wks (fasting, early morning), and if free testost was still low then she recommended I refer him to her.  An After Visit Summary was printed and given to the patient.  FOLLOW UP: Return for f/u to be determined based on results of work-up.  Signed:  PhCrissie SicklesMD           03/27/2016

## 2016-03-29 ENCOUNTER — Other Ambulatory Visit: Payer: Self-pay | Admitting: *Deleted

## 2016-03-29 ENCOUNTER — Other Ambulatory Visit: Payer: Self-pay | Admitting: Family Medicine

## 2016-03-29 DIAGNOSIS — R112 Nausea with vomiting, unspecified: Secondary | ICD-10-CM

## 2016-03-29 DIAGNOSIS — F1020 Alcohol dependence, uncomplicated: Secondary | ICD-10-CM

## 2016-03-29 DIAGNOSIS — R7989 Other specified abnormal findings of blood chemistry: Secondary | ICD-10-CM

## 2016-03-29 DIAGNOSIS — R1011 Right upper quadrant pain: Secondary | ICD-10-CM

## 2016-04-01 ENCOUNTER — Ambulatory Visit (HOSPITAL_BASED_OUTPATIENT_CLINIC_OR_DEPARTMENT_OTHER): Payer: Medicare Other

## 2016-04-01 ENCOUNTER — Ambulatory Visit (HOSPITAL_BASED_OUTPATIENT_CLINIC_OR_DEPARTMENT_OTHER)
Admission: RE | Admit: 2016-04-01 | Discharge: 2016-04-01 | Disposition: A | Discharge status: Home / Self Care | Payer: Medicare Other | Source: Ambulatory Visit | Attending: Family Medicine | Admitting: Family Medicine

## 2016-04-01 DIAGNOSIS — K760 Fatty (change of) liver, not elsewhere classified: Secondary | ICD-10-CM | POA: Insufficient documentation

## 2016-04-01 DIAGNOSIS — R112 Nausea with vomiting, unspecified: Secondary | ICD-10-CM | POA: Insufficient documentation

## 2016-04-01 DIAGNOSIS — R1010 Upper abdominal pain, unspecified: Secondary | ICD-10-CM | POA: Diagnosis present

## 2016-04-01 DIAGNOSIS — F1020 Alcohol dependence, uncomplicated: Secondary | ICD-10-CM | POA: Diagnosis not present

## 2016-04-01 DIAGNOSIS — R1011 Right upper quadrant pain: Secondary | ICD-10-CM

## 2016-04-08 DIAGNOSIS — R4182 Altered mental status, unspecified: Secondary | ICD-10-CM | POA: Diagnosis not present

## 2016-04-08 DIAGNOSIS — R410 Disorientation, unspecified: Secondary | ICD-10-CM | POA: Diagnosis not present

## 2016-04-08 DIAGNOSIS — I639 Cerebral infarction, unspecified: Secondary | ICD-10-CM | POA: Diagnosis not present

## 2016-04-08 DIAGNOSIS — G9341 Metabolic encephalopathy: Secondary | ICD-10-CM | POA: Diagnosis not present

## 2016-04-10 DIAGNOSIS — R4182 Altered mental status, unspecified: Secondary | ICD-10-CM | POA: Diagnosis not present

## 2016-04-26 ENCOUNTER — Ambulatory Visit: Payer: Medicare Other | Admitting: Family Medicine

## 2016-04-26 ENCOUNTER — Encounter: Payer: Self-pay | Admitting: Family Medicine

## 2016-05-16 DIAGNOSIS — M47812 Spondylosis without myelopathy or radiculopathy, cervical region: Secondary | ICD-10-CM | POA: Diagnosis not present

## 2016-05-16 DIAGNOSIS — Z8673 Personal history of transient ischemic attack (TIA), and cerebral infarction without residual deficits: Secondary | ICD-10-CM | POA: Diagnosis not present

## 2016-05-16 DIAGNOSIS — M545 Low back pain: Secondary | ICD-10-CM | POA: Diagnosis not present

## 2016-05-16 DIAGNOSIS — M542 Cervicalgia: Secondary | ICD-10-CM | POA: Diagnosis not present

## 2016-05-16 DIAGNOSIS — Z8739 Personal history of other diseases of the musculoskeletal system and connective tissue: Secondary | ICD-10-CM | POA: Diagnosis not present

## 2016-05-16 DIAGNOSIS — M47816 Spondylosis without myelopathy or radiculopathy, lumbar region: Secondary | ICD-10-CM | POA: Diagnosis not present

## 2016-05-17 DIAGNOSIS — M542 Cervicalgia: Secondary | ICD-10-CM | POA: Diagnosis not present

## 2016-05-25 ENCOUNTER — Ambulatory Visit (INDEPENDENT_AMBULATORY_CARE_PROVIDER_SITE_OTHER): Payer: Self-pay | Admitting: Internal Medicine

## 2016-05-28 HISTORY — PX: CHOLECYSTECTOMY: SHX55

## 2016-05-29 DIAGNOSIS — M47816 Spondylosis without myelopathy or radiculopathy, lumbar region: Secondary | ICD-10-CM | POA: Diagnosis not present

## 2016-05-29 DIAGNOSIS — M9973 Connective tissue and disc stenosis of intervertebral foramina of lumbar region: Secondary | ICD-10-CM | POA: Diagnosis not present

## 2016-06-14 DIAGNOSIS — M47816 Spondylosis without myelopathy or radiculopathy, lumbar region: Secondary | ICD-10-CM | POA: Diagnosis not present

## 2016-06-14 DIAGNOSIS — M479 Spondylosis, unspecified: Secondary | ICD-10-CM | POA: Diagnosis not present

## 2016-06-14 DIAGNOSIS — M545 Low back pain: Secondary | ICD-10-CM | POA: Diagnosis not present

## 2016-06-14 DIAGNOSIS — Z888 Allergy status to other drugs, medicaments and biological substances status: Secondary | ICD-10-CM | POA: Diagnosis not present

## 2016-06-25 DIAGNOSIS — K8001 Calculus of gallbladder with acute cholecystitis with obstruction: Secondary | ICD-10-CM | POA: Diagnosis not present

## 2016-06-25 DIAGNOSIS — K8020 Calculus of gallbladder without cholecystitis without obstruction: Secondary | ICD-10-CM | POA: Diagnosis not present

## 2016-06-25 DIAGNOSIS — R161 Splenomegaly, not elsewhere classified: Secondary | ICD-10-CM | POA: Diagnosis not present

## 2016-06-25 DIAGNOSIS — K8000 Calculus of gallbladder with acute cholecystitis without obstruction: Secondary | ICD-10-CM | POA: Diagnosis not present

## 2016-06-25 DIAGNOSIS — K5090 Crohn's disease, unspecified, without complications: Secondary | ICD-10-CM | POA: Diagnosis not present

## 2016-06-26 DIAGNOSIS — K8012 Calculus of gallbladder with acute and chronic cholecystitis without obstruction: Secondary | ICD-10-CM | POA: Diagnosis not present

## 2016-06-26 DIAGNOSIS — K8000 Calculus of gallbladder with acute cholecystitis without obstruction: Secondary | ICD-10-CM | POA: Diagnosis not present

## 2016-06-26 DIAGNOSIS — K819 Cholecystitis, unspecified: Secondary | ICD-10-CM | POA: Diagnosis not present

## 2016-06-26 DIAGNOSIS — K8013 Calculus of gallbladder with acute and chronic cholecystitis with obstruction: Secondary | ICD-10-CM | POA: Diagnosis not present

## 2016-06-26 DIAGNOSIS — K8001 Calculus of gallbladder with acute cholecystitis with obstruction: Secondary | ICD-10-CM | POA: Diagnosis not present

## 2016-06-26 DIAGNOSIS — K660 Peritoneal adhesions (postprocedural) (postinfection): Secondary | ICD-10-CM | POA: Diagnosis not present

## 2016-06-26 DIAGNOSIS — R590 Localized enlarged lymph nodes: Secondary | ICD-10-CM | POA: Diagnosis not present

## 2016-06-26 DIAGNOSIS — K828 Other specified diseases of gallbladder: Secondary | ICD-10-CM | POA: Diagnosis not present

## 2016-06-26 DIAGNOSIS — Z5331 Laparoscopic surgical procedure converted to open procedure: Secondary | ICD-10-CM | POA: Diagnosis not present

## 2016-06-27 DIAGNOSIS — R509 Fever, unspecified: Secondary | ICD-10-CM | POA: Diagnosis not present

## 2016-06-28 DIAGNOSIS — K8001 Calculus of gallbladder with acute cholecystitis with obstruction: Secondary | ICD-10-CM | POA: Diagnosis not present

## 2016-06-28 DIAGNOSIS — D62 Acute posthemorrhagic anemia: Secondary | ICD-10-CM | POA: Diagnosis not present

## 2016-06-28 DIAGNOSIS — K219 Gastro-esophageal reflux disease without esophagitis: Secondary | ICD-10-CM | POA: Diagnosis not present

## 2016-06-28 DIAGNOSIS — E871 Hypo-osmolality and hyponatremia: Secondary | ICD-10-CM | POA: Diagnosis not present

## 2016-06-28 DIAGNOSIS — K819 Cholecystitis, unspecified: Secondary | ICD-10-CM | POA: Diagnosis not present

## 2016-06-29 DIAGNOSIS — K219 Gastro-esophageal reflux disease without esophagitis: Secondary | ICD-10-CM | POA: Diagnosis not present

## 2016-06-29 DIAGNOSIS — D62 Acute posthemorrhagic anemia: Secondary | ICD-10-CM | POA: Diagnosis not present

## 2016-06-29 DIAGNOSIS — K8001 Calculus of gallbladder with acute cholecystitis with obstruction: Secondary | ICD-10-CM | POA: Diagnosis not present

## 2016-06-29 DIAGNOSIS — K819 Cholecystitis, unspecified: Secondary | ICD-10-CM | POA: Diagnosis not present

## 2016-06-29 DIAGNOSIS — J189 Pneumonia, unspecified organism: Secondary | ICD-10-CM | POA: Diagnosis not present

## 2016-06-29 DIAGNOSIS — E871 Hypo-osmolality and hyponatremia: Secondary | ICD-10-CM | POA: Diagnosis not present

## 2016-06-30 DIAGNOSIS — K819 Cholecystitis, unspecified: Secondary | ICD-10-CM | POA: Diagnosis not present

## 2016-06-30 DIAGNOSIS — E871 Hypo-osmolality and hyponatremia: Secondary | ICD-10-CM | POA: Diagnosis not present

## 2016-06-30 DIAGNOSIS — D62 Acute posthemorrhagic anemia: Secondary | ICD-10-CM | POA: Diagnosis not present

## 2016-06-30 DIAGNOSIS — J189 Pneumonia, unspecified organism: Secondary | ICD-10-CM | POA: Diagnosis not present

## 2016-06-30 DIAGNOSIS — K219 Gastro-esophageal reflux disease without esophagitis: Secondary | ICD-10-CM | POA: Diagnosis not present

## 2016-06-30 DIAGNOSIS — K8001 Calculus of gallbladder with acute cholecystitis with obstruction: Secondary | ICD-10-CM | POA: Diagnosis not present

## 2016-07-05 ENCOUNTER — Telehealth (INDEPENDENT_AMBULATORY_CARE_PROVIDER_SITE_OTHER): Payer: Self-pay | Admitting: Internal Medicine

## 2016-07-05 NOTE — Telephone Encounter (Signed)
Patient called, requesting a refill on Lorazepam and Tramadol.  [phone]

## 2016-07-06 ENCOUNTER — Other Ambulatory Visit (INDEPENDENT_AMBULATORY_CARE_PROVIDER_SITE_OTHER): Payer: Self-pay | Admitting: *Deleted

## 2016-07-06 MED ORDER — TRAMADOL HCL 50 MG PO TABS: 50.0000 mg | ORAL_TABLET | Freq: Two times a day (BID) | ORAL | 0 refills | Status: DC

## 2016-07-06 MED ORDER — LORAZEPAM 1 MG PO TABS: 1.0000 mg | ORAL_TABLET | Freq: Two times a day (BID) | ORAL | 2 refills | Status: DC

## 2016-07-06 NOTE — Telephone Encounter (Signed)
Prescriptions are ready and the patient will be made aware.

## 2016-07-06 NOTE — Telephone Encounter (Signed)
This will be addressed with Dr. Laural Golden or Lelon Perla. Patient will be called with their recommendation.

## 2016-07-25 ENCOUNTER — Ambulatory Visit (INDEPENDENT_AMBULATORY_CARE_PROVIDER_SITE_OTHER): Payer: Self-pay | Admitting: Internal Medicine

## 2016-07-25 ENCOUNTER — Ambulatory Visit: Payer: Self-pay | Admitting: Family Medicine

## 2016-07-25 DIAGNOSIS — Z0289 Encounter for other administrative examinations: Secondary | ICD-10-CM

## 2016-07-27 DIAGNOSIS — M47816 Spondylosis without myelopathy or radiculopathy, lumbar region: Secondary | ICD-10-CM | POA: Diagnosis not present

## 2016-08-28 DIAGNOSIS — R011 Cardiac murmur, unspecified: Secondary | ICD-10-CM

## 2016-08-28 DIAGNOSIS — E569 Vitamin deficiency, unspecified: Secondary | ICD-10-CM

## 2016-08-28 HISTORY — DX: Vitamin deficiency, unspecified: E56.9

## 2016-08-28 HISTORY — DX: Cardiac murmur, unspecified: R01.1

## 2016-09-07 DIAGNOSIS — R2240 Localized swelling, mass and lump, unspecified lower limb: Secondary | ICD-10-CM | POA: Diagnosis not present

## 2016-09-18 DIAGNOSIS — R2240 Localized swelling, mass and lump, unspecified lower limb: Secondary | ICD-10-CM | POA: Diagnosis not present

## 2016-09-27 DIAGNOSIS — D649 Anemia, unspecified: Secondary | ICD-10-CM | POA: Diagnosis not present

## 2016-09-27 DIAGNOSIS — D531 Other megaloblastic anemias, not elsewhere classified: Secondary | ICD-10-CM | POA: Diagnosis not present

## 2016-09-27 DIAGNOSIS — Z5181 Encounter for therapeutic drug level monitoring: Secondary | ICD-10-CM | POA: Diagnosis not present

## 2016-09-29 DIAGNOSIS — R079 Chest pain, unspecified: Secondary | ICD-10-CM | POA: Diagnosis not present

## 2016-09-30 DIAGNOSIS — R109 Unspecified abdominal pain: Secondary | ICD-10-CM | POA: Diagnosis not present

## 2016-10-02 DIAGNOSIS — R000 Tachycardia, unspecified: Secondary | ICD-10-CM | POA: Diagnosis not present

## 2016-10-03 ENCOUNTER — Encounter (INDEPENDENT_AMBULATORY_CARE_PROVIDER_SITE_OTHER): Payer: Self-pay

## 2016-10-03 ENCOUNTER — Ambulatory Visit (HOSPITAL_COMMUNITY)
Admission: RE | Admit: 2016-10-03 | Discharge: 2016-10-03 | Disposition: A | Discharge status: Home / Self Care | Payer: Medicare Other | Source: Ambulatory Visit | Attending: Internal Medicine | Admitting: Internal Medicine

## 2016-10-03 ENCOUNTER — Encounter (INDEPENDENT_AMBULATORY_CARE_PROVIDER_SITE_OTHER): Payer: Self-pay | Admitting: *Deleted

## 2016-10-03 ENCOUNTER — Encounter (INDEPENDENT_AMBULATORY_CARE_PROVIDER_SITE_OTHER): Payer: Self-pay | Admitting: Internal Medicine

## 2016-10-03 ENCOUNTER — Ambulatory Visit (INDEPENDENT_AMBULATORY_CARE_PROVIDER_SITE_OTHER): Payer: Medicare Other | Admitting: Internal Medicine

## 2016-10-03 VITALS — BP 118/78 | HR 74 | Temp 96.7°F | Resp 18 | Ht 72.0 in | Wt 161.6 lb

## 2016-10-03 DIAGNOSIS — F1010 Alcohol abuse, uncomplicated: Secondary | ICD-10-CM | POA: Diagnosis not present

## 2016-10-03 DIAGNOSIS — K224 Dyskinesia of esophagus: Secondary | ICD-10-CM | POA: Insufficient documentation

## 2016-10-03 DIAGNOSIS — R1319 Other dysphagia: Secondary | ICD-10-CM

## 2016-10-03 DIAGNOSIS — F411 Generalized anxiety disorder: Secondary | ICD-10-CM

## 2016-10-03 DIAGNOSIS — R1310 Dysphagia, unspecified: Secondary | ICD-10-CM | POA: Diagnosis not present

## 2016-10-03 DIAGNOSIS — K222 Esophageal obstruction: Secondary | ICD-10-CM | POA: Insufficient documentation

## 2016-10-03 MED ORDER — LORAZEPAM 1 MG PO TABS: 1.0000 mg | ORAL_TABLET | Freq: Two times a day (BID) | ORAL | 2 refills | Status: DC

## 2016-10-03 NOTE — Patient Instructions (Signed)
Barium pill esophagogram to be scheduled. 

## 2016-10-03 NOTE — Progress Notes (Signed)
Presenting complaint;  Dysphagia, nausea and vomiting.  Subjective:  Patient is 66 year old Caucasian male who is here for scheduled visit accompanied by his daughter Ailene Ravel. He was last seen in May 2017.His daughter states he is not doing well. He is presently living with his elderly parents in [place]. He underwent gallbladder surgery in October 2017. Laparoscopic surgery was not possible and he underwent open cholecystectomy. He was in the hospital for 1 week and thereafter in rehabilitation. He was also on prednisone for a few weeks to help with his appetite. Patient complains of dysphagia with solids and at times with liquids. He vomits daily. He generally vomits fluid or food. He denies hematemesis melena or rectal bleeding. His daughter states he was able to abstain from drinking alcohol for 3 weeks and went into DTs during hospitalization. He has lost 10 pounds since his last visit. His appetite is not good. He continues to drink alcohol. He is drinking 3to 4 20 ounces cans of beer daily. His daughter also reports hallucinations at night. He also complains of epigastric pain as well as back pain. He states he is on gabapentin for peripheral neuropathy. Patient states he had blood work in [place] few weeks ago.   Current Medications: Outpatient Encounter Prescriptions as of 10/03/2016  Medication Sig  . gabapentin (NEURONTIN) 300 MG capsule Take 300-600 mg by mouth 4 (four) times daily. 1 capsule in the morning, noon and evening, and 2 capsules at bedtime.  Marland Kitchen loperamide (IMODIUM) 2 MG capsule Take 2 mg by mouth as needed for diarrhea or loose stools.  Marland Kitchen loratadine (CLARITIN) 10 MG tablet Take 10 mg by mouth daily.   Marland Kitchen LORazepam (ATIVAN) 1 MG tablet Take 1 tablet (1 mg total) by mouth 2 (two) times daily.  Marland Kitchen omeprazole (PRILOSEC) 40 MG capsule Take 1 capsule (40 mg total) by mouth daily before breakfast. (Patient taking differently: Take 40 mg by mouth as needed. )  .  ranitidine (ZANTAC) 150 MG tablet Take 1 tablet (150 mg total) by mouth at bedtime. (Patient taking differently: Take 150 mg by mouth 2 (two) times daily. )  . cyclobenzaprine (FLEXERIL) 10 MG tablet Take 1 tablet (10 mg total) by mouth 3 (three) times daily as needed for muscle spasms. (Patient not taking: Reported on 10/03/2016)  . Multiple Vitamin (MULTIVITAMIN WITH MINERALS) TABS tablet Take 1 tablet by mouth daily.  . promethazine (PHENERGAN) 25 MG tablet Take 25 mg by mouth every 6 (six) hours as needed.  . sucralfate (CARAFATE) 1 GM/10ML suspension Take 1 g by mouth 4 (four) times daily.   . traMADol (ULTRAM) 50 MG tablet Take 1 tablet (50 mg total) by mouth 2 (two) times daily. Prn, patient is not to take no more than 2 per day and it must last 30 days. (Patient not taking: Reported on 10/03/2016)  . [DISCONTINUED] meloxicam (MOBIC) 15 MG tablet Take 1 tablet (15 mg total) by mouth daily. (Patient not taking: Reported on 10/03/2016)   No facility-administered encounter medications on file as of 10/03/2016.      Objective: Blood pressure 118/78, pulse 74, temperature (!) 96.7 F (35.9 C), temperature source Oral, resp. rate 18, height 6' (1.829 m), weight 161 lb 9.6 oz (73.3 kg). Patient is alert and in no acute distress. He does not have asterixis. He has mild tremors to both outstretched hands. Conjunctiva is pink. Sclera is nonicteric Oropharyngeal mucosa is normal. No neck masses or thyromegaly noted. Cardiac exam with regular rhythm normal S1 and  S2. No murmur or gallop noted. Lungs are clear to auscultation. Abdomen is symmetrical. He has midline as well as right subcostal scar. Bowel sounds are normal. Abdomen is soft. Liver edge is easily palpable below RCM. It is soft and nontender. He has trace edema around ankles.  Labs/studies Results: Recent lab studies not available.   Assessment:  #1. Solid food dysphagia. He has history of distal esophageal stricture which has been  dilated on multiple occasions and more recently in May 2017. Poorly controlled GERD symptoms may be the reason for recurrent stricture. He will be evaluated with barium study prior to considering EGD with dilation. #2. Chronic nausea and vomiting. His nausea and vomiting appears to be multifactorial. He has due to known gastric bile reflux and continued alcohol abuse is perpetuating his symptoms. Recent NSAID use may have also contemplated to his chronic nausea and vomiting. #3. Chronic anxiety. He is on low-dose lorazepam which would be continued for the time being. #4. Alcoholic liver disease. Note indicates congested liver. Liver biopsy not obtained. Suspect prednisone may have been alcoholic hepatitis. #5. History of Crohn's disease with remote right hemicolectomy. He remains in remission.  Patient's prognosis is poor unless he is able to overcome ethanol abuse.  Plan:  New prescription given for lorazepam 0.5 mg by mouth twice a day when necessary 60 with 2 refills. Patient advised not to take NSAIDs. Barium pill esophagogram. Patient's daughter was given list of rehabilitation centers he can contact patient is willing to proceed. Will request copy of recent bloodwork for review. Office visit in 3 months.

## 2016-10-04 ENCOUNTER — Encounter: Payer: Self-pay | Admitting: Family Medicine

## 2016-10-06 ENCOUNTER — Encounter (INDEPENDENT_AMBULATORY_CARE_PROVIDER_SITE_OTHER): Payer: Self-pay

## 2016-10-09 ENCOUNTER — Ambulatory Visit: Payer: Self-pay | Admitting: Family Medicine

## 2016-10-09 ENCOUNTER — Other Ambulatory Visit (INDEPENDENT_AMBULATORY_CARE_PROVIDER_SITE_OTHER): Payer: Self-pay | Admitting: *Deleted

## 2016-10-09 DIAGNOSIS — R1319 Other dysphagia: Secondary | ICD-10-CM

## 2016-10-09 NOTE — Progress Notes (Deleted)
OFFICE VISIT  10/09/2016   CC: No chief complaint on file.    HPI:    Patient is a 66 y.o. Caucasian male who presents for f/u HTN, alcoholism, anxiety and depression.  Past Medical History:  Diagnosis Date  . Alcoholic hepatitis   . Alcoholic liver disease ([place])   . Alcoholic pancreatitis   . Alcoholism ([place])    Still drinking as of 09/2016  . Allergy   . Anxiety and depression   . Arthritis   . Aspiration pneumonitis ([facility])    recurrent  . Bile reflux esophagitis    Chronic; due to gastrojejunostomy.  He was evaluated by Dr. Demetrio Lapping of [facility] last year for Roux-en-Y procedure. He decided not to proceed with surgery because of high risk. Metoclopramide resulted in diarrhea and dominant and domperodone was ineffective.   . Chronic back pain   . Chronic diarrhea   . Crohn disease ([place])    in remission since approx 1995  . DDD (degenerative disc disease), cervical 02/2013   MRI by Dr. Joneen Caraway  . DDD (degenerative disc disease), lumbar    L > R lumbar radiculopathy  . Dumping syndrome   . Encephalomalacia on imaging study 02/2013   MRI brain w/o contrast: Mild progression of left occipital lobe encephalomalacia and  . Essential hypertension, benign   . Fatty liver 02/2013; 03/2016   Abd u/s.  Also noted on CT's of abdomen.  . Gastroparesis   . GERD (gastroesophageal reflux disease)   . History of esophageal stricture    Recurrent--at GE junction.  Has required multiple dilation procedures.  Esophagrams have shown normal swallowing function.  Marland Kitchen History of liver failure    (alchoholic) Recovered in hosp  . Hypogonadism male   . Migraine headache   . Paraesophageal hernia   . Subdural hematoma ([place])   . TIA (transient ischemic attack)     Past Surgical History:  Procedure Laterality Date  . BILROTH II PROCEDURE     with gastrojejunostomy  . CARDIOVASCULAR STRESS TEST  01/30/14   Myocardial perfusion imaging: NORMAL ([name]).  . CHOLECYSTECTOMY  05/2016   Open.     . COLON RESECTION Right    right colon resection and ileocolonic anastamosis  . COLONOSCOPY N/A 07/02/2013   Procedure: COLONOSCOPY;  Surgeon: Rogene Houston, MD;  [facility];  Service: Endoscopy;  Laterality: N/A;  200  . ESOPHAGEAL DILATION N/A 10/30/2014   Procedure: ESOPHAGEAL DILATION;  Surgeon: Rogene Houston, MD;  [facility]: [facility];  Service: Endoscopy;  Laterality: N/A;  . ESOPHAGEAL DILATION N/A 02/12/2015   Procedure: ESOPHAGEAL DILATION;  Surgeon: Rogene Houston, MD;  [facility];  Service: Endoscopy;  Laterality: N/A;  . ESOPHAGEAL DILATION N/A 01/21/2016   Procedure: ESOPHAGEAL DILATION;  Surgeon: Rogene Houston, MD;  [facility];  Service: Endoscopy;  Laterality: N/A;  . ESOPHAGOGASTRODUODENOSCOPY N/A 08/14/2013   Procedure: ESOPHAGOGASTRODUODENOSCOPY (EGD);  Surgeon: Rogene Houston, MD;  [facility];  Service: Endoscopy;  Laterality: N/A;  300  . ESOPHAGOGASTRODUODENOSCOPY N/A 10/30/2014   Procedure: ESOPHAGOGASTRODUODENOSCOPY (EGD);  Surgeon: Rogene Houston, MD;  [facility];  Service: Endoscopy;  Laterality: N/A;  940  . ESOPHAGOGASTRODUODENOSCOPY N/A 02/12/2015   Procedure: ESOPHAGOGASTRODUODENOSCOPY (EGD);  Surgeon: Rogene Houston, MD;  [facility];  Service: Endoscopy;  Laterality: N/A;  105  . ESOPHAGOGASTRODUODENOSCOPY  01/21/16   gastritis/reactive gastropathy.  H pylori neg.  Bx benign.  Marland Kitchen  ESOPHAGOGASTRODUODENOSCOPY N/A 01/21/2016   Procedure: ESOPHAGOGASTRODUODENOSCOPY (EGD);  Surgeon: Rogene Houston, MD;  [facility];  Service: Endoscopy;  Laterality: N/A;  1:10 - moved to 5/26 @ 8:25  . HIATAL HERNIA REPAIR  09/2007   He underwent repair of large hiatal hernia and pyloroplasty which was complicated by pyloroplasty leak and subsequent gastrojejunostomy  . NISSEN FUNDOPLICATION    . NOCTURNAL POLYSOMNOGRAM  05/2013   Severe periodic limb movement d/o: trial of dopamine agonist  such as requip was recommended.  Marland Kitchen PYLOROPLASTY  2009  . TRANSTHORACIC ECHOCARDIOGRAM  12/2013   EF 65-70%, mild LVH, grade I DD, no valvular problems.    Outpatient Medications Prior to Visit  Medication Sig Dispense Refill  . cyclobenzaprine (FLEXERIL) 10 MG tablet Take 1 tablet (10 mg total) by mouth 3 (three) times daily as needed for muscle spasms. (Patient not taking: Reported on 10/03/2016) 60 tablet 1  . gabapentin (NEURONTIN) 300 MG capsule Take 300-600 mg by mouth 4 (four) times daily. 1 capsule in the morning, noon and evening, and 2 capsules at bedtime.    Marland Kitchen loperamide (IMODIUM) 2 MG capsule Take 2 mg by mouth as needed for diarrhea or loose stools.    Marland Kitchen loratadine (CLARITIN) 10 MG tablet Take 10 mg by mouth daily.     Marland Kitchen LORazepam (ATIVAN) 1 MG tablet Take 1 tablet (1 mg total) by mouth 2 (two) times daily. 60 tablet 2  . Multiple Vitamin (MULTIVITAMIN WITH MINERALS) TABS tablet Take 1 tablet by mouth daily.    Marland Kitchen omeprazole (PRILOSEC) 40 MG capsule Take 1 capsule (40 mg total) by mouth daily before breakfast. (Patient taking differently: Take 40 mg by mouth as needed. ) 30 capsule 5  . promethazine (PHENERGAN) 25 MG tablet Take 25 mg by mouth every 6 (six) hours as needed.    . ranitidine (ZANTAC) 150 MG tablet Take 1 tablet (150 mg total) by mouth at bedtime. (Patient taking differently: Take 150 mg by mouth 2 (two) times daily. ) 60 tablet 5  . sucralfate (CARAFATE) 1 GM/10ML suspension Take 1 g by mouth 4 (four) times daily.     . traMADol (ULTRAM) 50 MG tablet Take 1 tablet (50 mg total) by mouth 2 (two) times daily. Prn, patient is not to take no more than 2 per day and it must last 30 days. (Patient not taking: Reported on 10/03/2016) 60 tablet 0   No facility-administered medications prior to visit.     Allergies  Allergen Reactions  . Acetaminophen Other (See Comments)    Liver issues  . Norvasc [Amlodipine] Other (See Comments)    "felt bad"    ROS As per  HPI  PE: There were no vitals taken for this visit. ***  LABS:  Lab Results  Component Value Date   TSH 2.56 03/20/2016   Lab Results  Component Value Date   WBC 7.3 03/27/2016   HGB 13.8 03/27/2016   HCT 41.4 03/27/2016   MCV 98.8 03/27/2016   PLT 122.0 (L) 03/27/2016   Lab Results  Component Value Date   CREATININE 0.89 03/27/2016   BUN 10 03/27/2016   NA 129 (L) 03/27/2016   K 3.7 03/27/2016   CL 99 03/27/2016   CO2 17 (L) 03/27/2016   Lab Results  Component Value Date   ALT 49 03/27/2016   AST 89 (H) 03/27/2016   ALKPHOS 121 (H) 03/27/2016   BILITOT 2.0 (H) 03/27/2016   Lab Results  Component Value Date  CHOL 165 01/24/2014   Lab Results  Component Value Date   HDL 48 01/24/2014   Lab Results  Component Value Date   LDLCALC 90 01/24/2014   Lab Results  Component Value Date   TRIG 135 01/24/2014   Lab Results  Component Value Date   CHOLHDL 3.4 01/24/2014   Lab Results  Component Value Date   PSA 1.43 03/20/2016   Lab Results  Component Value Date   HGBA1C 5.5 03/20/2013   Lab Results  Component Value Date   TESTOSTERONE 512 03/20/2016    IMPRESSION AND PLAN:  No problem-specific Assessment & Plan notes found for this encounter.   FOLLOW UP: No Follow-up on file.

## 2016-10-10 ENCOUNTER — Encounter (HOSPITAL_COMMUNITY): Payer: Self-pay

## 2016-10-10 ENCOUNTER — Encounter (HOSPITAL_COMMUNITY)
Admission: RE | Admit: 2016-10-10 | Discharge: 2016-10-10 | Disposition: A | Discharge status: Home / Self Care | Payer: Medicare Other | Source: Ambulatory Visit | Attending: Internal Medicine | Admitting: Internal Medicine

## 2016-10-10 DIAGNOSIS — R1314 Dysphagia, pharyngoesophageal phase: Secondary | ICD-10-CM | POA: Diagnosis not present

## 2016-10-10 DIAGNOSIS — M5136 Other intervertebral disc degeneration, lumbar region: Secondary | ICD-10-CM | POA: Diagnosis not present

## 2016-10-10 DIAGNOSIS — K2210 Ulcer of esophagus without bleeding: Secondary | ICD-10-CM | POA: Diagnosis not present

## 2016-10-10 DIAGNOSIS — M5030 Other cervical disc degeneration, unspecified cervical region: Secondary | ICD-10-CM | POA: Diagnosis not present

## 2016-10-10 DIAGNOSIS — K766 Portal hypertension: Secondary | ICD-10-CM | POA: Diagnosis not present

## 2016-10-10 DIAGNOSIS — K2950 Unspecified chronic gastritis without bleeding: Secondary | ICD-10-CM | POA: Diagnosis not present

## 2016-10-10 DIAGNOSIS — Z01812 Encounter for preprocedural laboratory examination: Secondary | ICD-10-CM

## 2016-10-10 DIAGNOSIS — K222 Esophageal obstruction: Secondary | ICD-10-CM | POA: Diagnosis not present

## 2016-10-10 DIAGNOSIS — K3189 Other diseases of stomach and duodenum: Secondary | ICD-10-CM | POA: Diagnosis not present

## 2016-10-10 DIAGNOSIS — M549 Dorsalgia, unspecified: Secondary | ICD-10-CM | POA: Diagnosis not present

## 2016-10-10 DIAGNOSIS — F1020 Alcohol dependence, uncomplicated: Secondary | ICD-10-CM | POA: Diagnosis not present

## 2016-10-10 DIAGNOSIS — K7010 Alcoholic hepatitis without ascites: Secondary | ICD-10-CM | POA: Diagnosis not present

## 2016-10-10 DIAGNOSIS — K210 Gastro-esophageal reflux disease with esophagitis: Secondary | ICD-10-CM | POA: Diagnosis not present

## 2016-10-10 DIAGNOSIS — K3184 Gastroparesis: Secondary | ICD-10-CM | POA: Diagnosis not present

## 2016-10-10 DIAGNOSIS — R1310 Dysphagia, unspecified: Secondary | ICD-10-CM | POA: Diagnosis present

## 2016-10-10 DIAGNOSIS — R933 Abnormal findings on diagnostic imaging of other parts of digestive tract: Secondary | ICD-10-CM | POA: Diagnosis not present

## 2016-10-10 DIAGNOSIS — Z8673 Personal history of transient ischemic attack (TIA), and cerebral infarction without residual deficits: Secondary | ICD-10-CM | POA: Diagnosis not present

## 2016-10-10 DIAGNOSIS — G8929 Other chronic pain: Secondary | ICD-10-CM | POA: Diagnosis not present

## 2016-10-10 DIAGNOSIS — K860 Alcohol-induced chronic pancreatitis: Secondary | ICD-10-CM | POA: Diagnosis not present

## 2016-10-10 DIAGNOSIS — R1319 Other dysphagia: Secondary | ICD-10-CM

## 2016-10-10 DIAGNOSIS — F419 Anxiety disorder, unspecified: Secondary | ICD-10-CM | POA: Diagnosis not present

## 2016-10-10 DIAGNOSIS — F17210 Nicotine dependence, cigarettes, uncomplicated: Secondary | ICD-10-CM | POA: Diagnosis not present

## 2016-10-10 DIAGNOSIS — Z980 Intestinal bypass and anastomosis status: Secondary | ICD-10-CM | POA: Diagnosis not present

## 2016-10-10 DIAGNOSIS — G4730 Sleep apnea, unspecified: Secondary | ICD-10-CM | POA: Diagnosis not present

## 2016-10-10 DIAGNOSIS — F329 Major depressive disorder, single episode, unspecified: Secondary | ICD-10-CM | POA: Diagnosis not present

## 2016-10-10 DIAGNOSIS — I10 Essential (primary) hypertension: Secondary | ICD-10-CM | POA: Diagnosis not present

## 2016-10-10 DIAGNOSIS — K5090 Crohn's disease, unspecified, without complications: Secondary | ICD-10-CM | POA: Diagnosis not present

## 2016-10-10 HISTORY — DX: Cerebral infarction, unspecified: I63.9

## 2016-10-10 HISTORY — DX: Major depressive disorder, single episode, unspecified: F32.9

## 2016-10-10 HISTORY — DX: Personal history of urinary calculi: Z87.442

## 2016-10-10 HISTORY — DX: Depression, unspecified: F32.A

## 2016-10-10 LAB — CBC WITH DIFFERENTIAL/PLATELET
Basophils Absolute: 0.1 10*3/uL (ref 0.0–0.1)
Basophils Relative: 1 %
Eosinophils Absolute: 0.2 10*3/uL (ref 0.0–0.7)
Eosinophils Relative: 2 %
HCT: 31.9 % — ABNORMAL LOW (ref 39.0–52.0)
Hemoglobin: 10.5 g/dL — ABNORMAL LOW (ref 13.0–17.0)
Lymphocytes Relative: 32 %
Lymphs Abs: 3.1 10*3/uL (ref 0.7–4.0)
MCH: 28.2 pg (ref 26.0–34.0)
MCHC: 32.9 g/dL (ref 30.0–36.0)
MCV: 85.5 fL (ref 78.0–100.0)
Monocytes Absolute: 1.1 10*3/uL — ABNORMAL HIGH (ref 0.1–1.0)
Monocytes Relative: 12 %
Neutro Abs: 5.4 10*3/uL (ref 1.7–7.7)
Neutrophils Relative %: 55 %
Platelets: 164 10*3/uL (ref 150–400)
RBC: 3.73 MIL/uL — ABNORMAL LOW (ref 4.22–5.81)
RDW: 24.3 % — ABNORMAL HIGH (ref 11.5–15.5)
WBC: 9.9 10*3/uL (ref 4.0–10.5)

## 2016-10-10 LAB — BASIC METABOLIC PANEL
ANION GAP: 8 (ref 5–15)
BUN: 7 mg/dL (ref 6–20)
CALCIUM: 8.4 mg/dL — AB (ref 8.9–10.3)
CO2: 21 mmol/L — ABNORMAL LOW (ref 22–32)
CREATININE: 1.08 mg/dL (ref 0.61–1.24)
Chloride: 106 mmol/L (ref 101–111)
GFR calc non Af Amer: >60 mL/min (ref >60)
Glucose, Bld: 94 mg/dL (ref 65–99)
Potassium: 3.9 mmol/L (ref 3.5–5.1)
SODIUM: 135 mmol/L (ref 135–145)

## 2016-10-10 NOTE — Patient Instructions (Signed)
Gary Vasquez  10/10/2016     @PREFPERIOPPHARMACY @   Your procedure is scheduled on  10/13/2016  Report to [facility] at  745  A.M.  Call this number if you have problems the morning of surgery:  [phone]   Remember:  Do not eat food or drink liquids after midnight.  Take these medicines the morning of surgery with A SIP OF WATER  Neurontin, ativan, prilosec, claritin, phenergan, zantac.   Do not wear jewelry, make-up or nail polish.  Do not wear lotions, powders, or perfumes, or deoderant.  Do not shave 48 hours prior to surgery.  Men may shave face and neck.  Do not bring valuables to the hospital.  [facility] is not responsible for any belongings or valuables.  Contacts, dentures or bridgework may not be worn into surgery.  Leave your suitcase in the car.  After surgery it may be brought to your room.  For patients admitted to the hospital, discharge time will be determined by your treatment team.  Patients discharged the day of surgery will not be allowed to drive home.   Name and phone number of your driver:   daughter Special instructions:  Follow the diet instructions given to you by Dr Olevia Perches office.  Please read over the following fact sheets that you were given. Anesthesia Post-op Instructions and Care and Recovery After Surgery       Esophagogastroduodenoscopy Introduction Esophagogastroduodenoscopy (EGD) is a procedure to examine the lining of the esophagus, stomach, and first part of the small intestine (duodenum). This procedure is done to check for problems such as inflammation, bleeding, ulcers, or growths. During this procedure, a long, flexible, lighted tube with a camera attached (endoscope) is inserted down the throat. Tell a health care provider about:  Any allergies you have.  All medicines you are taking, including vitamins, herbs, eye drops, creams, and over-the-counter medicines.  Any problems you or family members  have had with anesthetic medicines.  Any blood disorders you have.  Any surgeries you have had.  Any medical conditions you have.  Whether you are pregnant or may be pregnant. What are the risks? Generally, this is a safe procedure. However, problems may occur, including:  Infection.  Bleeding.  A tear (perforation) in the esophagus, stomach, or duodenum.  Trouble breathing.  Excessive sweating.  Spasms of the larynx.  A slowed heartbeat.  Low blood pressure. What happens before the procedure?  Follow instructions from your health care provider about eating or drinking restrictions.  Ask your health care provider about:  Changing or stopping your regular medicines. This is especially important if you are taking diabetes medicines or blood thinners.  Taking medicines such as aspirin and ibuprofen. These medicines can thin your blood. Do not take these medicines before your procedure if your health care provider instructs you not to.  Plan to have someone take you home after the procedure.  If you wear dentures, be ready to remove them before the procedure. What happens during the procedure?  To reduce your risk of infection, your health care team will wash or sanitize their hands.  An IV tube will be put in a vein in your hand or arm. You will get medicines and fluids through this tube.  You will be given one or more of the following:  A medicine to help you relax (sedative).  A medicine to numb the area (local anesthetic). This medicine  may be sprayed into your throat. It will make you feel more comfortable and keep you from gagging or coughing during the procedure.  A medicine for pain.  A mouth guard may be placed in your mouth to protect your teeth and to keep you from biting on the endoscope.  You will be asked to lie on your left side.  The endoscope will be lowered down your throat into your esophagus, stomach, and duodenum.  Air will be put into the  endoscope. This will help your health care provider see better.  The lining of your esophagus, stomach, and duodenum will be examined.  Your health care provider may:  Take a tissue sample so it can be looked at in a lab (biopsy).  Remove growths.  Remove objects (foreign bodies) that are stuck.  Treat any bleeding with medicines or other devices that stop tissue from bleeding.  Widen (dilate) or stretch narrowed areas of your esophagus and stomach.  The endoscope will be taken out. The procedure may vary among health care providers and hospitals. What happens after the procedure?  Your blood pressure, heart rate, breathing rate, and blood oxygen level will be monitored often until the medicines you were given have worn off.  Do not eat or drink anything until the numbing medicine has worn off and your gag reflex has returned. This information is not intended to replace advice given to you by your health care provider. Make sure you discuss any questions you have with your health care provider. Document Released: 12/15/2004 Document Revised: 01/20/2016 Document Reviewed: 07/08/2015  2017 Elsevier Esophagogastroduodenoscopy, Care After Introduction Refer to this sheet in the next few weeks. These instructions provide you with information about caring for yourself after your procedure. Your health care provider may also give you more specific instructions. Your treatment has been planned according to current medical practices, but problems sometimes occur. Call your health care provider if you have any problems or questions after your procedure. What can I expect after the procedure? After the procedure, it is common to have:  A sore throat.  Nausea.  Bloating.  Dizziness.  Fatigue. Follow these instructions at home:  Do not eat or drink anything until the numbing medicine (local anesthetic) has worn off and your gag reflex has returned. You will know that the local  anesthetic has worn off when you can swallow comfortably.  Do not drive for 24 hours if you received a medicine to help you relax (sedative).  If your health care provider took a tissue sample for testing during the procedure, make sure to get your test results. This is your responsibility. Ask your health care provider or the department performing the test when your results will be ready.  Keep all follow-up visits as told by your health care provider. This is important. Contact a health care provider if:  You cannot stop coughing.  You are not urinating.  You are urinating less than usual. Get help right away if:  You have trouble swallowing.  You cannot eat or drink.  You have throat or chest pain that gets worse.  You are dizzy or light-headed.  You faint.  You have nausea or vomiting.  You have chills.  You have a fever.  You have severe abdominal pain.  You have black, tarry, or bloody stools. This information is not intended to replace advice given to you by your health care provider. Make sure you discuss any questions you have with your health care provider.  Document Released: 07/31/2012 Document Revised: 01/20/2016 Document Reviewed: 07/08/2015  2017 Elsevier  Esophageal Dilatation Esophageal dilatation is a procedure to open a blocked or narrowed part of the esophagus. The esophagus is the long tube in your throat that carries food and liquid from your mouth to your stomach. The procedure is also called esophageal dilation. You may need this procedure if you have a buildup of scar tissue in your esophagus that makes it difficult, painful, or even impossible to swallow. This can be caused by gastroesophageal reflux disease (GERD). In rare cases, people need this procedure because they have cancer of the esophagus or a problem with the way food moves through the esophagus. Sometimes you may need to have another dilatation to enlarge the opening of the esophagus  gradually. Tell a health care provider about:  Any allergies you have.  All medicines you are taking, including vitamins, herbs, eye drops, creams, and over-the-counter medicines.  Any problems you or family members have had with anesthetic medicines.  Any blood disorders you have.  Any surgeries you have had.  Any medical conditions you have.  Any antibiotic medicines you are required to take before dental procedures. What are the risks? Generally, this is a safe procedure. However, problems can occur and include:  Bleeding from a tear in the lining of the esophagus.  A hole (perforation) in the esophagus. What happens before the procedure?  Do not eat or drink anything after midnight on the night before the procedure or as directed by your health care provider.  Ask your health care provider about changing or stopping your regular medicines. This is especially important if you are taking diabetes medicines or blood thinners.  Plan to have someone take you home after the procedure. What happens during the procedure?  You will be given a medicine that makes you relaxed and sleepy (sedative).  A medicine may be sprayed or gargled to numb the back of the throat.  Your health care provider can use various instruments to do an esophageal dilatation. During the procedure, the instrument used will be placed in your mouth and passed down into your esophagus. Options include:  Simple dilators. This instrument is carefully placed in the esophagus to stretch it.  Guided wire bougies. In this method, a flexible tube (endoscope) is used to insert a wire into the esophagus. The dilator is passed over this wire to enlarge the esophagus. Then the wire is removed.  Balloon dilators. An endoscope with a small balloon at the end is passed down into the esophagus. Inflating the balloon gently stretches the esophagus and opens it up. What happens after the procedure?  Your blood pressure,  heart rate, breathing rate, and blood oxygen level will be monitored often until the medicines you were given have worn off.  Your throat may feel slightly sore and will probably still feel numb. This will improve slowly over time.  You will not be allowed to eat or drink until the throat numbness has resolved.  If this is a same-day procedure, you may be allowed to go home once you have been able to drink, urinate, and sit on the edge of the bed without nausea or dizziness.  If this is a same-day procedure, you should have a friend or family member with you for the next 24 hours after the procedure. This information is not intended to replace advice given to you by your health care provider. Make sure you discuss any questions you have with your health  care provider. Document Released: 10/05/2005 Document Revised: 01/20/2016 Document Reviewed: 12/24/2013 Elsevier Interactive Patient Education  2017 [place] Anesthesia is a term that refers to techniques, procedures, and medicines that help a person stay safe and comfortable during a medical procedure. Monitored anesthesia care, or sedation, is one type of anesthesia. Your anesthesia specialist may recommend sedation if you will be having a procedure that does not require you to be unconscious, such as:  Cataract surgery.  A dental procedure.  A biopsy.  A colonoscopy. During the procedure, you may receive a medicine to help you relax (sedative). There are three levels of sedation:  Mild sedation. At this level, you may feel awake and relaxed. You will be able to follow directions.  Moderate sedation. At this level, you will be sleepy. You may not remember the procedure.  Deep sedation. At this level, you will be asleep. You will not remember the procedure. The more medicine you are given, the deeper your level of sedation will be. Depending on how you respond to the procedure, the anesthesia specialist  may change your level of sedation or the type of anesthesia to fit your needs. An anesthesia specialist will monitor you closely during the procedure. Let your health care provider know about:  Any allergies you have.  All medicines you are taking, including vitamins, herbs, eye drops, creams, and over-the-counter medicines.  Any use of steroids (by mouth or as a cream).  Any problems you or family members have had with sedatives and anesthetic medicines.  Any blood disorders you have.  Any surgeries you have had.  Any medical conditions you have, such as sleep apnea.  Whether you are pregnant or may be pregnant.  Any use of cigarettes, alcohol, or street drugs. What are the risks? Generally, this is a safe procedure. However, problems may occur, including:  Getting too much medicine (oversedation).  Nausea.  Allergic reaction to medicines.  Trouble breathing. If this happens, a breathing tube may be used to help with breathing. It will be removed when you are awake and breathing on your own.  Heart trouble.  Lung trouble. Before the procedure Staying hydrated  Follow instructions from your health care provider about hydration, which may include:  Up to 2 hours before the procedure - you may continue to drink clear liquids, such as water, clear fruit juice, black coffee, and plain tea. Eating and drinking restrictions  Follow instructions from your health care provider about eating and drinking, which may include:  8 hours before the procedure - stop eating heavy meals or foods such as meat, fried foods, or fatty foods.  6 hours before the procedure - stop eating light meals or foods, such as toast or cereal.  6 hours before the procedure - stop drinking milk or drinks that contain milk.  2 hours before the procedure - stop drinking clear liquids. Medicines  Ask your health care provider about:  Changing or stopping your regular medicines. This is especially  important if you are taking diabetes medicines or blood thinners.  Taking medicines such as aspirin and ibuprofen. These medicines can thin your blood. Do not take these medicines before your procedure if your health care provider instructs you not to. Tests and exams  You will have a physical exam.  You may have blood tests done to show:  How well your kidneys and liver are working.  How well your blood can clot.  General instructions  Plan to have someone  take you home from the hospital or clinic.  If you will be going home right after the procedure, plan to have someone with you for 24 hours. What happens during the procedure?  Your blood pressure, heart rate, breathing, level of pain and overall condition will be monitored.  An IV tube will be inserted into one of your veins.  Your anesthesia specialist will give you medicines as needed to keep you comfortable during the procedure. This may mean changing the level of sedation.  The procedure will be performed. After the procedure  Your blood pressure, heart rate, breathing rate, and blood oxygen level will be monitored until the medicines you were given have worn off.  Do not drive for 24 hours if you received a sedative.  You may:  Feel sleepy, clumsy, or nauseous.  Feel forgetful about what happened after the procedure.  Have a sore throat if you had a breathing tube during the procedure.  Vomit. This information is not intended to replace advice given to you by your health care provider. Make sure you discuss any questions you have with your health care provider. Document Released: 05/10/2005 Document Revised: 01/21/2016 Document Reviewed: 12/05/2015 Elsevier Interactive Patient Education  2017 [place], Care After These instructions provide you with information about caring for yourself after your procedure. Your health care provider may also give you more specific instructions.  Your treatment has been planned according to current medical practices, but problems sometimes occur. Call your health care provider if you have any problems or questions after your procedure. What can I expect after the procedure? After your procedure, it is common to:  Feel sleepy for several hours.  Feel clumsy and have poor balance for several hours.  Feel forgetful about what happened after the procedure.  Have poor judgment for several hours.  Feel nauseous or vomit.  Have a sore throat if you had a breathing tube during the procedure. Follow these instructions at home: For at least 24 hours after the procedure:   Do not:  Participate in activities in which you could fall or become injured.  Drive.  Use heavy machinery.  Drink alcohol.  Take sleeping pills or medicines that cause drowsiness.  Make important decisions or sign legal documents.  Take care of children on your own.  Rest. Eating and drinking  Follow the diet that is recommended by your health care provider.  If you vomit, drink water, juice, or soup when you can drink without vomiting.  Make sure you have little or no nausea before eating solid foods. General instructions  Have a responsible adult stay with you until you are awake and alert.  Take over-the-counter and prescription medicines only as told by your health care provider.  If you smoke, do not smoke without supervision.  Keep all follow-up visits as told by your health care provider. This is important. Contact a health care provider if:  You keep feeling nauseous or you keep vomiting.  You feel light-headed.  You develop a rash.  You have a fever. Get help right away if:  You have trouble breathing. This information is not intended to replace advice given to you by your health care provider. Make sure you discuss any questions you have with your health care provider. Document Released: 12/05/2015 Document Revised: 04/05/2016  Document Reviewed: 12/05/2015 Elsevier Interactive Patient Education  2017 [employer].

## 2016-10-11 ENCOUNTER — Inpatient Hospital Stay (HOSPITAL_COMMUNITY): Admission: RE | Admit: 2016-10-11 | Payer: Self-pay | Source: Ambulatory Visit

## 2016-10-12 ENCOUNTER — Telehealth (INDEPENDENT_AMBULATORY_CARE_PROVIDER_SITE_OTHER): Payer: Self-pay | Admitting: Internal Medicine

## 2016-10-12 ENCOUNTER — Encounter (INDEPENDENT_AMBULATORY_CARE_PROVIDER_SITE_OTHER): Payer: Self-pay | Admitting: *Deleted

## 2016-10-12 NOTE — Telephone Encounter (Signed)
Call returned. No answer. Message on patient's daughter Meredith's phone. He will have to be taken to ER if he develops symptoms of alcohol withdrawal. He is scheduled for EGD with EGD in a.m.

## 2016-10-13 ENCOUNTER — Ambulatory Visit (HOSPITAL_COMMUNITY)
Admission: RE | Admit: 2016-10-13 | Discharge: 2016-10-13 | Disposition: A | Discharge status: Home / Self Care | Payer: Medicare Other | Source: Ambulatory Visit | Attending: Internal Medicine | Admitting: Internal Medicine

## 2016-10-13 ENCOUNTER — Encounter (HOSPITAL_COMMUNITY)
Admission: RE | Disposition: A | Discharge status: Home / Self Care | Payer: Self-pay | Source: Ambulatory Visit | Attending: Internal Medicine

## 2016-10-13 ENCOUNTER — Ambulatory Visit (HOSPITAL_COMMUNITY): Payer: Medicare Other | Admitting: Anesthesiology

## 2016-10-13 ENCOUNTER — Encounter (HOSPITAL_COMMUNITY): Payer: Self-pay

## 2016-10-13 DIAGNOSIS — R1314 Dysphagia, pharyngoesophageal phase: Secondary | ICD-10-CM | POA: Insufficient documentation

## 2016-10-13 DIAGNOSIS — G8929 Other chronic pain: Secondary | ICD-10-CM | POA: Insufficient documentation

## 2016-10-13 DIAGNOSIS — K222 Esophageal obstruction: Secondary | ICD-10-CM | POA: Insufficient documentation

## 2016-10-13 DIAGNOSIS — K2210 Ulcer of esophagus without bleeding: Secondary | ICD-10-CM | POA: Insufficient documentation

## 2016-10-13 DIAGNOSIS — K2950 Unspecified chronic gastritis without bleeding: Secondary | ICD-10-CM | POA: Insufficient documentation

## 2016-10-13 DIAGNOSIS — M5136 Other intervertebral disc degeneration, lumbar region: Secondary | ICD-10-CM | POA: Insufficient documentation

## 2016-10-13 DIAGNOSIS — R1319 Other dysphagia: Secondary | ICD-10-CM | POA: Insufficient documentation

## 2016-10-13 DIAGNOSIS — K210 Gastro-esophageal reflux disease with esophagitis: Secondary | ICD-10-CM | POA: Diagnosis not present

## 2016-10-13 DIAGNOSIS — R933 Abnormal findings on diagnostic imaging of other parts of digestive tract: Secondary | ICD-10-CM | POA: Insufficient documentation

## 2016-10-13 DIAGNOSIS — Z9049 Acquired absence of other specified parts of digestive tract: Secondary | ICD-10-CM | POA: Insufficient documentation

## 2016-10-13 DIAGNOSIS — I10 Essential (primary) hypertension: Secondary | ICD-10-CM | POA: Insufficient documentation

## 2016-10-13 DIAGNOSIS — K3189 Other diseases of stomach and duodenum: Secondary | ICD-10-CM | POA: Diagnosis not present

## 2016-10-13 DIAGNOSIS — Z8249 Family history of ischemic heart disease and other diseases of the circulatory system: Secondary | ICD-10-CM | POA: Insufficient documentation

## 2016-10-13 DIAGNOSIS — Z980 Intestinal bypass and anastomosis status: Secondary | ICD-10-CM | POA: Insufficient documentation

## 2016-10-13 DIAGNOSIS — M549 Dorsalgia, unspecified: Secondary | ICD-10-CM | POA: Insufficient documentation

## 2016-10-13 DIAGNOSIS — G4730 Sleep apnea, unspecified: Secondary | ICD-10-CM | POA: Insufficient documentation

## 2016-10-13 DIAGNOSIS — Z823 Family history of stroke: Secondary | ICD-10-CM | POA: Insufficient documentation

## 2016-10-13 DIAGNOSIS — K3184 Gastroparesis: Secondary | ICD-10-CM | POA: Insufficient documentation

## 2016-10-13 DIAGNOSIS — M5030 Other cervical disc degeneration, unspecified cervical region: Secondary | ICD-10-CM | POA: Insufficient documentation

## 2016-10-13 DIAGNOSIS — Z79899 Other long term (current) drug therapy: Secondary | ICD-10-CM | POA: Insufficient documentation

## 2016-10-13 DIAGNOSIS — F1020 Alcohol dependence, uncomplicated: Secondary | ICD-10-CM | POA: Insufficient documentation

## 2016-10-13 DIAGNOSIS — K766 Portal hypertension: Secondary | ICD-10-CM | POA: Diagnosis not present

## 2016-10-13 DIAGNOSIS — F419 Anxiety disorder, unspecified: Secondary | ICD-10-CM | POA: Insufficient documentation

## 2016-10-13 DIAGNOSIS — Z888 Allergy status to other drugs, medicaments and biological substances status: Secondary | ICD-10-CM | POA: Insufficient documentation

## 2016-10-13 DIAGNOSIS — K860 Alcohol-induced chronic pancreatitis: Secondary | ICD-10-CM | POA: Insufficient documentation

## 2016-10-13 DIAGNOSIS — K5090 Crohn's disease, unspecified, without complications: Secondary | ICD-10-CM | POA: Insufficient documentation

## 2016-10-13 DIAGNOSIS — F17210 Nicotine dependence, cigarettes, uncomplicated: Secondary | ICD-10-CM | POA: Insufficient documentation

## 2016-10-13 DIAGNOSIS — F329 Major depressive disorder, single episode, unspecified: Secondary | ICD-10-CM | POA: Insufficient documentation

## 2016-10-13 DIAGNOSIS — Z87442 Personal history of urinary calculi: Secondary | ICD-10-CM | POA: Insufficient documentation

## 2016-10-13 DIAGNOSIS — K7010 Alcoholic hepatitis without ascites: Secondary | ICD-10-CM | POA: Insufficient documentation

## 2016-10-13 DIAGNOSIS — Z8673 Personal history of transient ischemic attack (TIA), and cerebral infarction without residual deficits: Secondary | ICD-10-CM | POA: Insufficient documentation

## 2016-10-13 HISTORY — PX: ESOPHAGOGASTRODUODENOSCOPY (EGD) WITH PROPOFOL: SHX5813

## 2016-10-13 HISTORY — PX: ESOPHAGOGASTRODUODENOSCOPY: SHX1529

## 2016-10-13 HISTORY — PX: ESOPHAGEAL DILATION: SHX303

## 2016-10-13 SURGERY — ESOPHAGOGASTRODUODENOSCOPY (EGD) WITH PROPOFOL
Anesthesia: Monitor Anesthesia Care

## 2016-10-13 MED ORDER — CHLORHEXIDINE GLUCONATE CLOTH 2 % EX PADS: 6.0000 | MEDICATED_PAD | Freq: Once | CUTANEOUS | Status: DC

## 2016-10-13 MED ORDER — ONDANSETRON HCL 4 MG PO TABS: 4.0000 mg | ORAL_TABLET | Freq: Two times a day (BID) | ORAL | 1 refills | Status: DC | PRN

## 2016-10-13 MED ORDER — MIDAZOLAM HCL 2 MG/2ML IJ SOLN
1.0000 mg | INTRAMUSCULAR | Status: AC
  Administered 2016-10-13: 2 mg via INTRAVENOUS

## 2016-10-13 MED ORDER — LIDOCAINE VISCOUS 2 % MT SOLN
15.0000 mL | Freq: Once | OROMUCOSAL | Status: AC
  Administered 2016-10-13: 15 mL via OROMUCOSAL

## 2016-10-13 MED ORDER — FENTANYL CITRATE (PF) 100 MCG/2ML IJ SOLN
25.0000 ug | Freq: Once | INTRAMUSCULAR | Status: AC
  Administered 2016-10-13: 25 ug via INTRAVENOUS

## 2016-10-13 MED ORDER — PROPOFOL 500 MG/50ML IV EMUL
INTRAVENOUS | Status: DC | PRN
  Administered 2016-10-13: 100 ug/kg/min via INTRAVENOUS

## 2016-10-13 MED ORDER — FENTANYL CITRATE (PF) 100 MCG/2ML IJ SOLN
INTRAMUSCULAR | Status: AC
  Filled 2016-10-13: qty 2

## 2016-10-13 MED ORDER — LACTATED RINGERS IV SOLN
INTRAVENOUS | Status: DC
  Administered 2016-10-13: 09:00:00 via INTRAVENOUS

## 2016-10-13 MED ORDER — LIDOCAINE HCL (CARDIAC) 10 MG/ML IV SOLN
INTRAVENOUS | Status: DC | PRN
  Administered 2016-10-13: 50 mg via INTRAVENOUS

## 2016-10-13 MED ORDER — LIDOCAINE VISCOUS 2 % MT SOLN
OROMUCOSAL | Status: AC
  Filled 2016-10-13: qty 15

## 2016-10-13 MED ORDER — PROPOFOL 10 MG/ML IV BOLUS
INTRAVENOUS | Status: DC | PRN
  Administered 2016-10-13: 20 mg via INTRAVENOUS

## 2016-10-13 MED ORDER — MIDAZOLAM HCL 2 MG/2ML IJ SOLN
INTRAMUSCULAR | Status: AC
  Filled 2016-10-13: qty 2

## 2016-10-13 NOTE — Anesthesia Procedure Notes (Signed)
Procedure Name: MAC Date/Time: 10/13/2016 9:24 AM Performed by: Vista Deck Pre-anesthesia Checklist: Patient identified, Emergency Drugs available, Suction available, Timeout performed and Patient being monitored Patient Re-evaluated:Patient Re-evaluated prior to inductionOxygen Delivery Method: Non-rebreather mask

## 2016-10-13 NOTE — Anesthesia Preprocedure Evaluation (Signed)
Anesthesia Evaluation  Patient identified by MRN, date of birth, ID band Patient awake    Reviewed: Allergy & Precautions, NPO status , Patient's Chart, lab work & pertinent test results  Airway Mallampati: II  TM Distance: >3 FB Neck ROM: Full    Dental  (+) Poor Dentition, Implants, Dental Advisory Given,    Pulmonary sleep apnea , COPD, Current Smoker,    breath sounds clear to auscultation       Cardiovascular hypertension, Pt. on medications  Rhythm:Regular Rate:Normal     Neuro/Psych  Headaches, PSYCHIATRIC DISORDERS Anxiety Depression Memory loss issues. TIACVA    GI/Hepatic GERD  ,(+) Cirrhosis     substance abuse (active alcoholism)  alcohol use, Hepatitis -ETOH abuse with liver disease   Endo/Other    Renal/GU      Musculoskeletal   Abdominal   Peds  Hematology   Anesthesia Other Findings   Reproductive/Obstetrics                             Anesthesia Physical Anesthesia Plan  ASA: IV  Anesthesia Plan: MAC   Post-op Pain Management:    Induction: Intravenous  Airway Management Planned: Simple Face Mask  Additional Equipment:   Intra-op Plan:   Post-operative Plan:   Informed Consent: I have reviewed the patients History and Physical, chart, labs and discussed the procedure including the risks, benefits and alternatives for the proposed anesthesia with the patient or authorized representative who has indicated his/her understanding and acceptance.     Plan Discussed with:   Anesthesia Plan Comments:         Anesthesia Quick Evaluation

## 2016-10-13 NOTE — Transfer of Care (Signed)
Immediate Anesthesia Transfer of Care Note  Patient: Gary Vasquez  Procedure(s) Performed: Procedure(s) with comments: ESOPHAGOGASTRODUODENOSCOPY (EGD) WITH PROPOFOL (N/A) - 9:15 ESOPHAGEAL DILATION (N/A)  Patient Location: PACU  Anesthesia Type:MAC  Level of Consciousness: sedated and patient cooperative  Airway & Oxygen Therapy: Patient Spontanous Breathing  Post-op Assessment: Report given to RN and Post -op Vital signs reviewed and stable  Post vital signs: Reviewed  Last Vitals:  Vitals:   10/13/16 0920 10/13/16 0925  BP:    Pulse:    Resp: 18 14  Temp:      Last Pain: There were no vitals filed for this visit.    Patients Stated Pain Goal: 5 (09/81/19 1478)  Complications: No apparent anesthesia complications

## 2016-10-13 NOTE — Op Note (Signed)
[facility] Patient Name: Gary Vasquez Procedure Date: 10/13/2016 8:52 AM MRN: [id number] Date of Birth: February 21, 1951 Attending MD: Hildred Laser , MD CSN: [id number] Age: 66 Admit Type: Outpatient Procedure:                Upper GI endoscopy Indications:              Esophageal dysphagia, Abnormal UGI series Providers:                Hildred Laser, MD, Charlyne Petrin RN, RN, Aram Candela Referring MD:             Tammi Sou, MD Medicines:                Cetacaine spray, Propofol per Anesthesia Complications:            No immediate complications. Estimated Blood Loss:     Estimated blood loss was minimal. Procedure:                Pre-Anesthesia Assessment:                           - Prior to the procedure, a History and Physical                            was performed, and patient medications and                            allergies were reviewed. The patient's tolerance of                            previous anesthesia was also reviewed. The risks                            and benefits of the procedure and the sedation                            options and risks were discussed with the patient.                            All questions were answered, and informed consent                            was obtained. Prior Anticoagulants: The patient has                            taken no previous anticoagulant or antiplatelet                            agents. ASA Grade Assessment: III - A patient with                            severe systemic disease. After reviewing the risks  and benefits, the patient was deemed in                            satisfactory condition to undergo the procedure.                           After obtaining informed consent, the endoscope was                            passed under direct vision. Throughout the                            procedure, the patient's blood pressure, pulse, and                             oxygen saturations were monitored continuously. The                            EG-[id number] ([id number]) scope was introduced through the                            mouth, and advanced to the mid-jejunum. The upper                            GI endoscopy was accomplished without difficulty.                            The patient tolerated the procedure well. Scope In: 9:32:47 AM Scope Out: 9:47:35 AM Total Procedure Duration: 0 hours 14 minutes 48 seconds  Findings:      The proximal esophagus and mid esophagus were normal.      LA Grade A (one or more mucosal breaks less than 5 mm, not extending       between tops of 2 mucosal folds) esophagitis was found 37 to 44 cm from       the incisors.      One moderate benign-appearing, intrinsic stenosis was found 44 cm from       the incisors. This measured 1.2 cm (inner diameter) x less than one cm       (in length) and was traversed after dilation. A TTS dilator was passed       through the scope. Dilation with a 15-16.5-18 mm balloon dilator was       performed to 15 mm, 16.5 mm and 18 mm. The dilation site was examined       and showed complete resolution of luminal narrowing.      Evidence of a patent Billroth II gastrojejunostomy was found. The       gastrojejunal anastomosis was characterized by healthy appearing mucosa.       This was traversed. The efferent limb was examined 40 cm from       anastomosis and was characterized by healthy appearing mucosa. The       afferent limb was examined 14 cm from anastomosis and was characterized       by healthy appearing mucosa.      Severe, diffuse portal hypertensive gastropathy was found in the cardia,       in  the gastric fundus and in the gastric body. Biopsies were taken with       a cold forceps for histology. Impression:               - Normal proximal esophagus and mid esophagus.                           - LA Grade A reflux esophagitis.                           -  Benign-appearing esophageal stenosis at GEJ(44                            cm). Dilated.                           - Patent Billroth II gastrojejunostomy was found,                            characterized by healthy appearing mucosa.                           - Portal hypertensive gastropathy. Biopsied.                           - Normal afferent and efferent loops. Moderate Sedation:      Per Anesthesia Care Recommendation:           - Patient has a contact number available for                            emergencies. The signs and symptoms of potential                            delayed complications were discussed with the                            patient. Return to normal activities tomorrow.                            Written discharge instructions were provided to the                            patient.                           - Six small meals daily today.                           - Continue present medications.                           - No aspirin, ibuprofen, naproxen, or other                            non-steroidal anti-inflammatory drugs.                           -  Ondansetron 4 mg by mouth twice a day when                            necessary.                           - Await pathology results. Procedure Code(s):        --- Professional ---                           [phone], Esophagogastroduodenoscopy, flexible,                            transoral; with transendoscopic balloon dilation of                            esophagus (less than 30 mm diameter)                           43239, Esophagogastroduodenoscopy, flexible,                            transoral; with biopsy, single or multiple Diagnosis Code(s):        --- Professional ---                           K21.0, Gastro-esophageal reflux disease with                            esophagitis                           K22.2, Esophageal obstruction                           Z98.0, Intestinal bypass and anastomosis  status                           K76.6, Portal hypertension                           K31.89, Other diseases of stomach and duodenum                           R13.14, Dysphagia, pharyngoesophageal phase                           R93.3, Abnormal findings on diagnostic imaging of                            other parts of digestive tract CPT copyright 2016 American Medical Association. All rights reserved. The codes documented in this report are preliminary and upon coder review may  be revised to meet current compliance requirements. Hildred Laser, MD Hildred Laser, MD 10/13/2016 10:10:36 AM This report has been signed electronically. Number of Addenda: 0

## 2016-10-13 NOTE — Anesthesia Postprocedure Evaluation (Signed)
Anesthesia Post Note  Patient: Gary Vasquez  Procedure(s) Performed: Procedure(s) (LRB): ESOPHAGOGASTRODUODENOSCOPY (EGD) WITH PROPOFOL (N/A) ESOPHAGEAL DILATION (N/A)  Patient location during evaluation: PACU Anesthesia Type: MAC Level of consciousness: awake Pain management: satisfactory to patient Vital Signs Assessment: post-procedure vital signs reviewed and stable Respiratory status: spontaneous breathing Cardiovascular status: stable Anesthetic complications: no     Last Vitals:  Vitals:   10/13/16 0925 10/13/16 1002  BP:  (!) 82/62  Pulse:  89  Resp: 14 16  Temp:      Last Pain: There were no vitals filed for this visit.               Drucie Opitz

## 2016-10-13 NOTE — H&P (Signed)
Gary Vasquez is an 66 y.o. male.   Chief Complaint: Patient is here for EGD and ED. HPI: Patient is 66 year old Caucasian male with multiple medical problems including complicated GI history who presents with solid food dysphagia. He has history of esophageal stricture. He underwent barium study last week suggesting distal esophageal stricture. He is therefore undergoing EGD with ED. Patient continues to drink alcohol. He had cholecystectomy in October 2017 and developed DTs. He is not having hematemesis or melena. He has chronic nausea and vomiting L to be due to bile reflux and continued alcohol use.  Past Medical History:  Diagnosis Date  . Alcoholic hepatitis       . Alcoholic pancreatitis   . Alcoholism ([place])    Still drinking as of 09/2016      . Anxiety   . Anxiety and depression   . Arthritis   . Aspiration pneumonitis ([facility])    recurrent  . Bile reflux esophagitis    Chronic; due to gastrojejunostomy.  He was evaluated by Dr. Demetrio Lapping of [facility] last year for Roux-en-Y procedure. He decided not to proceed with surgery because of high risk. Metoclopramide resulted in diarrhea and dominant and domperodone was ineffective.   . Chronic back pain   . Chronic diarrhea   . Crohn disease ([place])    in remission since approx 1995  . DDD (degenerative disc disease), cervical 02/2013   MRI by Dr. Joneen Caraway  . DDD (degenerative disc disease), lumbar    L > R lumbar radiculopathy  . Depression   . Dumping syndrome   . Encephalomalacia on imaging study 02/2013   MRI brain w/o contrast: Mild progression of left occipital lobe encephalomalacia and  . Essential hypertension, benign   . Fatty liver 02/2013; 03/2016   Abd u/s.  Also noted on CT's of abdomen.  . Gastroparesis   . GERD (gastroesophageal reflux disease)   . History of esophageal stricture    Recurrent--at GE junction.  Has required multiple dilation procedures.  Esophagrams have shown normal swallowing function.  Marland Kitchen History of  kidney stones   . History of liver failure    (alchoholic) Recovered in hosp  . Hypogonadism male   . Migraine headache   . Paraesophageal hernia   . Sleep apnea    cannot tolerate CPAP  . Stroke [facility])    short term memory loss.  . 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 TIA (transient ischemic attack)     Past Surgical History:  Procedure Laterality Date  . BILROTH II PROCEDURE     with gastrojejunostomy  . CARDIOVASCULAR STRESS TEST  01/30/14   Myocardial perfusion imaging: NORMAL ([name]).  . CHOLECYSTECTOMY  05/2016   Open.    . COLON RESECTION Right    right colon resection and ileocolonic anastamosis  . COLONOSCOPY N/A 07/02/2013   Procedure: COLONOSCOPY;  Surgeon: Rogene Houston, MD;  [facility];  Service: Endoscopy;  Laterality: N/A;  200  . ESOPHAGEAL DILATION N/A 10/30/2014   Procedure: ESOPHAGEAL DILATION;  Surgeon: Rogene Houston, MD;  [facility];  Service: Endoscopy;  Laterality: N/A;  . ESOPHAGEAL DILATION N/A 02/12/2015   Procedure: ESOPHAGEAL DILATION;  Surgeon: Rogene Houston, MD;  [facility];  Service: Endoscopy;  Laterality: N/A;  . ESOPHAGEAL DILATION N/A 01/21/2016   Procedure: ESOPHAGEAL DILATION;  Surgeon: Rogene Houston, MD;  [facility];  Service: Endoscopy;  Laterality: N/A;  . ESOPHAGOGASTRODUODENOSCOPY N/A 08/14/2013  Procedure: ESOPHAGOGASTRODUODENOSCOPY (EGD);  Surgeon: Rogene Houston, MD;  [facility];  Service: Endoscopy;  Laterality: N/A;  300  . ESOPHAGOGASTRODUODENOSCOPY N/A 10/30/2014   Procedure: ESOPHAGOGASTRODUODENOSCOPY (EGD);  Surgeon: Rogene Houston, MD;  [facility];  Service: Endoscopy;  Laterality: N/A;  940  . ESOPHAGOGASTRODUODENOSCOPY N/A 02/12/2015   Procedure: ESOPHAGOGASTRODUODENOSCOPY (EGD);  Surgeon: Rogene Houston, MD;  [facility];  Service: Endoscopy;  Laterality: N/A;  105  . ESOPHAGOGASTRODUODENOSCOPY  01/21/16   gastritis/reactive gastropathy.   H pylori neg.  Bx benign.  . ESOPHAGOGASTRODUODENOSCOPY N/A 01/21/2016   Procedure: ESOPHAGOGASTRODUODENOSCOPY (EGD);  Surgeon: Rogene Houston, MD;  [facility];  Service: Endoscopy;  Laterality: N/A;  1:10 - moved to 5/26 @ 8:25  . HIATAL HERNIA REPAIR  09/2007   He underwent repair of large hiatal hernia and pyloroplasty which was complicated by pyloroplasty leak and subsequent gastrojejunostomy  . NISSEN FUNDOPLICATION     x2  . NOCTURNAL POLYSOMNOGRAM  05/2013   Severe periodic limb movement d/o: trial of dopamine agonist such as requip was recommended.  Marland Kitchen PYLOROPLASTY  2009  . TRANSTHORACIC ECHOCARDIOGRAM  12/2013   EF 65-70%, mild LVH, grade I DD, no valvular problems.    Family History  Problem Relation Age of Onset  . Healthy Daughter   . Healthy Son   . Heart attack Mother     CABG  . Stroke Mother   . Heart attack Father     CABG  . Heart attack Brother     CABG   Social History:  reports that he has been smoking Cigarettes.  He has a 6.25 pack-year smoking history. He has never used smokeless tobacco. He reports that he drinks about 1.8 - 2.4 oz of alcohol per week . He reports that he does not use drugs.  Allergies:  Allergies  Allergen Reactions  . Acetaminophen Other (See Comments)    Liver issues  . Norvasc [Amlodipine] Other (See Comments)    "felt bad"    Medications Prior to Admission  Medication Sig Dispense Refill  . diphenhydrAMINE (BENADRYL) 25 mg capsule Take 50 mg by mouth at bedtime.    . gabapentin (NEURONTIN) 300 MG capsule Take 300-600 mg by mouth 4 (four) times daily. 1 capsule in the morning, noon and evening, and 2 capsules at bedtime.    Marland Kitchen ibuprofen (ADVIL,MOTRIN) 200 MG tablet Take 400 mg by mouth every 6 (six) hours as needed for moderate pain.    Marland Kitchen loperamide (IMODIUM) 2 MG capsule Take 2 mg by mouth as needed for diarrhea or loose stools.    Marland Kitchen LORazepam (ATIVAN) 1 MG tablet Take 1 tablet (1 mg total) by mouth 2 (two) times  daily. 60 tablet 2  . Multiple Vitamin (MULTIVITAMIN WITH MINERALS) TABS tablet Take 1 tablet by mouth daily.    . ranitidine (ZANTAC) 150 MG tablet Take 1 tablet (150 mg total) by mouth at bedtime. (Patient taking differently: Take 150 mg by mouth daily as needed for heartburn. ) 60 tablet 5  . sucralfate (CARAFATE) 1 GM/10ML suspension Take 1 g by mouth 4 (four) times daily.     . cyclobenzaprine (FLEXERIL) 10 MG tablet Take 1 tablet (10 mg total) by mouth 3 (three) times daily as needed for muscle spasms. 60 tablet 1  . loratadine (CLARITIN) 10 MG tablet Take 10 mg by mouth daily as needed for allergies.     . promethazine (PHENERGAN) 25 MG tablet Take 25  mg by mouth every 6 (six) hours as needed for nausea or vomiting.     . traMADol (ULTRAM) 50 MG tablet Take 1 tablet (50 mg total) by mouth 2 (two) times daily. Prn, patient is not to take no more than 2 per day and it must last 30 days. 60 tablet 0    No results found for this or any previous visit (from the past 48 hour(s)). No results found.  ROS  Blood pressure (!) 146/87, pulse 96, temperature 97.9 F (36.6 C), resp. rate 18, height 6' (1.829 m), weight 167 lb (75.8 kg), SpO2 99 %. Physical Exam  Constitutional: He appears well-developed and well-nourished.  HENT:  Mouth/Throat: Oropharynx is clear and moist.  Eyes: Conjunctivae are normal. No scleral icterus.  Stye at left lower eyelid  Neck: No thyromegaly present.  Cardiovascular: Normal rate, regular rhythm and normal heart sounds.   No murmur heard. Respiratory: Effort normal and breath sounds normal.  GI:  Abdomen is full but soft and nontender without organomegaly or masses.  Musculoskeletal: He exhibits no edema.  Lymphadenopathy:    He has no cervical adenopathy.  Neurological: He is alert.  Skin: Skin is warm and dry.     Assessment/Plan Solid food dysphagia. Known esophageal stricture. EGD with ED under monitored anesthesia care.  Hildred Laser,  MD 10/13/2016, 9:12 AM

## 2016-10-13 NOTE — Discharge Instructions (Signed)
Do not take ibuprofen or other NSAIDs. Resume other medications and diet as before. Remember you should be eating 6 small meals daily rather than 3 regular meals. Ondansetron 4 mg by mouth twice a day when necessary nausea or vomiting No driving for 24 hours. Physician will call with biopsy results.  Esophagogastroduodenoscopy, Care After Introduction Refer to this sheet in the next few weeks. These instructions provide you with information about caring for yourself after your procedure. Your health care provider may also give you more specific instructions. Your treatment has been planned according to current medical practices, but problems sometimes occur. Call your health care provider if you have any problems or questions after your procedure. What can I expect after the procedure? After the procedure, it is common to have:  A sore throat.  Nausea.  Bloating.  Dizziness.  Fatigue. Follow these instructions at home:  Do not eat or drink anything until the numbing medicine (local anesthetic) has worn off and your gag reflex has returned. You will know that the local anesthetic has worn off when you can swallow comfortably.  Do not drive for 24 hours if you received a medicine to help you relax (sedative).  If your health care provider took a tissue sample for testing during the procedure, make sure to get your test results. This is your responsibility. Ask your health care provider or the department performing the test when your results will be ready.  Keep all follow-up visits as told by your health care provider. This is important. Contact a health care provider if:  You cannot stop coughing.  You are not urinating.  You are urinating less than usual. Get help right away if:  You have trouble swallowing.  You cannot eat or drink.  You have throat or chest pain that gets worse.  You are dizzy or light-headed.  You faint.  You have nausea or vomiting.  You have  chills.  You have a fever.  You have severe abdominal pain.  You have black, tarry, or bloody stools. This information is not intended to replace advice given to you by your health care provider. Make sure you discuss any questions you have with your health care provider. Document Released: 07/31/2012 Document Revised: 01/20/2016 Document Reviewed: 07/08/2015  2017 Elsevier  PATIENT INSTRUCTIONS POST-ANESTHESIA  IMMEDIATELY FOLLOWING SURGERY:  Do not drive or operate machinery for the first twenty four hours after surgery.  Do not make any important decisions for twenty four hours after surgery or while taking narcotic pain medications or sedatives.  If you develop intractable nausea and vomiting or a severe headache please notify your doctor immediately.  FOLLOW-UP:  Please make an appointment with your surgeon as instructed. You do not need to follow up with anesthesia unless specifically instructed to do so.  WOUND CARE INSTRUCTIONS (if applicable):  Keep a dry clean dressing on the anesthesia/puncture wound site if there is drainage.  Once the wound has quit draining you may leave it open to air.  Generally you should leave the bandage intact for twenty four hours unless there is drainage.  If the epidural site drains for more than 36-48 hours please call the anesthesia department.  QUESTIONS?:  Please feel free to call your physician or the hospital operator if you have any questions, and they will be happy to assist you.

## 2016-10-16 ENCOUNTER — Encounter: Payer: Self-pay | Admitting: Family Medicine

## 2016-10-16 ENCOUNTER — Other Ambulatory Visit: Payer: Self-pay | Admitting: Family Medicine

## 2016-10-16 NOTE — Telephone Encounter (Signed)
I am going to deny his RF request for ativan (lorazepam). Per the EMR, his gastroenterologist (Dr. Laural Golden) rx'd this for him 10/03/16, #60, with 2 additional RF's. Therefore, he should not need a new rx for this medication at this time.--thx

## 2016-10-18 ENCOUNTER — Encounter (HOSPITAL_COMMUNITY): Payer: Self-pay | Admitting: Internal Medicine

## 2016-10-18 ENCOUNTER — Other Ambulatory Visit (INDEPENDENT_AMBULATORY_CARE_PROVIDER_SITE_OTHER): Payer: Self-pay | Admitting: Internal Medicine

## 2016-10-18 MED ORDER — OMEPRAZOLE 20 MG PO CPDR: 20.0000 mg | DELAYED_RELEASE_CAPSULE | Freq: Every day | ORAL | 5 refills | Status: DC

## 2016-10-20 NOTE — Telephone Encounter (Signed)
Patients wife notified and verbalized understanding.  She stated that they were not requesting refills at this time.

## 2016-10-21 DIAGNOSIS — R531 Weakness: Secondary | ICD-10-CM | POA: Diagnosis not present

## 2016-10-21 DIAGNOSIS — R188 Other ascites: Secondary | ICD-10-CM | POA: Diagnosis not present

## 2016-10-22 DIAGNOSIS — K6389 Other specified diseases of intestine: Secondary | ICD-10-CM | POA: Diagnosis not present

## 2016-10-22 DIAGNOSIS — R188 Other ascites: Secondary | ICD-10-CM | POA: Diagnosis not present

## 2016-10-23 DIAGNOSIS — R140 Abdominal distension (gaseous): Secondary | ICD-10-CM | POA: Diagnosis not present

## 2016-10-24 DIAGNOSIS — I083 Combined rheumatic disorders of mitral, aortic and tricuspid valves: Secondary | ICD-10-CM | POA: Diagnosis not present

## 2016-10-27 DIAGNOSIS — S0990XA Unspecified injury of head, initial encounter: Secondary | ICD-10-CM | POA: Diagnosis not present

## 2016-10-27 DIAGNOSIS — M542 Cervicalgia: Secondary | ICD-10-CM | POA: Diagnosis not present

## 2016-10-27 DIAGNOSIS — S59901A Unspecified injury of right elbow, initial encounter: Secondary | ICD-10-CM | POA: Diagnosis not present

## 2016-10-27 DIAGNOSIS — S199XXA Unspecified injury of neck, initial encounter: Secondary | ICD-10-CM | POA: Diagnosis not present

## 2016-11-06 LAB — CBC AND DIFFERENTIAL
HEMATOCRIT: 30 % — AB (ref 41–53)
HEMOGLOBIN: 9.4 g/dL — AB (ref 13.5–17.5)
Platelets: 152 10*3/uL (ref 150–399)
WBC: 6.2 10^3/mL

## 2016-11-07 LAB — HEPATIC FUNCTION PANEL
ALT: 22 U/L (ref 10–40)
AST: 35 U/L (ref 14–40)
Alkaline Phosphatase: 95 U/L (ref 25–125)

## 2016-11-07 LAB — BASIC METABOLIC PANEL
BUN: 16 mg/dL (ref 4–21)
CREATININE: 1 mg/dL (ref <=1.3)
Potassium: 3.9 mmol/L (ref 3.4–5.3)
Sodium: 137 mmol/L (ref 137–147)

## 2016-11-21 ENCOUNTER — Ambulatory Visit (INDEPENDENT_AMBULATORY_CARE_PROVIDER_SITE_OTHER): Payer: Self-pay | Admitting: Internal Medicine

## 2016-11-21 ENCOUNTER — Ambulatory Visit (INDEPENDENT_AMBULATORY_CARE_PROVIDER_SITE_OTHER): Payer: Medicare Other | Admitting: Internal Medicine

## 2016-11-23 ENCOUNTER — Telehealth: Payer: Self-pay | Admitting: Family Medicine

## 2016-11-23 NOTE — Telephone Encounter (Signed)
Patient is being discharged from [facility] on 04/01. He has been scheduled for a hospital follow up on 04/10 at 11:15.

## 2016-11-25 ENCOUNTER — Other Ambulatory Visit: Payer: Self-pay | Admitting: Family Medicine

## 2016-11-27 NOTE — Telephone Encounter (Signed)
Reviewed meds: Dr. Laural Golden rx'd ativan 1mg  with instructions to take 1 bid prn, #60, with specific instructions to pharmacy to "fill every 30 days".  Two RFs on this rx--means he should still have 1 RF left on this rx.-thx

## 2016-11-29 ENCOUNTER — Other Ambulatory Visit (INDEPENDENT_AMBULATORY_CARE_PROVIDER_SITE_OTHER): Payer: Self-pay | Admitting: *Deleted

## 2016-11-29 ENCOUNTER — Telehealth: Payer: Self-pay | Admitting: *Deleted

## 2016-11-29 MED ORDER — ONDANSETRON HCL 4 MG PO TABS: 4.0000 mg | ORAL_TABLET | Freq: Two times a day (BID) | ORAL | 1 refills | Status: DC | PRN

## 2016-11-29 MED ORDER — FLUOXETINE HCL 20 MG PO TABS
20.0000 mg | ORAL_TABLET | Freq: Every day | ORAL | 3 refills | Status: DC
Start: 2016-11-29 — End: 2016-12-05

## 2016-11-29 MED ORDER — GABAPENTIN 300 MG PO CAPS: ORAL_CAPSULE | ORAL | 3 refills | Status: DC

## 2016-11-29 MED ORDER — OMEPRAZOLE 20 MG PO CPDR: 20.0000 mg | DELAYED_RELEASE_CAPSULE | Freq: Every day | ORAL | 5 refills | Status: DC

## 2016-11-29 MED ORDER — SUCRALFATE 1 G PO TABS: 2.0000 g | ORAL_TABLET | Freq: Every day | ORAL | 3 refills | Status: DC

## 2016-11-29 MED ORDER — LORAZEPAM 1 MG PO TABS: 1.0000 mg | ORAL_TABLET | Freq: Two times a day (BID) | ORAL | 2 refills | Status: DC

## 2016-11-29 NOTE — Telephone Encounter (Signed)
Pts daughter advised and voiced understanding.  

## 2016-11-29 NOTE — Telephone Encounter (Signed)
Gary Vasquez with Dr. Olevia Perches office [facility] asking for a return call in regards to this pt. SW Gary Vasquez and she stated that pts daughter has now took over care for pt and is having him go to a rehab center x 1 month. Gary Vasquez stated that pts daughter requested refills, Dr. Laural Golden said he would fill all medications except for the gabapentin and Prozac (43m taking 1 daily in the morning). Gary Vasquez stated that the daughter asked if she would contact Dr. MIsla Penceoffice and request these refills. Please advise. Thanks.   Ph[facility]: [facility] Pts daugther: Gary [phone]

## 2016-11-29 NOTE — Telephone Encounter (Signed)
OK.  I sent in eRx's for gabapentin and prozac as per daughters request.

## 2016-12-05 ENCOUNTER — Ambulatory Visit (INDEPENDENT_AMBULATORY_CARE_PROVIDER_SITE_OTHER): Payer: Medicare Other | Admitting: Family Medicine

## 2016-12-05 ENCOUNTER — Encounter: Payer: Self-pay | Admitting: Family Medicine

## 2016-12-05 ENCOUNTER — Encounter: Payer: Self-pay | Admitting: *Deleted

## 2016-12-05 VITALS — BP 109/66 | HR 95 | Temp 97.9°F | Resp 16 | Ht 72.0 in | Wt 164.5 lb

## 2016-12-05 DIAGNOSIS — Z8719 Personal history of other diseases of the digestive system: Secondary | ICD-10-CM | POA: Diagnosis not present

## 2016-12-05 DIAGNOSIS — K2100 Gastro-esophageal reflux disease with esophagitis, without bleeding: Secondary | ICD-10-CM

## 2016-12-05 DIAGNOSIS — R011 Cardiac murmur, unspecified: Secondary | ICD-10-CM | POA: Diagnosis not present

## 2016-12-05 DIAGNOSIS — R159 Full incontinence of feces: Secondary | ICD-10-CM

## 2016-12-05 DIAGNOSIS — F331 Major depressive disorder, recurrent, moderate: Secondary | ICD-10-CM

## 2016-12-05 DIAGNOSIS — D649 Anemia, unspecified: Secondary | ICD-10-CM | POA: Diagnosis not present

## 2016-12-05 DIAGNOSIS — K529 Noninfective gastroenteritis and colitis, unspecified: Secondary | ICD-10-CM | POA: Diagnosis not present

## 2016-12-05 DIAGNOSIS — R1310 Dysphagia, unspecified: Secondary | ICD-10-CM

## 2016-12-05 DIAGNOSIS — K909 Intestinal malabsorption, unspecified: Secondary | ICD-10-CM

## 2016-12-05 DIAGNOSIS — K760 Fatty (change of) liver, not elsewhere classified: Secondary | ICD-10-CM

## 2016-12-05 DIAGNOSIS — R1319 Other dysphagia: Secondary | ICD-10-CM

## 2016-12-05 DIAGNOSIS — F1020 Alcohol dependence, uncomplicated: Secondary | ICD-10-CM | POA: Diagnosis not present

## 2016-12-05 DIAGNOSIS — K766 Portal hypertension: Secondary | ICD-10-CM | POA: Diagnosis not present

## 2016-12-05 MED ORDER — DIPHENOXYLATE-ATROPINE 2.5-0.025 MG PO TABS: 1.0000 | ORAL_TABLET | Freq: Four times a day (QID) | ORAL | 2 refills | Status: DC | PRN

## 2016-12-05 MED ORDER — FLUOXETINE HCL 40 MG PO CAPS: 40.0000 mg | ORAL_CAPSULE | Freq: Every day | ORAL | 1 refills | Status: DC

## 2016-12-05 NOTE — Progress Notes (Signed)
OFFICE VISIT  12/06/2016   CC:  Chief Complaint  Patient presents with  . Hospitalization Follow-up   HPI:    Patient is a 66 y.o. Caucasian male who presents for f/u several issues. Pt is alone today and is a poor historian. I last saw him 03/27/16. Since that time he has had another EGD with esoph stricture dilation 10/21/16 (Dr. Laural Golden). Admitted to [facility] 2/24-3/13, 2018 for alcohol withdrawal, ascites w/out infection--he has a marked fatty liver from chronic long term alcohol abuse. He had anemia, which was felt to be anemia of chronic dz + chronic gastritis.  Iron studies ordered in hospital per records but no results in records.  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  After d/c from [place], he went into alcohol detox/rehab for 1 month: started prozac 20mg  upon d/c about 1 mo ago. He quit drinking upon admission to [facility] 09/2016.  Says no drink since then. He also recalls overdosing on ativan and pain meds and then drinking a lot, which is what led to his 2/24 [facility] hospital admission.  He cannot tell that he has had any improvement in anxiety and depression on prozac 20mg  qd.  Pt feels difficulty swallowing solids>liquids.  He feels like he needs esoph dilation again and is trying to get appt with Dr. Laural Golden.    At the end of his visit he wanted to talk about recent diarrhea.  Says he has been having about 8 BMs per day, excessive bloating. Some after meals and some random. No n/v.  No fevers.  Some vague generalized abd pain.  No blood in stool. Unclear how long this has been going on.  Past Medical History:  Diagnosis Date  . Alcoholic hepatitis   . Alcoholic liver disease ([place])   . Alcoholic pancreatitis   . Alcoholism ([place])    Still drinking as of 09/2016  . Allergy   . Anxiety   . Anxiety and depression   . Arthritis   . Aspiration pneumonitis (H[facility])    recurrent  . Bile reflux esophagitis    Chronic; due to gastrojejunostomy.  He was evaluated  by Dr. Demetrio Lapping of [facility] last year for Roux-en-Y procedure. He decided not to proceed with surgery because of high risk. Metoclopramide resulted in diarrhea and dominant and domperodone was ineffective.   . Chronic back pain   . Chronic diarrhea   . Crohn disease ([place])    in remission since approx 1995  . DDD (degenerative disc disease), cervical 02/2013   MRI by Dr. Joneen Caraway  . DDD (degenerative disc disease), lumbar    L > R lumbar radiculopathy  . Depression   . Dumping syndrome   . Encephalomalacia on imaging study 02/2013   MRI brain w/o contrast: Mild progression of left occipital lobe encephalomalacia and  . Essential hypertension, benign   . Fatty liver 02/2013; 03/2016   Abd u/s.  Also noted on CT's of abdomen.  . Gastroparesis   . GERD (gastroesophageal reflux disease)   . History of esophageal stricture    Recurrent--at GE junction.  Has required multiple dilation procedures (most recent 10/13/16).  Esophagrams have shown normal swallowing function.  Marland Kitchen History of kidney stones   . History of liver failure    (alchoholic) Recovered in hosp  . Hypogonadism male   . Migraine headache   . Paraesophageal hernia   . Sleep apnea    cannot tolerate CPAP  . Stroke [facility])    short term memory loss.  Marland Kitchen  Subdural hematoma ([place])   . TIA (transient ischemic attack)     Past Surgical History:  Procedure Laterality Date  . BILROTH II PROCEDURE     with gastrojejunostomy  . CARDIOVASCULAR STRESS TEST  01/30/14   Myocardial perfusion imaging: NORMAL ([name]).  . CHOLECYSTECTOMY  05/2016   Open.    . COLON RESECTION Right    right colon resection and ileocolonic anastamosis  . COLONOSCOPY N/A 07/02/2013   Procedure: COLONOSCOPY;  Surgeon: Rogene Houston, MD;  [facility];  Service: Endoscopy;  Laterality: N/A;  200  . ESOPHAGEAL DILATION N/A 10/30/2014   Procedure: ESOPHAGEAL DILATION;  Surgeon: Rogene Houston, MD;  [facility];  Service: Endoscopy;   Laterality: N/A;  . ESOPHAGEAL DILATION N/A 02/12/2015   Procedure: ESOPHAGEAL DILATION;  Surgeon: Rogene Houston, MD;  [facility];  Service: Endoscopy;  Laterality: N/A;  . ESOPHAGEAL DILATION N/A 01/21/2016   Procedure: ESOPHAGEAL DILATION;  Surgeon: Rogene Houston, MD;  [facility];  Service: Endoscopy;  Laterality: N/A;  . ESOPHAGEAL DILATION N/A 10/13/2016   Esophagitis, esoph stricture, and portal hypertensive gastropathy noted.  Procedure: ESOPHAGEAL DILATION;  Surgeon: Rogene Houston, MD;  [facility];  Service: Endoscopy;  Laterality: N/A;  . ESOPHAGOGASTRODUODENOSCOPY N/A 08/14/2013   Procedure: ESOPHAGOGASTRODUODENOSCOPY (EGD);  Surgeon: Rogene Houston, MD;  [facility];  Service: Endoscopy;  Laterality: N/A;  300  . ESOPHAGOGASTRODUODENOSCOPY N/A 10/30/2014   Procedure: ESOPHAGOGASTRODUODENOSCOPY (EGD);  Surgeon: Rogene Houston, MD;  [facility];  Service: Endoscopy;  Laterality: N/A;  940  . ESOPHAGOGASTRODUODENOSCOPY N/A 02/12/2015   Procedure: ESOPHAGOGASTRODUODENOSCOPY (EGD);  Surgeon: Rogene Houston, MD;  [facility];  Service: Endoscopy;  Laterality: N/A;  105  . ESOPHAGOGASTRODUODENOSCOPY  01/21/16   gastritis/reactive gastropathy.  H pylori neg.  Bx benign.  . ESOPHAGOGASTRODUODENOSCOPY N/A 01/21/2016   Procedure: ESOPHAGOGASTRODUODENOSCOPY (EGD);  Surgeon: Rogene Houston, MD;  [facility];  Service: Endoscopy;  Laterality: N/A;  1:10 - moved to 5/26 @ 8:25  . ESOPHAGOGASTRODUODENOSCOPY  10/13/2016   Benign gastric mucosa.  Chronic inactive gastritis.  H pylori NEG (Dr. Laural Golden)  . ESOPHAGOGASTRODUODENOSCOPY (EGD) WITH PROPOFOL N/A 10/13/2016   Procedure: ESOPHAGOGASTRODUODENOSCOPY (EGD) WITH PROPOFOL;  Surgeon: Rogene Houston, MD;  [facility];  Service: Endoscopy;  Laterality: N/A;  9:15  . HIATAL HERNIA REPAIR  09/2007   He underwent repair of large hiatal hernia and pyloroplasty  which was complicated by pyloroplasty leak and subsequent gastrojejunostomy  . NISSEN FUNDOPLICATION     x2  . NOCTURNAL POLYSOMNOGRAM  05/2013   Severe periodic limb movement d/o: trial of dopamine agonist such as requip was recommended.  Marland Kitchen PYLOROPLASTY  2009  . TRANSTHORACIC ECHOCARDIOGRAM  12/2013   EF 65-70%, mild LVH, grade I DD, no valvular problems.    Outpatient Medications Prior to Visit  Medication Sig Dispense Refill  . diphenhydrAMINE (BENADRYL) 25 mg capsule Take 50 mg by mouth at bedtime.    . gabapentin (NEURONTIN) 300 MG capsule 1 capsule in the morning, noon and evening, and 2 capsules at bedtime. 120 capsule 3  . loratadine (CLARITIN) 10 MG tablet Take 10 mg by mouth daily as needed for allergies.     . Multiple Vitamin (MULTIVITAMIN WITH MINERALS) TABS tablet Take 1 tablet by mouth daily.    Marland Kitchen omeprazole (PRILOSEC) 20 MG capsule Take 1 capsule (20 mg total) by mouth daily.  30 capsule 5  . loperamide (IMODIUM) 2 MG capsule Take 2 mg by mouth as needed for diarrhea or loose stools.    . ondansetron (ZOFRAN) 4 MG tablet Take 1 tablet (4 mg total) by mouth 2 (two) times daily as needed for nausea or vomiting. 30 tablet 1  . sucralfate (CARAFATE) 1 g tablet Take 2 tablets (2 g total) by mouth at bedtime. (Patient not taking: Reported on 12/05/2016) 60 tablet 3  . cyclobenzaprine (FLEXERIL) 10 MG tablet Take 1 tablet (10 mg total) by mouth 3 (three) times daily as needed for muscle spasms. (Patient not taking: Reported on 12/05/2016) 60 tablet 1  . FLUoxetine (PROZAC) 20 MG tablet Take 1 tablet (20 mg total) by mouth daily. 30 tablet 3  . LORazepam (ATIVAN) 1 MG tablet Take 1 tablet (1 mg total) by mouth 2 (two) times daily. The prescription must last 30 days . (Patient not taking: Reported on 12/05/2016) 60 tablet 2  . ranitidine (ZANTAC) 150 MG tablet Take 1 tablet (150 mg total) by mouth at bedtime. (Patient not taking: Reported on 12/05/2016) 60 tablet 5  . traMADol (ULTRAM) 50 MG  tablet Take 1 tablet (50 mg total) by mouth 2 (two) times daily. Prn, patient is not to take no more than 2 per day and it must last 30 days. (Patient not taking: Reported on 12/05/2016) 60 tablet 0   No facility-administered medications prior to visit.     Allergies  Allergen Reactions  . Meloxicam     Other reaction(s): Unknown Per daughter   . Tramadol     Other reaction(s): Unknown  . Acetaminophen Other (See Comments)    Liver issues  . Norvasc [Amlodipine] Other (See Comments)    "felt bad"    ROS As per HPI  PE: Blood pressure 109/66, pulse 95, temperature 97.9 F (36.6 C), temperature source Oral, resp. rate 16, height 6' (1.829 m), weight 164 lb 8 oz (74.6 kg), SpO2 98 %. Gen: Alert, tired-appearing.  Patient is oriented to person, place, time, and situation. AFFECT: pleasant, lucid thought and speech. XBL:TJQZ: no injection, icteris, swelling, or exudate.  EOMI, PERRLA. Mouth: lips without lesion/swelling.  Oral mucosa pink and moist. Oropharynx without erythema, exudate, or swelling.  CV: RRR, 2/6 systolic murmur that radiates under both clavicles, without diastolic murmur.  NO r/g.   LUNGS: CTA bilat, nonlabored resps, good aeration in all lung fields. ABD: soft, rotund but no signif distention.  NO HSM or mass or bruit.  BS normal.  No tenderness. EXT: no clubbing, cyanosis, or edema.    LABS:  Lab Results  Component Value Date   TSH 2.56 03/20/2016   Lab Results  Component Value Date   WBC 6.2 11/06/2016   HGB 9.4 (A) 11/06/2016   HCT 30 (A) 11/06/2016   MCV 85.5 10/10/2016   PLT 152 11/06/2016   Lab Results  Component Value Date   CREATININE 1.0 11/07/2016   BUN 16 11/07/2016   NA 137 11/07/2016   K 3.9 11/07/2016   CL 106 10/10/2016   CO2 21 (L) 10/10/2016   Lab Results  Component Value Date   ALT 22 11/07/2016   AST 35 11/07/2016   ALKPHOS 95 11/07/2016   BILITOT 2.0 (H) 03/27/2016   Lab Results  Component Value Date   CHOL 165  01/24/2014   Lab Results  Component Value Date   HDL 48 01/24/2014   Lab Results  Component Value Date   LDLCALC 90 01/24/2014  Lab Results  Component Value Date   TRIG 135 01/24/2014   Lab Results  Component Value Date   CHOLHDL 3.4 01/24/2014   Lab Results  Component Value Date   PSA 1.43 03/20/2016   Lab Results  Component Value Date   HGBA1C 5.5 03/20/2013    IMPRESSION AND PLAN:  1) Alcoholism: with hx of addictive tendencies to pain meds as well. Daughter Meridith, [place] manages his meds and at this time wants to go ahead and continue use of ativan bid to help control his anger and make him easier to deal with.  Most recently rx'd by Dr. Laural Golden, filled #[phone] on 11/30/16.  2) Depression: increase prozac to 40mg  qd.  3) Chronic diarrhea: possibly a combo of short colon, bile salts, bowel wall edema, and possibly pancreatic exocrine insufficiency.  Will check vitamins A, D, E, and K today.  If these are all significantly low then will either start empiric pancreatic enzymes OR check fecal fat first.  Also, will consider trial of cholestyramine for bile acid binding.  For now, stop imodium, start lomotil 1 tab qid prn. Also need to consider lactose intolerance but pt seems to report that his sx's are not food-specific.  4) Normocytic anemia: suspect bone marrow suppression by chronic alcohol abuse.  Per recent hospital notes, iron studies and B12 testing were ordered/planned but these labs were not included in d/c paperwork with his other lab results. Will check ferritin, iron panel, vit B12 today.  5) Severe fatty liver with hx of alcoholic hepatitis and episode of liver failure, recently with signs of portal HTN (portal hypertensive gastropathy on EGD 10/13/16). Has had 2 episodes of ascites that have required diagnostic/therapeutic paracentesis.  Most recent one showed no sign of SBP. Has remained off alcohol for approx 6 weeks per his report.  No sign of ascites or LE  edema today.  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  No plans for this problem made today. I think we'll see how long he can stay sober before considering neurologist evaluation.  7) Heart murmur: echo ordered to further evaluate.  8) Dysphagia; hx of refractory GERD + recurrent esoph stricture and repeated esoph dilation.  He is trying to arrange f/u with GI MD, Dr. Laural Golden to get evaluated for possible repeat EGD with dilation. He'll continue omeprazole 20mg  qd at this time.  Spent 45 min with pt today, with >50% of this time spent in counseling and care coordination regarding the above problems.  FOLLOW UP: Return in about 4 weeks (around 01/02/2017) for routine chronic illness f/u (30 min appt).  ADDENDUM: 12/06/16: I spoke with Daughter Vira Blanco last night, who is [facility] POA: [phone]. She straightened out a few facts that Mr. Torok did not have straight: 1) He has NOT gone through a 30 day rehab program, but they are trying to talk him into going into one. 2) She manages his meds and at this time wants to go ahead and continue use of ativan bid to help control his anger and make him easier to deal with. 3) Pt has only been on prozac 20mg  qd for about 2 weeks, not a month.  I have still left the order in place for him to increase this to 40mg  qd.

## 2016-12-05 NOTE — Progress Notes (Signed)
Pre visit review using our clinic review tool, if applicable. No additional management support is needed unless otherwise documented below in the visit note. 

## 2016-12-06 ENCOUNTER — Encounter: Payer: Self-pay | Admitting: Family Medicine

## 2016-12-11 ENCOUNTER — Telehealth: Payer: Self-pay | Admitting: Family Medicine

## 2016-12-11 NOTE — Telephone Encounter (Signed)
Please advise. Thanks.  

## 2016-12-11 NOTE — Telephone Encounter (Signed)
Patient's daughter/poa, Ailene Ravel calling to inquire if patient would be ok to reschedule echocardiogram and push it out for a couple of weeks.  She has 2 weeks of class remaining and wants to push it out until then.  However, if Dr. Idelle Leech feels it is better to have the procedure done sooner rather than later, they will keep the appt patient has on 12/14/16.  Please return call to [facility], if no answer ok to leave detailed message

## 2016-12-11 NOTE — Telephone Encounter (Signed)
Reassure her that it is fine to push the echocardiogram out a few weeks.-thx

## 2016-12-12 NOTE — Telephone Encounter (Signed)
Pts daughter advised and voiced understanding. She also wanted to know if the labs could wait til then too. Per Dr. Anitra Lauth okay to have labs done when echocardiogram is done.

## 2016-12-14 ENCOUNTER — Ambulatory Visit (HOSPITAL_COMMUNITY): Payer: Medicare Other

## 2016-12-19 ENCOUNTER — Ambulatory Visit (INDEPENDENT_AMBULATORY_CARE_PROVIDER_SITE_OTHER): Payer: Medicare Other | Admitting: Internal Medicine

## 2016-12-19 ENCOUNTER — Encounter (INDEPENDENT_AMBULATORY_CARE_PROVIDER_SITE_OTHER): Payer: Self-pay | Admitting: Internal Medicine

## 2016-12-19 VITALS — BP 122/70 | HR 74 | Temp 97.9°F | Resp 18 | Ht 72.0 in | Wt 156.6 lb

## 2016-12-19 DIAGNOSIS — R1114 Bilious vomiting: Secondary | ICD-10-CM | POA: Diagnosis not present

## 2016-12-19 DIAGNOSIS — F411 Generalized anxiety disorder: Secondary | ICD-10-CM

## 2016-12-19 DIAGNOSIS — K219 Gastro-esophageal reflux disease without esophagitis: Secondary | ICD-10-CM

## 2016-12-19 MED ORDER — LOPERAMIDE HCL 2 MG PO TABS: 2.0000 mg | ORAL_TABLET | Freq: Three times a day (TID) | ORAL | 0 refills | Status: AC

## 2016-12-19 NOTE — Progress Notes (Signed)
Presenting complaint;  Follow-up for nausea vomiting GERD and diarrhea.  Subjective:  Patient is 66 year old Caucasian male who is in for scheduled visit. Her daughter Vira Blanco could not accompany him today. He was in a nursing home for a few weeks but he's been back home for 2 months. He has not had any alcohol in over 2 months. Overall he feels better but he still has nausea and vomiting once or twice a day. He generally vomits small amount of bile and rarely he may see few pieces of food particles. Vomiting usually occurs when he wakes up or late in the evening. He states he is able to swallow better but has difficulty with bread and chicken. He is eating 6 small meals daily. He feels his intake is adequate but he is continuing to lose weight. He has lost 5 pounds since his last visit of 10/03/2016. He also complains of diarrhea. He has 7-8 stools per day. Most of his stools are small in volume. Consistency varies between normal to lose. He has 4-5 accidents every week. He was given Lomotil but he says it is not helping. [name] ordered multiple tests to make sure he does not have malabsorption. He has not had these tests yet.  Current Medications: Outpatient Encounter Prescriptions as of 12/19/2016  Medication Sig  . diphenoxylate-atropine (LOMOTIL) 2.5-0.025 MG tablet Take 1 tablet by mouth 4 (four) times daily as needed for diarrhea or loose stools.  Marland Kitchen FLUoxetine (PROZAC) 40 MG capsule Take 1 capsule (40 mg total) by mouth daily.  . folic acid (FOLVITE) 1 MG tablet Take 1 tablet by mouth daily.  Marland Kitchen gabapentin (NEURONTIN) 300 MG capsule 1 capsule in the morning, noon and evening, and 2 capsules at bedtime.  Marland Kitchen loratadine (CLARITIN) 10 MG tablet Take 10 mg by mouth daily as needed for allergies.   Marland Kitchen LORazepam (ATIVAN) 1 MG tablet Take 1 mg by mouth 2 (two) times daily.  . Multiple Vitamin (MULTIVITAMIN WITH MINERALS) TABS tablet Take 1 tablet by mouth daily.  Marland Kitchen omeprazole (PRILOSEC) 20 MG  capsule Take 1 capsule (20 mg total) by mouth daily.  . sucralfate (CARAFATE) 1 g tablet Take 2 tablets (2 g total) by mouth at bedtime.  . diphenhydrAMINE (BENADRYL) 25 mg capsule Take 50 mg by mouth at bedtime.  . Thiamine Mononitrate (VITAMIN B1) 100 MG TABS Take 1 tablet by mouth daily.  . [DISCONTINUED] Melatonin 1 MG TABS Take 1 tablet by mouth at bedtime.  . [DISCONTINUED] Multiple Vitamin (THERA) TABS Take 1 tablet by mouth daily.   No facility-administered encounter medications on file as of 12/19/2016.      Objective: Blood pressure 122/70, pulse 74, temperature 97.9 F (36.6 C), temperature source Oral, resp. rate 18, height 6' (1.829 m), weight 156 lb 9.6 oz (71 kg). Patient is alert and in no acute distress. Conjunctiva is pink. Sclera is nonicteric Oropharyngeal mucosa is normal. No neck masses or thyromegaly noted. Cardiac exam with regular rhythm normal S1 and S2. No murmur or gallop noted. Lungs are clear to auscultation. Abdomen is symmetrical. He has upper midline scar. Bowel sounds normal. On palpation abdomen is soft with mild midepigastric tenderness. No organomegaly or masses.  No LE edema or clubbing noted.   Assessment:  #1. Chronic nausea and vomiting secondary to GERD and bile reflux resulting from B2 anastomosis. Patient has been evaluated for Roux-en-Y but considered to be high risk. Will continue symptomatic therapy.  #2. GERD. He is on PPI with control of  heartburn but not nausea and vomiting. GERD has been complicated by esophageal stricture which was dilated about 9 weeks ago.  #3. Anxiety state. Patient is on lorazepam.  #4. Diarrhea. He has history of Crohn's disease but he has been in remission for 7 years. He has had right hemicolectomy. Recent cholecystectomy may also be contacted regarding to his diarrhea. [name] is screening him for malabsorptive syndrome. When history of pancreatitis also have to consider pancreatic insufficiency. Will  pursue with further workup if he does not respond to therapy and depending on results of pending studies. #5. History of alcohol abuse. He has not had any alcohol in over 2 months.   Plan:  Discontinue diphenoxylate as it is not working. Loperamide OTC 2 mg before each meal. Call with progress report in one week. Office visit in 4 months.

## 2016-12-19 NOTE — Patient Instructions (Signed)
Can take Imodium 2 mg before each meal. If no better in one week please call office.

## 2016-12-26 ENCOUNTER — Telehealth: Payer: Self-pay | Admitting: Family Medicine

## 2016-12-26 DIAGNOSIS — R011 Cardiac murmur, unspecified: Secondary | ICD-10-CM

## 2016-12-26 NOTE — Telephone Encounter (Signed)
Orders for a heart ultrasound and blood work need to be reentered for [facility] please. Gary Vasquez would like to have the labs done at the hospital while he is there for his U/S please. If possible they would like 5/10 at 9:15am. [name],Please call Gary Vasquez to confirm.

## 2016-12-27 NOTE — Telephone Encounter (Signed)
Left message for pts daughter to call back.

## 2016-12-27 NOTE — Telephone Encounter (Signed)
Echocardiogram order re-entered. Pls re-fax lab request. --thx

## 2016-12-27 NOTE — Telephone Encounter (Signed)
I can re-fax lab req from last visit.   SW [name] and she said that the old order was cancelled. Please re-order u/s for [facility]. Thanks.

## 2016-12-28 NOTE — Telephone Encounter (Signed)
SW Gary Vasquez & gave her [place] appt info. She is going to call to confirm & possibly move the appt time. She is going to have the person transfer her to the lab to have the technician look at the order to see if they can draw his labs there.

## 2017-01-02 ENCOUNTER — Ambulatory Visit (INDEPENDENT_AMBULATORY_CARE_PROVIDER_SITE_OTHER): Payer: Self-pay | Admitting: Internal Medicine

## 2017-01-03 ENCOUNTER — Encounter (INDEPENDENT_AMBULATORY_CARE_PROVIDER_SITE_OTHER): Payer: Self-pay | Admitting: Internal Medicine

## 2017-01-04 ENCOUNTER — Other Ambulatory Visit (HOSPITAL_COMMUNITY)
Admission: RE | Admit: 2017-01-04 | Discharge: 2017-01-04 | Disposition: A | Discharge status: Home / Self Care | Payer: Medicare Other | Source: Ambulatory Visit | Attending: Family Medicine | Admitting: Family Medicine

## 2017-01-04 ENCOUNTER — Ambulatory Visit (HOSPITAL_COMMUNITY)
Admission: RE | Admit: 2017-01-04 | Discharge: 2017-01-04 | Disposition: A | Discharge status: Home / Self Care | Payer: Medicare Other | Source: Ambulatory Visit | Attending: Family Medicine | Admitting: Family Medicine

## 2017-01-04 ENCOUNTER — Encounter: Payer: Self-pay | Admitting: Family Medicine

## 2017-01-04 DIAGNOSIS — I361 Nonrheumatic tricuspid (valve) insufficiency: Secondary | ICD-10-CM | POA: Diagnosis not present

## 2017-01-04 DIAGNOSIS — D649 Anemia, unspecified: Secondary | ICD-10-CM | POA: Insufficient documentation

## 2017-01-04 DIAGNOSIS — R011 Cardiac murmur, unspecified: Secondary | ICD-10-CM | POA: Diagnosis not present

## 2017-01-04 DIAGNOSIS — K909 Intestinal malabsorption, unspecified: Secondary | ICD-10-CM | POA: Diagnosis not present

## 2017-01-04 LAB — CBC WITH DIFFERENTIAL/PLATELET
BASOS ABS: 0.1 10*3/uL (ref 0.0–0.1)
Basophils Relative: 1 %
EOS PCT: 0 %
Eosinophils Absolute: 0 10*3/uL (ref 0.0–0.7)
HEMATOCRIT: 32.2 % — AB (ref 39.0–52.0)
Hemoglobin: 10.7 g/dL — ABNORMAL LOW (ref 13.0–17.0)
LYMPHS ABS: 2 10*3/uL (ref 0.7–4.0)
Lymphocytes Relative: 35 %
MCH: 28.5 pg (ref 26.0–34.0)
MCHC: 33.2 g/dL (ref 30.0–36.0)
MCV: 85.9 fL (ref 78.0–100.0)
MONO ABS: 0.6 10*3/uL (ref 0.1–1.0)
Monocytes Relative: 11 %
NEUTROS ABS: 2.9 10*3/uL (ref 1.7–7.7)
Neutrophils Relative %: 53 %
PLATELETS: 146 10*3/uL — AB (ref 150–400)
RBC: 3.75 MIL/uL — ABNORMAL LOW (ref 4.22–5.81)
RDW: 18.4 % — AB (ref 11.5–15.5)
WBC: 5.5 10*3/uL (ref 4.0–10.5)

## 2017-01-04 LAB — COMPREHENSIVE METABOLIC PANEL
ALT: 24 U/L (ref 17–63)
AST: 35 U/L (ref 15–41)
Albumin: 2.3 g/dL — ABNORMAL LOW (ref 3.5–5.0)
Alkaline Phosphatase: 118 U/L (ref 38–126)
Anion gap: 6 (ref 5–15)
BILIRUBIN TOTAL: 1.7 mg/dL — AB (ref 0.3–1.2)
BUN: 12 mg/dL (ref 6–20)
CO2: 24 mmol/L (ref 22–32)
CREATININE: 1.33 mg/dL — AB (ref 0.61–1.24)
Calcium: 8.4 mg/dL — ABNORMAL LOW (ref 8.9–10.3)
Chloride: 105 mmol/L (ref 101–111)
GFR calc non Af Amer: 55 mL/min — ABNORMAL LOW (ref >60)
Glucose, Bld: 93 mg/dL (ref 65–99)
POTASSIUM: 3.6 mmol/L (ref 3.5–5.1)
Sodium: 135 mmol/L (ref 135–145)
TOTAL PROTEIN: 7.3 g/dL (ref 6.5–8.1)

## 2017-01-04 LAB — IRON AND TIBC
IRON: 108 ug/dL (ref 45–182)
Saturation Ratios: 41 % — ABNORMAL HIGH (ref 17.9–39.5)
TIBC: 260 ug/dL (ref 250–450)
UIBC: 152 ug/dL

## 2017-01-04 LAB — VITAMIN B12: Vitamin B-12: 1426 pg/mL — ABNORMAL HIGH (ref 180–914)

## 2017-01-04 LAB — FERRITIN: FERRITIN: 18 ng/mL — AB (ref 24–336)

## 2017-01-04 NOTE — Progress Notes (Signed)
*  PRELIMINARY RESULTS* Echocardiogram 2D Echocardiogram has been performed.  Gary Vasquez 01/04/2017, 11:23 AM

## 2017-01-05 LAB — VITAMIN D 25 HYDROXY (VIT D DEFICIENCY, FRACTURES): Vit D, 25-Hydroxy: 38.4 ng/mL (ref 30.0–100.0)

## 2017-01-05 NOTE — Telephone Encounter (Signed)
SW lab tech from [facility], lab req faxed to them.

## 2017-01-08 LAB — MISC LABCORP TEST (SEND OUT): Labcorp test code: 121200

## 2017-01-11 ENCOUNTER — Ambulatory Visit (INDEPENDENT_AMBULATORY_CARE_PROVIDER_SITE_OTHER): Payer: Medicare Other | Admitting: Family Medicine

## 2017-01-11 ENCOUNTER — Other Ambulatory Visit: Payer: Medicare Other

## 2017-01-11 ENCOUNTER — Encounter: Payer: Self-pay | Admitting: Family Medicine

## 2017-01-11 VITALS — BP 101/61 | HR 77 | Temp 97.9°F | Resp 16 | Ht 72.0 in | Wt 156.0 lb

## 2017-01-11 DIAGNOSIS — D649 Anemia, unspecified: Secondary | ICD-10-CM

## 2017-01-11 DIAGNOSIS — F419 Anxiety disorder, unspecified: Secondary | ICD-10-CM

## 2017-01-11 DIAGNOSIS — R197 Diarrhea, unspecified: Secondary | ICD-10-CM

## 2017-01-11 DIAGNOSIS — E291 Testicular hypofunction: Secondary | ICD-10-CM

## 2017-01-11 DIAGNOSIS — F32A Depression, unspecified: Secondary | ICD-10-CM

## 2017-01-11 DIAGNOSIS — R5382 Chronic fatigue, unspecified: Secondary | ICD-10-CM

## 2017-01-11 DIAGNOSIS — K7210 Chronic hepatic failure without coma: Secondary | ICD-10-CM

## 2017-01-11 DIAGNOSIS — K909 Intestinal malabsorption, unspecified: Secondary | ICD-10-CM

## 2017-01-11 DIAGNOSIS — K709 Alcoholic liver disease, unspecified: Secondary | ICD-10-CM

## 2017-01-11 DIAGNOSIS — F1020 Alcohol dependence, uncomplicated: Secondary | ICD-10-CM

## 2017-01-11 DIAGNOSIS — F329 Major depressive disorder, single episode, unspecified: Secondary | ICD-10-CM

## 2017-01-11 NOTE — Addendum Note (Signed)
Addended by: Leota Jacobsen on: 01/11/2017 02:00 PM   Modules accepted: Orders

## 2017-01-11 NOTE — Progress Notes (Signed)
OFFICE VISIT  01/11/2017   CC:  Chief Complaint  Patient presents with  . Follow-up    RCI,    HPI:    Patient is a 66 y.o. Caucasian male who presents accompanied by his daughter Meridith for 5 week f/u depression--we increased his fluoxetine to 47m qd last visit.  We also checked an echocardiogram due to a systolic murmur and this showed only some calcification of aortic and mitral valves, o/w normal.  We also checked some labs, which showed low Vit E and Vit A, borderline low Vit K.  Vit D normal.  Iron was also low, with mild anemia that is chronic.  He feels like his anger and anxiety is better since increase in fluox but has less motivation, feels a bit flattened.  Denies any worsening of mood.  He says he is still abstaining from alcohol (since Feb 2018).  Eats 3-4 small meals per day, not gaining wt but not losing either.  Taking imodium tid regularly; this has helped decrease his BM frequency to 3-4 per day and it is not completely liquid at this time.     Past Medical History:  Diagnosis Date  . Alcoholic hepatitis   . Alcoholic liver disease ([facility]   . Alcoholic pancreatitis   . Alcoholism ([facility]    Still drinking as of 09/2016  . Allergy   . Anxiety   . Anxiety and depression   . Arthritis   . Aspiration pneumonitis (HCC)    recurrent  . Bile reflux esophagitis    Chronic; due to gastrojejunostomy.  [facility]. He decided not to proceed with surgery because of high risk. Metoclopramide resulted in diarrhea and dominant and domperodone was ineffective.   . Chronic back pain   . Chronic diarrhea   . Crohn disease ([facility]    in remission since approx 1995  . DDD (degenerative disc disease), cervical 02/2013   MRI by Dr. DJoneen Caraway . DDD (degenerative disc disease), lumbar    L > R lumbar radiculopathy  . Depression   . Dumping syndrome   . Encephalomalacia on imaging study 02/2013   MRI brain w/o  contrast: Mild progression of left occipital lobe encephalomalacia and  . Essential hypertension, benign   . Fatty liver 02/2013; 03/2016   Abd u/s.  Also noted on CT's of abdomen.  . Gastroparesis   . GERD (gastroesophageal reflux disease)   . Heart murmur 2018   ECHO: normal except calcified aortic annulus and leaflets, w/out any stenosis or compromise of valve function.  .Marland KitchenHistory of esophageal stricture    Recurrent--at GE junction.  Has required multiple dilation procedures (most recent 10/13/16).  Esophagrams have shown normal swallowing function.  .Marland KitchenHistory of kidney stones   . History of liver failure    (alchoholic) Recovered in hosp  . Hypogonadism male   . Migraine headache   . Paraesophageal hernia   . Sleep apnea    cannot tolerate CPAP  . Stroke ([facility]    short term memory loss.  . Subdural hematoma ([facility]   . TIA (transient ischemic attack)     Past Surgical History:  Procedure Laterality Date  . BILROTH II PROCEDURE     with gastrojejunostomy.  *Loss of duodenal continuity--malabsorption, esp of fat soluble vitamins and iron, may occur.  .Marland KitchenCARDIOVASCULAR STRESS TEST  01/30/14   Myocardial perfusion imaging: NORMAL (Dr. MDomenic Polite.  . CHOLECYSTECTOMY  05/2016  Open.    Marland Kitchen COLON RESECTION Right    right colon resection and ileocolonic anastamosis  . COLONOSCOPY N/A 07/02/2013   Procedure: COLONOSCOPY;  Surgeon: Rogene Houston, MD;  [facility];  Service: Endoscopy;  Laterality: N/A;  200  . ESOPHAGEAL DILATION N/A 10/30/2014   Procedure: ESOPHAGEAL DILATION;  Surgeon: Rogene Houston, MD;  [facility];  Service: Endoscopy;  Laterality: N/A;  . ESOPHAGEAL DILATION N/A 02/12/2015   Procedure: ESOPHAGEAL DILATION;  Surgeon: Rogene Houston, MD;  [facility];  Service: Endoscopy;  Laterality: N/A;  . ESOPHAGEAL DILATION N/A 01/21/2016   Procedure: ESOPHAGEAL DILATION;  Surgeon: Rogene Houston, MD;  [facility];  Service:  Endoscopy;  Laterality: N/A;  . ESOPHAGEAL DILATION N/A 10/13/2016   H pylori neg.  Esophagitis, esoph stricture, and portal hypertensive gastropathy noted.  Procedure: ESOPHAGEAL DILATION;  Surgeon: Rogene Houston, MD;  [facility];  Service: Endoscopy;  Laterality: N/A;  . ESOPHAGOGASTRODUODENOSCOPY N/A 08/14/2013   Procedure: ESOPHAGOGASTRODUODENOSCOPY (EGD);  Surgeon: Rogene Houston, MD;  [facility];  Service: Endoscopy;  Laterality: N/A;  300  . ESOPHAGOGASTRODUODENOSCOPY N/A 10/30/2014   Procedure: ESOPHAGOGASTRODUODENOSCOPY (EGD);  Surgeon: Rogene Houston, MD;  [facility];  Service: Endoscopy;  Laterality: N/A;  940  . ESOPHAGOGASTRODUODENOSCOPY N/A 02/12/2015   Procedure: ESOPHAGOGASTRODUODENOSCOPY (EGD);  Surgeon: Rogene Houston, MD;  [facility];  Service: Endoscopy;  Laterality: N/A;  105  . ESOPHAGOGASTRODUODENOSCOPY  01/21/16   gastritis/reactive gastropathy.  H pylori neg.  Bx benign.  . ESOPHAGOGASTRODUODENOSCOPY N/A 01/21/2016   Procedure: ESOPHAGOGASTRODUODENOSCOPY (EGD);  Surgeon: Rogene Houston, MD;  [facility];  Service: Endoscopy;  Laterality: N/A;  1:10 - moved to 5/26 @ 8:25  . ESOPHAGOGASTRODUODENOSCOPY  10/13/2016   Benign gastric mucosa.  Chronic inactive gastritis.  H pylori NEG (Dr. Laural Golden)  . ESOPHAGOGASTRODUODENOSCOPY (EGD) WITH PROPOFOL N/A 10/13/2016   Procedure: ESOPHAGOGASTRODUODENOSCOPY (EGD) WITH PROPOFOL;  Surgeon: Rogene Houston, MD;  [facility];  Service: Endoscopy;  Laterality: N/A;  9:15  . HIATAL HERNIA REPAIR  09/2007   He underwent repair of large hiatal hernia and pyloroplasty which was complicated by pyloroplasty leak and subsequent gastrojejunostomy  . NISSEN FUNDOPLICATION     x2  . NOCTURNAL POLYSOMNOGRAM  05/2013   Severe periodic limb movement d/o: trial of dopamine agonist such as requip was recommended.  Marland Kitchen PYLOROPLASTY  2009  . TRANSTHORACIC ECHOCARDIOGRAM  12/2013;  12/2016   2015: EF 65-70%, mild LVH, grade I DD, no valvular problems.  2018: EF 55-60%, no wall motion abnormalities, valves normal except aortic leaflets and annulus calcifications w/out any stenosis or regurg.    Outpatient Medications Prior to Visit  Medication Sig Dispense Refill  . FLUoxetine (PROZAC) 40 MG capsule Take 1 capsule (40 mg total) by mouth daily. 30 capsule 1  . folic acid (FOLVITE) 1 MG tablet Take 1 tablet by mouth daily.    Marland Kitchen gabapentin (NEURONTIN) 300 MG capsule 1 capsule in the morning, noon and evening, and 2 capsules at bedtime. 120 capsule 3  . loperamide (IMODIUM A-D) 2 MG tablet Take 1 tablet (2 mg total) by mouth 3 (three) times daily before meals. 30 tablet 0  . loratadine (CLARITIN) 10 MG tablet Take 10 mg by mouth daily as needed for allergies.     Marland Kitchen LORazepam (ATIVAN) 1 MG tablet Take 1 mg by mouth 2 (two) times daily.    Marland Kitchen  Multiple Vitamin (MULTIVITAMIN WITH MINERALS) TABS tablet Take 1 tablet by mouth daily.    Marland Kitchen omeprazole (PRILOSEC) 20 MG capsule Take 1 capsule (20 mg total) by mouth daily. 30 capsule 5  . sucralfate (CARAFATE) 1 g tablet Take 2 tablets (2 g total) by mouth at bedtime. 60 tablet 3  . Thiamine Mononitrate (VITAMIN B1) 100 MG TABS Take 1 tablet by mouth daily.     No facility-administered medications prior to visit.     Allergies  Allergen Reactions  . Meloxicam     Other reaction(s): Unknown Per daughter   . Tramadol     Other reaction(s): Unknown  . Acetaminophen Other (See Comments)    Liver issues  . Norvasc [Amlodipine] Other (See Comments)    "felt bad"    ROS As per HPI  PE: Blood pressure 101/61, pulse 77, temperature 97.9 F (36.6 C), temperature source Oral, resp. rate 16, height 6' (1.829 m), weight 156 lb (70.8 kg), SpO2 98 %. Gen: Alert, well appearing.  Patient is oriented to person, place, time, and situation. HEN:IDPO: no injection, icteris, swelling, or exudate.  EOMI, PERRLA. Mouth: lips without  lesion/swelling.  Oral mucosa pink and moist. Oropharynx without erythema, exudate, or swelling.  CV: RRR, soft systolic murmur, no diastolic murmur, S1 and S2 distinct, no rub or gallop. Chest is clear, no wheezing or rales. Normal symmetric air entry throughout both lung fields. No chest wall deformities or tenderness.   LABS:  Lab Results  Component Value Date   TESTOSTERONE 512 03/20/2016    Lab Results  Component Value Date   INR 0.95 03/13/2013   INR 1.21 10/02/2011   INR 1.11 06/29/2011    Lab Results  Component Value Date   VITAMINB12 1,426 (H) 01/04/2017    Lab Results  Component Value Date   TSH 2.56 03/20/2016   Lab Results  Component Value Date   WBC 5.5 01/04/2017   HGB 10.7 (L) 01/04/2017   HCT 32.2 (L) 01/04/2017   MCV 85.9 01/04/2017   PLT 146 (L) 01/04/2017   Lab Results  Component Value Date   IRON 108 01/04/2017   TIBC 260 01/04/2017   FERRITIN 18 (L) 01/04/2017    Lab Results  Component Value Date   CREATININE 1.33 (H) 01/04/2017   BUN 12 01/04/2017   NA 135 01/04/2017   K 3.6 01/04/2017   CL 105 01/04/2017   CO2 24 01/04/2017   Lab Results  Component Value Date   ALT 24 01/04/2017   AST 35 01/04/2017   ALKPHOS 118 01/04/2017   BILITOT 1.7 (H) 01/04/2017   Lab Results  Component Value Date   CHOL 165 01/24/2014   Lab Results  Component Value Date   HDL 48 01/24/2014   Lab Results  Component Value Date   LDLCALC 90 01/24/2014   Lab Results  Component Value Date   TRIG 135 01/24/2014   Lab Results  Component Value Date   CHOLHDL 3.4 01/24/2014   Lab Results  Component Value Date   PSA 1.43 03/20/2016     IMPRESSION AND PLAN:  1) Depression and anxiety: he is moderately improved but has some flattening of affect sporadically as well as loss of motivation at times---both of which were occurring prior to getting on antidepressant.   We'll continue fluoxetine 18m qd.  Dr. RLaural Goldenhas been refilling his ativan, most  recently filled 12/29/16.  2) Diarrhea: improved on imodium tid.  Suspect this is multifactorial (billroth II procedure, R  colectomy, possible bile acid malabsorption since getting cholecystectomy, ? Pancreatic insufficiency).  Recent vit E and A testing showed deficiency of these, vit K was borderline low, and vit D was normal.  Iron was low.  Suspect the vitamin deficiencies and iron def is due to loss of duodenal continuity.  Will discuss with Dr. Laural Golden the best way to approach treatment of these deficiencies (oral vs parenteral).  Also, I'll check a fecal fat (qual and quant) to further eval for pancreatic insufficiency.  Dr. Laural Golden may have more labs to add to further assess this condition.  3) Alcoholism: he continues to abstain--has been "dry" now for about 3 months.  4) Alcoholic liver dz/marked hepatic steatosis: most recent liver panel showed stable hypoalbuminemia, mild hyperbili, and normal transaminases.  Will check PT/INR today. Encouraged continued alcohol abstinence.  5) Normocytic anemia: multifactorial---bone marrow suppression from chronic alc abuse, chronic iron deficiency (secondary to chronic gastritis + malabsorption).  Again, will discuss method of iron supplementation with his GI MD, Dr. Laural Golden.  6) Fatigue: due to chronic debilitation/malnutrition from living a life of alcoholism up until 3 mo ago. He has a hx of hypogonadism and says testost replacement in the past made him feel better. However, our last testost level check 02/2016 was well wnl. As per his request today, will recheck testost levels today.  An After Visit Summary was printed and given to the patient.  FOLLOW UP: Return in about 2 months (around 03/13/2017) for routine chronic illness f/u (30 min).  Signed:  Crissie Sickles, MD           01/11/2017

## 2017-01-12 LAB — TESTOSTERONE TOTAL,FREE,BIO, MALES
ALBUMIN: 2.5 g/dL — AB (ref 3.6–5.1)
SEX HORMONE BINDING: 87 nmol/L — AB (ref 22–77)
TESTOSTERONE: 442 ng/dL (ref 250–827)
Testosterone, Bioavailable: 30.6 ng/dL — ABNORMAL LOW (ref 110.0–575.0)
Testosterone, Free: 25.8 pg/mL — ABNORMAL LOW (ref 46.0–224.0)

## 2017-01-12 LAB — PROTIME-INR
INR: 1.3 — AB
Prothrombin Time: 14.1 s — ABNORMAL HIGH (ref 9.0–11.5)

## 2017-01-16 ENCOUNTER — Other Ambulatory Visit: Payer: Self-pay | Admitting: Family Medicine

## 2017-01-16 ENCOUNTER — Other Ambulatory Visit: Payer: Self-pay

## 2017-01-16 DIAGNOSIS — K909 Intestinal malabsorption, unspecified: Secondary | ICD-10-CM | POA: Diagnosis not present

## 2017-01-19 ENCOUNTER — Other Ambulatory Visit: Payer: Self-pay | Admitting: Family Medicine

## 2017-01-19 ENCOUNTER — Telehealth: Payer: Self-pay | Admitting: Family Medicine

## 2017-01-19 DIAGNOSIS — K909 Intestinal malabsorption, unspecified: Secondary | ICD-10-CM

## 2017-01-19 MED ORDER — VITAMIN A 8000 UNITS PO CAPS: 8000.0000 [IU] | ORAL_CAPSULE | Freq: Every day | ORAL | 0 refills | Status: AC

## 2017-01-19 MED ORDER — VITAMIN E 1000 UNITS PO CAPS: 1000.0000 [IU] | ORAL_CAPSULE | Freq: Every day | ORAL | 0 refills | Status: DC

## 2017-01-19 NOTE — Telephone Encounter (Signed)
I ordered a fecal fat study recently for Gary Vasquez. His GI MD asked that I try to add on a fecal elastase test.  Can you see if this can be added?  Same dx as for the fecal fat test.--thx

## 2017-01-19 NOTE — Telephone Encounter (Signed)
SW Terri at [facility] and she stated that that test requires refrigerated sample and the same they have has been frozen. She stated that they would not be able to add on that test.

## 2017-01-19 NOTE — Telephone Encounter (Signed)
Pls call pt's daughter and tell her that Dr. Laural Golden got back to me today. He recommended we give the vitamin A, vitamin E, and iron by mouth---not injections. I eRx'd vit A and Vit E tabs to his pharmacy, and I recommend he get otc ferrous sulfate 362m tab and take 1 tab daily.   Also, Dr. RLaural Goldenwanted another stool test that I tried to add on to his current order but they are unable to do the test b/c it has to be done on stool that has not been frozen (his has been frozen). So, I've put the order in for the stool test Dr. RLaural Goldenwants, and we need pt to submit another stool sample. I will send a message to Dr. RLaural Goldento see if pt can pick up collection container at his office.  Then pt can take sample to [facility] [place]--thx

## 2017-01-23 NOTE — Telephone Encounter (Signed)
Pts daughter advised and voiced understanding. Waiting to see where pt will pick up kit and what kit is needed.

## 2017-01-24 LAB — FECAL FAT, QUANTITATIVE

## 2017-01-24 NOTE — Telephone Encounter (Signed)
Spoke to [place] at [facility] and she confirmed that the lab can add this test.  We should have results Monday or Tuesday of next week.

## 2017-01-24 NOTE — Telephone Encounter (Signed)
FYI.   Pts daughter advised that pt does not need to collect another sample.

## 2017-01-26 ENCOUNTER — Other Ambulatory Visit: Payer: Self-pay | Admitting: Family Medicine

## 2017-01-26 DIAGNOSIS — K909 Intestinal malabsorption, unspecified: Secondary | ICD-10-CM

## 2017-01-27 LAB — PANCREATIC ELASTASE, FECAL: Pancreatic Elastase-1, Stool: 321 mcg/g

## 2017-01-30 LAB — OTHER SOLSTAS TEST

## 2017-02-05 ENCOUNTER — Encounter (INDEPENDENT_AMBULATORY_CARE_PROVIDER_SITE_OTHER): Payer: Self-pay

## 2017-02-05 ENCOUNTER — Encounter: Payer: Self-pay | Admitting: Family Medicine

## 2017-02-05 ENCOUNTER — Telehealth: Payer: Self-pay | Admitting: *Deleted

## 2017-02-05 ENCOUNTER — Other Ambulatory Visit: Payer: Self-pay | Admitting: Family Medicine

## 2017-02-05 LAB — FECAL FAT, QUALITATIVE: Fecal Fat Qualitative: ABNORMAL — AB

## 2017-02-05 MED ORDER — TESTOSTERONE 40.5 MG/2.5GM (1.62%) TD GEL: TRANSDERMAL | 5 refills | Status: DC

## 2017-02-05 NOTE — Telephone Encounter (Signed)
Pts daughter would like to know the results of pts PT/INR and Testosterone. Please advise. Thanks.

## 2017-02-05 NOTE — Telephone Encounter (Signed)
OK, I forgot to tell them about these b/c of being side tracked so much with his stool testing. I will send a result note to you about these results.

## 2017-02-06 ENCOUNTER — Telehealth: Payer: Self-pay | Admitting: Family Medicine

## 2017-02-06 NOTE — Telephone Encounter (Signed)
Patient stated he was supposed to have testosterone supplement sent to pharmacy and he went to pick it up today and it was not there.  Please advise.

## 2017-02-07 NOTE — Telephone Encounter (Signed)
Left message for pt to call back  °

## 2017-02-07 NOTE — Telephone Encounter (Signed)
Rx was faxed on 02/05/17 at 17:29, fax did go through. I called pharmacy and they stated that they did not get fax. I have re-faxed Rx today.

## 2017-02-13 ENCOUNTER — Other Ambulatory Visit: Payer: Self-pay | Admitting: Family Medicine

## 2017-02-13 MED ORDER — TESTOSTERONE CYPIONATE 200 MG/ML IM SOLN: 200.0000 mg | INTRAMUSCULAR | 1 refills | Status: DC

## 2017-02-13 NOTE — Telephone Encounter (Signed)
Rx faxed. Left message for pts daughter to call back.

## 2017-02-13 NOTE — Telephone Encounter (Signed)
OK, will print rx for testosterone injections.

## 2017-02-13 NOTE — Telephone Encounter (Signed)
SW pts daughter and she stated that the pharmacy got the Rx for testosterone but it was going to cost pt around $400-$600. Pt was unable to get Rx due to cost. Pts daughter wants to know if Dr. Anitra Lauth will send in a Rx for the injectable testosterone. Please advise. Thanks.

## 2017-02-19 ENCOUNTER — Other Ambulatory Visit: Payer: Self-pay | Admitting: Family Medicine

## 2017-02-19 ENCOUNTER — Other Ambulatory Visit (INDEPENDENT_AMBULATORY_CARE_PROVIDER_SITE_OTHER): Payer: Self-pay | Admitting: Internal Medicine

## 2017-02-19 NOTE — Telephone Encounter (Signed)
Pts daughter LMOM on 02/19/17 at 12:45pm asking about medication for injectable testosterone. She stated that I could call pt on his 248 number. Left message for pt to call back.   Pts daughter advised and voiced understanding. She stated that she will advise pt.

## 2017-03-09 ENCOUNTER — Ambulatory Visit: Payer: Medicare Other | Admitting: Family Medicine

## 2017-03-12 ENCOUNTER — Ambulatory Visit: Payer: Medicare Other | Admitting: Family Medicine

## 2017-03-22 ENCOUNTER — Encounter: Payer: Self-pay | Admitting: Family Medicine

## 2017-03-22 ENCOUNTER — Ambulatory Visit (INDEPENDENT_AMBULATORY_CARE_PROVIDER_SITE_OTHER): Payer: Medicare Other | Admitting: Family Medicine

## 2017-03-22 ENCOUNTER — Telehealth: Payer: Self-pay | Admitting: *Deleted

## 2017-03-22 VITALS — BP 101/59 | HR 100 | Temp 98.6°F | Resp 16 | Ht 72.0 in | Wt 166.5 lb

## 2017-03-22 DIAGNOSIS — K909 Intestinal malabsorption, unspecified: Secondary | ICD-10-CM

## 2017-03-22 DIAGNOSIS — F1020 Alcohol dependence, uncomplicated: Secondary | ICD-10-CM | POA: Diagnosis not present

## 2017-03-22 DIAGNOSIS — E291 Testicular hypofunction: Secondary | ICD-10-CM

## 2017-03-22 DIAGNOSIS — M792 Neuralgia and neuritis, unspecified: Secondary | ICD-10-CM

## 2017-03-22 DIAGNOSIS — E569 Vitamin deficiency, unspecified: Secondary | ICD-10-CM | POA: Diagnosis not present

## 2017-03-22 DIAGNOSIS — K709 Alcoholic liver disease, unspecified: Secondary | ICD-10-CM | POA: Diagnosis not present

## 2017-03-22 DIAGNOSIS — K529 Noninfective gastroenteritis and colitis, unspecified: Secondary | ICD-10-CM | POA: Diagnosis not present

## 2017-03-22 DIAGNOSIS — F329 Major depressive disorder, single episode, unspecified: Secondary | ICD-10-CM

## 2017-03-22 DIAGNOSIS — D508 Other iron deficiency anemias: Secondary | ICD-10-CM

## 2017-03-22 DIAGNOSIS — F419 Anxiety disorder, unspecified: Secondary | ICD-10-CM | POA: Diagnosis not present

## 2017-03-22 LAB — COMPREHENSIVE METABOLIC PANEL
ALT: 17 U/L (ref 0–53)
AST: 18 U/L (ref 0–37)
Albumin: 2 g/dL — ABNORMAL LOW (ref 3.5–5.2)
Alkaline Phosphatase: 108 U/L (ref 39–117)
BUN: 9 mg/dL (ref 6–23)
CALCIUM: 7.6 mg/dL — AB (ref 8.4–10.5)
CO2: 20 meq/L (ref 19–32)
Chloride: 110 mEq/L (ref 96–112)
Creatinine, Ser: 1.48 mg/dL (ref 0.40–1.50)
GFR: 50.91 mL/min — AB (ref >60.00)
GLUCOSE: 191 mg/dL — AB (ref 70–99)
POTASSIUM: 3.7 meq/L (ref 3.5–5.1)
Sodium: 135 mEq/L (ref 135–145)
Total Bilirubin: 0.9 mg/dL (ref 0.2–1.2)
Total Protein: 5.8 g/dL — ABNORMAL LOW (ref 6.0–8.3)

## 2017-03-22 LAB — IRON AND TIBC
%SAT: 26 % (ref 15–60)
Iron: 44 ug/dL — ABNORMAL LOW (ref 50–180)
TIBC: 167 ug/dL — AB (ref 250–425)
UIBC: 123 ug/dL

## 2017-03-22 LAB — CBC WITH DIFFERENTIAL/PLATELET
BASOS ABS: 0.1 10*3/uL (ref 0.0–0.1)
BASOS PCT: 1.3 % (ref 0.0–3.0)
EOS PCT: 0.1 % (ref 0.0–5.0)
Eosinophils Absolute: 0 10*3/uL (ref 0.0–0.7)
HEMATOCRIT: 30.5 % — AB (ref 39.0–52.0)
Hemoglobin: 10.2 g/dL — ABNORMAL LOW (ref 13.0–17.0)
LYMPHS ABS: 2.6 10*3/uL (ref 0.7–4.0)
Lymphocytes Relative: 35.8 % (ref 12.0–46.0)
MCHC: 33.6 g/dL (ref 30.0–36.0)
MCV: 91.6 fl (ref 78.0–100.0)
MONOS PCT: 8.8 % (ref 3.0–12.0)
Monocytes Absolute: 0.6 10*3/uL (ref 0.1–1.0)
NEUTROS ABS: 4 10*3/uL (ref 1.4–7.7)
NEUTROS PCT: 54 % (ref 43.0–77.0)
PLATELETS: 153 10*3/uL (ref 150.0–400.0)
RBC: 3.33 Mil/uL — ABNORMAL LOW (ref 4.22–5.81)
RDW: 19.5 % — AB (ref 11.5–15.5)
WBC: 7.3 10*3/uL (ref 4.0–10.5)

## 2017-03-22 LAB — FERRITIN: FERRITIN: 22 ng/mL (ref 22.0–322.0)

## 2017-03-22 LAB — IRON: Iron: 53 ug/dL (ref 42–165)

## 2017-03-22 MED ORDER — CHOLESTYRAMINE 4 G PO PACK: 4.0000 g | PACK | Freq: Three times a day (TID) | ORAL | 3 refills | Status: DC

## 2017-03-22 NOTE — Telephone Encounter (Signed)
Pts daughter Kenton Kingfisher on 03/22/17 at 12:10pm. She stated that she will not be at pts apt today but wanted to let Dr. Anitra Lauth know a few things.   1) Pt has not been drinking. 2) Pt has mentioned that he would like to quit smoking. 3) Pt has had no energy, she feels his medications need to be adjusted. She stated that one day he seemed "high on medication". She mentioned the gabapentin.  4) She wants to have his liver, testosterone and vitamin levels checked.  5) She stated that pt is still having stomach problems. She stated that he has been taking the Imodium as he GI doctor has advised but he still has bouts of diarrhea. She stated that he has not had much trouble with vomiting or having food come back up. 6) She has been managing pts medication, putting them in his pill box every week. She stated that she is going be in school soon which is three hours away so she will not be able to manage his medications. She has requested that we send any refills to [facility] they are going to put bubble packs together for pt. 7) She stated that they somehow got off schedule on his lorazepam by a few days and will need a refill sooner.   She would like a call back after pts visit to review.   Phone: [phone]

## 2017-03-22 NOTE — Patient Instructions (Signed)
Gabapentin: take one 300 mg tab twice a day for 10 days, then take one 300 mg tab ONCE a day for 10 days, then stop this med altogether.

## 2017-03-22 NOTE — Progress Notes (Signed)
OFFICE VISIT  03/22/2017   CC:  Chief Complaint  Gary Vasquez presents with  . Follow-up    RCI   HPI:    Gary Vasquez is a 66 y.o. 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 I rx'd androgel 01/2017 but this was too costly so I rx'd testost injections. Started oral supplementation of vit A, E, and iron a couple months ago.    He has not been drinking any alcohol since I saw him last.  Last test inj was 7-10d ago.  Says he feels about the same, maybe a little bit better so far.  Anx/dep: increasing prozac to 31m has helped some with anxiety and mood. Ativan : takes this 1 tab po bid---Dr. RLaural Goldenhas been rx'ing/filling this med.  Diarrhea hasn't changed much per pt: still taking imodium three times a day.  Usual has 8-10 small BMs per day.  ROS: occ brief, sharp RUQ pains when he coughs or stretches his abd/torso. No jaundice, no CP, no SOB, no n/v.  No HAs, no focal weakness. Has chronic LBP with pain radiating down legs (alternating) which impairs his sleep. Says neurontin has not helped this so he is agreeable to weening off this med.  Tramadol used to help this but I (and other providers) refuse to prescribe any prescription pain meds to him since he has hx of alcohol addiction. No focal weakness. +Generalized fatigue--chronic. No vision or hearing problems.    Past Medical History:  Diagnosis Date  . Alcoholic hepatitis   . Alcoholic liver disease ([facility]   . Alcoholic pancreatitis   . Alcoholism ([place]    Still drinking as of 09/2016  . Allergy   . Anxiety   . Anxiety and depression   . Arthritis   . Aspiration pneumonitis ([facility])    recurrent  . Bile reflux esophagitis    Chronic; due to gastrojejunostomy.  [facility]. He decided not to proceed with surgery because of high risk. Metoclopramide resulted in diarrhea and dominant and domperodone was ineffective.   . Chronic back pain   . Chronic diarrhea   . Cro[place] disease ([place]    in remission since approx 1995  . DDD (degenerative disc disease), cervical 02/2013   MRI by Dr. DJoneen Caraway . DDD (degenerative disc disease), lumbar    L > R lumbar radiculopathy  . Depression   . Dumping syndrome   . Encephalomalacia on imaging study 02/2013   MRI brain w/o contrast: Mild progression of left occipital lobe encephalomalacia and  . Essential hypertension, benign   . Fatty liver 02/2013; 03/2016   Abd u/s.  Also noted on CT's of abdomen.  . Gastroparesis   . GERD (gastroesophageal reflux disease)   . Heart murmur 2018   ECHO: normal except calcified aortic annulus and leaflets, w/out any stenosis or compromise of valve function.  .Marland KitchenHistory of esophageal stricture    Recurrent--at GE junction.  Has required multiple dilation procedures (most recent 10/13/16).  Esophagrams have shown normal swallowing function.  .Marland KitchenHistory of kidney stones   . History of liver failure    (alchoholic) Recovered in hosp  . Hypogonadism male    Androgel 1.62%, 2 pumps qd started 02/05/17  .  Migraine headache   . Paraesophageal hernia   . Sleep apnea    cannot tolerate CPAP  . Stroke [facility])    short term memory loss.  . Subdural hematoma ([place])   . TIA (transient ischemic attack)     Past Surgical History:  Procedure Laterality Date  . BILROTH II PROCEDURE     with gastrojejunostomy.  *Loss of duodenal continuity--malabsorption, esp of fat soluble vitamins and iron, may occur.  Marland Kitchen CARDIOVASCULAR STRESS TEST  01/30/14   Myocardial perfusion imaging: NORMAL ([name]).  . CHOLECYSTECTOMY  05/2016   Open.    . COLON RESECTION Right    right colon resection and ileocolonic anastamosis  . COLONOSCOPY N/A  07/02/2013   Procedure: COLONOSCOPY;  Surgeon: Rogene Houston, MD;  [facility];  Service: Endoscopy;  Laterality: N/A;  200  . ESOPHAGEAL DILATION N/A 10/30/2014   Procedure: ESOPHAGEAL DILATION;  Surgeon: Rogene Houston, MD;  [facility];  Service: Endoscopy;  Laterality: N/A;  . ESOPHAGEAL DILATION N/A 02/12/2015   Procedure: ESOPHAGEAL DILATION;  Surgeon: Rogene Houston, MD;  [facility];  Service: Endoscopy;  Laterality: N/A;  . ESOPHAGEAL DILATION N/A 01/21/2016   Procedure: ESOPHAGEAL DILATION;  Surgeon: Rogene Houston, MD;  [facility];  Service: Endoscopy;  Laterality: N/A;  . ESOPHAGEAL DILATION N/A 10/13/2016   H pylori neg.  Esophagitis, esoph stricture, and portal hypertensive gastropathy noted.  Procedure: ESOPHAGEAL DILATION;  Surgeon: Rogene Houston, MD;  [facility];  Service: Endoscopy;  Laterality: N/A;  . ESOPHAGOGASTRODUODENOSCOPY N/A 08/14/2013   Procedure: ESOPHAGOGASTRODUODENOSCOPY (EGD);  Surgeon: Rogene Houston, MD;  [facility];  Service: Endoscopy;  Laterality: N/A;  300  . ESOPHAGOGASTRODUODENOSCOPY N/A 10/30/2014   Procedure: ESOPHAGOGASTRODUODENOSCOPY (EGD);  Surgeon: Rogene Houston, MD;  [facility];  Service: Endoscopy;  Laterality: N/A;  940  . ESOPHAGOGASTRODUODENOSCOPY N/A 02/12/2015   Procedure: ESOPHAGOGASTRODUODENOSCOPY (EGD);  Surgeon: Rogene Houston, MD;  [facility];  Service: Endoscopy;  Laterality: N/A;  105  . ESOPHAGOGASTRODUODENOSCOPY  01/21/16   gastritis/reactive gastropathy.  H pylori neg.  Bx benign.  . ESOPHAGOGASTRODUODENOSCOPY N/A 01/21/2016   Procedure: ESOPHAGOGASTRODUODENOSCOPY (EGD);  Surgeon: Rogene Houston, MD;  [facility];  Service: Endoscopy;  Laterality: N/A;  1:10 - moved to 5/26 @ 8:25  . ESOPHAGOGASTRODUODENOSCOPY  10/13/2016   Benign gastric mucosa.  Chronic inactive gastritis.  H pylori NEG (Dr. Laural Golden)  .  ESOPHAGOGASTRODUODENOSCOPY (EGD) WITH PROPOFOL N/A 10/13/2016   Procedure: ESOPHAGOGASTRODUODENOSCOPY (EGD) WITH PROPOFOL;  Surgeon: Rogene Houston, MD;  [facility];  Service: Endoscopy;  Laterality: N/A;  9:15  . HIATAL HERNIA REPAIR  09/2007   He underwent repair of large hiatal hernia and pyloroplasty which was complicated by pyloroplasty leak and subsequent gastrojejunostomy  . NISSEN FUNDOPLICATION     x2  . NOCTURNAL POLYSOMNOGRAM  05/2013   Severe periodic limb movement d/o: trial of dopamine agonist such as requip was recommended.  Marland Kitchen PYLOROPLASTY  2009  . TRANSTHORACIC ECHOCARDIOGRAM  12/2013; 12/2016   2015: EF 65-70%, mild LVH, grade I DD, no valvular problems.  2018: EF 55-60%, no wall motion abnormalities, valves normal except aortic leaflets and annulus calcifications w/out any stenosis or regurg.    Outpatient Medications Prior to Visit  Medication Sig Dispense Refill  . FLUoxetine (PROZAC) 40 MG capsule TAKE ONE CAPSULE BY MOUTH DAILY. 30 capsule 1  . folic acid (  FOLVITE) 1 MG tablet Take 1 tablet by mouth daily.    Marland Kitchen gabapentin (NEURONTIN) 300 MG capsule 1 capsule in the morning, noon and evening, and 2 capsules at bedtime. 120 capsule 3  . loperamide (IMODIUM A-D) 2 MG tablet Take 1 tablet (2 mg total) by mouth 3 (three) times daily before meals. 30 tablet 0  . loratadine (CLARITIN) 10 MG tablet Take 10 mg by mouth daily as needed for allergies.     Marland Kitchen LORazepam (ATIVAN) 1 MG tablet Take 1 mg by mouth 2 (two) times daily.    . Multiple Vitamin (MULTIVITAMIN WITH MINERALS) TABS tablet Take 1 tablet by mouth daily.    Marland Kitchen omeprazole (PRILOSEC) 20 MG capsule Take 1 capsule (20 mg total) by mouth daily. 30 capsule 5  . ondansetron (ZOFRAN) 4 MG tablet TAKE ONE TABLET BY MOUTH TWICE DAILY AS NEEDED FOR NAUSEA OR VOMITING. DO NOT TAKE DAILY. 30 tablet 0  . sucralfate (CARAFATE) 1 g tablet Take 2 tablets (2 g total) by mouth at bedtime. 60 tablet 3  . testosterone cypionate  (DEPOTESTOSTERONE CYPIONATE) 200 MG/ML injection Inject 1 mL (200 mg total) into the muscle every 14 (fourteen) days. 10 mL 1  . Thiamine Mononitrate (VITAMIN B1) 100 MG TABS Take 1 tablet by mouth daily.    . vitamin A 8000 UNIT capsule Take 1 capsule (8,000 Units total) by mouth daily. 90 capsule 0  . vitamin E 1000 UNIT capsule Take 1 capsule (1,000 Units total) by mouth daily. 90 capsule 0   No facility-administered medications prior to visit.     Allergies  Allergen Reactions  . Meloxicam     Other reaction(s): Unknown Per daughter   . Tramadol     Other reaction(s): Unknown  . Acetaminophen Other (See Comments)    Liver issues  . Norvasc [Amlodipine] Other (See Comments)    "felt bad"    ROS As per HPI  PE: Blood pressure (!) 101/59, pulse 100, temperature 98.6 F (37 C), temperature source Oral, resp. rate 16, height 6' (1.829 m), weight 166 lb 8 oz (75.5 kg), SpO2 97 %. Gen: Alert, well appearing.  Gary Vasquez is oriented to person, place, time, and situation. AFFECT: pleasant, lucid thought and speech. FXO:VANV: no injection, icteris, swelling, or exudate.  EOMI, PERRLA. Mouth: lips without lesion/swelling.  Oral mucosa pink and moist. Oropharynx without erythema, exudate, or swelling.  CV: RRR, soft systolic murmur heard best at RUSB, no rub or gallop. Chest is clear, no wheezing or rales. Normal symmetric air entry throughout both lung fields. No chest wall deformities or tenderness. ABD: soft, NT/ND, BS a bit hyperactive. EXT: no clubbing, cyanosis, or edema.  Skin - no sores or suspicious lesions or rashes or color changes MUSC: diffuse loss of muscle tone with some muscle atrophy in arms and neck.  LABS:    Chemistry      Component Value Date/Time   NA 135 01/04/2017 1151   NA 137 11/07/2016   K 3.6 01/04/2017 1151   CL 105 01/04/2017 1151   CO2 24 01/04/2017 1151   BUN 12 01/04/2017 1151   BUN 16 11/07/2016   CREATININE 1.33 (H) 01/04/2017 1151    CREATININE 0.83 02/09/2016 1431      Component Value Date/Time   CALCIUM 8.4 (L) 01/04/2017 1151   ALKPHOS 118 01/04/2017 1151   AST 35 01/04/2017 1151   ALT 24 01/04/2017 1151   BILITOT 1.7 (H) 01/04/2017 1151     Lab Results  Component Value Date   WBC 5.5 01/04/2017   HGB 10.7 (L) 01/04/2017   HCT 32.2 (L) 01/04/2017   MCV 85.9 01/04/2017   PLT 146 (L) 01/04/2017   Lab Results  Component Value Date   INR 1.3 (H) 01/11/2017   INR 0.95 03/13/2013   INR 1.21 10/02/2011   IMPRESSION AND PLAN:  1) Alcoholism: he is continuing to abstain from alcohol use.  Congratulated pt on this today.  2) Alcoholic liver dz: hepatic steatosis + hx of alcoholic hepatitis with ascites. No sign of acute liver dz or ascites at this time.  Check CMET today.  3) Chronic diarrhea: he has had this for years.  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 He is still having quite a bit of small volume loose BMs.  Continue imodium tid, and I will add cholestyramine 4 g tid. If constipation begins to occur, he is to stop the imodium first, then if needed he should cut back on cholestyramine dosing.    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  Has been on oral supplement of A, E, and iron for about 2 mo.  Recheck vit D, E, and iron labs today.  5) Normocytic anemia: chronic alcohol abuse suppression of bone marrow + iron deficiency from malabsorption (plus likely some inadequate intake of iron while he was drinking a lot).  Recheck iron panel/ferritin/CBC today.  6) Anxiety and depression: The current medical regimen is effective;  continue present plan and medications. Dr. Laural Golden will be refilling pt's ativan when he needs it.  7) Chronic low back pain with radiculopathy: impairing sleep.  No narcotics.   Ween off neurontin since this is not helping any. Instructions:  Gabapentin: take one 300 mg tab twice a day for 10 days, then take one 300 mg tab ONCE a day for 10 days, then stop this med altogether. He says he will soon be going to a pain clinic in [place], [place]---his home town as of now.  8) Male hypogonadism: just started testosterone injections: will recheck testost levels today (most recent injection was 7-10 d/a).  An After Visit Summary was printed and given to the Gary Vasquez.  FOLLOW UP: Return in about 2 months (around 05/23/2017) for routine chronic illness f/u (30 min).  Signed:  Crissie Sickles, MD           03/22/2017

## 2017-03-23 ENCOUNTER — Other Ambulatory Visit (INDEPENDENT_AMBULATORY_CARE_PROVIDER_SITE_OTHER): Payer: Self-pay

## 2017-03-23 DIAGNOSIS — R7309 Other abnormal glucose: Secondary | ICD-10-CM

## 2017-03-23 LAB — TESTOSTERONE TOTAL,FREE,BIO, MALES
ALBUMIN: 2 g/dL — AB (ref 3.6–5.1)
SEX HORMONE BINDING: 63 nmol/L (ref 22–77)
TESTOSTERONE FREE: 110.5 pg/mL (ref 46.0–224.0)
Testosterone, Bioavailable: 107.2 ng/dL — ABNORMAL LOW (ref 110.0–575.0)
Testosterone: 1114 ng/dL — ABNORMAL HIGH (ref 250–827)

## 2017-03-23 LAB — HEMOGLOBIN A1C: Hgb A1c MFr Bld: 4.8 % (ref 4.6–6.5)

## 2017-03-26 ENCOUNTER — Telehealth: Payer: Self-pay | Admitting: *Deleted

## 2017-03-26 NOTE — Telephone Encounter (Signed)
Opened in error

## 2017-03-27 LAB — VITAMIN E: VITAMIN E (ALPHA TOCOPHEROL): 7.9 mg/L (ref 5.7–19.9)

## 2017-03-27 LAB — VITAMIN A: VITAMIN A (RETINOIC ACID): 7 ug/dL — AB (ref 38–98)

## 2017-03-29 LAB — VITAMIN A: Vitamin A (Retinoic Acid): 5.5 ug/dL — ABNORMAL LOW (ref 36.4–108.0)

## 2017-03-29 LAB — VITAMIN E
VITAMIN E(GAMMA TOCOPHEROL): 1.4 mg/L (ref 0.5–4.9)
Vitamin E (Alpha Tocopherol): 8.3 mg/L — ABNORMAL LOW (ref 9.0–29.0)

## 2017-04-05 ENCOUNTER — Telehealth: Payer: Self-pay | Admitting: *Deleted

## 2017-04-05 ENCOUNTER — Other Ambulatory Visit: Payer: Self-pay | Admitting: Family Medicine

## 2017-04-05 DIAGNOSIS — R110 Nausea: Secondary | ICD-10-CM | POA: Diagnosis not present

## 2017-04-05 DIAGNOSIS — R638 Other symptoms and signs concerning food and fluid intake: Secondary | ICD-10-CM | POA: Diagnosis not present

## 2017-04-05 DIAGNOSIS — K567 Ileus, unspecified: Secondary | ICD-10-CM | POA: Diagnosis not present

## 2017-04-05 DIAGNOSIS — R6883 Chills (without fever): Secondary | ICD-10-CM | POA: Diagnosis not present

## 2017-04-05 DIAGNOSIS — R630 Anorexia: Secondary | ICD-10-CM | POA: Diagnosis not present

## 2017-04-05 DIAGNOSIS — R1084 Generalized abdominal pain: Secondary | ICD-10-CM | POA: Diagnosis not present

## 2017-04-05 DIAGNOSIS — R188 Other ascites: Secondary | ICD-10-CM | POA: Diagnosis not present

## 2017-04-05 MED ORDER — TRAZODONE HCL 50 MG PO TABS: ORAL_TABLET | ORAL | 6 refills | Status: DC

## 2017-04-05 NOTE — Telephone Encounter (Signed)
Trazodone has now been eRx'd. Cholestyramine only comes as powder/packets. There is no other alternative.  It is up to him whether or not he chooses to try to continue taking it.

## 2017-04-05 NOTE — Telephone Encounter (Signed)
Pts daughter called stating that Rx for trazodone was not sent to pharmacy. She also stated that pt does not like the cholestyramine packets. She stated that pt has a hard time getting it down and also its costing pt $90. She wants to know if there is something else that can be sent in. Please advise. Thanks.

## 2017-04-05 NOTE — Telephone Encounter (Signed)
Pts daughter advised and voiced understanding. She also wanted to let Dr. Anitra Lauth know that pt is in the hospital now and will most likely have another paracentesis done.   I verbally advised Dr. Anitra Lauth and he voiced understanding.

## 2017-04-09 ENCOUNTER — Telehealth (INDEPENDENT_AMBULATORY_CARE_PROVIDER_SITE_OTHER): Payer: Self-pay | Admitting: Internal Medicine

## 2017-04-09 ENCOUNTER — Encounter: Payer: Self-pay | Admitting: Family Medicine

## 2017-04-09 ENCOUNTER — Ambulatory Visit (INDEPENDENT_AMBULATORY_CARE_PROVIDER_SITE_OTHER): Payer: Medicare Other | Admitting: Family Medicine

## 2017-04-09 VITALS — BP 93/56 | HR 85 | Temp 98.2°F | Resp 16 | Ht 72.0 in | Wt 165.5 lb

## 2017-04-09 DIAGNOSIS — F1020 Alcohol dependence, uncomplicated: Secondary | ICD-10-CM

## 2017-04-09 DIAGNOSIS — E509 Vitamin A deficiency, unspecified: Secondary | ICD-10-CM

## 2017-04-09 DIAGNOSIS — K7031 Alcoholic cirrhosis of liver with ascites: Secondary | ICD-10-CM

## 2017-04-09 DIAGNOSIS — G609 Hereditary and idiopathic neuropathy, unspecified: Secondary | ICD-10-CM

## 2017-04-09 DIAGNOSIS — E291 Testicular hypofunction: Secondary | ICD-10-CM

## 2017-04-09 NOTE — Telephone Encounter (Signed)
Discussed with Dr.Rehman. He ask that whom ever ordered this Paracentesis , the patient contact them and see what their recommendation would be. There are no notes in system to review.  Patient was called and made aware. He says that it was a ED physician, and only 60 cc was drawn to make sure there was no infection. He has appointment with Dr. Anitra Lauth today at 3:15 pm and he will discuss with him, he c/o of his lower extremities being swollen.

## 2017-04-09 NOTE — Progress Notes (Signed)
OFFICE VISIT  04/09/2017   CC:  Chief Complaint  Patient presents with  . Abdominal Swelling    was seen a hospital in [place]   HPI:    Patient is a 66 y.o. Caucasian male who presents for abd swelling, was recently in [place] in [place], [place] for ascites.  He had developed large ascitic abdomen over a week or less, presented to hosp and got paracentesis (just 60 cc for testing, not therapeutic).  He had been hurting in abdomen.  He says abdomen feels less swollen and less painful.  No fevers.  He was told that the fluid was not infected.  No meds were changed or added. He was given oxydocone while in the ED but not sent home on any.  He continues to NOT DRINK (since 09/2016).  His PO intake of solids is great.  He was unable to take the cholestyramine b/c it gagged him. We reviewed his labs again, low vit A and E still, iron labs equivocal. Has still been having some diarrhea BMs as per his usual.  ROS: no dizziness or syncope, no CP, no n/v, some chronic mild LE swelling. He admits he eats a HIGH sodium diet.  His LE peripheral neuropathy pain has returned since we weened him off gabapentin.  Turns out this med was helping after all.  We'll get him back on this med now.  Says trazodone largely unsuccessful as a sleep aid--worked inconsistently, also led to grogginess in morning.   Past Medical History:  Diagnosis Date  . Alcoholic hepatitis   . Alcoholic liver disease ([place])   . Alcoholic pancreatitis   . Alcoholism ([place])    Still drinking as of 09/2016  . Allergy   . Anxiety   . Anxiety and depression   . Arthritis   . Aspiration pneumonitis ([facility])    recurrent  . Bile reflux esophagitis    Chronic; due to gastrojejunostomy.  He was evaluated by Dr. Demetrio Lapping of [facility] last year for Roux-en-Y procedure. He decided not to proceed with surgery because of high risk. Metoclopramide resulted in diarrhea and dominant and domperodone was ineffective.   . Chronic back pain   .  Chronic diarrhea   . Crohn disease ([place])    in remission since approx 1995  . DDD (degenerative disc disease), cervical 02/2013   MRI by Dr. Joneen Caraway  . DDD (degenerative disc disease), lumbar    L > R lumbar radiculopathy  . Depression   . Dumping syndrome   . Encephalomalacia on imaging study 02/2013   MRI brain w/o contrast: Mild progression of left occipital lobe encephalomalacia and  . Essential hypertension, benign   . Fatty liver 02/2013; 03/2016   Abd u/s.  Also noted on CT's of abdomen.  . Gastroparesis   . GERD (gastroesophageal reflux disease)   . Heart murmur 2018   ECHO: normal except calcified aortic annulus and leaflets, w/out any stenosis or compromise of valve function.  Marland Kitchen History of esophageal stricture    Recurrent--at GE junction.  Has required multiple dilation procedures (most recent 10/13/16).  Esophagrams have shown normal swallowing function.  Marland Kitchen History of kidney stones   . History of liver failure    (alchoholic) Recovered in hosp  . Hypogonadism male    Androgel 1.62%, 2 pumps qd started 02/05/17  . Migraine headache   . Paraesophageal hernia   . Sleep apnea    cannot tolerate CPAP  . Stroke [facility])    short term memory  loss.  . Subdural hematoma ([place])   . TIA (transient ischemic attack)     Past Surgical History:  Procedure Laterality Date  . BILROTH II PROCEDURE     with gastrojejunostomy.  *Loss of duodenal continuity--malabsorption, esp of fat soluble vitamins and iron, may occur.  Marland Kitchen CARDIOVASCULAR STRESS TEST  01/30/14   Myocardial perfusion imaging: NORMAL ([name]).  . CHOLECYSTECTOMY  05/2016   Open.    . COLON RESECTION Right    right colon resection and ileocolonic anastamosis  . COLONOSCOPY N/A 07/02/2013   Procedure: COLONOSCOPY;  Surgeon: Rogene Houston, MD;  [facility];  Service: Endoscopy;  Laterality: N/A;  200  . ESOPHAGEAL DILATION N/A 10/30/2014   Procedure: ESOPHAGEAL DILATION;  Surgeon: Rogene Houston, MD;   [facility];  Service: Endoscopy;  Laterality: N/A;  . ESOPHAGEAL DILATION N/A 02/12/2015   Procedure: ESOPHAGEAL DILATION;  Surgeon: Rogene Houston, MD;  [facility];  Service: Endoscopy;  Laterality: N/A;  . ESOPHAGEAL DILATION N/A 01/21/2016   Procedure: ESOPHAGEAL DILATION;  Surgeon: Rogene Houston, MD;  [facility];  Service: Endoscopy;  Laterality: N/A;  . ESOPHAGEAL DILATION N/A 10/13/2016   H pylori neg.  Esophagitis, esoph stricture, and portal hypertensive gastropathy noted.  Procedure: ESOPHAGEAL DILATION;  Surgeon: Rogene Houston, MD;  [facility];  Service: Endoscopy;  Laterality: N/A;  . ESOPHAGOGASTRODUODENOSCOPY N/A 08/14/2013   Procedure: ESOPHAGOGASTRODUODENOSCOPY (EGD);  Surgeon: Rogene Houston, MD;  [facility];  Service: Endoscopy;  Laterality: N/A;  300  . ESOPHAGOGASTRODUODENOSCOPY N/A 10/30/2014   Procedure: ESOPHAGOGASTRODUODENOSCOPY (EGD);  Surgeon: Rogene Houston, MD;  [facility];  Service: Endoscopy;  Laterality: N/A;  940  . ESOPHAGOGASTRODUODENOSCOPY N/A 02/12/2015   Procedure: ESOPHAGOGASTRODUODENOSCOPY (EGD);  Surgeon: Rogene Houston, MD;  [facility];  Service: Endoscopy;  Laterality: N/A;  105  . ESOPHAGOGASTRODUODENOSCOPY  01/21/16   gastritis/reactive gastropathy.  H pylori neg.  Bx benign.  . ESOPHAGOGASTRODUODENOSCOPY N/A 01/21/2016   Procedure: ESOPHAGOGASTRODUODENOSCOPY (EGD);  Surgeon: Rogene Houston, MD;  [facility];  Service: Endoscopy;  Laterality: N/A;  1:10 - moved to 5/26 @ 8:25  . ESOPHAGOGASTRODUODENOSCOPY  10/13/2016   Benign gastric mucosa.  Chronic inactive gastritis.  H pylori NEG (Dr. Laural Golden)  . ESOPHAGOGASTRODUODENOSCOPY (EGD) WITH PROPOFOL N/A 10/13/2016   Procedure: ESOPHAGOGASTRODUODENOSCOPY (EGD) WITH PROPOFOL;  Surgeon: Rogene Houston, MD;  [facility];  Service: Endoscopy;  Laterality: N/A;  9:15  . HIATAL HERNIA REPAIR  09/2007    He underwent repair of large hiatal hernia and pyloroplasty which was complicated by pyloroplasty leak and subsequent gastrojejunostomy  . NISSEN FUNDOPLICATION     x2  . NOCTURNAL POLYSOMNOGRAM  05/2013   Severe periodic limb movement d/o: trial of dopamine agonist such as requip was recommended.  Marland Kitchen PYLOROPLASTY  2009  . TRANSTHORACIC ECHOCARDIOGRAM  12/2013; 12/2016   2015: EF 65-70%, mild LVH, grade I DD, no valvular problems.  2018: EF 55-60%, no wall motion abnormalities, valves normal except aortic leaflets and annulus calcifications w/out any stenosis or regurg.    Outpatient Medications Prior to Visit  Medication Sig Dispense Refill  . FLUoxetine (PROZAC) 40 MG capsule TAKE ONE CAPSULE BY MOUTH DAILY. 30 capsule 1  . folic acid (FOLVITE) 1 MG tablet Take 1 tablet by mouth daily.    Marland Kitchen loperamide (IMODIUM A-D) 2 MG tablet Take 1 tablet (2 mg total) by mouth  3 (three) times daily before meals. 30 tablet 0  . loratadine (CLARITIN) 10 MG tablet Take 10 mg by mouth daily as needed for allergies.     Marland Kitchen LORazepam (ATIVAN) 1 MG tablet Take 1 mg by mouth 2 (two) times daily.    . Multiple Vitamin (MULTIVITAMIN WITH MINERALS) TABS tablet Take 1 tablet by mouth daily.    Marland Kitchen omeprazole (PRILOSEC) 20 MG capsule Take 1 capsule (20 mg total) by mouth daily. 30 capsule 5  . ondansetron (ZOFRAN) 4 MG tablet TAKE ONE TABLET BY MOUTH TWICE DAILY AS NEEDED FOR NAUSEA OR VOMITING. DO NOT TAKE DAILY. 30 tablet 0  . sucralfate (CARAFATE) 1 g tablet Take 2 tablets (2 g total) by mouth at bedtime. 60 tablet 3  . testosterone cypionate (DEPOTESTOSTERONE CYPIONATE) 200 MG/ML injection Inject 1 mL (200 mg total) into the muscle every 14 (fourteen) days. 10 mL 1  . Thiamine Mononitrate (VITAMIN B1) 100 MG TABS Take 1 tablet by mouth daily.    . vitamin A 8000 UNIT capsule Take 1 capsule (8,000 Units total) by mouth daily. 90 capsule 0  . vitamin E 1000 UNIT capsule Take 1 capsule (1,000 Units total) by mouth  daily. 90 capsule 0  . gabapentin (NEURONTIN) 300 MG capsule 1 capsule in the morning, noon and evening, and 2 capsules at bedtime. (Patient not taking: Reported on 04/09/2017) 120 capsule 3  . traZODone (DESYREL) 50 MG tablet 1-2 tabs po qhs for insomnia (Patient not taking: Reported on 04/09/2017) 60 tablet 6  . cholestyramine (QUESTRAN) 4 g packet Take 1 packet (4 g total) by mouth 3 (three) times daily with meals. (Patient not taking: Reported on 04/09/2017) 90 each 3   No facility-administered medications prior to visit.     Allergies  Allergen Reactions  . Meloxicam     Other reaction(s): Unknown Per daughter   . Tramadol     Other reaction(s): Unknown  . Acetaminophen Other (See Comments)    Liver issues  . Norvasc [Amlodipine] Other (See Comments)    "felt bad"    ROS As per HPI  PE: Blood pressure (!) 93/56, pulse 85, temperature 98.2 F (36.8 C), temperature source Oral, resp. rate 16, height 6' (1.829 m), weight 165 lb 8 oz (75.1 kg), SpO2 97 %. Gen: Alert, well appearing.  Patient is oriented to person, place, time, and situation. AFFECT: pleasant, lucid thought and speech. ENT: no icterus.  Oral mucosa moist and pink. CV: soft systolic flow murmur at cardiac base.  No rub/gallop. Chest is clear, no wheezing or rales. Normal symmetric air entry throughout both lung fields. No chest wall deformities or tenderness. ABD: soft, distended mildly, with flank dullness.  Mild diffuse tenderness but not consistent during exam today. The only area with consistent TTP is RUQ.  I cannot palpate the liver edge or the spleen.  BS normal.  No mass or bruit.   EXT: 1+ pitting edema R LL, 2+ edema L LL.  No cyanosis or clubbing. SKIN: no pallor or jaundice.  LABS:   Lab Results  Component Value Date   TESTOSTERONE 1,114 (H) 03/22/2017    Lab Results  Component Value Date   VITAMINB12 1,426 (H) 01/04/2017    Lab Results  Component Value Date   TSH 2.56 03/20/2016   Lab  Results  Component Value Date   WBC 7.3 03/22/2017   HGB 10.2 (L) 03/22/2017   HCT 30.5 (L) 03/22/2017   MCV 91.6 03/22/2017   PLT 153.0  03/22/2017   Lab Results  Component Value Date   CREATININE 1.48 03/22/2017   BUN 9 03/22/2017   NA 135 03/22/2017   K 3.7 03/22/2017   CL 110 03/22/2017   CO2 20 03/22/2017   Lab Results  Component Value Date   ALT 17 03/22/2017   AST 18 03/22/2017   ALKPHOS 108 03/22/2017   BILITOT 0.9 03/22/2017   Lab Results  Component Value Date   CHOL 165 01/24/2014   Lab Results  Component Value Date   HDL 48 01/24/2014   Lab Results  Component Value Date   LDLCALC 90 01/24/2014   Lab Results  Component Value Date   TRIG 135 01/24/2014   Lab Results  Component Value Date   CHOLHDL 3.4 01/24/2014   Lab Results  Component Value Date   PSA 1.43 03/20/2016   Lab Results  Component Value Date   IRON 53 03/22/2017   IRON 44 (L) 03/22/2017   TIBC 167 (L) 03/22/2017   FERRITIN 22.0 03/22/2017   IMPRESSION AND PLAN:  1) Ascites: I don't have high suspicion for SBP.  He has steatohepatitis, alcohol induced, hs of past episode of ascites when he had acute alcoholic hepatitis with subsequent mild liver failure. Will get [employer] records--apparently their analysis of his peritoneal fluid was that it was NOT infected. I don't think he needs therapeutic paracentesis at this time. Encouraged LOW SODIUM diet. No diuretics b/c of hx of low normal bp (and low bp in office today). Check CBC and CMET today as well as abd u/s. He'll keep GI f/u appt scheduled for 04/24/17.  2) Peripheral neuropathy: restart gabapentin at 300 mg qAM and 600 mg qhs.  3) Vit E def: no change in level after a period of supplement--we'll not push the issue with this at this time. Vit A deficiency: plan to increase his supplement to 2 of the 8000 U tabs once daily.  4) Hypogonadism: recheck of testost after getting started on testost injections showed  testost too high, so I cut his dose in half (1/2 ml q14d). Plan on recheck testost 1 week after most recent injection in about 2 mo.  5) Alcoholism: he continues to be in recovery: no alcohol in the last 6 mo.  An After Visit Summary was printed and given to the patient.  FOLLOW UP: Return for keep appt set for 05/15/17 with me..  Signed:  Crissie Sickles, MD           04/09/2017

## 2017-04-09 NOTE — Patient Instructions (Signed)

## 2017-04-09 NOTE — Telephone Encounter (Signed)
Patient called Friday after hours, requesting Dr. Laural Golden or Karna Christmas give him a call.  He stated that he had a Paracentesis done on Thursday and he has been swollen for about 4-5 days.  He doesn't know what he needs to do.     [phone]

## 2017-04-10 LAB — CBC WITH DIFFERENTIAL/PLATELET
BASOS PCT: 1 % (ref 0.0–3.0)
Basophils Absolute: 0.1 10*3/uL (ref 0.0–0.1)
EOS ABS: 0.1 10*3/uL (ref 0.0–0.7)
Eosinophils Relative: 1.6 % (ref 0.0–5.0)
HCT: 34.2 % — ABNORMAL LOW (ref 39.0–52.0)
Hemoglobin: 11.3 g/dL — ABNORMAL LOW (ref 13.0–17.0)
Lymphocytes Relative: 35.4 % (ref 12.0–46.0)
Lymphs Abs: 2.8 10*3/uL (ref 0.7–4.0)
MCHC: 32.9 g/dL (ref 30.0–36.0)
MCV: 92.1 fl (ref 78.0–100.0)
MONO ABS: 0.6 10*3/uL (ref 0.1–1.0)
Monocytes Relative: 7.1 % (ref 3.0–12.0)
NEUTROS ABS: 4.3 10*3/uL (ref 1.4–7.7)
Neutrophils Relative %: 54.9 % (ref 43.0–77.0)
PLATELETS: 170 10*3/uL (ref 150.0–400.0)
RBC: 3.72 Mil/uL — ABNORMAL LOW (ref 4.22–5.81)
RDW: 19.2 % — AB (ref 11.5–15.5)
WBC: 7.9 10*3/uL (ref 4.0–10.5)

## 2017-04-10 LAB — COMPREHENSIVE METABOLIC PANEL
ALK PHOS: 114 U/L (ref 39–117)
ALT: 17 U/L (ref 0–53)
AST: 20 U/L (ref 0–37)
Albumin: 2.3 g/dL — ABNORMAL LOW (ref 3.5–5.2)
BILIRUBIN TOTAL: 1.1 mg/dL (ref 0.2–1.2)
BUN: 7 mg/dL (ref 6–23)
CO2: 25 meq/L (ref 19–32)
CREATININE: 1.29 mg/dL (ref 0.40–1.50)
Calcium: 7.9 mg/dL — ABNORMAL LOW (ref 8.4–10.5)
Chloride: 108 mEq/L (ref 96–112)
GFR: 59.65 mL/min — ABNORMAL LOW (ref >60.00)
GLUCOSE: 81 mg/dL (ref 70–99)
Potassium: 3.9 mEq/L (ref 3.5–5.1)
SODIUM: 137 meq/L (ref 135–145)
TOTAL PROTEIN: 6 g/dL (ref 6.0–8.3)

## 2017-04-11 ENCOUNTER — Ambulatory Visit (HOSPITAL_COMMUNITY)
Admission: RE | Admit: 2017-04-11 | Discharge: 2017-04-11 | Disposition: A | Discharge status: Home / Self Care | Payer: Medicare Other | Source: Ambulatory Visit | Attending: Family Medicine | Admitting: Family Medicine

## 2017-04-11 ENCOUNTER — Encounter: Payer: Self-pay | Admitting: Family Medicine

## 2017-04-11 DIAGNOSIS — K7031 Alcoholic cirrhosis of liver with ascites: Secondary | ICD-10-CM | POA: Insufficient documentation

## 2017-04-11 DIAGNOSIS — Z9049 Acquired absence of other specified parts of digestive tract: Secondary | ICD-10-CM | POA: Diagnosis not present

## 2017-04-19 ENCOUNTER — Telehealth: Payer: Self-pay | Admitting: Family Medicine

## 2017-04-19 NOTE — Telephone Encounter (Signed)
Day surgery faxed form to fill out and then to send back. They will contact patient.

## 2017-04-19 NOTE — Telephone Encounter (Signed)
I want to have him get IV iron (Feraheme) at [facility] for his iron deficiency anemia. Pls contact [facility] oncology (or [facility] if no luck there) to see how we get this set up.

## 2017-04-20 ENCOUNTER — Encounter: Payer: Self-pay | Admitting: Family Medicine

## 2017-04-20 ENCOUNTER — Telehealth: Payer: Self-pay

## 2017-04-20 NOTE — Telephone Encounter (Signed)
Great!

## 2017-04-20 NOTE — Telephone Encounter (Signed)
[facility] called stating that Gary Vasquez has an appointment Tuesday. 04/24/17 for infusion.

## 2017-04-24 ENCOUNTER — Encounter (HOSPITAL_COMMUNITY)
Admission: RE | Admit: 2017-04-24 | Discharge: 2017-04-24 | Disposition: A | Discharge status: Home / Self Care | Payer: Medicare Other | Source: Ambulatory Visit | Attending: Family Medicine | Admitting: Family Medicine

## 2017-04-24 ENCOUNTER — Ambulatory Visit (INDEPENDENT_AMBULATORY_CARE_PROVIDER_SITE_OTHER): Payer: Medicare Other | Admitting: Internal Medicine

## 2017-04-24 ENCOUNTER — Encounter (INDEPENDENT_AMBULATORY_CARE_PROVIDER_SITE_OTHER): Payer: Self-pay | Admitting: Internal Medicine

## 2017-04-24 VITALS — BP 106/60 | HR 76 | Temp 97.0°F | Resp 18 | Ht 72.0 in | Wt 157.3 lb

## 2017-04-24 DIAGNOSIS — G8929 Other chronic pain: Secondary | ICD-10-CM | POA: Diagnosis not present

## 2017-04-24 DIAGNOSIS — R1013 Epigastric pain: Secondary | ICD-10-CM | POA: Diagnosis not present

## 2017-04-24 DIAGNOSIS — R197 Diarrhea, unspecified: Secondary | ICD-10-CM

## 2017-04-24 DIAGNOSIS — K909 Intestinal malabsorption, unspecified: Secondary | ICD-10-CM

## 2017-04-24 DIAGNOSIS — D509 Iron deficiency anemia, unspecified: Secondary | ICD-10-CM | POA: Insufficient documentation

## 2017-04-24 DIAGNOSIS — K219 Gastro-esophageal reflux disease without esophagitis: Secondary | ICD-10-CM | POA: Diagnosis not present

## 2017-04-24 MED ORDER — SODIUM CHLORIDE 0.9 % IV SOLN
Freq: Once | INTRAVENOUS | Status: AC
  Administered 2017-04-24: 250 mL via INTRAVENOUS

## 2017-04-24 MED ORDER — FERUMOXYTOL INJECTION 510 MG/17 ML
510.0000 mg | Freq: Once | INTRAVENOUS | Status: AC
Start: 2017-04-24 — End: 2017-04-24
  Administered 2017-04-24: 510 mg via INTRAVENOUS
  Filled 2017-04-24: qty 17

## 2017-04-24 NOTE — Patient Instructions (Signed)
Physician will call with results of stool test when completed.

## 2017-04-24 NOTE — Progress Notes (Signed)
Presenting complaint;  Follow-up for nausea vomiting dysphagia or abdominal pain and diarrhea.  Database and Subjective:  Patient is 66 year old Caucasian male who is here for scheduled visit. He was last seen 4 months ago with chronic complaints of nausea vomiting abdominal pain as well as worsening diarrhea. He has history of Crohn's disease and has been in remission. He had remote right hemicolectomy. On his last visit he was advised to stop diphenoxylate and was begun on Imodium on schedule. He has been seeing Dr. Anitra Lauth on regular basis. Qualitative fecal fat analysis was abnormal. I recommended fecal elastase but he is not done this test. He was also found to have low vitamin A and E levels and begun on supplements. He was also found to have testosterone deficiency and begun on therapy. He is also receiving parenteral iron infusion.  He has multiple complaints. He complains of chronic epigastric pain as well as low back pain. He is in the process of being referred to pain clinic. She remains with diarrhea. He is having 8-10 stools per day. He has diarrhea both during daytime and at night. He states if he was not taking Imodium he would be incontinent. Since he has been on Imodium he has had very few accidents. More than 50% of his stools are loose to soft. He denies melena or rectal bleeding. His stools have been black since he has been on iron. He says his appetite is up and down. Lately her appetite has been good. He remains with intermittent nausea and vomiting at least once a week. Vomitus usually is bilious. He denies hematemesis. He was given Questran for his diarrhea but he was not able to tolerate this medication. He is presently living with his parents. He has not had any alcohol in 6 months.   Current Medications: Outpatient Encounter Prescriptions as of 04/24/2017  Medication Sig  . ferrous sulfate 325 (65 FE) MG EC tablet Take 325 mg by mouth 3 (three) times daily with meals.  Marland Kitchen  FLUoxetine (PROZAC) 40 MG capsule TAKE ONE CAPSULE BY MOUTH DAILY.  . folic acid (FOLVITE) 1 MG tablet Take 1 tablet by mouth daily.  Marland Kitchen loperamide (IMODIUM A-D) 2 MG tablet Take 1 tablet (2 mg total) by mouth 3 (three) times daily before meals.  Marland Kitchen loratadine (CLARITIN) 10 MG tablet Take 10 mg by mouth daily as needed for allergies.   Marland Kitchen LORazepam (ATIVAN) 1 MG tablet Take 1 mg by mouth 2 (two) times daily.  . Multiple Vitamin (MULTIVITAMIN WITH MINERALS) TABS tablet Take 1 tablet by mouth daily.  Marland Kitchen omeprazole (PRILOSEC) 20 MG capsule Take 1 capsule (20 mg total) by mouth daily.  . ondansetron (ZOFRAN) 4 MG tablet TAKE ONE TABLET BY MOUTH TWICE DAILY AS NEEDED FOR NAUSEA OR VOMITING. DO NOT TAKE DAILY.  Marland Kitchen testosterone cypionate (DEPOTESTOSTERONE CYPIONATE) 200 MG/ML injection Inject 1 mL (200 mg total) into the muscle every 14 (fourteen) days.  . Thiamine Mononitrate (VITAMIN B1) 100 MG TABS Take 1 tablet by mouth daily.  . traZODone (DESYREL) 50 MG tablet 1-2 tabs po qhs for insomnia  . vitamin A 8000 UNIT capsule Take 1 capsule (8,000 Units total) by mouth daily.  . vitamin E 1000 UNIT capsule Take 1 capsule (1,000 Units total) by mouth daily.  Marland Kitchen gabapentin (NEURONTIN) 300 MG capsule 1 capsule in the morning, noon and evening, and 2 capsules at bedtime. (Patient not taking: Reported on 04/24/2017)  . sucralfate (CARAFATE) 1 g tablet Take 2 tablets (2 g  total) by mouth at bedtime. (Patient not taking: Reported on 04/24/2017)   No facility-administered encounter medications on file as of 04/24/2017.      Objective: Blood pressure 106/60, pulse 76, temperature (!) 97 F (36.1 C), temperature source Oral, resp. rate 18, height 6' (1.829 m), weight 157 lb 4.8 oz (71.4 kg). Patient is alert and in no acute distress. Conjunctiva is pink. Sclera is nonicteric Oropharyngeal mucosa is normal. No neck masses or thyromegaly noted. Cardiac exam with regular rhythm normal S1 and S2. No murmur or gallop  noted. Lungs are clear to auscultation. Abdomen. He has midline scar. Bowel sounds are hyperactive. On palpation abdomen is soft with moderate midepigastric tenderness. No organomegaly or masses. No LE edema or clubbing noted.  Labs/studies Results:  Vitamin A level was 7 on 03/22/2017. Vitamin A level was 5.5 on 01/04/2017 Alpha-tocopherol level 7.9 on 03/22/2017 Gamma tocopherol level less than 1.0. On 03/22/2017  H&H 11.3 and 34.21 04/09/2017.  Serum albumin 2.3 on 04/09/2017.    Assessment:  #1. Diarrhea. He has history of Crohn's disease but has been in remission. Diarrhea worsened following cholecystectomy and he has not responded to symptomatic therapy. He has multiple deficiencies i.e. iron vitamin A and E. Qualitative fecal fat analysis suggests malabsorption. Most likely etiology would be pancreatic insufficiency given history of severe episode of pancreatitis few years ago. He could also have bacterial overgrowth given altered upper GI tract secondary to prior surgery. He will continue Imodium OTC while workup is underway to make definite diagnosis.  #2. GERD. He has predominantly bile reflux secondary to gastric surgery. He was referred to Dr. Abran Richard of [facility] 2 years ago. He decided against surgery because of high risk. Given diarrhea he is not a candidate for promotility agent. He has history of esophageal stricture. Will consider dilation if dysphagia gets worse.  #3. Chronic abdominal pain. 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 He could also have chronic pancreatitis with CT of February 2018 did not show pancreatic calcification or other abnormalities to suggest chronic pancreatitis. If pain remains an issue would consider pancreatic EUS.  #4. Iron deficiency anemia. He most likely has malabsorption. Last colonoscopy was in November 2014 and Crohn's disease was felt to be in remission. Last EGD was 6 months ago revealing portal hypertensive gastropathy erosive reflux  esophagitis and benign esophageal stricture. If he has evidence of GI bleed further testing may be necessary. He is responding to parenteral iron.  Plan:  Fecal pancreatic elastase level. He is scheduled to have follow-up vitamin A and E levels by Dr. Anitra Lauth. Patient given paperwork for this test and advised to provide stool sample to the lab as soon as possible. In the meantime he will continue loperamide and omeprazole as before. Office visit in 4 months.

## 2017-05-01 ENCOUNTER — Encounter (HOSPITAL_COMMUNITY)
Admission: RE | Admit: 2017-05-01 | Discharge: 2017-05-01 | Disposition: A | Discharge status: Home / Self Care | Payer: Medicare Other | Source: Ambulatory Visit | Attending: Family Medicine | Admitting: Family Medicine

## 2017-05-01 DIAGNOSIS — D509 Iron deficiency anemia, unspecified: Secondary | ICD-10-CM | POA: Diagnosis not present

## 2017-05-01 DIAGNOSIS — R197 Diarrhea, unspecified: Secondary | ICD-10-CM | POA: Diagnosis not present

## 2017-05-01 MED ORDER — SODIUM CHLORIDE 0.9 % IV SOLN
510.0000 mg | Freq: Once | INTRAVENOUS | Status: AC
  Administered 2017-05-01: 510 mg via INTRAVENOUS
  Filled 2017-05-01: qty 17

## 2017-05-01 MED ORDER — SODIUM CHLORIDE 0.9 % IV SOLN
INTRAVENOUS | Status: DC
  Administered 2017-05-01: 14:00:00 via INTRAVENOUS

## 2017-05-03 ENCOUNTER — Encounter: Payer: Self-pay | Admitting: Family Medicine

## 2017-05-03 ENCOUNTER — Telehealth: Payer: Self-pay

## 2017-05-03 NOTE — Telephone Encounter (Signed)
LM requesting call back regarding AWV. Requesting pt complete AWV with [facility] on 05/15/17 @ 0900 prior to appt with PCP.

## 2017-05-05 ENCOUNTER — Other Ambulatory Visit (INDEPENDENT_AMBULATORY_CARE_PROVIDER_SITE_OTHER): Payer: Self-pay | Admitting: Internal Medicine

## 2017-05-07 ENCOUNTER — Other Ambulatory Visit (INDEPENDENT_AMBULATORY_CARE_PROVIDER_SITE_OTHER): Payer: Self-pay | Admitting: Internal Medicine

## 2017-05-07 MED ORDER — LORAZEPAM 1 MG PO TABS: 1.0000 mg | ORAL_TABLET | Freq: Two times a day (BID) | ORAL | 2 refills | Status: DC

## 2017-05-08 LAB — PANCREATIC ELASTASE, FECAL: Pancreatic Elastase-1, Stool: 267 mcg/g

## 2017-05-15 ENCOUNTER — Ambulatory Visit: Payer: Self-pay | Admitting: Family Medicine

## 2017-05-16 ENCOUNTER — Encounter (INDEPENDENT_AMBULATORY_CARE_PROVIDER_SITE_OTHER): Payer: Self-pay | Admitting: Internal Medicine

## 2017-05-21 ENCOUNTER — Telehealth: Payer: Self-pay | Admitting: *Deleted

## 2017-05-21 ENCOUNTER — Telehealth: Payer: Self-pay | Admitting: Family Medicine

## 2017-05-21 DIAGNOSIS — M542 Cervicalgia: Secondary | ICD-10-CM

## 2017-05-21 DIAGNOSIS — G8929 Other chronic pain: Secondary | ICD-10-CM

## 2017-05-21 DIAGNOSIS — M5442 Lumbago with sciatica, left side: Principal | ICD-10-CM

## 2017-05-21 DIAGNOSIS — M5441 Lumbago with sciatica, right side: Principal | ICD-10-CM

## 2017-05-21 NOTE — Telephone Encounter (Signed)
SW pt he stated that he would like to go to [facility]. He stated that this office is about 15 minutes from his home.

## 2017-05-21 NOTE — Telephone Encounter (Signed)
OK, but he lives in [place], [place]. Is he ok with a pain management clinic in [place] area? If he wants one in [place] area, he needs to find out the name of a clinic that he wants referral to and call back with the information and then I can order referral.  Also, there is Dr. Joneen Caraway in [place] that is an option as well. Let me know-thx

## 2017-05-21 NOTE — Telephone Encounter (Signed)
Pt called requesting referral to pain management. Please advise. Thanks.

## 2017-05-21 NOTE — Telephone Encounter (Signed)
OK.   Referral ordered as per pt request. 

## 2017-05-21 NOTE — Telephone Encounter (Signed)
Patient requesting call back regarding stool sample results.

## 2017-05-21 NOTE — Telephone Encounter (Signed)
Pt aware someone will be contacting him to schedule an apt.

## 2017-05-21 NOTE — Telephone Encounter (Signed)
Pt advised to contact Dr. Laural Golden his GI doctor.

## 2017-05-28 ENCOUNTER — Ambulatory Visit: Payer: Self-pay | Admitting: Family Medicine

## 2017-05-28 DIAGNOSIS — Z0289 Encounter for other administrative examinations: Secondary | ICD-10-CM

## 2017-05-28 NOTE — Progress Notes (Deleted)
OFFICE VISIT  05/28/2017   CC: No chief complaint on file.    HPI:    Patient is a 66 y.o. Caucasian male who presents for recent dx of male hypogonadism.  Started testosterone, level check was high so we decreased his dose.  Past Medical History:  Diagnosis Date  . Alcoholic hepatitis   . 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 Alcoholic pancreatitis   . Alcoholism ([place])    Still drinking as of 09/2016  . Allergy   . Anxiety   . Anxiety and depression   . Arthritis   . Aspiration pneumonitis ([facility])    recurrent  . Bile reflux esophagitis    Chronic; due to gastrojejunostomy.  He was evaluated by Dr. Demetrio Lapping of [facility] last year for Roux-en-Y procedure. He decided not to proceed with surgery because of high risk. Metoclopramide resulted in diarrhea and dominant and domperodone was ineffective.   . Chronic back pain   . Chronic diarrhea   . Crohn disease ([place])    in remission since approx 1995  . DDD (degenerative disc disease), cervical 02/2013   MRI by Dr. Joneen Caraway  . DDD (degenerative disc disease), lumbar    L > R lumbar radiculopathy  . Depression   . Dumping syndrome   . Encephalomalacia on imaging study 02/2013   MRI brain w/o contrast: Mild progression of left occipital lobe encephalomalacia and  . Essential hypertension, benign   . Fatty liver 02/2013; 03/2016; 03/2017   Abd u/s.  Also noted on CT's of abdomen.  No change on 03/2017 u/s except mild ascites noted.  . Gastroparesis   . GERD (gastroesophageal reflux disease)   . Heart murmur 2018   ECHO: normal except calcified aortic annulus and leaflets, w/out any stenosis or compromise of valve function.  Marland Kitchen History of esophageal stricture    Recurrent--at GE junction.  Has required multiple dilation procedures (most recent 10/13/16).  Esophagrams have shown normal swallowing function.  Marland Kitchen History of kidney stones   . History of liver failure    (alchoholic) Recovered in hosp  . Hypogonadism male    Androgel 1.62%, 2  pumps qd started 02/05/17  . Iron deficiency anemia    Malabsorption:  feraheme infusion 04/24/17 [facility]  . Malabsorption syndrome   . Migraine headache   . Paraesophageal hernia   . Sleep apnea    cannot tolerate CPAP  . Stroke [facility])    short term memory loss.  . Subdural hematoma ([place])   . TIA (transient ischemic attack)   . Vitamin deficiency 2018   Vit A and Vit E (secondary to malabsorption and mild malnutrition)    Past Surgical History:  Procedure Laterality Date  . BILROTH II PROCEDURE     with gastrojejunostomy.  *Loss of duodenal continuity--malabsorption, esp of fat soluble vitamins and iron, may occur.  Marland Kitchen CARDIOVASCULAR STRESS TEST  01/30/14   Myocardial perfusion imaging: NORMAL ([name]).  . CHOLECYSTECTOMY  05/2016   Open.    . COLON RESECTION Right    right colon resection and ileocolonic anastamosis  . COLONOSCOPY N/A 07/02/2013   Procedure: COLONOSCOPY;  Surgeon: Rogene Houston, MD;  [facility];  Service: Endoscopy;  Laterality: N/A;  200  . ESOPHAGEAL DILATION N/A 10/30/2014   Procedure: ESOPHAGEAL DILATION;  Surgeon: Rogene Houston, MD;  [facility];  Service: Endoscopy;  Laterality: N/A;  . ESOPHAGEAL DILATION N/A 02/12/2015   Procedure: ESOPHAGEAL DILATION;  Surgeon: Rogene Houston, MD;  [facility];  Service: Endoscopy;  Laterality: N/A;  . ESOPHAGEAL DILATION N/A 01/21/2016   Procedure: ESOPHAGEAL DILATION;  Surgeon: Rogene Houston, MD;  [facility];  Service: Endoscopy;  Laterality: N/A;  . ESOPHAGEAL DILATION N/A 10/13/2016   H pylori neg.  Esophagitis, esoph stricture, and portal hypertensive gastropathy noted.  Procedure: ESOPHAGEAL DILATION;  Surgeon: Rogene Houston, MD;  [facility];  Service: Endoscopy;  Laterality: N/A;  . ESOPHAGOGASTRODUODENOSCOPY N/A 08/14/2013   Procedure: ESOPHAGOGASTRODUODENOSCOPY (EGD);  Surgeon: Rogene Houston, MD;  [facility];  Service: Endoscopy;   Laterality: N/A;  300  . ESOPHAGOGASTRODUODENOSCOPY N/A 10/30/2014   Procedure: ESOPHAGOGASTRODUODENOSCOPY (EGD);  Surgeon: Rogene Houston, MD;  [facility];  Service: Endoscopy;  Laterality: N/A;  940  . ESOPHAGOGASTRODUODENOSCOPY N/A 02/12/2015   Procedure: ESOPHAGOGASTRODUODENOSCOPY (EGD);  Surgeon: Rogene Houston, MD;  [facility];  Service: Endoscopy;  Laterality: N/A;  105  . ESOPHAGOGASTRODUODENOSCOPY  01/21/16   gastritis/reactive gastropathy.  H pylori neg.  Bx benign.  . ESOPHAGOGASTRODUODENOSCOPY N/A 01/21/2016   Procedure: ESOPHAGOGASTRODUODENOSCOPY (EGD);  Surgeon: Rogene Houston, MD;  [facility];  Service: Endoscopy;  Laterality: N/A;  1:10 - moved to 5/26 @ 8:25  . ESOPHAGOGASTRODUODENOSCOPY  10/13/2016   Benign gastric mucosa.  Chronic inactive gastritis.  H pylori NEG (Dr. Laural Golden)  . ESOPHAGOGASTRODUODENOSCOPY (EGD) WITH PROPOFOL N/A 10/13/2016   Procedure: ESOPHAGOGASTRODUODENOSCOPY (EGD) WITH PROPOFOL;  Surgeon: Rogene Houston, MD;  [facility];  Service: Endoscopy;  Laterality: N/A;  9:15  . HIATAL HERNIA REPAIR  09/2007   He underwent repair of large hiatal hernia and pyloroplasty which was complicated by pyloroplasty leak and subsequent gastrojejunostomy  . NISSEN FUNDOPLICATION     x2  . NOCTURNAL POLYSOMNOGRAM  05/2013   Severe periodic limb movement d/o: trial of dopamine agonist such as requip was recommended.  Marland Kitchen PYLOROPLASTY  2009  . TRANSTHORACIC ECHOCARDIOGRAM  12/2013; 12/2016   2015: EF 65-70%, mild LVH, grade I DD, no valvular problems.  2018: EF 55-60%, no wall motion abnormalities, valves normal except aortic leaflets and annulus calcifications w/out any stenosis or regurg.    Outpatient Medications Prior to Visit  Medication Sig Dispense Refill  . ferrous sulfate 325 (65 FE) MG EC tablet Take 325 mg by mouth 3 (three) times daily with meals.    Marland Kitchen FLUoxetine (PROZAC) 40 MG capsule TAKE ONE CAPSULE BY MOUTH DAILY.  30 capsule 1  . folic acid (FOLVITE) 1 MG tablet Take 1 tablet by mouth daily.    Marland Kitchen gabapentin (NEURONTIN) 300 MG capsule 1 capsule in the morning, noon and evening, and 2 capsules at bedtime. (Patient not taking: Reported on 04/24/2017) 120 capsule 3  . loperamide (IMODIUM A-D) 2 MG tablet Take 1 tablet (2 mg total) by mouth 3 (three) times daily before meals. 30 tablet 0  . loratadine (CLARITIN) 10 MG tablet Take 10 mg by mouth daily as needed for allergies.     Marland Kitchen LORazepam (ATIVAN) 1 MG tablet Take 1 tablet (1 mg total) by mouth 2 (two) times daily. 60 tablet 2  . Multiple Vitamin (MULTIVITAMIN WITH MINERALS) TABS tablet Take 1 tablet by mouth daily.    Marland Kitchen omeprazole (PRILOSEC) 20 MG capsule Take 1 capsule (20 mg total) by mouth daily. 30 capsule 5  . ondansetron (ZOFRAN) 4 MG tablet TAKE ONE TABLET BY MOUTH TWICE DAILY AS NEEDED FOR NAUSEA OR VOMITING. DO NOT TAKE  DAILY. 30 tablet 0  . sucralfate (CARAFATE) 1 g tablet Take 2 tablets (2 g total) by mouth at bedtime. (Patient not taking: Reported on 04/24/2017) 60 tablet 3  . testosterone cypionate (DEPOTESTOSTERONE CYPIONATE) 200 MG/ML injection Inject 1 mL (200 mg total) into the muscle every 14 (fourteen) days. 10 mL 1  . Thiamine Mononitrate (VITAMIN B1) 100 MG TABS Take 1 tablet by mouth daily.    . traZODone (DESYREL) 50 MG tablet 1-2 tabs po qhs for insomnia 60 tablet 6  . vitamin A 8000 UNIT capsule Take 1 capsule (8,000 Units total) by mouth daily. 90 capsule 0  . vitamin E 1000 UNIT capsule Take 1 capsule (1,000 Units total) by mouth daily. 90 capsule 0   No facility-administered medications prior to visit.     Allergies  Allergen Reactions  . Meloxicam     Other reaction(s): Unknown Per daughter   . Tramadol     Other reaction(s): Unknown  . Acetaminophen Other (See Comments)    Liver issues  . Norvasc [Amlodipine] Other (See Comments)    "felt bad"    ROS As per HPI  PE: There were no vitals taken for this  visit. ***  LABS:  Repeat vit A 02/2017 = low normal. Repeat vit E 02/2017= low (no change from prior result 2 and 1/2 mo prior).  Lab Results  Component Value Date   TSH 2.56 03/20/2016   Lab Results  Component Value Date   WBC 7.9 04/09/2017   HGB 11.3 (L) 04/09/2017   HCT 34.2 (L) 04/09/2017   MCV 92.1 04/09/2017   PLT 170.0 04/09/2017   Lab Results  Component Value Date   CREATININE 1.29 04/09/2017   BUN 7 04/09/2017   NA 137 04/09/2017   K 3.9 04/09/2017   CL 108 04/09/2017   CO2 25 04/09/2017   Lab Results  Component Value Date   ALT 17 04/09/2017   AST 20 04/09/2017   ALKPHOS 114 04/09/2017   BILITOT 1.1 04/09/2017   Lab Results  Component Value Date   CHOL 165 01/24/2014   Lab Results  Component Value Date   HDL 48 01/24/2014   Lab Results  Component Value Date   LDLCALC 90 01/24/2014   Lab Results  Component Value Date   TRIG 135 01/24/2014   Lab Results  Component Value Date   CHOLHDL 3.4 01/24/2014   Lab Results  Component Value Date   PSA 1.43 03/20/2016    Lab Results  Component Value Date   TESTOSTERONE 1,114 (H) 03/22/2017   [place] abd complete 04/11/17: IMPRESSION: Status post cholecystectomy. Increased echogenicity of hepatic parenchyma is noted suggesting diffuse hepatocellular disease or fatty infiltration of the liver. Mild ascites. (No focal hepatic lesion and no changes to suggest cirrhosis were noted by radiologist).  IMPRESSION AND PLAN:    No problem-specific Assessment & Plan notes found for this encounter.   FOLLOW UP: No Follow-up on file.

## 2017-05-30 ENCOUNTER — Other Ambulatory Visit: Payer: Self-pay | Admitting: Family Medicine

## 2017-06-01 NOTE — Telephone Encounter (Signed)
Referral denied by [facility]. They do not accept patient's insurance. LM for patient to CB.

## 2017-07-04 ENCOUNTER — Other Ambulatory Visit: Payer: Self-pay | Admitting: Family Medicine

## 2017-07-04 NOTE — Telephone Encounter (Signed)
[facility]  RF request for gabapentin LOV: 04/09/17 Next ov: None Last written: 11/29/16 #120 w/ 3RF  Please advise. Thanks.

## 2017-07-10 ENCOUNTER — Other Ambulatory Visit: Payer: Self-pay | Admitting: Family Medicine

## 2017-07-10 NOTE — Telephone Encounter (Signed)
Your Pharmacy  RF request for testosterone LOV: 04/09/17 Next ov: None Last written: 02/13/17 #33m w/ 1RF  Please advise. Thanks.

## 2017-07-11 ENCOUNTER — Telehealth: Payer: Self-pay | Admitting: *Deleted

## 2017-07-11 MED ORDER — DIPHENOXYLATE-ATROPINE 2.5-0.025 MG PO TABS: 1.0000 | ORAL_TABLET | Freq: Four times a day (QID) | ORAL | 3 refills | Status: DC | PRN

## 2017-07-11 NOTE — Telephone Encounter (Signed)
OK, lomotil rx printed.

## 2017-07-11 NOTE — Telephone Encounter (Signed)
Left message for pt to call back  °

## 2017-07-11 NOTE — Telephone Encounter (Signed)
Received fax from pts pharmacy requesting refill for diphenoxylate (generic Lomotil). This medication is not on pts med list and was d/c by pt because it was ineffective.   Left message for pt to call back to see if this was requested by him.

## 2017-07-11 NOTE — Telephone Encounter (Signed)
SW pt and he stated that he only has about a half ml left. I asked pt how much testosterone was he taking and he said half ml every 2 weeks.   Your calculations are correct.

## 2017-07-11 NOTE — Telephone Encounter (Signed)
Pls double check my calculations, but according to my last rx for this and the dose change I recommended after his elevated testost level 03/22/17, he should have about 5 ml of this med still.

## 2017-07-11 NOTE — Telephone Encounter (Signed)
Pls notify pt that according to my calculations, he should have about 4-5 ml of his most recent testosterone rx still. I can't RF this med early---it is a controlled substance.  -thx

## 2017-07-11 NOTE — Telephone Encounter (Signed)
SW pt and he stated that he did start this medication back.

## 2017-07-12 NOTE — Telephone Encounter (Signed)
Rx faxed

## 2017-07-12 NOTE — Telephone Encounter (Signed)
Pt advised and voiced understanding.   

## 2017-08-13 ENCOUNTER — Other Ambulatory Visit (INDEPENDENT_AMBULATORY_CARE_PROVIDER_SITE_OTHER): Payer: Self-pay | Admitting: Internal Medicine

## 2017-08-14 ENCOUNTER — Encounter (INDEPENDENT_AMBULATORY_CARE_PROVIDER_SITE_OTHER): Payer: Self-pay | Admitting: Internal Medicine

## 2017-08-14 ENCOUNTER — Ambulatory Visit (INDEPENDENT_AMBULATORY_CARE_PROVIDER_SITE_OTHER): Payer: Medicare Other | Admitting: Internal Medicine

## 2017-08-14 VITALS — BP 130/78 | HR 68 | Temp 98.1°F | Resp 18 | Ht 72.0 in | Wt 160.6 lb

## 2017-08-14 DIAGNOSIS — K860 Alcohol-induced chronic pancreatitis: Secondary | ICD-10-CM | POA: Diagnosis not present

## 2017-08-14 DIAGNOSIS — R197 Diarrhea, unspecified: Secondary | ICD-10-CM | POA: Diagnosis not present

## 2017-08-14 DIAGNOSIS — F411 Generalized anxiety disorder: Secondary | ICD-10-CM | POA: Diagnosis not present

## 2017-08-14 DIAGNOSIS — R1114 Bilious vomiting: Secondary | ICD-10-CM | POA: Diagnosis not present

## 2017-08-14 DIAGNOSIS — Z8719 Personal history of other diseases of the digestive system: Secondary | ICD-10-CM | POA: Diagnosis not present

## 2017-08-14 DIAGNOSIS — E46 Unspecified protein-calorie malnutrition: Secondary | ICD-10-CM | POA: Diagnosis not present

## 2017-08-14 DIAGNOSIS — D649 Anemia, unspecified: Secondary | ICD-10-CM | POA: Diagnosis not present

## 2017-08-14 DIAGNOSIS — K909 Intestinal malabsorption, unspecified: Secondary | ICD-10-CM | POA: Diagnosis not present

## 2017-08-14 DIAGNOSIS — K219 Gastro-esophageal reflux disease without esophagitis: Secondary | ICD-10-CM

## 2017-08-14 DIAGNOSIS — K5090 Crohn's disease, unspecified, without complications: Secondary | ICD-10-CM | POA: Diagnosis not present

## 2017-08-14 MED ORDER — FERROUS SULFATE 325 (65 FE) MG PO TBEC: 325.0000 mg | DELAYED_RELEASE_TABLET | Freq: Every day | ORAL | Status: DC

## 2017-08-14 NOTE — Progress Notes (Signed)
Presenting complaint;  Follow-up for diarrhea GERD anxiety and anemia.  Database and subjective:  Gary Vasquez is 66 year old Caucasian male who has multiple medical problems.  He has a history of Crohn's disease but he has remained in remission for many years.  He also has a history of alcoholic pancreatitis resulting in significant mobility and prolonged hospitalization.  He also has a history of ethanol abuse but finally has been able to quit drinking.  He has GERD complicated by esophageal dysphagia as well as chronic diarrhea with deficiency of vitamin in E.  Qualitative fecal fat testing was abnormal.  Fecal elastase was unremarkable.  He has not yet been treated with pancreatic enzyme supplement.  He is here for scheduled visit.  He states finally he is beginning to feel better.  Diarrhea has improved.  For the last 1 month he has been having 4 stools per day and all of his stools are formed.  He has not had any accidents.  He is taking Imodium 3-4 times a day.  He states he eats more at night and then during the daytime.  He is not having heartburn like he was before.  Similarly he has not had any difficulty swallowing.  He remains with bloating but no nausea or vomiting.  He is not living with his parents.  Both his parents are elderly and he helps them and they today chores.  His father is 46 and mother is 64.  He stays also help in care of his brother who is 13 and he is blind.  He remains with back pain.  He has an appointment at the pain clinic tomorrow.  He also needs refill on his Lorazepam.  He was supposed to have some blood work but unable to do so.  He is doing yard work in addition to cooking and cleaning at home. He has gained 3 pounds since his last visit.   Current Medications: Outpatient Encounter Medications as of 08/14/2017  Medication Sig  . diphenoxylate-atropine (LOMOTIL) 2.5-0.025 MG tablet Take 1 tablet 4 (four) times daily as needed by mouth for diarrhea or loose stools.  Marland Kitchen  FLUoxetine (PROZAC) 40 MG capsule TAKE 1 CAPSULE (40 MG TOTAL) BY MOUTH DAILY.  . folic acid (FOLVITE) 1 MG tablet Take 1 tablet by mouth daily.  Marland Kitchen gabapentin (NEURONTIN) 300 MG capsule 1 cap po morning, noon, and evening, and 2 caps po qhs  . loperamide (IMODIUM A-D) 2 MG tablet Take 1 tablet (2 mg total) by mouth 3 (three) times daily before meals.  Marland Kitchen loratadine (CLARITIN) 10 MG tablet Take 10 mg by mouth daily as needed for allergies.   Marland Kitchen LORazepam (ATIVAN) 1 MG tablet Take 1 tablet (1 mg total) by mouth 2 (two) times daily.  . Multiple Vitamin (MULTIVITAMIN WITH MINERALS) TABS tablet Take 1 tablet by mouth daily.  Marland Kitchen omeprazole (PRILOSEC) 20 MG capsule Take 1 capsule (20 mg total) by mouth daily.  . ondansetron (ZOFRAN) 4 MG tablet TAKE ONE TABLET BY MOUTH TWICE DAILY AS NEEDED FOR NAUSEA OR VOMITING. DO NOT TAKE DAILY.  Marland Kitchen testosterone cypionate (DEPOTESTOSTERONE CYPIONATE) 200 MG/ML injection Inject 1 mL (200 mg total) into the muscle every 14 (fourteen) days.  . Thiamine Mononitrate (VITAMIN B1) 100 MG TABS Take 1 tablet by mouth daily.  . traZODone (DESYREL) 50 MG tablet 1-2 tabs po qhs for insomnia  . vitamin A 8000 UNIT capsule Take 1 capsule (8,000 Units total) by mouth daily.  . vitamin E 1000 UNIT capsule Take  1 capsule (1,000 Units total) by mouth daily.  . ferrous sulfate 325 (65 FE) MG EC tablet Take 325 mg by mouth 3 (three) times daily with meals.  . sucralfate (CARAFATE) 1 g tablet Take 2 tablets (2 g total) by mouth at bedtime. (Patient not taking: Reported on 04/24/2017)   No facility-administered encounter medications on file as of 08/14/2017.      Objective: Blood pressure 130/78, pulse 68, temperature 98.1 F (36.7 C), temperature source Oral, resp. rate 18, height 6' (1.829 m), weight 160 lb 9.6 oz (72.8 kg). Patient is alert and in no acute distress. Conjunctiva is pink. Sclera is nonicteric Oropharyngeal mucosa is normal. No neck masses or thyromegaly  noted. Cardiac exam with regular rhythm normal S1 and S2. No murmur or gallop noted. Lungs are clear to auscultation. Abdomen is full in epigastric region where percussion note is tympanitic.  He has midline scarring.  Bowel sounds are normal.  On palpation abdomen is soft and nontender without organomegaly or masses. No LE edema or clubbing noted.  Labs/studies Results: Lab data from 05/01/2017 Pancreatic fecal elastase to 67 mcg/ gram.  Lab data from 04/09/2017  WBC 7.9, H&H 11.3 and 34.2 and platelet count 170 okay.  Glucose 81.  BUN 7 and creatinine 1.29. Bilirubin 1.1, AP 114, AST 20, ALT 17, total protein 6.0 and albumin 2.3.  Serum calcium 7.9.   Assessment:  #1.  Malabsorptive syndrome.  Qualitative fecal fat testing positive but pancreatic fecal elastase negative.  Pancreatic insufficiency suspected but he has responded to loperamide.  Therefore will hold off further testing unless serum albumin remains low and vitamin A and E levels are also low.  #2.  GERD complicated by esophageal stricture.  Heartburn is well controlled with therapy and he is not having any dysphagia.  I believe turnaround is due to the fact that he is not drinking anymore.  #3.  Anemia.  Suspect anemia of chronic disease.  No evidence of GI bleed.  #4.  Chronic anxiety state.  He is doing well with lorazepam.   Plan:  New prescription for lorazepam given.  Dose is 1 mg p.o. twice daily 1 month supply with 2 refills. Patient will go to the lab for CBC, serum albumin vitamin A and E. Office visit in 4 months.

## 2017-08-14 NOTE — Patient Instructions (Signed)
Physician will call with results of blood test when completed.

## 2017-08-15 DIAGNOSIS — Z8673 Personal history of transient ischemic attack (TIA), and cerebral infarction without residual deficits: Secondary | ICD-10-CM | POA: Diagnosis not present

## 2017-08-15 DIAGNOSIS — F17210 Nicotine dependence, cigarettes, uncomplicated: Secondary | ICD-10-CM | POA: Diagnosis not present

## 2017-08-15 DIAGNOSIS — K5090 Crohn's disease, unspecified, without complications: Secondary | ICD-10-CM | POA: Diagnosis not present

## 2017-08-15 DIAGNOSIS — M545 Low back pain: Secondary | ICD-10-CM | POA: Diagnosis not present

## 2017-08-15 DIAGNOSIS — F1011 Alcohol abuse, in remission: Secondary | ICD-10-CM | POA: Diagnosis not present

## 2017-08-15 DIAGNOSIS — K7460 Unspecified cirrhosis of liver: Secondary | ICD-10-CM | POA: Diagnosis not present

## 2017-08-15 DIAGNOSIS — G8929 Other chronic pain: Secondary | ICD-10-CM | POA: Diagnosis not present

## 2017-08-19 ENCOUNTER — Encounter: Payer: Self-pay | Admitting: Family Medicine

## 2017-08-19 LAB — CBC
HEMATOCRIT: 41 % (ref 38.5–50.0)
HEMOGLOBIN: 14.1 g/dL (ref 13.2–17.1)
MCH: 31.6 pg (ref 27.0–33.0)
MCHC: 34.4 g/dL (ref 32.0–36.0)
MCV: 91.9 fL (ref 80.0–100.0)
MPV: 13.1 fL — AB (ref 7.5–12.5)
Platelets: 134 10*3/uL — ABNORMAL LOW (ref 140–400)
RBC: 4.46 10*6/uL (ref 4.20–5.80)
RDW: 13.8 % (ref 11.0–15.0)
WBC: 5.3 10*3/uL (ref 3.8–10.8)

## 2017-08-19 LAB — VITAMIN E
GAMMA-TOCOPHEROL (VIT E): 2.4 mg/L (ref <=4.3)
Vitamin E (Alpha Tocopherol): 16.9 mg/L (ref 5.7–19.9)

## 2017-08-19 LAB — VITAMIN A: Vitamin A (Retinoic Acid): 33 ug/dL — ABNORMAL LOW (ref 38–98)

## 2017-08-19 LAB — ALBUMIN: Albumin: 3.5 g/dL — ABNORMAL LOW (ref 3.6–5.1)

## 2017-08-26 ENCOUNTER — Encounter: Payer: Self-pay | Admitting: Family Medicine

## 2017-08-27 DIAGNOSIS — M545 Low back pain: Secondary | ICD-10-CM | POA: Diagnosis not present

## 2017-08-27 DIAGNOSIS — M6281 Muscle weakness (generalized): Secondary | ICD-10-CM | POA: Diagnosis not present

## 2017-08-27 DIAGNOSIS — Z79899 Other long term (current) drug therapy: Secondary | ICD-10-CM | POA: Diagnosis not present

## 2017-08-27 DIAGNOSIS — R293 Abnormal posture: Secondary | ICD-10-CM | POA: Diagnosis not present

## 2017-08-27 DIAGNOSIS — G8929 Other chronic pain: Secondary | ICD-10-CM | POA: Diagnosis not present

## 2017-08-28 DIAGNOSIS — K3189 Other diseases of stomach and duodenum: Secondary | ICD-10-CM

## 2017-08-28 DIAGNOSIS — K766 Portal hypertension: Secondary | ICD-10-CM

## 2017-08-28 HISTORY — DX: Other diseases of stomach and duodenum: K31.89

## 2017-08-28 HISTORY — DX: Portal hypertension: K76.6

## 2017-08-30 DIAGNOSIS — R29898 Other symptoms and signs involving the musculoskeletal system: Secondary | ICD-10-CM | POA: Diagnosis not present

## 2017-08-30 DIAGNOSIS — M545 Low back pain: Secondary | ICD-10-CM | POA: Diagnosis not present

## 2017-08-30 DIAGNOSIS — R293 Abnormal posture: Secondary | ICD-10-CM | POA: Diagnosis not present

## 2017-08-30 DIAGNOSIS — G8929 Other chronic pain: Secondary | ICD-10-CM | POA: Diagnosis not present

## 2017-09-04 DIAGNOSIS — R293 Abnormal posture: Secondary | ICD-10-CM | POA: Diagnosis not present

## 2017-09-04 DIAGNOSIS — G8929 Other chronic pain: Secondary | ICD-10-CM | POA: Diagnosis not present

## 2017-09-04 DIAGNOSIS — R29898 Other symptoms and signs involving the musculoskeletal system: Secondary | ICD-10-CM | POA: Diagnosis not present

## 2017-09-04 DIAGNOSIS — M545 Low back pain: Secondary | ICD-10-CM | POA: Diagnosis not present

## 2017-09-06 DIAGNOSIS — R293 Abnormal posture: Secondary | ICD-10-CM | POA: Diagnosis not present

## 2017-09-06 DIAGNOSIS — G8929 Other chronic pain: Secondary | ICD-10-CM | POA: Diagnosis not present

## 2017-09-06 DIAGNOSIS — R29898 Other symptoms and signs involving the musculoskeletal system: Secondary | ICD-10-CM | POA: Diagnosis not present

## 2017-09-06 DIAGNOSIS — M545 Low back pain: Secondary | ICD-10-CM | POA: Diagnosis not present

## 2017-09-10 DIAGNOSIS — R293 Abnormal posture: Secondary | ICD-10-CM | POA: Diagnosis not present

## 2017-09-10 DIAGNOSIS — M545 Low back pain: Secondary | ICD-10-CM | POA: Diagnosis not present

## 2017-09-10 DIAGNOSIS — R29898 Other symptoms and signs involving the musculoskeletal system: Secondary | ICD-10-CM | POA: Diagnosis not present

## 2017-09-10 DIAGNOSIS — G8929 Other chronic pain: Secondary | ICD-10-CM | POA: Diagnosis not present

## 2017-09-13 DIAGNOSIS — R293 Abnormal posture: Secondary | ICD-10-CM | POA: Diagnosis not present

## 2017-09-13 DIAGNOSIS — M545 Low back pain: Secondary | ICD-10-CM | POA: Diagnosis not present

## 2017-09-13 DIAGNOSIS — R29898 Other symptoms and signs involving the musculoskeletal system: Secondary | ICD-10-CM | POA: Diagnosis not present

## 2017-09-13 DIAGNOSIS — G8929 Other chronic pain: Secondary | ICD-10-CM | POA: Diagnosis not present

## 2017-09-18 DIAGNOSIS — G8929 Other chronic pain: Secondary | ICD-10-CM | POA: Diagnosis not present

## 2017-09-18 DIAGNOSIS — R29898 Other symptoms and signs involving the musculoskeletal system: Secondary | ICD-10-CM | POA: Diagnosis not present

## 2017-09-18 DIAGNOSIS — M545 Low back pain: Secondary | ICD-10-CM | POA: Diagnosis not present

## 2017-09-18 DIAGNOSIS — R293 Abnormal posture: Secondary | ICD-10-CM | POA: Diagnosis not present

## 2017-09-20 DIAGNOSIS — R29898 Other symptoms and signs involving the musculoskeletal system: Secondary | ICD-10-CM | POA: Diagnosis not present

## 2017-09-20 DIAGNOSIS — G8929 Other chronic pain: Secondary | ICD-10-CM | POA: Diagnosis not present

## 2017-09-20 DIAGNOSIS — M545 Low back pain: Secondary | ICD-10-CM | POA: Diagnosis not present

## 2017-09-20 DIAGNOSIS — R293 Abnormal posture: Secondary | ICD-10-CM | POA: Diagnosis not present

## 2017-09-24 DIAGNOSIS — M545 Low back pain: Secondary | ICD-10-CM | POA: Diagnosis not present

## 2017-09-24 DIAGNOSIS — R29898 Other symptoms and signs involving the musculoskeletal system: Secondary | ICD-10-CM | POA: Diagnosis not present

## 2017-09-24 DIAGNOSIS — R293 Abnormal posture: Secondary | ICD-10-CM | POA: Diagnosis not present

## 2017-09-24 DIAGNOSIS — G8929 Other chronic pain: Secondary | ICD-10-CM | POA: Diagnosis not present

## 2017-09-28 DIAGNOSIS — R079 Chest pain, unspecified: Secondary | ICD-10-CM

## 2017-09-28 HISTORY — DX: Chest pain, unspecified: R07.9

## 2017-10-04 DIAGNOSIS — R29898 Other symptoms and signs involving the musculoskeletal system: Secondary | ICD-10-CM | POA: Diagnosis not present

## 2017-10-04 DIAGNOSIS — M545 Low back pain: Secondary | ICD-10-CM | POA: Diagnosis not present

## 2017-10-04 DIAGNOSIS — R293 Abnormal posture: Secondary | ICD-10-CM | POA: Diagnosis not present

## 2017-10-04 DIAGNOSIS — G8929 Other chronic pain: Secondary | ICD-10-CM | POA: Diagnosis not present

## 2017-10-11 DIAGNOSIS — R293 Abnormal posture: Secondary | ICD-10-CM | POA: Diagnosis not present

## 2017-10-11 DIAGNOSIS — R29898 Other symptoms and signs involving the musculoskeletal system: Secondary | ICD-10-CM | POA: Diagnosis not present

## 2017-10-11 DIAGNOSIS — G8929 Other chronic pain: Secondary | ICD-10-CM | POA: Diagnosis not present

## 2017-10-11 DIAGNOSIS — M545 Low back pain: Secondary | ICD-10-CM | POA: Diagnosis not present

## 2017-10-15 ENCOUNTER — Other Ambulatory Visit (INDEPENDENT_AMBULATORY_CARE_PROVIDER_SITE_OTHER): Payer: Self-pay | Admitting: Internal Medicine

## 2017-10-17 ENCOUNTER — Other Ambulatory Visit: Payer: Self-pay | Admitting: Family Medicine

## 2017-10-17 NOTE — Telephone Encounter (Addendum)
[facility]  RF request for testosterone LOV: 04/09/17 Next ov: None Last written: 02/13/17 #31ml 1Rf   Please advise. Thanks.

## 2017-10-18 ENCOUNTER — Encounter: Payer: Self-pay | Admitting: Family Medicine

## 2017-10-18 ENCOUNTER — Ambulatory Visit (INDEPENDENT_AMBULATORY_CARE_PROVIDER_SITE_OTHER): Payer: Medicare Other | Admitting: Family Medicine

## 2017-10-18 VITALS — BP 149/73 | HR 79 | Temp 97.9°F | Resp 16 | Ht 70.0 in | Wt 164.2 lb

## 2017-10-18 DIAGNOSIS — R0789 Other chest pain: Secondary | ICD-10-CM

## 2017-10-18 DIAGNOSIS — K909 Intestinal malabsorption, unspecified: Secondary | ICD-10-CM

## 2017-10-18 DIAGNOSIS — K709 Alcoholic liver disease, unspecified: Secondary | ICD-10-CM | POA: Diagnosis not present

## 2017-10-18 DIAGNOSIS — E611 Iron deficiency: Secondary | ICD-10-CM | POA: Diagnosis not present

## 2017-10-18 DIAGNOSIS — Z23 Encounter for immunization: Secondary | ICD-10-CM | POA: Diagnosis not present

## 2017-10-18 DIAGNOSIS — F1020 Alcohol dependence, uncomplicated: Secondary | ICD-10-CM

## 2017-10-18 DIAGNOSIS — E291 Testicular hypofunction: Secondary | ICD-10-CM

## 2017-10-18 LAB — COMPREHENSIVE METABOLIC PANEL
ALK PHOS: 121 U/L — AB (ref 39–117)
ALT: 24 U/L (ref 0–53)
AST: 41 U/L — AB (ref 0–37)
Albumin: 4 g/dL (ref 3.5–5.2)
BILIRUBIN TOTAL: 2 mg/dL — AB (ref 0.2–1.2)
BUN: 9 mg/dL (ref 6–23)
CALCIUM: 9.6 mg/dL (ref 8.4–10.5)
CO2: 25 mEq/L (ref 19–32)
CREATININE: 1.14 mg/dL (ref 0.40–1.50)
Chloride: 101 mEq/L (ref 96–112)
GFR: 68.68 mL/min (ref >60.00)
GLUCOSE: 109 mg/dL — AB (ref 70–99)
Potassium: 4 mEq/L (ref 3.5–5.1)
Sodium: 136 mEq/L (ref 135–145)
TOTAL PROTEIN: 8.1 g/dL (ref 6.0–8.3)

## 2017-10-18 LAB — TESTOSTERONE: Testosterone: 258.22 ng/dL — ABNORMAL LOW (ref 300.00–890.00)

## 2017-10-18 LAB — IRON: Iron: 305 ug/dL — ABNORMAL HIGH (ref 42–165)

## 2017-10-18 LAB — FERRITIN: FERRITIN: 196.6 ng/mL (ref 22.0–322.0)

## 2017-10-18 LAB — PSA: PSA: 2.14 ng/mL (ref 0.10–4.00)

## 2017-10-18 LAB — PROTIME-INR
INR: 1.2 ratio — AB (ref 0.8–1.0)
Prothrombin Time: 13.3 s — ABNORMAL HIGH (ref 9.6–13.1)

## 2017-10-18 NOTE — Progress Notes (Signed)
OFFICE VISIT  10/18/2017   CC:  Chief Complaint  Patient presents with  . Follow-up    RCI, pt is not fasting.    HPI:    Patient is a 67 y.o. Caucasian male who presents for 6 mo f/u alcoholism, alcoholic liver dz, male hypogonadism. 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 At most recent GI MD f/u this had improved, and his iron and Vit E and A were improving. Suffers from chronic, severe, treatment refractory GERD with recurrent esoph stenosis. 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  As usual, patient is a very vague historian and no matter how he is redirected he seems to never give a satisfactory/clear answer to questioning:   Last testost injection was "2-3 weeks ago".  Taking 1/2 ml q2 wks up until that point. He noted a bit more energy and sex drive on the 1 ml dose q2 wks, but his testosterone level was too high on this dose. He says he has tried the topical treatment but found it more of a nuisance/displeasure to rub this on his body every day.  Doing PT, says on occasion he gets very tired, SOB easily with light exercise.  BP low on 2 occasions (100/50).  Most of the time bp is normal or high normal, though.  HR 60s-70s.  Had some dizziness with the low bp.  Occ CP L sided and L arm pain with ambulation but not at rest.  Lasts 30 min or so, ? Better with rest or just subsides naturally. He does not have any nitroglycerin.   Notes occ LE swelling--comes and goes.  None currently. He is still smoking cigarettes.  Went to ED in [place], [place] about 6 mo ago for CP--he says all was ok after 1 NTG---was told to f/u with me but he did not do so. Back in 2015 he had an ischemia evaluation and myocardial perfusion imaging was NORMAL and echo normal except grd I DD and mild LVH.  Repeat echo 2018 fine--see PSH section for details.   Past Medical History:   Diagnosis Date  . Alcoholic hepatitis   . Alcoholic liver disease ([place])   . Alcoholic pancreatitis   . Alcoholism ([place])    Still drinking as of 09/2016  . Allergy   . Anxiety   . Anxiety and depression   . Arthritis   . Aspiration pneumonitis ([facility])    recurrent  . Bile reflux esophagitis    Chronic; due to gastrojejunostomy.  He was evaluated by Dr. Demetrio Lapping of [facility] last year for Roux-en-Y procedure. He decided not to proceed with surgery because of high risk. Metoclopramide resulted in diarrhea and dominant and domperodone was ineffective.   . Chronic back pain   . Chronic diarrhea   . Crohn disease ([place])    in remission since approx 1995  . DDD (degenerative disc disease), cervical 02/2013   MRI by Dr. Joneen Caraway  . DDD (degenerative disc disease), lumbar    L > R lumbar radiculopathy  . Depression   . Dumping syndrome   . Encephalomalacia on imaging study 02/2013   MRI brain w/o contrast: Mild progression of left occipital lobe encephalomalacia and  . Essential hypertension, benign   . Fatty liver 02/2013; 03/2016; 03/2017   Abd u/s.  Also noted on CT's of abdomen.  No change on 03/2017 u/s except mild ascites noted.  . Gastroparesis   .  GERD (gastroesophageal reflux disease)   . Heart murmur 2018   ECHO: normal except calcified aortic annulus and leaflets, w/out any stenosis or compromise of valve function.  Marland Kitchen History of esophageal stricture    Recurrent--at GE junction.  Has required multiple dilation procedures (most recent 10/13/16).  Esophagrams have shown normal swallowing function.  Marland Kitchen History of kidney stones   . History of liver failure    (alchoholic) Recovered in hosp  . Hypogonadism male    Androgel 1.62%, 2 pumps qd started 02/05/17  . Iron deficiency anemia    Malabsorption:  feraheme infusion 04/24/17 [facility]  . Malabsorption syndrome    Question pancreatic exocrine insufficiency, but abnorption improved signif after pt stopped alcohol for 6-9 mo (iron/hb, vitamin  levels improved)  . Migraine headache   . Paraesophageal hernia   . Sleep apnea    cannot tolerate CPAP  . Stroke [facility])    short term memory loss.  . Subdural hematoma ([place])   . TIA (transient ischemic attack)   . Vitamin deficiency 2018   Vit A and Vit E (secondary to malabsorption and mild malnutrition)    Past Surgical History:  Procedure Laterality Date  . BILROTH II PROCEDURE     with gastrojejunostomy.  *Loss of duodenal continuity--malabsorption, esp of fat soluble vitamins and iron, may occur.  Marland Kitchen CARDIOVASCULAR STRESS TEST  01/30/14   Myocardial perfusion imaging: NORMAL ([name]).  . CHOLECYSTECTOMY  05/2016   Open.    . COLON RESECTION Right    right colon resection and ileocolonic anastamosis  . COLONOSCOPY N/A 07/02/2013   Procedure: COLONOSCOPY;  Surgeon: Rogene Houston, MD;  [facility];  Service: Endoscopy;  Laterality: N/A;  200  . ESOPHAGEAL DILATION N/A 10/30/2014   Procedure: ESOPHAGEAL DILATION;  Surgeon: Rogene Houston, MD;  [facility];  Service: Endoscopy;  Laterality: N/A;  . ESOPHAGEAL DILATION N/A 02/12/2015   Procedure: ESOPHAGEAL DILATION;  Surgeon: Rogene Houston, MD;  [facility];  Service: Endoscopy;  Laterality: N/A;  . ESOPHAGEAL DILATION N/A 01/21/2016   Procedure: ESOPHAGEAL DILATION;  Surgeon: Rogene Houston, MD;  [facility];  Service: Endoscopy;  Laterality: N/A;  . ESOPHAGEAL DILATION N/A 10/13/2016   H pylori neg.  Esophagitis, esoph stricture, and portal hypertensive gastropathy noted.  Procedure: ESOPHAGEAL DILATION;  Surgeon: Rogene Houston, MD;  [facility];  Service: Endoscopy;  Laterality: N/A;  . ESOPHAGOGASTRODUODENOSCOPY N/A 08/14/2013   Procedure: ESOPHAGOGASTRODUODENOSCOPY (EGD);  Surgeon: Rogene Houston, MD;  [facility];  Service: Endoscopy;  Laterality: N/A;  300  . ESOPHAGOGASTRODUODENOSCOPY N/A 10/30/2014   Procedure: ESOPHAGOGASTRODUODENOSCOPY (EGD);   Surgeon: Rogene Houston, MD;  [facility];  Service: Endoscopy;  Laterality: N/A;  940  . ESOPHAGOGASTRODUODENOSCOPY N/A 02/12/2015   Procedure: ESOPHAGOGASTRODUODENOSCOPY (EGD);  Surgeon: Rogene Houston, MD;  [facility];  Service: Endoscopy;  Laterality: N/A;  105  . ESOPHAGOGASTRODUODENOSCOPY  01/21/16   gastritis/reactive gastropathy.  H pylori neg.  Bx benign.  . ESOPHAGOGASTRODUODENOSCOPY N/A 01/21/2016   Procedure: ESOPHAGOGASTRODUODENOSCOPY (EGD);  Surgeon: Rogene Houston, MD;  [facility];  Service: Endoscopy;  Laterality: N/A;  1:10 - moved to 5/26 @ 8:25  . ESOPHAGOGASTRODUODENOSCOPY  10/13/2016   Benign gastric mucosa.  Chronic inactive gastritis.  H pylori NEG (Dr. Laural Golden)  . ESOPHAGOGASTRODUODENOSCOPY (EGD) WITH PROPOFOL N/A 10/13/2016   Procedure: ESOPHAGOGASTRODUODENOSCOPY (EGD) WITH PROPOFOL;  Surgeon: Rogene Houston, MD;  [facility];  Service: Endoscopy;  Laterality: N/A;  9:15  . HIATAL HERNIA REPAIR  09/2007   He underwent repair of large hiatal hernia and pyloroplasty which was complicated by pyloroplasty leak and subsequent gastrojejunostomy  . NISSEN FUNDOPLICATION     x2  . NOCTURNAL POLYSOMNOGRAM  05/2013   Severe periodic limb movement d/o: trial of dopamine agonist such as requip was recommended.  Marland Kitchen PYLOROPLASTY  2009  . TRANSTHORACIC ECHOCARDIOGRAM  12/2013; 12/2016   2015: EF 65-70%, mild LVH, grade I DD, no valvular problems.  2018: EF 55-60%, no wall motion abnormalities, valves normal except aortic leaflets and annulus calcifications w/out any stenosis or regurg.    Outpatient Medications Prior to Visit  Medication Sig Dispense Refill  . FLUoxetine (PROZAC) 40 MG capsule TAKE 1 CAPSULE (40 MG TOTAL) BY MOUTH DAILY. 30 capsule 5  . gabapentin (NEURONTIN) 300 MG capsule 1 cap po morning, noon, and evening, and 2 caps po qhs 150 capsule 6  . loperamide (IMODIUM A-D) 2 MG tablet Take 1 tablet (2 mg total) by mouth 3  (three) times daily before meals. 30 tablet 0  . loratadine (CLARITIN) 10 MG tablet Take 10 mg by mouth daily as needed for allergies.     Marland Kitchen LORazepam (ATIVAN) 1 MG tablet TAKE 1 TABLET BY MOUTH TWICE A DAY 60 tablet 0  . Multiple Vitamin (MULTIVITAMIN WITH MINERALS) TABS tablet Take 1 tablet by mouth daily.    Marland Kitchen omeprazole (PRILOSEC) 20 MG capsule Take 1 capsule (20 mg total) by mouth daily. 30 capsule 5  . ondansetron (ZOFRAN) 4 MG tablet TAKE ONE TABLET BY MOUTH TWICE DAILY AS NEEDED FOR NAUSEA OR VOMITING. DO NOT TAKE DAILY. 30 tablet 0  . testosterone cypionate (DEPOTESTOSTERONE CYPIONATE) 200 MG/ML injection Inject 1 mL (200 mg total) into the muscle every 14 (fourteen) days. 10 mL 1  . Thiamine Mononitrate (VITAMIN B1) 100 MG TABS Take 1 tablet by mouth daily.    . traZODone (DESYREL) 50 MG tablet 1-2 tabs po qhs for insomnia 60 tablet 6  . vitamin A 8000 UNIT capsule Take 1 capsule (8,000 Units total) by mouth daily. 90 capsule 0  . vitamin E 1000 UNIT capsule Take 1 capsule (1,000 Units total) by mouth daily. 90 capsule 0  . ferrous sulfate 325 (65 FE) MG EC tablet Take 1 tablet (325 mg total) by mouth daily with breakfast. (Patient not taking: Reported on 10/18/2017)    . folic acid (FOLVITE) 1 MG tablet Take 1 tablet by mouth daily.     No facility-administered medications prior to visit.     Allergies  Allergen Reactions  . Meloxicam     Other reaction(s): Unknown Per daughter   . Tramadol     Other reaction(s): Unknown  . Acetaminophen Other (See Comments)    Liver issues  . Norvasc [Amlodipine] Other (See Comments)    "felt bad"    ROS As per HPI  PE: Blood pressure (!) 149/73, pulse 79, temperature 97.9 F (36.6 C), temperature source Oral, resp. rate 16, height 5\' 10"  (1.778 m), weight 164 lb 4 oz (74.5 kg), SpO2 96 %. Gen: Alert, well appearing.  Patient is oriented to person, place, time, and situation. AFFECT: pleasant, lucid thought and speech. No jaundice or  pallor. CV: RRR, (80-85 rate), soft systolic murmur, no rub/gallop.  No diastolic murmur. Chest is clear, no wheezing or rales. Normal symmetric air entry throughout both lung fields. No chest wall deformities or tenderness.  EXT: no clubbing, cyanosis, or edema.    LABS:  Lab Results  Component Value Date   HGBA1C 4.8 03/23/2017    Lab Results  Component Value Date   PSA 1.43 03/20/2016    Lab Results  Component Value Date   INR 1.3 (H) 01/11/2017   INR 0.95 03/13/2013   INR 1.21 10/02/2011    Lab Results  Component Value Date   TESTOSTERONE 1,114 (H) 03/22/2017      Chemistry      Component Value Date/Time   NA 137 04/09/2017 1615   NA 137 11/07/2016   K 3.9 04/09/2017 1615   CL 108 04/09/2017 1615   CO2 25 04/09/2017 1615   BUN 7 04/09/2017 1615   BUN 16 11/07/2016   CREATININE 1.29 04/09/2017 1615   CREATININE 0.83 02/09/2016 1431      Component Value Date/Time   CALCIUM 7.9 (L) 04/09/2017 1615   ALKPHOS 114 04/09/2017 1615   AST 20 04/09/2017 1615   ALT 17 04/09/2017 1615   BILITOT 1.1 04/09/2017 1615     Lab Results  Component Value Date   WBC 5.3 08/14/2017   HGB 14.1 08/14/2017   HCT 41.0 08/14/2017   MCV 91.9 08/14/2017   PLT 134 (L) 08/14/2017   Lab Results  Component Value Date   IRON 53 03/22/2017   IRON 44 (L) 03/22/2017   TIBC 167 (L) 03/22/2017   FERRITIN 22.0 03/22/2017   Lab Results  Component Value Date   CHOL 165 01/24/2014   HDL 48 01/24/2014   LDLCALC 90 01/24/2014   LDLDIRECT 144 (H) 06/02/2013   TRIG 135 01/24/2014   CHOLHDL 3.4 01/24/2014   Lab Results  Component Value Date   TSH 2.56 03/20/2016   Lab Results  Component Value Date   VITAMINB12 1,426 (H) 01/04/2017   12 lead EKG today: sinus, rate 74, large voltage-? Peaked? T waves V3-V5.  Isolated TWI in V1 and aVL. No ectopy, no hypertrophy, no Q waves, no ST segment abnormalities.  Compared to EKG 03/23/16 the large voltage T waves are new.  IMPRESSION AND  PLAN:  1) Alcoholism: remaining abstinent at this time. Living with his parents in [place], [place]. His mind seems to be clearing and GI issues improving the longer he stays off alcohol.  2) Alcoholic liver dz (hepatic steatosis/steatohepatitis but no cirrhosis): monitor hepatic panel/lytes/cr. Continue no alcohol.  3) Male hypogonadism: some improved energy level and libido on 200 mg testost cypionate q2wks, but testost level rose too high. He does not feel any improvement on 100 mg q 2 week dosing.  He declines retry of topical testost preparation. Since he is so far away [facility], [place]), adjusting testost injection dosing and doing approp lab f/u is not possible. I recommended he see an endocrinologist for consideration of clomiphene treatment.  He'll find an endocrine MD in [place], [place] and call our office to request referral.  4) Intestinal malabsorption: improving gradually the longer he is off alcohol. Vita E and A levels improved signif, will recheck iron levels today + PT/INR.  5 ) Chest pain, possible angina.   EKG here today reassuring. He is unable to exercise adequately for treadmill stress test. Leane Call ordered to be done at [facility]. Strongly encouraged smoking cessation.   Recommended pt start ASA 81mg  once daily.  Spent 40 min with pt today, with >50% of this time spent in counseling and care coordination regarding the above problems.  An After Visit Summary was printed and  given to the patient.  FOLLOW UP: 3 mo RCI  Signed:  Crissie Sickles, MD           10/18/2017

## 2017-10-18 NOTE — Patient Instructions (Addendum)
Call our office to tell what Endocrinology office in [facility] you want a referral to.  Start taking 81 mg aspirin once a day.

## 2017-10-19 ENCOUNTER — Encounter: Payer: Self-pay | Admitting: Family Medicine

## 2017-10-19 LAB — IRON AND TIBC
IRON SATURATION: 80 % — AB (ref 15–55)
IRON: 279 ug/dL — AB (ref 38–169)
TIBC: 347 ug/dL (ref 250–450)
UIBC: 68 ug/dL — AB (ref 111–343)

## 2017-10-22 ENCOUNTER — Telehealth (HOSPITAL_COMMUNITY): Payer: Self-pay | Admitting: *Deleted

## 2017-10-22 ENCOUNTER — Encounter (HOSPITAL_COMMUNITY): Payer: Self-pay

## 2017-10-22 NOTE — Telephone Encounter (Signed)
Patient given detailed instructions per Myocardial Perfusion Study Information Sheet for the test on 10/25/17 at 0945. Patient notified to arrive 15 minutes early and that it is imperative to arrive on time for appointment to keep from having the test rescheduled.  If you need to cancel or reschedule your appointment, please call the office within 24 hours of your appointment. . Patient verbalized understanding.Hawken Bielby, Ranae Palms

## 2017-10-25 ENCOUNTER — Ambulatory Visit (HOSPITAL_COMMUNITY): Payer: Medicare Other | Attending: Cardiology

## 2017-10-25 DIAGNOSIS — R0789 Other chest pain: Secondary | ICD-10-CM | POA: Diagnosis not present

## 2017-10-25 LAB — MYOCARDIAL PERFUSION IMAGING
CHL CUP NUCLEAR SSS: 7
CHL CUP RESTING HR STRESS: 71 {beats}/min
LV sys vol: 35 mL
LVDIAVOL: 97 mL (ref 62–150)
NUC STRESS TID: 0.98
Peak HR: 94 {beats}/min
RATE: 0.36
SDS: 4
SRS: 3

## 2017-10-25 MED ORDER — TECHNETIUM TC 99M TETROFOSMIN IV KIT
10.2000 | PACK | Freq: Once | INTRAVENOUS | Status: AC | PRN
  Administered 2017-10-25: 10.2 via INTRAVENOUS
  Filled 2017-10-25: qty 11

## 2017-10-25 MED ORDER — REGADENOSON 0.4 MG/5ML IV SOLN
0.4000 mg | Freq: Once | INTRAVENOUS | Status: AC
  Administered 2017-10-25: 0.4 mg via INTRAVENOUS

## 2017-10-25 MED ORDER — TECHNETIUM TC 99M TETROFOSMIN IV KIT
31.1000 | PACK | Freq: Once | INTRAVENOUS | Status: AC | PRN
  Administered 2017-10-25: 31.1 via INTRAVENOUS
  Filled 2017-10-25: qty 32

## 2017-10-30 ENCOUNTER — Encounter: Payer: Self-pay | Admitting: Family Medicine

## 2017-10-30 ENCOUNTER — Other Ambulatory Visit: Payer: Self-pay | Admitting: Family Medicine

## 2017-10-30 DIAGNOSIS — R9439 Abnormal result of other cardiovascular function study: Secondary | ICD-10-CM

## 2017-10-30 DIAGNOSIS — I208 Other forms of angina pectoris: Secondary | ICD-10-CM

## 2017-11-20 ENCOUNTER — Other Ambulatory Visit (INDEPENDENT_AMBULATORY_CARE_PROVIDER_SITE_OTHER): Payer: Self-pay | Admitting: Internal Medicine

## 2017-11-20 ENCOUNTER — Other Ambulatory Visit: Payer: Self-pay | Admitting: Family Medicine

## 2017-11-20 NOTE — Telephone Encounter (Signed)
[facility]  RF request for trazodone LOV: 10/18/17 Next ov: 01/16/18 Last written: 04/05/17 #60 w/ 6RF  Please advise. Thanks.

## 2017-11-22 ENCOUNTER — Ambulatory Visit: Payer: Medicare Other | Admitting: Interventional Cardiology

## 2017-12-05 DIAGNOSIS — M25562 Pain in left knee: Secondary | ICD-10-CM | POA: Diagnosis not present

## 2017-12-05 DIAGNOSIS — F1011 Alcohol abuse, in remission: Secondary | ICD-10-CM | POA: Diagnosis not present

## 2017-12-05 DIAGNOSIS — Z8719 Personal history of other diseases of the digestive system: Secondary | ICD-10-CM | POA: Diagnosis not present

## 2017-12-05 DIAGNOSIS — G8929 Other chronic pain: Secondary | ICD-10-CM | POA: Diagnosis not present

## 2017-12-05 DIAGNOSIS — G629 Polyneuropathy, unspecified: Secondary | ICD-10-CM | POA: Diagnosis not present

## 2017-12-05 DIAGNOSIS — M47816 Spondylosis without myelopathy or radiculopathy, lumbar region: Secondary | ICD-10-CM | POA: Diagnosis not present

## 2017-12-05 DIAGNOSIS — Z79899 Other long term (current) drug therapy: Secondary | ICD-10-CM | POA: Diagnosis not present

## 2017-12-05 DIAGNOSIS — Z8673 Personal history of transient ischemic attack (TIA), and cerebral infarction without residual deficits: Secondary | ICD-10-CM | POA: Diagnosis not present

## 2017-12-11 ENCOUNTER — Ambulatory Visit (INDEPENDENT_AMBULATORY_CARE_PROVIDER_SITE_OTHER): Payer: Medicare Other | Admitting: Internal Medicine

## 2017-12-11 ENCOUNTER — Encounter (INDEPENDENT_AMBULATORY_CARE_PROVIDER_SITE_OTHER): Payer: Self-pay | Admitting: Internal Medicine

## 2017-12-11 VITALS — BP 134/80 | HR 74 | Temp 97.3°F | Resp 18 | Ht 72.0 in | Wt 167.8 lb

## 2017-12-11 DIAGNOSIS — R197 Diarrhea, unspecified: Secondary | ICD-10-CM

## 2017-12-11 DIAGNOSIS — E509 Vitamin A deficiency, unspecified: Secondary | ICD-10-CM

## 2017-12-11 DIAGNOSIS — K219 Gastro-esophageal reflux disease without esophagitis: Secondary | ICD-10-CM | POA: Diagnosis not present

## 2017-12-11 DIAGNOSIS — K5000 Crohn's disease of small intestine without complications: Secondary | ICD-10-CM | POA: Diagnosis not present

## 2017-12-11 DIAGNOSIS — I208 Other forms of angina pectoris: Secondary | ICD-10-CM

## 2017-12-11 MED ORDER — LORAZEPAM 1 MG PO TABS: 1.0000 mg | ORAL_TABLET | Freq: Two times a day (BID) | ORAL | 2 refills | Status: DC

## 2017-12-11 NOTE — Patient Instructions (Signed)
Vitamin A level to be checked with next blood work.  Can wait until visit with endocrinologist.

## 2017-12-11 NOTE — Progress Notes (Signed)
Presenting complaint;  Follow-up for GERD anxiety and chronic diarrhea. History of small bowel Crohn's disease.  Subjective:  Gary Vasquez is 67 year old Caucasian male who is here for scheduled visit.  He was last seen on 08/14/2017.  He has gained another 7 pounds since his last visit.  He continues to feel better.  He is having back pain radiating to his leg.  He is scheduled for nerve block on 12/12/2017 at pain clinic in [facility].  He rarely has heartburn.  He has intermittent bilious vomiting usually once a week.  It is not as voluminous as before.  He has slight difficulty swallowing solids.  He states his dysphagia has not worsened.  He remains with 5-6 stools per day.  More than 50% of stools are formed.  Wrists are loose to semi-formed.  He denies melena or rectal bleeding. He complains of insomnia.  He states trazodone is not helping.  He states some days he takes all of his lorazepam at bedtime.    Current Medications: Outpatient Encounter Medications as of 12/11/2017  Medication Sig  . FLUoxetine (PROZAC) 40 MG capsule TAKE 1 CAPSULE (40 MG TOTAL) BY MOUTH DAILY.  Marland Kitchen gabapentin (NEURONTIN) 300 MG capsule 1 cap po morning, noon, and evening, and 2 caps po qhs  . loperamide (IMODIUM A-D) 2 MG tablet Take 1 tablet (2 mg total) by mouth 3 (three) times daily before meals.  Marland Kitchen loratadine (CLARITIN) 10 MG tablet Take 10 mg by mouth daily as needed for allergies.   Marland Kitchen LORazepam (ATIVAN) 1 MG tablet TAKE 1 TABLET BY MOUTH TWICE A DAY  . Multiple Vitamin (MULTIVITAMIN WITH MINERALS) TABS tablet Take 1 tablet by mouth daily.  Marland Kitchen omeprazole (PRILOSEC) 20 MG capsule Take 1 capsule (20 mg total) by mouth daily.  . ondansetron (ZOFRAN) 4 MG tablet TAKE ONE TABLET BY MOUTH TWICE DAILY AS NEEDED FOR NAUSEA OR VOMITING. DO NOT TAKE DAILY.  . traZODone (DESYREL) 50 MG tablet TAKE 1-2 TABLETS BY MOUTH AT BEDTIME FOR INSOMNIA  . vitamin A 8000 UNIT capsule Take 1 capsule (8,000 Units total) by  mouth daily.  Marland Kitchen testosterone cypionate (DEPOTESTOSTERONE CYPIONATE) 200 MG/ML injection Inject 1 mL (200 mg total) into the muscle every 14 (fourteen) days. (Patient not taking: Reported on 12/11/2017)  . [DISCONTINUED] vitamin E 1000 UNIT capsule Take 1 capsule (1,000 Units total) by mouth daily. (Patient not taking: Reported on 12/11/2017)   No facility-administered encounter medications on file as of 12/11/2017.      Objective: Blood pressure 134/80, pulse 74, temperature (!) 97.3 F (36.3 C), temperature source Oral, resp. rate 18, height 6' (1.829 m), weight 167 lb 12.8 oz (76.1 kg). Patient is alert and in no acute distress. Conjunctiva is pink. Sclera is nonicteric Oropharyngeal mucosa is normal. No neck masses or thyromegaly noted. Cardiac exam with regular rhythm normal S1 and S2. No murmur or gallop noted. Lungs are clear to auscultation. Abdomen is full.  He has midline scar.  Abdomen is soft.  He has mild midepigastric tenderness. No LE edema or clubbing noted.  Labs/studies Results:  Lab data from 10/18/2017 reviewed Bilirubin 2.0.  Alkaline phosphatase 121 AST 41 and ALT 24. Serum albumin 4.0. Serum iron 279 TIBC 347 and iron saturation 80%. Serum ferritin 196.6. P.o. iron was stopped by [name].  Assessment:  #1.  GERD.  GERD complicated by esophageal stricture.  He is doing much better since he has quit drinking alcohol.  He is possibly having alkaline reflux secondary  to prior gastric surgery with B2 anastomosis.  He has been evaluated for repeat surgery but deemed to be very high risk.  His disease has been complicated by esophageal stricture which has been dilated few times.  He will continue PPI and small meals.  #2.  Chronic diarrhea.  Diarrhea appears to be multifactorial.  He may have irritable bowel syndrome as well as dumping syndrome.  Diarrhea does not appear to be due to Crohn's disease which remains in remission.  Significant symptomatic improvement with  scheduled loperamide.  #3.  History of vitamin A deficiency.  Vitamin A level has come up significantly with p.o. supplementation.  Level needs to be rechecked.  #4.  Small bowel Crohn's disease.  Had surgery over 25 years ago.  He remains in remission.  Last colonoscopy was in November 2014.  #5. Anxiety.  He remains on lorazepam.  He feels he needs higher dose.  He is also having difficulty sleeping.  He would need to discuss further changes in therapy with [name] or consider referral to a psychiatrist.  I will leave him on current dose at the present time.  Patient advised to take Lorazepam as recommended.  #6.  Slight increase in AST level with normal ALT.  He denies drinking alcohol.  Plan:  New prescription given for Lorazepam 1 mg p.o. twice daily 1 month with 2 refills.  Prescription to be filled every 30 days and no earlier. Patient will go to the lab for vitamin A level. Office visit in 3 months.

## 2017-12-12 DIAGNOSIS — M47816 Spondylosis without myelopathy or radiculopathy, lumbar region: Secondary | ICD-10-CM | POA: Diagnosis not present

## 2017-12-25 DIAGNOSIS — M4726 Other spondylosis with radiculopathy, lumbar region: Secondary | ICD-10-CM | POA: Diagnosis not present

## 2017-12-25 DIAGNOSIS — M5116 Intervertebral disc disorders with radiculopathy, lumbar region: Secondary | ICD-10-CM | POA: Diagnosis not present

## 2017-12-25 DIAGNOSIS — M48061 Spinal stenosis, lumbar region without neurogenic claudication: Secondary | ICD-10-CM | POA: Diagnosis not present

## 2017-12-25 DIAGNOSIS — M2578 Osteophyte, vertebrae: Secondary | ICD-10-CM | POA: Diagnosis not present

## 2017-12-25 DIAGNOSIS — M8938 Hypertrophy of bone, other site: Secondary | ICD-10-CM | POA: Diagnosis not present

## 2017-12-25 DIAGNOSIS — M5416 Radiculopathy, lumbar region: Secondary | ICD-10-CM | POA: Diagnosis not present

## 2018-01-16 ENCOUNTER — Ambulatory Visit: Payer: Self-pay | Admitting: Family Medicine

## 2018-01-18 ENCOUNTER — Other Ambulatory Visit (INDEPENDENT_AMBULATORY_CARE_PROVIDER_SITE_OTHER): Payer: Self-pay | Admitting: Internal Medicine

## 2018-01-24 DIAGNOSIS — M545 Low back pain: Secondary | ICD-10-CM | POA: Diagnosis not present

## 2018-01-24 DIAGNOSIS — M5414 Radiculopathy, thoracic region: Secondary | ICD-10-CM | POA: Diagnosis not present

## 2018-01-24 DIAGNOSIS — G8929 Other chronic pain: Secondary | ICD-10-CM | POA: Diagnosis not present

## 2018-01-24 DIAGNOSIS — M79671 Pain in right foot: Secondary | ICD-10-CM | POA: Diagnosis not present

## 2018-01-24 DIAGNOSIS — M79672 Pain in left foot: Secondary | ICD-10-CM | POA: Diagnosis not present

## 2018-01-24 DIAGNOSIS — M5412 Radiculopathy, cervical region: Secondary | ICD-10-CM | POA: Diagnosis not present

## 2018-01-24 DIAGNOSIS — M47816 Spondylosis without myelopathy or radiculopathy, lumbar region: Secondary | ICD-10-CM | POA: Diagnosis not present

## 2018-01-24 DIAGNOSIS — M4726 Other spondylosis with radiculopathy, lumbar region: Secondary | ICD-10-CM | POA: Diagnosis not present

## 2018-01-31 DIAGNOSIS — M5011 Cervical disc disorder with radiculopathy,  high cervical region: Secondary | ICD-10-CM | POA: Diagnosis not present

## 2018-01-31 DIAGNOSIS — M50221 Other cervical disc displacement at C4-C5 level: Secondary | ICD-10-CM | POA: Diagnosis not present

## 2018-01-31 DIAGNOSIS — M4802 Spinal stenosis, cervical region: Secondary | ICD-10-CM | POA: Diagnosis not present

## 2018-01-31 DIAGNOSIS — M5414 Radiculopathy, thoracic region: Secondary | ICD-10-CM | POA: Diagnosis not present

## 2018-02-14 DIAGNOSIS — M5412 Radiculopathy, cervical region: Secondary | ICD-10-CM | POA: Diagnosis not present

## 2018-02-14 DIAGNOSIS — M50323 Other cervical disc degeneration at C6-C7 level: Secondary | ICD-10-CM | POA: Diagnosis not present

## 2018-02-14 DIAGNOSIS — M50322 Other cervical disc degeneration at C5-C6 level: Secondary | ICD-10-CM | POA: Diagnosis not present

## 2018-02-14 DIAGNOSIS — R9389 Abnormal findings on diagnostic imaging of other specified body structures: Secondary | ICD-10-CM | POA: Diagnosis not present

## 2018-02-14 DIAGNOSIS — M50122 Cervical disc disorder at C5-C6 level with radiculopathy: Secondary | ICD-10-CM | POA: Diagnosis not present

## 2018-02-14 DIAGNOSIS — M7989 Other specified soft tissue disorders: Secondary | ICD-10-CM | POA: Diagnosis not present

## 2018-02-14 DIAGNOSIS — M4802 Spinal stenosis, cervical region: Secondary | ICD-10-CM | POA: Diagnosis not present

## 2018-02-14 DIAGNOSIS — M2578 Osteophyte, vertebrae: Secondary | ICD-10-CM | POA: Diagnosis not present

## 2018-02-22 DIAGNOSIS — M2560 Stiffness of unspecified joint, not elsewhere classified: Secondary | ICD-10-CM | POA: Diagnosis not present

## 2018-02-22 DIAGNOSIS — K7460 Unspecified cirrhosis of liver: Secondary | ICD-10-CM | POA: Diagnosis not present

## 2018-02-22 DIAGNOSIS — M5416 Radiculopathy, lumbar region: Secondary | ICD-10-CM | POA: Diagnosis not present

## 2018-02-22 DIAGNOSIS — M5412 Radiculopathy, cervical region: Secondary | ICD-10-CM | POA: Diagnosis not present

## 2018-02-22 DIAGNOSIS — M4696 Unspecified inflammatory spondylopathy, lumbar region: Secondary | ICD-10-CM | POA: Diagnosis not present

## 2018-02-22 DIAGNOSIS — M546 Pain in thoracic spine: Secondary | ICD-10-CM | POA: Diagnosis not present

## 2018-02-22 DIAGNOSIS — M79671 Pain in right foot: Secondary | ICD-10-CM | POA: Diagnosis not present

## 2018-02-22 DIAGNOSIS — K5090 Crohn's disease, unspecified, without complications: Secondary | ICD-10-CM | POA: Diagnosis not present

## 2018-02-22 DIAGNOSIS — M5010 Cervical disc disorder with radiculopathy, unspecified cervical region: Secondary | ICD-10-CM | POA: Diagnosis not present

## 2018-02-22 DIAGNOSIS — M47816 Spondylosis without myelopathy or radiculopathy, lumbar region: Secondary | ICD-10-CM | POA: Diagnosis not present

## 2018-02-22 DIAGNOSIS — M545 Low back pain: Secondary | ICD-10-CM | POA: Diagnosis not present

## 2018-02-22 DIAGNOSIS — M79605 Pain in left leg: Secondary | ICD-10-CM | POA: Diagnosis not present

## 2018-02-22 DIAGNOSIS — M79672 Pain in left foot: Secondary | ICD-10-CM | POA: Diagnosis not present

## 2018-02-22 DIAGNOSIS — R200 Anesthesia of skin: Secondary | ICD-10-CM | POA: Diagnosis not present

## 2018-02-24 ENCOUNTER — Encounter: Payer: Self-pay | Admitting: Family Medicine

## 2018-03-06 DIAGNOSIS — M5412 Radiculopathy, cervical region: Secondary | ICD-10-CM | POA: Diagnosis not present

## 2018-03-06 DIAGNOSIS — G8929 Other chronic pain: Secondary | ICD-10-CM | POA: Diagnosis not present

## 2018-03-11 ENCOUNTER — Telehealth (INDEPENDENT_AMBULATORY_CARE_PROVIDER_SITE_OTHER): Payer: Self-pay | Admitting: *Deleted

## 2018-03-11 NOTE — Telephone Encounter (Signed)
Mr. Gary Vasquez called and left message that he needed to have his throat stretched again.  Discussed with Dr.Rehman and he ask that the patient be put on for EGD /ED with Propofol.   It doesn't look like he is on blood thinner or aspirin. Patient has not been called back.

## 2018-03-12 ENCOUNTER — Other Ambulatory Visit (INDEPENDENT_AMBULATORY_CARE_PROVIDER_SITE_OTHER): Payer: Self-pay | Admitting: Internal Medicine

## 2018-03-12 ENCOUNTER — Encounter (INDEPENDENT_AMBULATORY_CARE_PROVIDER_SITE_OTHER): Payer: Self-pay | Admitting: *Deleted

## 2018-03-12 DIAGNOSIS — R1310 Dysphagia, unspecified: Secondary | ICD-10-CM | POA: Insufficient documentation

## 2018-03-12 NOTE — Telephone Encounter (Signed)
EGD/ED w propofol sch'd 04/05/18, left detailed message for patient, instructions mailed

## 2018-03-22 ENCOUNTER — Other Ambulatory Visit (INDEPENDENT_AMBULATORY_CARE_PROVIDER_SITE_OTHER): Payer: Self-pay | Admitting: Internal Medicine

## 2018-03-26 NOTE — Patient Instructions (Signed)
Gary Vasquez  03/26/2018     @PREFPERIOPPHARMACY @   Your procedure is scheduled on  04/05/2018.  Report to [facility] at  [place].M.  Call this number if you have problems the morning of surgery:  [phone]   Remember:  Do not eat or drink after midnight.  You may drink clear liquids until (follow instructions given to you) .  Clear liquids allowed are:                    Water, Juice (non-citric and without pulp), Carbonated beverages, Clear Tea, Black Coffee only, Plain Jell-O only, Gatorade and Plain Popsicles only    Take these medicines the morning of surgery with A SIP OF WATER  Prozac, gabapentin, claritin, ativan(if needed), prilosec, zofran(if needed).    Do not wear jewelry, make-up or nail polish.  Do not wear lotions, powders, or perfumes, or deodorant.  Do not shave 48 hours prior to surgery.  Men may shave face and neck.  Do not bring valuables to the hospital.  [facility] is not responsible for any belongings or valuables.  Contacts, dentures or bridgework may not be worn into surgery.  Leave your suitcase in the car.  After surgery it may be brought to your room.  For patients admitted to the hospital, discharge time will be determined by your treatment team.  Patients discharged the day of surgery will not be allowed to drive home.   Name and phone number of your driver:   family Special instructions:  Follow the diet and prep instructions given to you by Dr Olevia Perches office.  Please read over the following fact sheets that you were given. Anesthesia Post-op Instructions and Care and Recovery After Surgery       Esophagogastroduodenoscopy Esophagogastroduodenoscopy (EGD) is a procedure to examine the lining of the esophagus, stomach, and first part of the small intestine (duodenum). This procedure is done to check for problems such as inflammation, bleeding, ulcers, or growths. During this procedure, a long, flexible, lighted tube  with a camera attached (endoscope) is inserted down the throat. Tell a health care provider about:  Any allergies you have.  All medicines you are taking, including vitamins, herbs, eye drops, creams, and over-the-counter medicines.  Any problems you or family members have had with anesthetic medicines.  Any blood disorders you have.  Any surgeries you have had.  Any medical conditions you have.  Whether you are pregnant or may be pregnant. What are the risks? Generally, this is a safe procedure. However, problems may occur, including:  Infection.  Bleeding.  A tear (perforation) in the esophagus, stomach, or duodenum.  Trouble breathing.  Excessive sweating.  Spasms of the larynx.  A slowed heartbeat.  Low blood pressure.  What happens before the procedure?  Follow instructions from your health care provider about eating or drinking restrictions.  Ask your health care provider about: ? Changing or stopping your regular medicines. This is especially important if you are taking diabetes medicines or blood thinners. ? Taking medicines such as aspirin and ibuprofen. These medicines can thin your blood. Do not take these medicines before your procedure if your health care provider instructs you not to.  Plan to have someone take you home after the procedure.  If you wear dentures, be ready to remove them before the procedure. What happens during the procedure?  To reduce your risk of infection,  your health care team will wash or sanitize their hands.  An IV tube will be put in a vein in your hand or arm. You will get medicines and fluids through this tube.  You will be given one or more of the following: ? A medicine to help you relax (sedative). ? A medicine to numb the area (local anesthetic). This medicine may be sprayed into your throat. It will make you feel more comfortable and keep you from gagging or coughing during the procedure. ? A medicine for pain.  A  mouth guard may be placed in your mouth to protect your teeth and to keep you from biting on the endoscope.  You will be asked to lie on your left side.  The endoscope will be lowered down your throat into your esophagus, stomach, and duodenum.  Air will be put into the endoscope. This will help your health care provider see better.  The lining of your esophagus, stomach, and duodenum will be examined.  Your health care provider may: ? Take a tissue sample so it can be looked at in a lab (biopsy). ? Remove growths. ? Remove objects (foreign bodies) that are stuck. ? Treat any bleeding with medicines or other devices that stop tissue from bleeding. ? Widen (dilate) or stretch narrowed areas of your esophagus and stomach.  The endoscope will be taken out. The procedure may vary among health care providers and hospitals. What happens after the procedure?  Your blood pressure, heart rate, breathing rate, and blood oxygen level will be monitored often until the medicines you were given have worn off.  Do not eat or drink anything until the numbing medicine has worn off and your gag reflex has returned. This information is not intended to replace advice given to you by your health care provider. Make sure you discuss any questions you have with your health care provider. Document Released: 12/15/2004 Document Revised: 01/20/2016 Document Reviewed: 07/08/2015 Elsevier Interactive Patient Education  2018 [employer]. Esophagogastroduodenoscopy, Care After Refer to this sheet in the next few weeks. These instructions provide you with information about caring for yourself after your procedure. Your health care provider may also give you more specific instructions. Your treatment has been planned according to current medical practices, but problems sometimes occur. Call your health care provider if you have any problems or questions after your procedure. What can I expect after the  procedure? After the procedure, it is common to have:  A sore throat.  Nausea.  Bloating.  Dizziness.  Fatigue.  Follow these instructions at home:  Do not eat or drink anything until the numbing medicine (local anesthetic) has worn off and your gag reflex has returned. You will know that the local anesthetic has worn off when you can swallow comfortably.  Do not drive for 24 hours if you received a medicine to help you relax (sedative).  If your health care provider took a tissue sample for testing during the procedure, make sure to get your test results. This is your responsibility. Ask your health care provider or the department performing the test when your results will be ready.  Keep all follow-up visits as told by your health care provider. This is important. Contact a health care provider if:  You cannot stop coughing.  You are not urinating.  You are urinating less than usual. Get help right away if:  You have trouble swallowing.  You cannot eat or drink.  You have throat or chest pain that  gets worse.  You are dizzy or light-headed.  You faint.  You have nausea or vomiting.  You have chills.  You have a fever.  You have severe abdominal pain.  You have black, tarry, or bloody stools. This information is not intended to replace advice given to you by your health care provider. Make sure you discuss any questions you have with your health care provider. Document Released: 07/31/2012 Document Revised: 01/20/2016 Document Reviewed: 07/08/2015 Elsevier Interactive Patient Education  2018 [employer].  Esophageal Dilatation Esophageal dilatation is a procedure to open a blocked or narrowed part of the esophagus. The esophagus is the long tube in your throat that carries food and liquid from your mouth to your stomach. The procedure is also called esophageal dilation. You may need this procedure if you have a buildup of scar tissue in your esophagus that  makes it difficult, painful, or even impossible to swallow. This can be caused by gastroesophageal reflux disease (GERD). In rare cases, people need this procedure because they have cancer of the esophagus or a problem with the way food moves through the esophagus. Sometimes you may need to have another dilatation to enlarge the opening of the esophagus gradually. Tell a health care provider about:  Any allergies you have.  All medicines you are taking, including vitamins, herbs, eye drops, creams, and over-the-counter medicines.  Any problems you or family members have had with anesthetic medicines.  Any blood disorders you have.  Any surgeries you have had.  Any medical conditions you have.  Any antibiotic medicines you are required to take before dental procedures. What are the risks? Generally, this is a safe procedure. However, problems can occur and include:  Bleeding from a tear in the lining of the esophagus.  A hole (perforation) in the esophagus.  What happens before the procedure?  Do not eat or drink anything after midnight on the night before the procedure or as directed by your health care provider.  Ask your health care provider about changing or stopping your regular medicines. This is especially important if you are taking diabetes medicines or blood thinners.  Plan to have someone take you home after the procedure. What happens during the procedure?  You will be given a medicine that makes you relaxed and sleepy (sedative).  A medicine may be sprayed or gargled to numb the back of the throat.  Your health care provider can use various instruments to do an esophageal dilatation. During the procedure, the instrument used will be placed in your mouth and passed down into your esophagus. Options include: ? Simple dilators. This instrument is carefully placed in the esophagus to stretch it. ? Guided wire bougies. In this method, a flexible tube (endoscope) is used  to insert a wire into the esophagus. The dilator is passed over this wire to enlarge the esophagus. Then the wire is removed. ? Balloon dilators. An endoscope with a small balloon at the end is passed down into the esophagus. Inflating the balloon gently stretches the esophagus and opens it up. What happens after the procedure?  Your blood pressure, heart rate, breathing rate, and blood oxygen level will be monitored often until the medicines you were given have worn off.  Your throat may feel slightly sore and will probably still feel numb. This will improve slowly over time.  You will not be allowed to eat or drink until the throat numbness has resolved.  If this is a same-day procedure, you may be allowed  to go home once you have been able to drink, urinate, and sit on the edge of the bed without nausea or dizziness.  If this is a same-day procedure, you should have a friend or family member with you for the next 24 hours after the procedure. This information is not intended to replace advice given to you by your health care provider. Make sure you discuss any questions you have with your health care provider. Document Released: 10/05/2005 Document Revised: 01/20/2016 Document Reviewed: 12/24/2013 Elsevier Interactive Patient Education  2018 [employer] Anesthesia is a term that refers to techniques, procedures, and medicines that help a person stay safe and comfortable during a medical procedure. Monitored anesthesia care, or sedation, is one type of anesthesia. Your anesthesia specialist may recommend sedation if you will be having a procedure that does not require you to be unconscious, such as:  Cataract surgery.  A dental procedure.  A biopsy.  A colonoscopy.  During the procedure, you may receive a medicine to help you relax (sedative). There are three levels of sedation:  Mild sedation. At this level, you may feel awake and relaxed. You will be  able to follow directions.  Moderate sedation. At this level, you will be sleepy. You may not remember the procedure.  Deep sedation. At this level, you will be asleep. You will not remember the procedure.  The more medicine you are given, the deeper your level of sedation will be. Depending on how you respond to the procedure, the anesthesia specialist may change your level of sedation or the type of anesthesia to fit your needs. An anesthesia specialist will monitor you closely during the procedure. Let your health care provider know about:  Any allergies you have.  All medicines you are taking, including vitamins, herbs, eye drops, creams, and over-the-counter medicines.  Any use of steroids (by mouth or as a cream).  Any problems you or family members have had with sedatives and anesthetic medicines.  Any blood disorders you have.  Any surgeries you have had.  Any medical conditions you have, such as sleep apnea.  Whether you are pregnant or may be pregnant.  Any use of cigarettes, alcohol, or street drugs. What are the risks? Generally, this is a safe procedure. However, problems may occur, including:  Getting too much medicine (oversedation).  Nausea.  Allergic reaction to medicines.  Trouble breathing. If this happens, a breathing tube may be used to help with breathing. It will be removed when you are awake and breathing on your own.  Heart trouble.  Lung trouble.  Before the procedure Staying hydrated Follow instructions from your health care provider about hydration, which may include:  Up to 2 hours before the procedure - you may continue to drink clear liquids, such as water, clear fruit juice, black coffee, and plain tea.  Eating and drinking restrictions Follow instructions from your health care provider about eating and drinking, which may include:  8 hours before the procedure - stop eating heavy meals or foods such as meat, fried foods, or fatty  foods.  6 hours before the procedure - stop eating light meals or foods, such as toast or cereal.  6 hours before the procedure - stop drinking milk or drinks that contain milk.  2 hours before the procedure - stop drinking clear liquids.  Medicines Ask your health care provider about:  Changing or stopping your regular medicines. This is especially important if you are taking diabetes medicines  or blood thinners.  Taking medicines such as aspirin and ibuprofen. These medicines can thin your blood. Do not take these medicines before your procedure if your health care provider instructs you not to.  Tests and exams  You will have a physical exam.  You may have blood tests done to show: ? How well your kidneys and liver are working. ? How well your blood can clot.  General instructions  Plan to have someone take you home from the hospital or clinic.  If you will be going home right after the procedure, plan to have someone with you for 24 hours.  What happens during the procedure?  Your blood pressure, heart rate, breathing, level of pain and overall condition will be monitored.  An IV tube will be inserted into one of your veins.  Your anesthesia specialist will give you medicines as needed to keep you comfortable during the procedure. This may mean changing the level of sedation.  The procedure will be performed. After the procedure  Your blood pressure, heart rate, breathing rate, and blood oxygen level will be monitored until the medicines you were given have worn off.  Do not drive for 24 hours if you received a sedative.  You may: ? Feel sleepy, clumsy, or nauseous. ? Feel forgetful about what happened after the procedure. ? Have a sore throat if you had a breathing tube during the procedure. ? Vomit. This information is not intended to replace advice given to you by your health care provider. Make sure you discuss any questions you have with your health care  provider. Document Released: 05/10/2005 Document Revised: 01/21/2016 Document Reviewed: 12/05/2015 Elsevier Interactive Patient Education  2018 [place], Care After These instructions provide you with information about caring for yourself after your procedure. Your health care provider may also give you more specific instructions. Your treatment has been planned according to current medical practices, but problems sometimes occur. Call your health care provider if you have any problems or questions after your procedure. What can I expect after the procedure? After your procedure, it is common to:  Feel sleepy for several hours.  Feel clumsy and have poor balance for several hours.  Feel forgetful about what happened after the procedure.  Have poor judgment for several hours.  Feel nauseous or vomit.  Have a sore throat if you had a breathing tube during the procedure.  Follow these instructions at home: For at least 24 hours after the procedure:   Do not: ? Participate in activities in which you could fall or become injured. ? Drive. ? Use heavy machinery. ? Drink alcohol. ? Take sleeping pills or medicines that cause drowsiness. ? Make important decisions or sign legal documents. ? Take care of children on your own.  Rest. Eating and drinking  Follow the diet that is recommended by your health care provider.  If you vomit, drink water, juice, or soup when you can drink without vomiting.  Make sure you have little or no nausea before eating solid foods. General instructions  Have a responsible adult stay with you until you are awake and alert.  Take over-the-counter and prescription medicines only as told by your health care provider.  If you smoke, do not smoke without supervision.  Keep all follow-up visits as told by your health care provider. This is important. Contact a health care provider if:  You keep feeling nauseous or you  keep vomiting.  You feel light-headed.  You develop  a rash.  You have a fever. Get help right away if:  You have trouble breathing. This information is not intended to replace advice given to you by your health care provider. Make sure you discuss any questions you have with your health care provider. Document Released: 12/05/2015 Document Revised: 04/05/2016 Document Reviewed: 12/05/2015 Elsevier Interactive Patient Education  [employer].

## 2018-03-27 ENCOUNTER — Telehealth (INDEPENDENT_AMBULATORY_CARE_PROVIDER_SITE_OTHER): Payer: Self-pay | Admitting: *Deleted

## 2018-03-27 NOTE — Telephone Encounter (Signed)
Patient was called and a message was left letting patient know that his RX was ready to be picked up.

## 2018-03-28 ENCOUNTER — Encounter (HOSPITAL_COMMUNITY): Payer: Self-pay

## 2018-03-28 ENCOUNTER — Other Ambulatory Visit: Payer: Self-pay

## 2018-03-28 ENCOUNTER — Encounter (HOSPITAL_COMMUNITY)
Admission: RE | Admit: 2018-03-28 | Discharge: 2018-03-28 | Disposition: A | Discharge status: Home / Self Care | Payer: Medicare Other | Source: Ambulatory Visit | Attending: Internal Medicine | Admitting: Internal Medicine

## 2018-03-28 ENCOUNTER — Other Ambulatory Visit (INDEPENDENT_AMBULATORY_CARE_PROVIDER_SITE_OTHER): Payer: Self-pay | Admitting: Internal Medicine

## 2018-03-28 DIAGNOSIS — Z01812 Encounter for preprocedural laboratory examination: Secondary | ICD-10-CM | POA: Diagnosis not present

## 2018-03-28 DIAGNOSIS — R1310 Dysphagia, unspecified: Secondary | ICD-10-CM | POA: Insufficient documentation

## 2018-03-28 DIAGNOSIS — K219 Gastro-esophageal reflux disease without esophagitis: Secondary | ICD-10-CM | POA: Insufficient documentation

## 2018-03-28 HISTORY — DX: Shortness of breath: R06.02

## 2018-03-28 LAB — BASIC METABOLIC PANEL
ANION GAP: 13 (ref 5–15)
BUN: 6 mg/dL — ABNORMAL LOW (ref 8–23)
CALCIUM: 8.9 mg/dL (ref 8.9–10.3)
CO2: 21 mmol/L — AB (ref 22–32)
CREATININE: 1.06 mg/dL (ref 0.61–1.24)
Chloride: 98 mmol/L (ref 98–111)
GLUCOSE: 108 mg/dL — AB (ref 70–99)
Potassium: 3.9 mmol/L (ref 3.5–5.1)
Sodium: 132 mmol/L — ABNORMAL LOW (ref 135–145)

## 2018-03-28 LAB — CBC WITH DIFFERENTIAL/PLATELET
BASOS ABS: 0.1 10*3/uL (ref 0.0–0.1)
BASOS PCT: 1 %
Eosinophils Absolute: 0.1 10*3/uL (ref 0.0–0.7)
Eosinophils Relative: 1 %
HEMATOCRIT: 42.1 % (ref 39.0–52.0)
Hemoglobin: 14.3 g/dL (ref 13.0–17.0)
Lymphocytes Relative: 43 %
Lymphs Abs: 2.4 10*3/uL (ref 0.7–4.0)
MCH: 32.9 pg (ref 26.0–34.0)
MCHC: 34 g/dL (ref 30.0–36.0)
MCV: 97 fL (ref 78.0–100.0)
MONO ABS: 0.6 10*3/uL (ref 0.1–1.0)
MONOS PCT: 10 %
Neutro Abs: 2.5 10*3/uL (ref 1.7–7.7)
Neutrophils Relative %: 45 %
PLATELETS: 81 10*3/uL — AB (ref 150–400)
RBC: 4.34 MIL/uL (ref 4.22–5.81)
RDW: 13.3 % (ref 11.5–15.5)
WBC: 5.5 10*3/uL (ref 4.0–10.5)

## 2018-04-05 ENCOUNTER — Ambulatory Visit (HOSPITAL_COMMUNITY)
Admission: RE | Admit: 2018-04-05 | Discharge: 2018-04-05 | Disposition: A | Discharge status: Home / Self Care | Payer: Medicare Other | Source: Ambulatory Visit | Attending: Internal Medicine | Admitting: Internal Medicine

## 2018-04-05 ENCOUNTER — Encounter (HOSPITAL_COMMUNITY): Payer: Self-pay | Admitting: *Deleted

## 2018-04-05 ENCOUNTER — Ambulatory Visit (HOSPITAL_COMMUNITY): Payer: Medicare Other | Admitting: Anesthesiology

## 2018-04-05 ENCOUNTER — Encounter (HOSPITAL_COMMUNITY)
Admission: RE | Disposition: A | Discharge status: Home / Self Care | Payer: Self-pay | Source: Ambulatory Visit | Attending: Internal Medicine

## 2018-04-05 ENCOUNTER — Other Ambulatory Visit (INDEPENDENT_AMBULATORY_CARE_PROVIDER_SITE_OTHER): Payer: Self-pay | Admitting: Internal Medicine

## 2018-04-05 DIAGNOSIS — K3189 Other diseases of stomach and duodenum: Secondary | ICD-10-CM | POA: Insufficient documentation

## 2018-04-05 DIAGNOSIS — F1020 Alcohol dependence, uncomplicated: Secondary | ICD-10-CM | POA: Insufficient documentation

## 2018-04-05 DIAGNOSIS — Z931 Gastrostomy status: Secondary | ICD-10-CM | POA: Insufficient documentation

## 2018-04-05 DIAGNOSIS — K766 Portal hypertension: Secondary | ICD-10-CM | POA: Insufficient documentation

## 2018-04-05 DIAGNOSIS — Z9049 Acquired absence of other specified parts of digestive tract: Secondary | ICD-10-CM | POA: Diagnosis not present

## 2018-04-05 DIAGNOSIS — K219 Gastro-esophageal reflux disease without esophagitis: Secondary | ICD-10-CM | POA: Insufficient documentation

## 2018-04-05 DIAGNOSIS — F419 Anxiety disorder, unspecified: Secondary | ICD-10-CM | POA: Insufficient documentation

## 2018-04-05 DIAGNOSIS — I8500 Esophageal varices without bleeding: Secondary | ICD-10-CM | POA: Diagnosis not present

## 2018-04-05 DIAGNOSIS — I8510 Secondary esophageal varices without bleeding: Secondary | ICD-10-CM | POA: Diagnosis not present

## 2018-04-05 DIAGNOSIS — K909 Intestinal malabsorption, unspecified: Secondary | ICD-10-CM | POA: Insufficient documentation

## 2018-04-05 DIAGNOSIS — K709 Alcoholic liver disease, unspecified: Secondary | ICD-10-CM | POA: Diagnosis not present

## 2018-04-05 DIAGNOSIS — F17210 Nicotine dependence, cigarettes, uncomplicated: Secondary | ICD-10-CM | POA: Insufficient documentation

## 2018-04-05 DIAGNOSIS — I69311 Memory deficit following cerebral infarction: Secondary | ICD-10-CM | POA: Insufficient documentation

## 2018-04-05 DIAGNOSIS — Z980 Intestinal bypass and anastomosis status: Secondary | ICD-10-CM | POA: Insufficient documentation

## 2018-04-05 DIAGNOSIS — I739 Peripheral vascular disease, unspecified: Secondary | ICD-10-CM | POA: Diagnosis not present

## 2018-04-05 DIAGNOSIS — K222 Esophageal obstruction: Secondary | ICD-10-CM | POA: Insufficient documentation

## 2018-04-05 DIAGNOSIS — Z79899 Other long term (current) drug therapy: Secondary | ICD-10-CM | POA: Diagnosis not present

## 2018-04-05 DIAGNOSIS — K5090 Crohn's disease, unspecified, without complications: Secondary | ICD-10-CM | POA: Insufficient documentation

## 2018-04-05 DIAGNOSIS — R1310 Dysphagia, unspecified: Secondary | ICD-10-CM | POA: Insufficient documentation

## 2018-04-05 DIAGNOSIS — F329 Major depressive disorder, single episode, unspecified: Secondary | ICD-10-CM | POA: Diagnosis not present

## 2018-04-05 DIAGNOSIS — K210 Gastro-esophageal reflux disease with esophagitis: Secondary | ICD-10-CM | POA: Insufficient documentation

## 2018-04-05 DIAGNOSIS — K3184 Gastroparesis: Secondary | ICD-10-CM | POA: Insufficient documentation

## 2018-04-05 DIAGNOSIS — K209 Esophagitis, unspecified: Secondary | ICD-10-CM | POA: Diagnosis not present

## 2018-04-05 DIAGNOSIS — I10 Essential (primary) hypertension: Secondary | ICD-10-CM | POA: Diagnosis not present

## 2018-04-05 DIAGNOSIS — R1314 Dysphagia, pharyngoesophageal phase: Secondary | ICD-10-CM | POA: Diagnosis not present

## 2018-04-05 HISTORY — PX: ESOPHAGOGASTRODUODENOSCOPY (EGD) WITH PROPOFOL: SHX5813

## 2018-04-05 HISTORY — PX: ESOPHAGEAL DILATION: SHX303

## 2018-04-05 SURGERY — ESOPHAGOGASTRODUODENOSCOPY (EGD) WITH PROPOFOL
Anesthesia: General

## 2018-04-05 MED ORDER — PROPOFOL 10 MG/ML IV BOLUS
INTRAVENOUS | Status: DC | PRN
  Administered 2018-04-05 (×2): 30 mg via INTRAVENOUS

## 2018-04-05 MED ORDER — NADOLOL 20 MG PO TABS: 20.0000 mg | ORAL_TABLET | Freq: Every day | ORAL | 11 refills | Status: AC

## 2018-04-05 MED ORDER — CHLORHEXIDINE GLUCONATE CLOTH 2 % EX PADS: 6.0000 | MEDICATED_PAD | Freq: Once | CUTANEOUS | Status: DC

## 2018-04-05 MED ORDER — LACTATED RINGERS IV SOLN
INTRAVENOUS | Status: DC
  Administered 2018-04-05: 09:00:00 via INTRAVENOUS

## 2018-04-05 MED ORDER — SUCCINYLCHOLINE CHLORIDE 20 MG/ML IJ SOLN
INTRAMUSCULAR | Status: AC
  Filled 2018-04-05: qty 1

## 2018-04-05 MED ORDER — SODIUM CHLORIDE 0.9 % IJ SOLN
INTRAMUSCULAR | Status: AC
  Filled 2018-04-05: qty 20

## 2018-04-05 MED ORDER — LIDOCAINE HCL (PF) 1 % IJ SOLN
INTRAMUSCULAR | Status: AC
  Filled 2018-04-05: qty 15

## 2018-04-05 MED ORDER — GLYCOPYRROLATE 0.2 MG/ML IJ SOLN
INTRAMUSCULAR | Status: AC
  Filled 2018-04-05: qty 3

## 2018-04-05 MED ORDER — LORAZEPAM 1 MG PO TABS: 1.0000 mg | ORAL_TABLET | Freq: Two times a day (BID) | ORAL | 1 refills | Status: DC

## 2018-04-05 MED ORDER — PROPOFOL 500 MG/50ML IV EMUL
INTRAVENOUS | Status: DC | PRN
  Administered 2018-04-05: 150 ug/kg/min via INTRAVENOUS

## 2018-04-05 MED ORDER — FENTANYL CITRATE (PF) 100 MCG/2ML IJ SOLN: 25.0000 ug | INTRAMUSCULAR | Status: DC | PRN

## 2018-04-05 MED ORDER — PROPOFOL 10 MG/ML IV BOLUS
INTRAVENOUS | Status: AC
  Filled 2018-04-05: qty 80

## 2018-04-05 MED ORDER — EPHEDRINE SULFATE 50 MG/ML IJ SOLN
INTRAMUSCULAR | Status: AC
  Filled 2018-04-05: qty 1

## 2018-04-05 NOTE — Telephone Encounter (Signed)
The Rx that was written on 03/26/2018 was lost and there is proof that the patient has not had a Rx filled since 02/15/2018. Due to this another Rx was written and sent to [facility]. Patient's daughter is aware.

## 2018-04-05 NOTE — Anesthesia Postprocedure Evaluation (Signed)
Anesthesia Post Note  Patient: Gary Vasquez  Procedure(s) Performed: ESOPHAGOGASTRODUODENOSCOPY (EGD) WITH PROPOFOL (N/A ) ESOPHAGEAL DILATION (N/A )  Patient location during evaluation: PACU Anesthesia Type: General Level of consciousness: awake and alert and oriented Pain management: pain level controlled Vital Signs Assessment: post-procedure vital signs reviewed and stable Respiratory status: spontaneous breathing Cardiovascular status: stable Postop Assessment: no apparent nausea or vomiting Anesthetic complications: no     Last Vitals:  Vitals:   04/05/18 0813  BP: (!) 145/82  Pulse: 76  Resp: (!) 23  Temp: 36.6 C  SpO2: 95%    Last Pain:  Vitals:   04/05/18 0845  TempSrc:   PainSc: 5                  ADAMS, AMY A

## 2018-04-05 NOTE — Progress Notes (Signed)
Patient and friend asking regarding Ativan Prescription.  Per Mrs. Archibald's verbalization,  She "checked at Dr. Olevia Perches office with [facility] and a prescription was there yesterday and today it was not there". Patient is "not sure, didn't pick it up".

## 2018-04-05 NOTE — Anesthesia Preprocedure Evaluation (Addendum)
Anesthesia Evaluation  Patient identified by MRN, date of birth, ID band Patient awake    Reviewed: Allergy & Precautions, NPO status , Patient's Chart, lab work & pertinent test results  Airway Mallampati: II  TM Distance: >3 FB Neck ROM: Full    Dental no notable dental hx.    Pulmonary neg pulmonary ROS, sleep apnea , COPD, Current Smoker,  Denies COPD/OSA Continues to smoke  Denies any Pulm meds    Pulmonary exam normal breath sounds clear to auscultation       Cardiovascular Exercise Tolerance: Good hypertension, Pt. on medications + Peripheral Vascular Disease  negative cardio ROS Normal cardiovascular examI Rhythm:Regular Rate:Normal     Neuro/Psych  Headaches, PSYCHIATRIC DISORDERS Anxiety Depression States ran out of Xanax. 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 Admits to drinking currently  H/o pancreatitis    Endo/Other  negative endocrine ROS  Renal/GU negative Renal ROS  negative genitourinary   Musculoskeletal negative musculoskeletal ROS (+) Arthritis , Osteoarthritis,    Abdominal   Peds negative pediatric ROS (+)  Hematology negative hematology ROS (+) anemia ,   Anesthesia Other Findings   Reproductive/Obstetrics negative OB ROS                            Anesthesia Physical Anesthesia Plan  ASA: III  Anesthesia Plan: General   Post-op Pain Management:    Induction: Intravenous  PONV Risk Score and Plan:   Airway Management Planned: Simple Face Mask  Additional Equipment:   Intra-op Plan:   Post-operative Plan:   Informed Consent: I have reviewed the patients History and  Physical, chart, labs and discussed the procedure including the risks, benefits and alternatives for the proposed anesthesia with the patient or authorized representative who has indicated his/her understanding and acceptance.   Dental advisory given  Plan Discussed with: CRNA  Anesthesia Plan Comments:         Anesthesia Quick Evaluation

## 2018-04-05 NOTE — H&P (Signed)
Gary Vasquez is an 67 y.o. male.   Chief Complaint: Patient is here for EGD and ED. HPI: Patient is 67 year old Caucasian male with multiple medical problems with chronic GERD complicated by distal esophageal stricture who presents with solid food dysphagia.  He states over the last few months he has been explained dysphagia every week.  He is staying on soft foods.  He remains with intermittent nausea and vomiting.  He states he has 2-3 episodes of vomiting per week.  He generally vomits bile and sometimes food.  No history of hematemesis melena or rectal bleeding.Marland Kitchen  He does not take aspirin. He is continuing to drink beer intermittently even though he has suffered fulminant pancreatitis few years back and history of alcoholic liver disease. Preprocedure lab studies pertinent for platelet count of 81,000.  Past Medical History:  Diagnosis Date  .    Marland Kitchen Alcoholic liver disease ([place])   . Alcoholic pancreatitis   . Alcoholism ([place])    Still drinking as of April 05, 2018  . Allergy   .    Marland Kitchen Anxiety and depression   . Arthritis   . Aspiration pneumonitis ([facility])    recurrent  . Bile reflux esophagitis    Chronic; due to gastrojejunostomy.  He was evaluated by Dr. Demetrio Lapping of [facility] last year for Roux-en-Y procedure. He decided not to proceed with surgery because of high risk. Metoclopramide resulted in diarrhea and dominant and domperodone was ineffective.   . Chest pain 09/2017   ? stable angina, + equivocal lexiscan as outpt---referred to cardiologist 10/30/17.  Marland Kitchen Chronic back pain   . Chronic diarrhea   . Crohn disease ([place])    in remission since approx 1995  . DDD (degenerative disc disease), cervical 02/2013; 01/2018   2014-MRI by Dr. Joneen Caraway.  Concern for progression of multilevel dz/chronic neck pain, failed conservative measures: has been offered C4-C7 ACDF-->pt considering as of 02/14/18  . DDD (degenerative disc disease), lumbar    L > R lumbar radiculopathy  . Depression   .  Dumping syndrome   . Encephalomalacia on imaging study 02/2013   MRI brain w/o contrast: Mild progression of left occipital lobe encephalomalacia and  . Essential hypertension, benign   . Fatty liver 02/2013; 03/2016; 03/2017   Abd u/s.  Also noted on CT's of abdomen.  No change on 03/2017 u/s except mild ascites noted.  . Gastroparesis   . GERD (gastroesophageal reflux disease)   . Heart murmur 2018   ECHO: normal except calcified aortic annulus and leaflets, w/out any stenosis or compromise of valve function.  Marland Kitchen History of esophageal stricture    Recurrent--at GE junction.  Has required multiple dilation procedures (most recent 10/13/16).  Esophagrams have shown normal swallowing function.  Marland Kitchen History of kidney stones   . History of liver failure    (alchoholic) Recovered in hosp  . Hypogonadism male    Androgel 1.62%, 2 pumps qd started 02/05/17  . Iron deficiency anemia    Malabsorption:  feraheme infusion 04/24/17 [facility].  Hb normal and iron completely repleted as of 09/2017.  . Malabsorption syndrome    Question pancreatic exocrine insufficiency, but abnorption improved signif after pt stopped alcohol for 6-9 mo (iron/hb, vitamin levels improved)  . Migraine headache   . Paraesophageal hernia   . Sleep apnea; patient denies history of sleep apnea.  Problem and who is here for a primary this is Dr. Collene Mares in [place] she what is speciality she family medicine and so  what is your official name be just volume [name] is in [place] see the ABG and your data (19 September 1951 associated with your CBC unit she she said he is just to check-put a CBC here also usually well 14-16 hemoglobin will also get a CBC to see where August February 6 months ago scratched-thank you    cannot tolerate CPAP  . SOB (shortness of breath)   . Stroke [facility])    short term memory loss.  . Subdural hematoma ([place])   . TIA (transient ischemic attack)   . Vitamin deficiency 2018   Vit A and Vit E (secondary to  malabsorption and mild malnutrition)    Past Surgical History:  Procedure Laterality Date  . BILROTH II PROCEDURE     with gastrojejunostomy.  *Loss of duodenal continuity--malabsorption, esp of fat soluble vitamins and iron, may occur.  Marland Kitchen CARDIOVASCULAR STRESS TEST  01/30/14; 09/2017   2015 Myocardial perfusion imaging: NORMAL ([name]).  09/2017 lexiscan low risk but with equivocal area in apex that could represent a small area of ischemia.  Referred pt to cardiology.  . CHOLECYSTECTOMY  05/2016   Open.    . COLON RESECTION Right    right colon resection and ileocolonic anastamosis  . COLONOSCOPY N/A 07/02/2013   Procedure: COLONOSCOPY;  Surgeon: Rogene Houston, MD;  [facility];  Service: Endoscopy;  Laterality: N/A;  200  . ESOPHAGEAL DILATION N/A 10/30/2014   Procedure: ESOPHAGEAL DILATION;  Surgeon: Rogene Houston, MD;  [facility];  Service: Endoscopy;  Laterality: N/A;  . ESOPHAGEAL DILATION N/A 02/12/2015   Procedure: ESOPHAGEAL DILATION;  Surgeon: Rogene Houston, MD;  [facility];  Service: Endoscopy;  Laterality: N/A;  . ESOPHAGEAL DILATION N/A 01/21/2016   Procedure: ESOPHAGEAL DILATION;  Surgeon: Rogene Houston, MD;  [facility];  Service: Endoscopy;  Laterality: N/A;  . ESOPHAGEAL DILATION N/A 10/13/2016   H pylori neg.  Esophagitis, esoph stricture, and portal hypertensive gastropathy noted.  Procedure: ESOPHAGEAL DILATION;  Surgeon: Rogene Houston, MD;  [facility];  Service: Endoscopy;  Laterality: N/A;  . ESOPHAGOGASTRODUODENOSCOPY N/A 08/14/2013   Procedure: ESOPHAGOGASTRODUODENOSCOPY (EGD);  Surgeon: Rogene Houston, MD;  [facility];  Service: Endoscopy;  Laterality: N/A;  300  . ESOPHAGOGASTRODUODENOSCOPY N/A 10/30/2014   Procedure: ESOPHAGOGASTRODUODENOSCOPY (EGD);  Surgeon: Rogene Houston, MD;  [facility];  Service: Endoscopy;  Laterality: N/A;  940  . ESOPHAGOGASTRODUODENOSCOPY N/A  02/12/2015   Procedure: ESOPHAGOGASTRODUODENOSCOPY (EGD);  Surgeon: Rogene Houston, MD;  [facility];  Service: Endoscopy;  Laterality: N/A;  105  . ESOPHAGOGASTRODUODENOSCOPY  01/21/16   gastritis/reactive gastropathy.  H pylori neg.  Bx benign.  . ESOPHAGOGASTRODUODENOSCOPY N/A 01/21/2016   Procedure: ESOPHAGOGASTRODUODENOSCOPY (EGD);  Surgeon: Rogene Houston, MD;  [facility];  Service: Endoscopy;  Laterality: N/A;  1:10 - moved to 5/26 @ 8:25  . ESOPHAGOGASTRODUODENOSCOPY  10/13/2016   Benign gastric mucosa.  Chronic inactive gastritis.  H pylori NEG (Dr. Laural Golden)  . ESOPHAGOGASTRODUODENOSCOPY (EGD) WITH PROPOFOL N/A 10/13/2016   Procedure: ESOPHAGOGASTRODUODENOSCOPY (EGD) WITH PROPOFOL;  Surgeon: Rogene Houston, MD;  [facility];  Service: Endoscopy;  Laterality: N/A;  9:15  . HIATAL HERNIA REPAIR  09/2007   He underwent repair of large hiatal hernia and pyloroplasty which was complicated by pyloroplasty leak and subsequent gastrojejunostomy  . NISSEN FUNDOPLICATION     x2  . NOCTURNAL POLYSOMNOGRAM  05/2013   Severe periodic limb movement d/o: trial of dopamine agonist such as requip was recommended.  Marland Kitchen PYLOROPLASTY  2009  . TRANSTHORACIC ECHOCARDIOGRAM  12/2013; 12/2016   2015: EF 65-70%, mild LVH, grade I DD, no valvular problems.  2018: EF 55-60%, no wall motion abnormalities, valves normal except aortic leaflets and annulus calcifications w/out any stenosis or regurg.    Family History  Problem Relation Age of Onset  . Healthy Daughter   . Healthy Son   . Heart attack Mother        CABG  . Stroke Mother   . Heart attack Father        CABG  . Heart attack Brother        CABG   Social History:  reports that he has been smoking cigarettes. He has a 6.25 pack-year smoking history. He has never used smokeless tobacco. He reports that he does not drink alcohol or use drugs.  Allergies:  Allergies  Allergen Reactions  . Meloxicam Other (See  Comments)    Per daughter   . Tramadol Other (See Comments)    Unknown  . Acetaminophen Other (See Comments)    Liver issues  . Norvasc [Amlodipine] Other (See Comments)    "felt bad"    Medications Prior to Admission  Medication Sig Dispense Refill  . FLUoxetine (PROZAC) 40 MG capsule TAKE 1 CAPSULE (40 MG TOTAL) BY MOUTH DAILY. 30 capsule 5  . gabapentin (NEURONTIN) 300 MG capsule 1 cap po morning, noon, and evening, and 2 caps po qhs 150 capsule 6  . loperamide (IMODIUM A-D) 2 MG tablet Take 1 tablet (2 mg total) by mouth 3 (three) times daily before meals. 30 tablet 0  . loratadine (CLARITIN) 10 MG tablet Take 10 mg by mouth daily as needed for allergies.     Marland Kitchen LORazepam (ATIVAN) 1 MG tablet TAKE 1 TABLET (1 MG TOTAL) BY MOUTH 2 (TWO) TIMES DAILY. 60 tablet 1  . omeprazole (PRILOSEC) 20 MG capsule TAKE 1 CAPSULE (20 MG TOTAL) BY MOUTH DAILY. 30 capsule 0  . ondansetron (ZOFRAN) 4 MG tablet TAKE ONE TABLET BY MOUTH TWICE DAILY AS NEEDED FOR NAUSEA OR VOMITING. DO NOT TAKE DAILY. 30 tablet 0  . traZODone (DESYREL) 50 MG tablet TAKE 1-2 TABLETS BY MOUTH AT BEDTIME FOR INSOMNIA 60 tablet 6  . vitamin A 8000 UNIT capsule Take 1 capsule (8,000 Units total) by mouth daily. 90 capsule 0  . Multiple Vitamin (MULTIVITAMIN WITH MINERALS) TABS tablet Take 1 tablet by mouth daily.    Marland Kitchen testosterone cypionate (DEPOTESTOSTERONE CYPIONATE) 200 MG/ML injection Inject 1 mL (200 mg total) into the muscle every 14 (fourteen) days. (Patient not taking: Reported on 12/11/2017) 10 mL 1    No results found for this or any previous visit (from the past 48 hour(s)). No results found.  ROS  Blood pressure (!) 145/82, pulse 76, temperature 97.9 F (36.6 C), temperature source Oral, resp. rate (!) 23, height 6' (1.829 m), weight 74.8 kg, SpO2 95 %. Physical Exam  Constitutional: He appears well-developed and well-nourished.  HENT:  Mouth/Throat: Oropharynx is clear and moist.  Eyes: Conjunctivae are  normal. No scleral icterus.  Neck: No thyromegaly present.  Cardiovascular: Normal rate, regular rhythm and normal heart sounds.  No murmur heard. Respiratory: Effort normal and breath sounds normal.  GI:  Abdomen is full.  He has long right subcostal scar vertical scar in right lower quadrant of abdomen as well as multiple laparoscopy scars  across upper abdomen.  On palpation abdomen is soft with mild midepigastric tenderness.  No organomegaly or masses.  Musculoskeletal: He exhibits no edema.  Neurological: He is alert.  Skin: Skin is warm and dry.     Assessment/Plan Esophageal dysphagia. Known esophageal stricture. EGD with ED.  Hildred Laser, MD 04/05/2018, 8:35 AM

## 2018-04-05 NOTE — Op Note (Addendum)
[facility] Patient Name: Gary Vasquez Procedure Date: 04/05/2018 8:29 AM MRN: [id number] Date of Birth: Jan 19, 1951 Attending MD: Hildred Laser , MD CSN: [id number] Age: 67 Admit Type: Outpatient Procedure:                Upper GI endoscopy Indications:              For therapy of esophageal stricture Providers:                Hildred Laser, MD, Otis Peak B. Sharon Seller, RN, Randa Spike, Technician Referring MD:              Medicines:                Propofol per Anesthesia Complications:            No immediate complications. Estimated Blood Loss:     Estimated blood loss was minimal. Procedure:                Pre-Anesthesia Assessment:                           - Prior to the procedure, a History and Physical                            was performed, and patient medications and                            allergies were reviewed. The patient's tolerance of                            previous anesthesia was also reviewed. The risks                            and benefits of the procedure and the sedation                            options and risks were discussed with the patient.                            All questions were answered, and informed consent                            was obtained. Prior Anticoagulants: The patient has                            taken no previous anticoagulant or antiplatelet                            agents. ASA Grade Assessment: III - A patient with                            severe systemic disease. After reviewing the risks  and benefits, the patient was deemed in                            satisfactory condition to undergo the procedure.                           After obtaining informed consent, the endoscope was                            passed under direct vision. Throughout the                            procedure, the patient's blood pressure, pulse, and   oxygen saturations were monitored continuously. The                            GIF-H190 ([id number]) scope was introduced through the                            mouth, and advanced to the mid-jejunum. The upper                            GI endoscopy was accomplished without difficulty.                            The patient tolerated the procedure well. Scope In: 8:48:38 AM Scope Out: 9:03:50 AM Total Procedure Duration: 0 hours 15 minutes 12 seconds  Findings:      The proximal esophagus and mid esophagus were normal.      LA Grade B (one or more mucosal breaks greater than 5 mm, not extending       between the tops of two mucosal folds) esophagitis was found 39 to 43 cm       from the incisors.      Grade I varices were found in the distal esophagus.      One benign-appearing, intrinsic mild stenosis was found 42 to 43 cm from       the incisors. The stenosis was traversed. A TTS dilator was passed       through the scope. Dilation with a 15-16.5-18 mm balloon dilator was       performed to 15 mm and 16.5 mm. The dilation site was examined and       showed mild mucosal disruption, moderate improvement in luminal       narrowing and no perforation.      Evidence of a patent Billroth II gastrojejunostomy was found. This was       traversed. The efferent limb was examined 30 cm from anastomosis and was       characterized by edema and singleAVM. The afferent limb was examined.      Severe portal hypertensive gastropathy was found in the cardia, in the       gastric fundus, in the gastric body and in the gastric antrum. Impression:               - Normal proximal esophagus and mid esophagus.                           -  LA Grade B esophagitis.                           - Grade I esophageal varices.                           - Benign-appearing esophageal stenosis. Dilated.                           - Patent Billroth II gastrojejunostomy was found.                           - Portal  hypertensive gastropathy.                           - No specimens collected. Moderate Sedation:      Per Anesthesia Care Recommendation:           - Patient has a contact number available for                            emergencies. The signs and symptoms of potential                            delayed complications were discussed with the                            patient. Return to normal activities tomorrow.                            Written discharge instructions were provided to the                            patient.                           - Resume previous diet today.                           - Continue present medications.                           - Nadolol 20 mg po qd.                           - No aspirin, ibuprofen, naproxen, or other                            non-steroidal anti-inflammatory drugs. Procedure Code(s):        --- Professional ---                           [phone], Esophagogastroduodenoscopy, flexible,                            transoral; with transendoscopic balloon dilation of  esophagus (less than 30 mm diameter) Diagnosis Code(s):        --- Professional ---                           K20.9, Esophagitis, unspecified                           I85.00, Esophageal varices without bleeding                           K22.2, Esophageal obstruction                           Z98.0, Intestinal bypass and anastomosis status                           K76.6, Portal hypertension                           K31.89, Other diseases of stomach and duodenum CPT copyright 2017 American Medical Association. All rights reserved. The codes documented in this report are preliminary and upon coder review may  be revised to meet current compliance requirements. Hildred Laser, MD Hildred Laser, MD 04/05/2018 9:19:26 AM This report has been signed electronically. Number of Addenda: 0

## 2018-04-05 NOTE — Discharge Instructions (Signed)
No aspirin or NSAIDs. Resume usual medications as before. Nadolol 20 mg by mouth daily. Resume usual diet.  Remember to eat slowly and chew food thoroughly before swallowing. No driving for 24 hours.  You must not drink alcohol     Esophagogastroduodenoscopy, Care After Refer to this sheet in the next few weeks. These instructions provide you with information about caring for yourself after your procedure. Your health care provider may also give you more specific instructions. Your treatment has been planned according to current medical practices, but problems sometimes occur. Call your health care provider if you have any problems or questions after your procedure. What can I expect after the procedure? After the procedure, it is common to have:  A sore throat.  Nausea.  Bloating.  Dizziness.  Fatigue.  Follow these instructions at home:  Do not eat or drink anything until the numbing medicine (local anesthetic) has worn off and your gag reflex has returned. You will know that the local anesthetic has worn off when you can swallow comfortably.  Do not drive for 24 hours if you received a medicine to help you relax (sedative).  If your health care provider took a tissue sample for testing during the procedure, make sure to get your test results. This is your responsibility. Ask your health care provider or the department performing the test when your results will be ready.  Keep all follow-up visits as told by your health care provider. This is important. Contact a health care provider if:  You cannot stop coughing.  You are not urinating.  You are urinating less than usual. Get help right away if:  You have trouble swallowing.  You cannot eat or drink.  You have throat or chest pain that gets worse.  You are dizzy or light-headed.  You faint.  You have nausea or vomiting.  You have chills.  You have a fever.  You have severe abdominal pain.  You have  black, tarry, or bloody stools. This information is not intended to replace advice given to you by your health care provider. Make sure you discuss any questions you have with your health care provider. Document Released: 07/31/2012 Document Revised: 01/20/2016 Document Reviewed: 07/08/2015 Elsevier Interactive Patient Education  2018 [place], Care After These instructions provide you with information about caring for yourself after your procedure. Your health care provider may also give you more specific instructions. Your treatment has been planned according to current medical practices, but problems sometimes occur. Call your health care provider if you have any problems or questions after your procedure. What can I expect after the procedure? After your procedure, it is common to:  Feel sleepy for several hours.  Feel clumsy and have poor balance for several hours.  Feel forgetful about what happened after the procedure.  Have poor judgment for several hours.  Feel nauseous or vomit.  Have a sore throat if you had a breathing tube during the procedure.  Follow these instructions at home: For at least 24 hours after the procedure:   Do not: ? Participate in activities in which you could fall or become injured. ? Drive. ? Use heavy machinery. ? Drink alcohol. ? Take sleeping pills or medicines that cause drowsiness. ? Make important decisions or sign legal documents. ? Take care of children on your own.  Rest. Eating and drinking  Follow the diet that is recommended by your health care provider.  If you vomit,  drink water, juice, or soup when you can drink without vomiting.  Make sure you have little or no nausea before eating solid foods. General instructions  Have a responsible adult stay with you until you are awake and alert.  Take over-the-counter and prescription medicines only as told by your health care provider.  If  you smoke, do not smoke without supervision.  Keep all follow-up visits as told by your health care provider. This is important. Contact a health care provider if:  You keep feeling nauseous or you keep vomiting.  You feel light-headed.  You develop a rash.  You have a fever. Get help right away if:  You have trouble breathing. This information is not intended to replace advice given to you by your health care provider. Make sure you discuss any questions you have with your health care provider. Document Released: 12/05/2015 Document Revised: 04/05/2016 Document Reviewed: 12/05/2015 Elsevier Interactive Patient Education  [employer].

## 2018-04-05 NOTE — Transfer of Care (Signed)
Immediate Anesthesia Transfer of Care Note  Patient: Gary Vasquez  Procedure(s) Performed: ESOPHAGOGASTRODUODENOSCOPY (EGD) WITH PROPOFOL (N/A ) ESOPHAGEAL DILATION (N/A )  Patient Location: PACU  Anesthesia Type:MAC  Level of Consciousness: awake, alert , oriented and patient cooperative  Airway & Oxygen Therapy: Patient Spontanous Breathing and Patient connected to nasal cannula oxygen  Post-op Assessment: Report given to RN and Post -op Vital signs reviewed and stable  Post vital signs: Reviewed and stable  Last Vitals:  Vitals Value Taken Time  BP 107/72 04/05/2018  9:11 AM  Temp    Pulse 82 04/05/2018  9:12 AM  Resp 12 04/05/2018  9:12 AM  SpO2 97 % 04/05/2018  9:12 AM  Vitals shown include unvalidated device data.  Last Pain:  Vitals:   04/05/18 0845  TempSrc:   PainSc: 5       Patients Stated Pain Goal: 5 (75/17/00 1749)  Complications: No apparent anesthesia complications

## 2018-04-05 NOTE — Progress Notes (Signed)
Consulted with Dr. Laural Golden. Per Dr. Laural Golden, he cannot write an additional prescription for Ativan without the knowledge of what happened to previous written prescription for Ativan.  Patient and Friend verbalized understanding.

## 2018-04-10 ENCOUNTER — Encounter (HOSPITAL_COMMUNITY): Payer: Self-pay | Admitting: Internal Medicine

## 2018-06-03 ENCOUNTER — Ambulatory Visit (INDEPENDENT_AMBULATORY_CARE_PROVIDER_SITE_OTHER): Payer: Self-pay | Admitting: Internal Medicine

## 2018-06-05 ENCOUNTER — Other Ambulatory Visit (INDEPENDENT_AMBULATORY_CARE_PROVIDER_SITE_OTHER): Payer: Self-pay | Admitting: Internal Medicine

## 2018-06-12 DIAGNOSIS — M47812 Spondylosis without myelopathy or radiculopathy, cervical region: Secondary | ICD-10-CM | POA: Diagnosis not present

## 2018-06-18 ENCOUNTER — Ambulatory Visit (INDEPENDENT_AMBULATORY_CARE_PROVIDER_SITE_OTHER): Payer: Self-pay | Admitting: Internal Medicine

## 2018-06-24 ENCOUNTER — Encounter (INDEPENDENT_AMBULATORY_CARE_PROVIDER_SITE_OTHER): Payer: Self-pay | Admitting: *Deleted

## 2018-07-09 ENCOUNTER — Ambulatory Visit (INDEPENDENT_AMBULATORY_CARE_PROVIDER_SITE_OTHER): Payer: Self-pay | Admitting: Internal Medicine

## 2018-07-10 ENCOUNTER — Other Ambulatory Visit (INDEPENDENT_AMBULATORY_CARE_PROVIDER_SITE_OTHER): Payer: Self-pay | Admitting: Internal Medicine

## 2018-07-10 DIAGNOSIS — M47812 Spondylosis without myelopathy or radiculopathy, cervical region: Secondary | ICD-10-CM | POA: Diagnosis not present

## 2018-07-15 ENCOUNTER — Telehealth (INDEPENDENT_AMBULATORY_CARE_PROVIDER_SITE_OTHER): Payer: Self-pay | Admitting: Internal Medicine

## 2018-07-15 DIAGNOSIS — E860 Dehydration: Secondary | ICD-10-CM | POA: Diagnosis not present

## 2018-07-15 DIAGNOSIS — M7989 Other specified soft tissue disorders: Secondary | ICD-10-CM | POA: Diagnosis not present

## 2018-07-15 DIAGNOSIS — S51811A Laceration without foreign body of right forearm, initial encounter: Secondary | ICD-10-CM | POA: Diagnosis not present

## 2018-07-15 DIAGNOSIS — R350 Frequency of micturition: Secondary | ICD-10-CM | POA: Diagnosis not present

## 2018-07-15 DIAGNOSIS — W19XXXA Unspecified fall, initial encounter: Secondary | ICD-10-CM | POA: Diagnosis not present

## 2018-07-15 DIAGNOSIS — Y999 Unspecified external cause status: Secondary | ICD-10-CM | POA: Diagnosis not present

## 2018-07-15 DIAGNOSIS — I951 Orthostatic hypotension: Secondary | ICD-10-CM | POA: Diagnosis not present

## 2018-07-15 DIAGNOSIS — R0602 Shortness of breath: Secondary | ICD-10-CM | POA: Diagnosis not present

## 2018-07-15 DIAGNOSIS — M542 Cervicalgia: Secondary | ICD-10-CM | POA: Diagnosis not present

## 2018-07-15 DIAGNOSIS — M549 Dorsalgia, unspecified: Secondary | ICD-10-CM | POA: Diagnosis not present

## 2018-07-15 DIAGNOSIS — R42 Dizziness and giddiness: Secondary | ICD-10-CM | POA: Diagnosis not present

## 2018-07-15 NOTE — Telephone Encounter (Signed)
Patient left message over the weekend regarding a medication refill -

## 2018-07-16 DIAGNOSIS — F419 Anxiety disorder, unspecified: Secondary | ICD-10-CM | POA: Diagnosis not present

## 2018-07-16 DIAGNOSIS — M7989 Other specified soft tissue disorders: Secondary | ICD-10-CM | POA: Diagnosis not present

## 2018-07-16 DIAGNOSIS — R9431 Abnormal electrocardiogram [ECG] [EKG]: Secondary | ICD-10-CM | POA: Diagnosis not present

## 2018-07-16 DIAGNOSIS — F329 Major depressive disorder, single episode, unspecified: Secondary | ICD-10-CM | POA: Diagnosis not present

## 2018-07-16 DIAGNOSIS — K219 Gastro-esophageal reflux disease without esophagitis: Secondary | ICD-10-CM | POA: Diagnosis not present

## 2018-07-16 DIAGNOSIS — G4700 Insomnia, unspecified: Secondary | ICD-10-CM | POA: Diagnosis not present

## 2018-07-16 DIAGNOSIS — E872 Acidosis: Secondary | ICD-10-CM | POA: Diagnosis not present

## 2018-07-16 DIAGNOSIS — E871 Hypo-osmolality and hyponatremia: Secondary | ICD-10-CM | POA: Diagnosis not present

## 2018-07-16 DIAGNOSIS — M5412 Radiculopathy, cervical region: Secondary | ICD-10-CM | POA: Diagnosis not present

## 2018-07-16 DIAGNOSIS — N179 Acute kidney failure, unspecified: Secondary | ICD-10-CM | POA: Diagnosis not present

## 2018-07-16 DIAGNOSIS — M549 Dorsalgia, unspecified: Secondary | ICD-10-CM | POA: Diagnosis not present

## 2018-07-16 DIAGNOSIS — I951 Orthostatic hypotension: Secondary | ICD-10-CM | POA: Diagnosis not present

## 2018-07-16 DIAGNOSIS — G8929 Other chronic pain: Secondary | ICD-10-CM | POA: Diagnosis not present

## 2018-07-16 DIAGNOSIS — R296 Repeated falls: Secondary | ICD-10-CM | POA: Diagnosis not present

## 2018-07-17 ENCOUNTER — Other Ambulatory Visit (INDEPENDENT_AMBULATORY_CARE_PROVIDER_SITE_OTHER): Payer: Self-pay | Admitting: *Deleted

## 2018-07-17 DIAGNOSIS — M5412 Radiculopathy, cervical region: Secondary | ICD-10-CM | POA: Diagnosis not present

## 2018-07-17 DIAGNOSIS — K5090 Crohn's disease, unspecified, without complications: Secondary | ICD-10-CM | POA: Diagnosis not present

## 2018-07-17 DIAGNOSIS — N179 Acute kidney failure, unspecified: Secondary | ICD-10-CM | POA: Diagnosis not present

## 2018-07-17 DIAGNOSIS — E871 Hypo-osmolality and hyponatremia: Secondary | ICD-10-CM | POA: Diagnosis not present

## 2018-07-17 DIAGNOSIS — E872 Acidosis: Secondary | ICD-10-CM | POA: Diagnosis not present

## 2018-07-17 DIAGNOSIS — K219 Gastro-esophageal reflux disease without esophagitis: Secondary | ICD-10-CM | POA: Diagnosis not present

## 2018-07-17 DIAGNOSIS — F418 Other specified anxiety disorders: Secondary | ICD-10-CM | POA: Diagnosis not present

## 2018-07-17 DIAGNOSIS — M7989 Other specified soft tissue disorders: Secondary | ICD-10-CM | POA: Diagnosis not present

## 2018-07-17 DIAGNOSIS — I951 Orthostatic hypotension: Secondary | ICD-10-CM | POA: Diagnosis not present

## 2018-07-17 DIAGNOSIS — R296 Repeated falls: Secondary | ICD-10-CM | POA: Diagnosis not present

## 2018-07-17 DIAGNOSIS — G8929 Other chronic pain: Secondary | ICD-10-CM | POA: Diagnosis not present

## 2018-07-17 DIAGNOSIS — M549 Dorsalgia, unspecified: Secondary | ICD-10-CM | POA: Diagnosis not present

## 2018-07-17 MED ORDER — LORAZEPAM 1 MG PO TABS: 1.0000 mg | ORAL_TABLET | Freq: Two times a day (BID) | ORAL | 0 refills | Status: DC

## 2018-07-17 NOTE — Telephone Encounter (Signed)
Dr.Rehman approves this , however, he is not in office to send it electronic. Forwarded to Terri to send.

## 2018-07-17 NOTE — Telephone Encounter (Signed)
You can refill the Ativan

## 2018-07-17 NOTE — Telephone Encounter (Signed)
Rx has been called to the [facility].

## 2018-07-18 DIAGNOSIS — Z720 Tobacco use: Secondary | ICD-10-CM | POA: Diagnosis not present

## 2018-07-18 DIAGNOSIS — I10 Essential (primary) hypertension: Secondary | ICD-10-CM | POA: Diagnosis not present

## 2018-07-18 DIAGNOSIS — Z9181 History of falling: Secondary | ICD-10-CM | POA: Diagnosis not present

## 2018-07-18 DIAGNOSIS — Z7982 Long term (current) use of aspirin: Secondary | ICD-10-CM | POA: Diagnosis not present

## 2018-07-18 DIAGNOSIS — N179 Acute kidney failure, unspecified: Secondary | ICD-10-CM | POA: Diagnosis not present

## 2018-07-18 DIAGNOSIS — E860 Dehydration: Secondary | ICD-10-CM | POA: Diagnosis not present

## 2018-07-18 DIAGNOSIS — I951 Orthostatic hypotension: Secondary | ICD-10-CM | POA: Diagnosis not present

## 2018-07-19 MED ORDER — LORAZEPAM 1 MG PO TABS
1.00 | ORAL_TABLET | ORAL | Status: DC
Start: ? — End: 2018-07-19

## 2018-07-19 MED ORDER — ALUMINUM-MAGNESIUM-SIMETHICONE 200-200-20 MG/5ML PO SUSP
30.00 | ORAL | Status: DC
Start: ? — End: 2018-07-19

## 2018-07-19 MED ORDER — FLUOXETINE HCL 40 MG PO CAPS
40.00 | ORAL_CAPSULE | ORAL | Status: DC
Start: 2018-07-20 — End: 2018-07-19

## 2018-07-19 MED ORDER — TRAZODONE HCL 50 MG PO TABS
50.00 | ORAL_TABLET | ORAL | Status: DC
Start: ? — End: 2018-07-19

## 2018-07-19 MED ORDER — LORAZEPAM 1 MG PO TABS
2.00 | ORAL_TABLET | ORAL | Status: DC
Start: ? — End: 2018-07-19

## 2018-07-19 MED ORDER — LOPERAMIDE HCL 2 MG PO CAPS
2.00 | ORAL_CAPSULE | ORAL | Status: DC
Start: ? — End: 2018-07-19

## 2018-07-19 MED ORDER — TIZANIDINE HCL 4 MG PO TABS
4.00 | ORAL_TABLET | ORAL | Status: DC
Start: ? — End: 2018-07-19

## 2018-07-19 MED ORDER — ENOXAPARIN SODIUM 40 MG/0.4ML ~~LOC~~ SOLN
40.00 | SUBCUTANEOUS | Status: DC
Start: 2018-07-20 — End: 2018-07-19

## 2018-07-19 MED ORDER — ASPIRIN 81 MG PO CHEW
81.00 | CHEWABLE_TABLET | ORAL | Status: DC
Start: 2018-07-20 — End: 2018-07-19

## 2018-07-19 MED ORDER — ACETAMINOPHEN 325 MG PO TABS
650.00 | ORAL_TABLET | ORAL | Status: DC
Start: ? — End: 2018-07-19

## 2018-07-19 MED ORDER — THIAMINE HCL 100 MG PO TABS
100.00 | ORAL_TABLET | ORAL | Status: DC
Start: 2018-07-20 — End: 2018-07-19

## 2018-07-19 MED ORDER — TRAMADOL HCL 50 MG PO TABS
50.00 | ORAL_TABLET | ORAL | Status: DC
Start: ? — End: 2018-07-19

## 2018-07-19 MED ORDER — GABAPENTIN 300 MG PO CAPS
300.00 | ORAL_CAPSULE | ORAL | Status: DC
Start: 2018-07-19 — End: 2018-07-19

## 2018-07-19 MED ORDER — THERA-M PO TABS
1.00 | ORAL_TABLET | ORAL | Status: DC
Start: 2018-07-20 — End: 2018-07-19

## 2018-07-19 MED ORDER — BISACODYL 10 MG RE SUPP
10.00 | RECTAL | Status: DC
Start: ? — End: 2018-07-19

## 2018-07-19 MED ORDER — GENERIC EXTERNAL MEDICATION
2.00 | Status: DC
Start: ? — End: 2018-07-19

## 2018-07-19 MED ORDER — FOLIC ACID 1 MG PO TABS
1.00 | ORAL_TABLET | ORAL | Status: DC
Start: 2018-07-20 — End: 2018-07-19

## 2018-07-19 MED ORDER — PANTOPRAZOLE SODIUM 40 MG PO TBEC
40.00 | DELAYED_RELEASE_TABLET | ORAL | Status: DC
Start: 2018-07-20 — End: 2018-07-19

## 2018-07-19 MED ORDER — BISACODYL 5 MG PO TBEC
10.00 | DELAYED_RELEASE_TABLET | ORAL | Status: DC
Start: ? — End: 2018-07-19

## 2018-07-29 ENCOUNTER — Other Ambulatory Visit: Payer: Self-pay | Admitting: Family Medicine

## 2018-08-02 NOTE — Telephone Encounter (Signed)
Pt advised and voiced understanding.    Apt made for 08/07/18 at 3:30pm.

## 2018-08-06 ENCOUNTER — Encounter: Payer: Self-pay | Admitting: *Deleted

## 2018-08-06 DIAGNOSIS — F32A Depression, unspecified: Secondary | ICD-10-CM | POA: Insufficient documentation

## 2018-08-06 DIAGNOSIS — F329 Major depressive disorder, single episode, unspecified: Secondary | ICD-10-CM | POA: Insufficient documentation

## 2018-08-07 ENCOUNTER — Ambulatory Visit: Payer: Medicare Other | Admitting: Family Medicine

## 2018-08-07 ENCOUNTER — Encounter: Payer: Self-pay | Admitting: Family Medicine

## 2018-08-07 DIAGNOSIS — Z0289 Encounter for other administrative examinations: Secondary | ICD-10-CM

## 2018-08-15 ENCOUNTER — Other Ambulatory Visit (INDEPENDENT_AMBULATORY_CARE_PROVIDER_SITE_OTHER): Payer: Self-pay | Admitting: Internal Medicine

## 2018-08-22 DIAGNOSIS — Z043 Encounter for examination and observation following other accident: Secondary | ICD-10-CM | POA: Diagnosis not present

## 2018-08-22 DIAGNOSIS — D696 Thrombocytopenia, unspecified: Secondary | ICD-10-CM | POA: Diagnosis not present

## 2018-08-22 DIAGNOSIS — W19XXXA Unspecified fall, initial encounter: Secondary | ICD-10-CM | POA: Diagnosis not present

## 2018-08-22 DIAGNOSIS — R0602 Shortness of breath: Secondary | ICD-10-CM | POA: Diagnosis not present

## 2018-08-22 DIAGNOSIS — R531 Weakness: Secondary | ICD-10-CM | POA: Diagnosis not present

## 2018-08-22 DIAGNOSIS — Y998 Other external cause status: Secondary | ICD-10-CM | POA: Diagnosis not present

## 2018-08-22 DIAGNOSIS — Y9301 Activity, walking, marching and hiking: Secondary | ICD-10-CM | POA: Diagnosis not present

## 2018-08-22 DIAGNOSIS — R55 Syncope and collapse: Secondary | ICD-10-CM | POA: Diagnosis not present

## 2018-08-22 DIAGNOSIS — R42 Dizziness and giddiness: Secondary | ICD-10-CM | POA: Diagnosis not present

## 2018-08-22 DIAGNOSIS — M549 Dorsalgia, unspecified: Secondary | ICD-10-CM | POA: Diagnosis not present

## 2018-08-22 DIAGNOSIS — R2681 Unsteadiness on feet: Secondary | ICD-10-CM | POA: Diagnosis not present

## 2018-08-22 DIAGNOSIS — Y92009 Unspecified place in unspecified non-institutional (private) residence as the place of occurrence of the external cause: Secondary | ICD-10-CM | POA: Diagnosis not present

## 2018-08-22 DIAGNOSIS — G44309 Post-traumatic headache, unspecified, not intractable: Secondary | ICD-10-CM | POA: Diagnosis not present

## 2018-08-22 DIAGNOSIS — W1830XA Fall on same level, unspecified, initial encounter: Secondary | ICD-10-CM | POA: Diagnosis not present

## 2018-08-22 DIAGNOSIS — N179 Acute kidney failure, unspecified: Secondary | ICD-10-CM | POA: Diagnosis not present

## 2018-08-23 MED ORDER — FLUOXETINE HCL 40 MG PO CAPS
40.00 | ORAL_CAPSULE | ORAL | Status: DC
Start: 2018-08-24 — End: 2018-08-23

## 2018-08-23 MED ORDER — MELATONIN 3 MG PO TABS
6.00 | ORAL_TABLET | ORAL | Status: DC
Start: ? — End: 2018-08-23

## 2018-08-23 MED ORDER — CETIRIZINE HCL 10 MG PO TABS
10.00 | ORAL_TABLET | ORAL | Status: DC
Start: 2018-08-24 — End: 2018-08-23

## 2018-08-23 MED ORDER — BISACODYL 10 MG RE SUPP
10.00 | RECTAL | Status: DC
Start: ? — End: 2018-08-23

## 2018-08-23 MED ORDER — PANTOPRAZOLE SODIUM 40 MG PO TBEC
40.00 | DELAYED_RELEASE_TABLET | ORAL | Status: DC
Start: 2018-08-24 — End: 2018-08-23

## 2018-08-23 MED ORDER — BISACODYL 5 MG PO TBEC
10.00 | DELAYED_RELEASE_TABLET | ORAL | Status: DC
Start: ? — End: 2018-08-23

## 2018-08-23 MED ORDER — METHOCARBAMOL 500 MG PO TABS
750.00 | ORAL_TABLET | ORAL | Status: DC
Start: 2018-08-23 — End: 2018-08-23

## 2018-08-23 MED ORDER — LORAZEPAM 1 MG PO TABS
2.00 | ORAL_TABLET | ORAL | Status: DC
Start: ? — End: 2018-08-23

## 2018-08-23 MED ORDER — ONDANSETRON HCL 4 MG/2ML IJ SOLN
4.00 | INTRAMUSCULAR | Status: DC
Start: ? — End: 2018-08-23

## 2018-08-23 MED ORDER — ASPIRIN 81 MG PO CHEW
81.00 | CHEWABLE_TABLET | ORAL | Status: DC
Start: 2018-08-24 — End: 2018-08-23

## 2018-08-23 MED ORDER — GUAIFENESIN-DM 100-10 MG/5ML PO SYRP
10.00 | ORAL_SOLUTION | ORAL | Status: DC
Start: ? — End: 2018-08-23

## 2018-08-23 MED ORDER — GABAPENTIN 300 MG PO CAPS
300.00 | ORAL_CAPSULE | ORAL | Status: DC
Start: 2018-08-23 — End: 2018-08-23

## 2018-08-23 MED ORDER — GENERIC EXTERNAL MEDICATION
1.00 | Status: DC
Start: ? — End: 2018-08-23

## 2018-08-23 MED ORDER — ALUMINUM-MAGNESIUM-SIMETHICONE 200-200-20 MG/5ML PO SUSP
30.00 | ORAL | Status: DC
Start: ? — End: 2018-08-23

## 2018-08-23 MED ORDER — FOLIC ACID 1 MG PO TABS
1.00 | ORAL_TABLET | ORAL | Status: DC
Start: 2018-08-24 — End: 2018-08-23

## 2018-08-24 DIAGNOSIS — R9431 Abnormal electrocardiogram [ECG] [EKG]: Secondary | ICD-10-CM | POA: Diagnosis not present

## 2018-09-26 ENCOUNTER — Other Ambulatory Visit (INDEPENDENT_AMBULATORY_CARE_PROVIDER_SITE_OTHER): Payer: Self-pay | Admitting: Internal Medicine

## 2018-09-30 ENCOUNTER — Other Ambulatory Visit (INDEPENDENT_AMBULATORY_CARE_PROVIDER_SITE_OTHER): Payer: Self-pay | Admitting: Internal Medicine

## 2018-09-30 MED ORDER — LORAZEPAM 1 MG PO TABS: 1.0000 mg | ORAL_TABLET | Freq: Two times a day (BID) | ORAL | 0 refills | Status: DC

## 2018-10-02 ENCOUNTER — Other Ambulatory Visit: Payer: Self-pay | Admitting: Family Medicine

## 2018-10-02 NOTE — Telephone Encounter (Signed)
RF request for trazodone LOV: 10/18/17 Next ov: None Last written: 11/20/17 #60 w/ 6RF  Please advise. Thanks.

## 2018-10-03 ENCOUNTER — Encounter: Payer: Self-pay | Admitting: Family Medicine

## 2018-10-03 NOTE — Telephone Encounter (Signed)
I am denying this RF. Pt far overdue for follow up.-thx

## 2018-10-04 NOTE — Telephone Encounter (Signed)
Patient agreed to an appointment.  First 30 min available was 10/21/2018 @ 1pm.  Patient asking for short term supply to get him to the appointment.  Please advise?

## 2018-10-08 DIAGNOSIS — R112 Nausea with vomiting, unspecified: Secondary | ICD-10-CM | POA: Diagnosis not present

## 2018-10-08 DIAGNOSIS — K5090 Crohn's disease, unspecified, without complications: Secondary | ICD-10-CM | POA: Diagnosis not present

## 2018-10-08 DIAGNOSIS — R140 Abdominal distension (gaseous): Secondary | ICD-10-CM | POA: Diagnosis not present

## 2018-10-08 DIAGNOSIS — R9431 Abnormal electrocardiogram [ECG] [EKG]: Secondary | ICD-10-CM | POA: Diagnosis not present

## 2018-10-08 DIAGNOSIS — R109 Unspecified abdominal pain: Secondary | ICD-10-CM | POA: Diagnosis not present

## 2018-10-08 DIAGNOSIS — Z8249 Family history of ischemic heart disease and other diseases of the circulatory system: Secondary | ICD-10-CM | POA: Diagnosis not present

## 2018-10-08 DIAGNOSIS — E861 Hypovolemia: Secondary | ICD-10-CM | POA: Diagnosis not present

## 2018-10-08 DIAGNOSIS — Z8673 Personal history of transient ischemic attack (TIA), and cerebral infarction without residual deficits: Secondary | ICD-10-CM | POA: Diagnosis not present

## 2018-10-08 DIAGNOSIS — Z821 Family history of blindness and visual loss: Secondary | ICD-10-CM | POA: Diagnosis not present

## 2018-10-08 DIAGNOSIS — Z79899 Other long term (current) drug therapy: Secondary | ICD-10-CM | POA: Diagnosis not present

## 2018-10-08 DIAGNOSIS — R079 Chest pain, unspecified: Secondary | ICD-10-CM | POA: Diagnosis not present

## 2018-10-08 DIAGNOSIS — R1084 Generalized abdominal pain: Secondary | ICD-10-CM | POA: Diagnosis not present

## 2018-10-08 DIAGNOSIS — E876 Hypokalemia: Secondary | ICD-10-CM | POA: Diagnosis not present

## 2018-10-08 DIAGNOSIS — Z888 Allergy status to other drugs, medicaments and biological substances status: Secondary | ICD-10-CM | POA: Diagnosis not present

## 2018-10-08 DIAGNOSIS — Z886 Allergy status to analgesic agent status: Secondary | ICD-10-CM | POA: Diagnosis not present

## 2018-10-08 DIAGNOSIS — E871 Hypo-osmolality and hyponatremia: Secondary | ICD-10-CM | POA: Diagnosis not present

## 2018-10-08 DIAGNOSIS — K7460 Unspecified cirrhosis of liver: Secondary | ICD-10-CM | POA: Diagnosis not present

## 2018-10-08 DIAGNOSIS — R197 Diarrhea, unspecified: Secondary | ICD-10-CM | POA: Diagnosis not present

## 2018-10-08 DIAGNOSIS — K219 Gastro-esophageal reflux disease without esophagitis: Secondary | ICD-10-CM | POA: Diagnosis not present

## 2018-10-08 DIAGNOSIS — K567 Ileus, unspecified: Secondary | ICD-10-CM | POA: Diagnosis not present

## 2018-10-08 DIAGNOSIS — I951 Orthostatic hypotension: Secondary | ICD-10-CM | POA: Diagnosis not present

## 2018-10-08 DIAGNOSIS — E860 Dehydration: Secondary | ICD-10-CM | POA: Diagnosis not present

## 2018-10-08 DIAGNOSIS — R531 Weakness: Secondary | ICD-10-CM | POA: Diagnosis not present

## 2018-10-08 DIAGNOSIS — G4700 Insomnia, unspecified: Secondary | ICD-10-CM | POA: Diagnosis not present

## 2018-10-08 DIAGNOSIS — E872 Acidosis: Secondary | ICD-10-CM | POA: Diagnosis not present

## 2018-10-09 DIAGNOSIS — I951 Orthostatic hypotension: Secondary | ICD-10-CM | POA: Diagnosis not present

## 2018-10-09 DIAGNOSIS — K567 Ileus, unspecified: Secondary | ICD-10-CM | POA: Diagnosis not present

## 2018-10-09 DIAGNOSIS — E871 Hypo-osmolality and hyponatremia: Secondary | ICD-10-CM | POA: Diagnosis not present

## 2018-10-09 DIAGNOSIS — E872 Acidosis: Secondary | ICD-10-CM | POA: Diagnosis not present

## 2018-10-09 DIAGNOSIS — K5090 Crohn's disease, unspecified, without complications: Secondary | ICD-10-CM | POA: Diagnosis not present

## 2018-10-09 DIAGNOSIS — K7460 Unspecified cirrhosis of liver: Secondary | ICD-10-CM | POA: Diagnosis not present

## 2018-10-14 MED ORDER — GABAPENTIN 300 MG PO CAPS
600.00 | ORAL_CAPSULE | ORAL | Status: DC
Start: 2018-10-14 — End: 2018-10-14

## 2018-10-14 MED ORDER — FOLIC ACID 1 MG PO TABS
1.00 | ORAL_TABLET | ORAL | Status: DC
Start: 2018-10-15 — End: 2018-10-14

## 2018-10-14 MED ORDER — PANTOPRAZOLE SODIUM 40 MG PO TBEC
40.00 | DELAYED_RELEASE_TABLET | ORAL | Status: DC
Start: 2018-10-15 — End: 2018-10-14

## 2018-10-14 MED ORDER — THIAMINE HCL 100 MG PO TABS
100.00 | ORAL_TABLET | ORAL | Status: DC
Start: 2018-10-15 — End: 2018-10-14

## 2018-10-14 MED ORDER — GENERIC EXTERNAL MEDICATION
1.00 | Status: DC
Start: 2018-10-15 — End: 2018-10-14

## 2018-10-14 MED ORDER — TRAZODONE HCL 50 MG PO TABS
100.00 | ORAL_TABLET | ORAL | Status: DC
Start: ? — End: 2018-10-14

## 2018-10-14 MED ORDER — GENERIC EXTERNAL MEDICATION
5.00 | Status: DC
Start: ? — End: 2018-10-14

## 2018-10-14 MED ORDER — FLUOXETINE HCL 40 MG PO CAPS
40.00 | ORAL_CAPSULE | ORAL | Status: DC
Start: 2018-10-15 — End: 2018-10-14

## 2018-10-14 MED ORDER — CETIRIZINE HCL 10 MG PO TABS
10.00 | ORAL_TABLET | ORAL | Status: DC
Start: 2018-10-15 — End: 2018-10-14

## 2018-10-14 MED ORDER — GABAPENTIN 300 MG PO CAPS
300.00 | ORAL_CAPSULE | ORAL | Status: DC
Start: 2018-10-15 — End: 2018-10-14

## 2018-10-14 MED ORDER — ALBUTEROL SULFATE (2.5 MG/3ML) 0.083% IN NEBU
2.50 | INHALATION_SOLUTION | RESPIRATORY_TRACT | Status: DC
Start: ? — End: 2018-10-14

## 2018-10-14 MED ORDER — LORAZEPAM 1 MG PO TABS
1.00 | ORAL_TABLET | ORAL | Status: DC
Start: ? — End: 2018-10-14

## 2018-10-14 MED ORDER — ENOXAPARIN SODIUM 40 MG/0.4ML ~~LOC~~ SOLN
40.00 | SUBCUTANEOUS | Status: DC
Start: 2018-10-15 — End: 2018-10-14

## 2018-10-14 MED ORDER — DEXTROSE-NACL 5-0.9 % IV SOLN
INTRAVENOUS | Status: DC
Start: ? — End: 2018-10-14

## 2018-10-14 MED ORDER — LORAZEPAM 1 MG PO TABS
2.00 | ORAL_TABLET | ORAL | Status: DC
Start: ? — End: 2018-10-14

## 2018-10-17 ENCOUNTER — Ambulatory Visit (INDEPENDENT_AMBULATORY_CARE_PROVIDER_SITE_OTHER): Payer: Self-pay | Admitting: Internal Medicine

## 2018-10-21 ENCOUNTER — Ambulatory Visit: Payer: Self-pay | Admitting: Family Medicine

## 2018-10-21 NOTE — Progress Notes (Deleted)
10/21/2018  CC: No chief complaint on file.   Patient is a 68 y.o. Caucasian male who presents for  hospital follow up. Dates hospitalized: 2/11-2/17, 2020. Days since d/c from hospital: 7 days Patient was discharged from hospital to home. Reason for admission to hospital: decreased BMs, abd bloating, orthostatic dizziness-->found to have ileus on CT a/p.    I have reviewed patient's discharge summary plus pertinent specific notes, labs, and imaging from the hospitalization.    {current status of patient, symptoms, etc}  Medication reconciliation was done today and patient {is not} {is} taking meds as recommended by discharging hospitalist/specialist.    PMH:  Past Medical History:  Diagnosis Date  . Alcoholic cirrhosis ([place])    with portal hypertensive gastropathy, grd I esophageal varices (03/2018 EGD), +thrombocytopenia  . Alcoholic hepatitis   . Alcoholic pancreatitis   . Alcoholism ([place])    Still drinking as of 09/2016  . Anxiety and depression   . Arthritis   . Aspiration pneumonitis (HCC)    recurrent  . Bile reflux esophagitis    Chronic; due to gastrojejunostomy.  He was evaluated by Dr. Demetrio Lapping of [facility] last year for Roux-en-Y procedure. He decided not to proceed with surgery because of high risk. Metoclopramide resulted in diarrhea and dominant and domperodone was ineffective.   . Chest pain 09/2017   ? stable angina, + equivocal lexiscan as outpt---referred to cardiologist 10/30/17.  Marland Kitchen Chronic back pain   . Chronic diarrhea    multifactorial-->improved with scheduled imodium as of 2019  . Crohn disease ([place])    in remission since approx 1995  . DDD (degenerative disc disease), cervical 02/2013; 01/2018   2014-MRI by Dr. Joneen Caraway.  Concern for progression of multilevel dz/chronic neck pain, failed conservative measures: has been offered C4-C7 ACDF-->pt considering as of 02/14/18  . DDD (degenerative disc disease), lumbar    L > R lumbar radiculopathy  .  Depression   . Dumping syndrome   . Encephalomalacia on imaging study 02/2013   MRI brain w/o contrast: Mild progression of left occipital lobe encephalomalacia and  . Essential hypertension, benign   . Fatty liver 02/2013; 03/2016; 03/2017   Abd u/s.  Also noted on CT's of abdomen.  No change on 03/2017 u/s except mild ascites noted.  . Gastroparesis   . GERD (gastroesophageal reflux disease)   . Heart murmur 2018   ECHO: normal except calcified aortic annulus and leaflets, w/out any stenosis or compromise of valve function.  Marland Kitchen History of esophageal stricture    Recurrent--at GE junction.  Has required multiple dilation procedures (most recent 03/2018).  Esophagrams have shown normal swallowing function.  Marland Kitchen History of kidney stones   . History of liver failure    (alchoholic) Recovered in hosp  . Hypogonadism male    Androgel 1.62%, 2 pumps qd started 02/05/17  . Iron deficiency anemia    Malabsorption:  feraheme infusion 04/24/17 [facility].  Hb normal and iron completely repleted as of 09/2017.  . Malabsorption syndrome    Question pancreatic exocrine insufficiency, but abnorption improved signif after pt stopped alcohol for 6-9 mo (iron/hb, vitamin levels improved)  . Migraine headache   . OSA (obstructive sleep apnea)    cannot tolerate CPAP  . Paraesophageal hernia   . Portal hypertensive gastropathy (HCC) 2019   nadolol  . SOB (shortness of breath)   . Stroke [facility])    short term memory loss.  . Subdural hematoma ([place])   . TIA (transient ischemic attack)   .  Vitamin deficiency 2018   Vit A and Vit E (secondary to malabsorption and mild malnutrition)    PSH:  Past Surgical History:  Procedure Laterality Date  . BILROTH II PROCEDURE     with gastrojejunostomy.  *Loss of duodenal continuity--malabsorption, esp of fat soluble vitamins and iron, may occur.  Marland Kitchen CARDIOVASCULAR STRESS TEST  01/30/14; 09/2017   2015 Myocardial perfusion imaging: NORMAL ([name]).  09/2017 lexiscan low risk  but with equivocal area in apex that could represent a small area of ischemia.  Referred pt to cardiology.  . CHOLECYSTECTOMY  05/2016   Open.    . COLON RESECTION Right    right colon resection and ileocolonic anastamosis  . COLONOSCOPY N/A 07/02/2013   Procedure: COLONOSCOPY;  Surgeon: Rogene Houston, MD;  [facility];  Service: Endoscopy;  Laterality: N/A;  200  . ESOPHAGEAL DILATION N/A 10/30/2014   Procedure: ESOPHAGEAL DILATION;  Surgeon: Rogene Houston, MD;  [facility];  Service: Endoscopy;  Laterality: N/A;  . ESOPHAGEAL DILATION N/A 02/12/2015   Procedure: ESOPHAGEAL DILATION;  Surgeon: Rogene Houston, MD;  [facility];  Service: Endoscopy;  Laterality: N/A;  . ESOPHAGEAL DILATION N/A 01/21/2016   Procedure: ESOPHAGEAL DILATION;  Surgeon: Rogene Houston, MD;  [facility];  Service: Endoscopy;  Laterality: N/A;  . ESOPHAGEAL DILATION N/A 10/13/2016   H pylori neg.  Esophagitis, esoph stricture, and portal hypertensive gastropathy noted.  Procedure: ESOPHAGEAL DILATION;  Surgeon: Rogene Houston, MD;  [facility];  Service: Endoscopy;  Laterality: N/A;  . ESOPHAGEAL DILATION N/A 04/05/2018   Procedure: ESOPHAGEAL DILATION;  Surgeon: Rogene Houston, MD;  [facility];  Service: Endoscopy;  Laterality: N/A;  . ESOPHAGOGASTRODUODENOSCOPY N/A 08/14/2013   Procedure: ESOPHAGOGASTRODUODENOSCOPY (EGD);  Surgeon: Rogene Houston, MD;  [facility];  Service: Endoscopy;  Laterality: N/A;  300  . ESOPHAGOGASTRODUODENOSCOPY N/A 10/30/2014   Procedure: ESOPHAGOGASTRODUODENOSCOPY (EGD);  Surgeon: Rogene Houston, MD;  [facility];  Service: Endoscopy;  Laterality: N/A;  940  . ESOPHAGOGASTRODUODENOSCOPY N/A 02/12/2015   Procedure: ESOPHAGOGASTRODUODENOSCOPY (EGD);  Surgeon: Rogene Houston, MD;  [facility];  Service: Endoscopy;  Laterality: N/A;  105  . ESOPHAGOGASTRODUODENOSCOPY  01/21/16    gastritis/reactive gastropathy.  H pylori neg.  Bx benign.  . ESOPHAGOGASTRODUODENOSCOPY N/A 01/21/2016   Procedure: ESOPHAGOGASTRODUODENOSCOPY (EGD);  Surgeon: Rogene Houston, MD;  [facility];  Service: Endoscopy;  Laterality: N/A;  1:10 - moved to 5/26 @ 8:25  . ESOPHAGOGASTRODUODENOSCOPY  10/13/2016   Benign gastric mucosa.  Chronic inactive gastritis.  H pylori NEG (Dr. Laural Golden)  . ESOPHAGOGASTRODUODENOSCOPY (EGD) WITH PROPOFOL N/A 10/13/2016   Procedure: ESOPHAGOGASTRODUODENOSCOPY (EGD) WITH PROPOFOL;  Surgeon: Rogene Houston, MD;  [facility];  Service: Endoscopy;  Laterality: N/A;  9:15  . ESOPHAGOGASTRODUODENOSCOPY (EGD) WITH PROPOFOL N/A 04/05/2018   Procedure: ESOPHAGOGASTRODUODENOSCOPY (EGD) WITH PROPOFOL;  Surgeon: Rogene Houston, MD;  [facility];  Service: Endoscopy;  Laterality: N/A;  8:55 - per office - pt unable to come earlier  . HIATAL HERNIA REPAIR  09/2007   He underwent repair of large hiatal hernia and pyloroplasty which was complicated by pyloroplasty leak and subsequent gastrojejunostomy  . NISSEN FUNDOPLICATION     x2  . NOCTURNAL POLYSOMNOGRAM  05/2013   Severe periodic limb movement d/o: trial of dopamine agonist such as requip was recommended.  Marland Kitchen PYLOROPLASTY  2009  .  TRANSTHORACIC ECHOCARDIOGRAM  12/2013; 12/2016   2015: EF 65-70%, mild LVH, grade I DD, no valvular problems.  2018: EF 55-60%, no wall motion abnormalities, valves normal except aortic leaflets and annulus calcifications w/out any stenosis or regurg.    MEDS:  Outpatient Medications Prior to Visit  Medication Sig Dispense Refill  . FLUoxetine (PROZAC) 40 MG capsule Take 1 capsule (40 mg total) by mouth daily. OFFICE VISIT NEEDED FOR MORE REFILLS. 30 capsule 0  . gabapentin (NEURONTIN) 300 MG capsule 1 cap po morning, noon, and evening, and 2 caps po qhs 150 capsule 6  . loperamide (IMODIUM A-D) 2 MG tablet Take 1 tablet (2 mg total) by mouth 3 (three) times daily  before meals. 30 tablet 0  . loratadine (CLARITIN) 10 MG tablet Take 10 mg by mouth daily as needed for allergies.     Marland Kitchen LORazepam (ATIVAN) 1 MG tablet Take 1 tablet (1 mg total) by mouth 2 (two) times daily. 60 tablet 0  . Multiple Vitamin (MULTIVITAMIN WITH MINERALS) TABS tablet Take 1 tablet by mouth daily.    . nadolol (CORGARD) 20 MG tablet Take 1 tablet (20 mg total) by mouth daily. 30 tablet 11  . omeprazole (PRILOSEC) 20 MG capsule TAKE 1 CAPSULE (20 MG TOTAL) BY MOUTH DAILY. 30 capsule 0  . ondansetron (ZOFRAN) 4 MG tablet TAKE ONE TABLET BY MOUTH TWICE DAILY AS NEEDED FOR NAUSEA OR VOMITING. DO NOT TAKE DAILY. 30 tablet 0  . traZODone (DESYREL) 50 MG tablet TAKE 1-2 TABLETS BY MOUTH AT BEDTIME FOR INSOMNIA 60 tablet 6  . vitamin A 8000 UNIT capsule Take 1 capsule (8,000 Units total) by mouth daily. 90 capsule 0   No facility-administered medications prior to visit.     Pertinent labs/imaging ***  ASSESSMENT/PLAN:  ***  {Medical decision making of moderate complexity was utilized today} 99495  {Medical decision making of high complexity was utilized today} 99496  FOLLOW UP:  ***

## 2018-10-23 ENCOUNTER — Inpatient Hospital Stay: Payer: Self-pay | Admitting: Family Medicine

## 2018-11-01 ENCOUNTER — Encounter: Payer: Self-pay | Admitting: Family Medicine

## 2018-11-01 ENCOUNTER — Ambulatory Visit (INDEPENDENT_AMBULATORY_CARE_PROVIDER_SITE_OTHER): Payer: Medicare Other | Admitting: Family Medicine

## 2018-11-01 VITALS — BP 126/78 | HR 55 | Temp 97.5°F | Resp 16 | Ht 72.0 in | Wt 165.1 lb

## 2018-11-01 DIAGNOSIS — Z9889 Other specified postprocedural states: Secondary | ICD-10-CM

## 2018-11-01 DIAGNOSIS — K567 Ileus, unspecified: Secondary | ICD-10-CM

## 2018-11-01 DIAGNOSIS — D696 Thrombocytopenia, unspecified: Secondary | ICD-10-CM

## 2018-11-01 DIAGNOSIS — F419 Anxiety disorder, unspecified: Secondary | ICD-10-CM

## 2018-11-01 DIAGNOSIS — K222 Esophageal obstruction: Secondary | ICD-10-CM | POA: Diagnosis not present

## 2018-11-01 DIAGNOSIS — G4700 Insomnia, unspecified: Secondary | ICD-10-CM

## 2018-11-01 DIAGNOSIS — K7030 Alcoholic cirrhosis of liver without ascites: Secondary | ICD-10-CM | POA: Diagnosis not present

## 2018-11-01 DIAGNOSIS — K529 Noninfective gastroenteritis and colitis, unspecified: Secondary | ICD-10-CM

## 2018-11-01 DIAGNOSIS — F1020 Alcohol dependence, uncomplicated: Secondary | ICD-10-CM

## 2018-11-01 MED ORDER — TRAZODONE HCL 50 MG PO TABS: ORAL_TABLET | ORAL | 6 refills | Status: AC

## 2018-11-01 NOTE — Progress Notes (Signed)
OFFICE VISIT  11/01/2018   CC:  Chief Complaint  Patient presents with  . Hospitalization Follow-up     HPI:    Patient is a 68 y.o. Caucasian male who presents for hospital f/u: Was admitted to [facility] med center 211-2/17, 2020. D/c home 17 days ago.  Reviewed entire hosp record today with pt. Admitted for n/v/abd pain and dehydration.  Dx'd with ileus, improved with NPO and gradual advancement of diet. Chronic thrombocytopenia and low wbc count in hosp->stable.  Mild AKI resolved with IVF.   He was not d/cd home on any new meds.  Since d/c home he had been doing well, then a few days ago he started getting some significant dysphagia and nausea.  He occ has small amount of vomit.  Has his usual loose stools alt with some formed stools--takes imodium tid.  No fevers or abd pain.  Tolerating soft solid and liquids.  +passing gas, burping a lot as well.   No cough, SOB, or chest pain.  No melena.  Has chronic insomnia, asks for RF of his trazodone. Chronic anxiety: he is back to self treating with 4 beers a day.  Is out of his lorazepam that Dr. Laural Golden rx's for him.    Review of Systems  Constitutional: Positive for fatigue (chronic). Negative for fever.  HENT: Negative for congestion, nosebleeds and sore throat.   Eyes: Negative for visual disturbance.  Respiratory: Negative for cough, choking and shortness of breath.   Cardiovascular: Negative for chest pain.  Gastrointestinal: Negative for abdominal pain, blood in stool and nausea.  Genitourinary: Negative for dysuria.  Musculoskeletal: Negative for back pain and joint swelling.  Skin: Negative for rash.  Neurological: Negative for weakness and headaches.  Hematological: Negative for adenopathy.  Psychiatric/Behavioral: Positive for sleep disturbance. Negative for dysphoric mood. The patient is nervous/anxious.       Past Medical History:  Diagnosis Date  . Alcoholic cirrhosis ([place])    with portal hypertensive  gastropathy, grd I esophageal varices (03/2018 EGD), +thrombocytopenia  . Alcoholic hepatitis   . Alcoholic pancreatitis   . Alcoholism ([place])    Still drinking as of 09/2016  . Anxiety and depression   . Arthritis   . Aspiration pneumonitis (HCC)    recurrent  . Bile reflux esophagitis    Chronic; due to gastrojejunostomy.  He was evaluated by Dr. Demetrio Lapping of [facility] last year for Roux-en-Y procedure. He decided not to proceed with surgery because of high risk. Metoclopramide resulted in diarrhea and dominant and domperodone was ineffective.   . Chest pain 09/2017   ? stable angina, + equivocal lexiscan as outpt---referred to cardiologist 10/30/17.  Marland Kitchen Chronic back pain   . Chronic diarrhea    multifactorial-->improved with scheduled imodium as of 2019  . Crohn disease ([place])    in remission since approx 1995  . DDD (degenerative disc disease), cervical 02/2013; 01/2018   2014-MRI by Dr. Joneen Caraway.  Concern for progression of multilevel dz/chronic neck pain, failed conservative measures: has been offered C4-C7 ACDF-->pt considering as of 02/14/18  . DDD (degenerative disc disease), lumbar    L > R lumbar radiculopathy  . Depression   . Dumping syndrome   . Encephalomalacia on imaging study 02/2013   MRI brain w/o contrast: Mild progression of left occipital lobe encephalomalacia and  . Essential hypertension, benign   . Fatty liver 02/2013; 03/2016; 03/2017   Abd u/s.  Also noted on CT's of abdomen.  No change on 03/2017 u/s except  mild ascites noted.  . Gastroparesis   . GERD (gastroesophageal reflux disease)   . Heart murmur 2018   ECHO: normal except calcified aortic annulus and leaflets, w/out any stenosis or compromise of valve function.  Marland Kitchen History of esophageal stricture    Recurrent--at GE junction.  Has required multiple dilation procedures (most recent 03/2018).  Esophagrams have shown normal swallowing function.  Marland Kitchen History of kidney stones   . History of liver failure    (alchoholic)  Recovered in hosp  . Hypogonadism male    Androgel 1.62%, 2 pumps qd started 02/05/17  . Iron deficiency anemia    Malabsorption:  feraheme infusion 04/24/17 [facility].  Hb normal and iron completely repleted as of 09/2017.  . Malabsorption syndrome    Question pancreatic exocrine insufficiency, but abnorption improved signif after pt stopped alcohol for 6-9 mo (iron/hb, vitamin levels improved)  . Migraine headache   . OSA (obstructive sleep apnea)    cannot tolerate CPAP  . Paraesophageal hernia   . Portal hypertensive gastropathy (HCC) 2019   nadolol  . SOB (shortness of breath)   . Stroke [facility])    short term memory loss.  . Subdural hematoma ([place])   . TIA (transient ischemic attack)   . Vitamin deficiency 2018   Vit A and Vit E (secondary to malabsorption and mild malnutrition)    Past Surgical History:  Procedure Laterality Date  . BILROTH II PROCEDURE     with gastrojejunostomy.  *Loss of duodenal continuity--malabsorption, esp of fat soluble vitamins and iron, may occur.  Marland Kitchen CARDIOVASCULAR STRESS TEST  01/30/14; 09/2017   2015 Myocardial perfusion imaging: NORMAL ([name]).  09/2017 lexiscan low risk but with equivocal area in apex that could represent a small area of ischemia.  Referred pt to cardiology.  . CHOLECYSTECTOMY  05/2016   Open.    . COLON RESECTION Right    right colon resection and ileocolonic anastamosis  . COLONOSCOPY N/A 07/02/2013   Procedure: COLONOSCOPY;  Surgeon: Rogene Houston, MD;  [facility];  Service: Endoscopy;  Laterality: N/A;  200  . ESOPHAGEAL DILATION N/A 10/30/2014   Procedure: ESOPHAGEAL DILATION;  Surgeon: Rogene Houston, MD;  [facility];  Service: Endoscopy;  Laterality: N/A;  . ESOPHAGEAL DILATION N/A 02/12/2015   Procedure: ESOPHAGEAL DILATION;  Surgeon: Rogene Houston, MD;  [facility];  Service: Endoscopy;  Laterality: N/A;  . ESOPHAGEAL DILATION N/A 01/21/2016   Procedure: ESOPHAGEAL DILATION;  Surgeon:  Rogene Houston, MD;  [facility];  Service: Endoscopy;  Laterality: N/A;  . ESOPHAGEAL DILATION N/A 10/13/2016   H pylori neg.  Esophagitis, esoph stricture, and portal hypertensive gastropathy noted.  Procedure: ESOPHAGEAL DILATION;  Surgeon: Rogene Houston, MD;  [facility];  Service: Endoscopy;  Laterality: N/A;  . ESOPHAGEAL DILATION N/A 04/05/2018   Procedure: ESOPHAGEAL DILATION;  Surgeon: Rogene Houston, MD;  [facility];  Service: Endoscopy;  Laterality: N/A;  . ESOPHAGOGASTRODUODENOSCOPY N/A 08/14/2013   Procedure: ESOPHAGOGASTRODUODENOSCOPY (EGD);  Surgeon: Rogene Houston, MD;  [facility];  Service: Endoscopy;  Laterality: N/A;  300  . ESOPHAGOGASTRODUODENOSCOPY N/A 10/30/2014   Procedure: ESOPHAGOGASTRODUODENOSCOPY (EGD);  Surgeon: Rogene Houston, MD;  [facility];  Service: Endoscopy;  Laterality: N/A;  940  . ESOPHAGOGASTRODUODENOSCOPY N/A 02/12/2015   Procedure: ESOPHAGOGASTRODUODENOSCOPY (EGD);  Surgeon: Rogene Houston, MD;  [facility];  Service: Endoscopy;  Laterality: N/A;  105  .  ESOPHAGOGASTRODUODENOSCOPY  01/21/16   gastritis/reactive gastropathy.  H pylori neg.  Bx benign.  . ESOPHAGOGASTRODUODENOSCOPY N/A 01/21/2016   Procedure: ESOPHAGOGASTRODUODENOSCOPY (EGD);  Surgeon: Rogene Houston, MD;  [facility];  Service: Endoscopy;  Laterality: N/A;  1:10 - moved to 5/26 @ 8:25  . ESOPHAGOGASTRODUODENOSCOPY  10/13/2016   Benign gastric mucosa.  Chronic inactive gastritis.  H pylori NEG (Dr. Laural Golden)  . ESOPHAGOGASTRODUODENOSCOPY (EGD) WITH PROPOFOL N/A 10/13/2016   Procedure: ESOPHAGOGASTRODUODENOSCOPY (EGD) WITH PROPOFOL;  Surgeon: Rogene Houston, MD;  [facility];  Service: Endoscopy;  Laterality: N/A;  9:15  . ESOPHAGOGASTRODUODENOSCOPY (EGD) WITH PROPOFOL N/A 04/05/2018   Procedure: ESOPHAGOGASTRODUODENOSCOPY (EGD) WITH PROPOFOL;  Surgeon: Rogene Houston, MD;  [facility];  Service: Endoscopy;  Laterality: N/A;  8:55 - per office - pt unable to come earlier  . HIATAL HERNIA REPAIR  09/2007   He underwent repair of large hiatal hernia and pyloroplasty which was complicated by pyloroplasty leak and subsequent gastrojejunostomy  . NISSEN FUNDOPLICATION     x2  . NOCTURNAL POLYSOMNOGRAM  05/2013   Severe periodic limb movement d/o: trial of dopamine agonist such as requip was recommended.  Marland Kitchen PYLOROPLASTY  2009  . TRANSTHORACIC ECHOCARDIOGRAM  12/2013; 12/2016   2015: EF 65-70%, mild LVH, grade I DD, no valvular problems.  2018: EF 55-60%, no wall motion abnormalities, valves normal except aortic leaflets and annulus calcifications w/out any stenosis or regurg.    Outpatient Medications Prior to Visit  Medication Sig Dispense Refill  . FLUoxetine (PROZAC) 40 MG capsule Take 1 capsule (40 mg total) by mouth daily. OFFICE VISIT NEEDED FOR MORE REFILLS. 30 capsule 0  . gabapentin (NEURONTIN) 300 MG capsule 1 cap po morning, noon, and evening, and 2 caps po qhs 150 capsule 6  . loperamide (IMODIUM A-D) 2 MG tablet Take 1 tablet (2 mg total) by mouth 3 (three) times daily before meals. 30 tablet 0  . loratadine (CLARITIN) 10 MG tablet Take 10 mg by mouth daily as needed for allergies.     . Multiple Vitamin (MULTIVITAMIN WITH MINERALS) TABS tablet Take 1 tablet by mouth daily.    . nadolol (CORGARD) 20 MG tablet Take 1 tablet (20 mg total) by mouth daily. 30 tablet 11  . omeprazole (PRILOSEC) 20 MG capsule TAKE 1 CAPSULE (20 MG TOTAL) BY MOUTH DAILY. 30 capsule 0  . ondansetron (ZOFRAN) 4 MG tablet TAKE ONE TABLET BY MOUTH TWICE DAILY AS NEEDED FOR NAUSEA OR VOMITING. DO NOT TAKE DAILY. 30 tablet 0  . traZODone (DESYREL) 50 MG tablet TAKE 1-2 TABLETS BY MOUTH AT BEDTIME FOR INSOMNIA 60 tablet 6  . vitamin A 8000 UNIT capsule Take 1 capsule (8,000 Units total) by mouth daily. 90 capsule 0  . LORazepam (ATIVAN) 1 MG tablet Take 1 tablet (1 mg total) by mouth 2 (two)  times daily. (Patient not taking: Reported on 11/01/2018) 60 tablet 0   No facility-administered medications prior to visit.     Allergies  Allergen Reactions  . Meloxicam Other (See Comments)    Per daughter   . Tramadol Other (See Comments)    Unknown  . Acetaminophen Other (See Comments)    Liver issues  . Norvasc [Amlodipine] Other (See Comments)    "felt bad"    ROS As per HPI  PE: Blood pressure 126/78, pulse (!) 55, temperature (!) 97.5 F (36.4 C), temperature source Oral, resp. rate 16, height 6' (1.829 m), weight 165 lb  2 oz (74.9 kg), SpO2 100 %. Body mass index is 22.39 kg/m.  Gen: Alert, well appearing.  Patient is oriented to person, place, time, and situation. AFFECT: pleasant, lucid thought and speech. HFW:YOVZ: no injection, icteris, swelling, or exudate.  EOMI, PERRLA. Mouth: lips without lesion/swelling.  Oral mucosa pink and moist. Oropharynx without erythema, exudate, or swelling.  Neck - No masses or thyromegaly or limitation in range of motion CV: RRR, soft systolic murmur , no r/g.   LUNGS: CTA bilat, nonlabored resps, good aeration in all lung fields. ABD: soft, NT, ND, BS alternating decreased with periods of increased BS with rushing high pitched sounds periodically..  No hepatospenomegaly or mass.  No bruits. EXT: no clubbing or cyanosis.  no edema.    LABS:    Chemistry      Component Value Date/Time   NA 132 (L) 03/28/2018 1415   NA 137 11/07/2016   K 3.9 03/28/2018 1415   CL 98 03/28/2018 1415   CO2 21 (L) 03/28/2018 1415   BUN 6 (L) 03/28/2018 1415   BUN 16 11/07/2016   CREATININE 1.06 03/28/2018 1415   CREATININE 0.83 02/09/2016 1431      Component Value Date/Time   CALCIUM 8.9 03/28/2018 1415   ALKPHOS 121 (H) 10/18/2017 1336   AST 41 (H) 10/18/2017 1336   ALT 24 10/18/2017 1336   BILITOT 2.0 (H) 10/18/2017 1336     Lab Results  Component Value Date   WBC 5.5 03/28/2018   HGB 14.3 03/28/2018   HCT 42.1 03/28/2018   MCV  97.0 03/28/2018   PLT 81 (L) 03/28/2018     IMPRESSION AND PLAN:  HOSP F/U:  1) Ileus, early/mild.  +Esoph stricture. Liquid diet, advance as tolerated.  He will be making a f/u appt with Dr. Laural Golden for consideration of EGD/dilation. Signs/symptoms to call or return for were reviewed and pt expressed understanding.  2) Chronic insomnia: RF'd trazodone.  3) Chronic diarrhea: multifactorial.  Stable on imodium.  4) Chronic anxiety: he self medicates with alcohol unfortunately.  Fluoxetine + lorazepam (lorazepam per Dr. Laural Golden).  Discouraged use of alcohol for medicating his anxiety.  5) Alcoholism with alcoholic cirrhosis (+portal hypertension w/varices and splenomegaly/thrombocytopenia). Encouraged complete abstinence.  He refuses to go to [facility] or a rehab center.   Follow CBC and CMET today.  6) Chronic pain syndrome (low back pain): pt managed by Pain Specialist.  An After Visit Summary was printed and given to the patient.  FOLLOW UP: 4 mo  Signed:  Crissie Sickles, MD           11/01/2018

## 2018-11-06 ENCOUNTER — Encounter (INDEPENDENT_AMBULATORY_CARE_PROVIDER_SITE_OTHER): Payer: Self-pay | Admitting: Internal Medicine

## 2018-11-06 ENCOUNTER — Other Ambulatory Visit: Payer: Self-pay

## 2018-11-06 ENCOUNTER — Ambulatory Visit (INDEPENDENT_AMBULATORY_CARE_PROVIDER_SITE_OTHER): Payer: Medicare Other | Admitting: Internal Medicine

## 2018-11-06 VITALS — BP 100/67 | HR 76 | Temp 97.6°F | Ht 72.0 in | Wt 161.8 lb

## 2018-11-06 DIAGNOSIS — E860 Dehydration: Secondary | ICD-10-CM

## 2018-11-06 DIAGNOSIS — K567 Ileus, unspecified: Secondary | ICD-10-CM | POA: Diagnosis not present

## 2018-11-06 LAB — CBC WITH DIFFERENTIAL/PLATELET
Absolute Monocytes: 320 cells/uL (ref 200–950)
Basophils Absolute: 61 cells/uL (ref 0–200)
Basophils Relative: 1.7 %
EOS ABS: 90 {cells}/uL (ref 15–500)
Eosinophils Relative: 2.5 %
HCT: 35.4 % — ABNORMAL LOW (ref 38.5–50.0)
Hemoglobin: 12.5 g/dL — ABNORMAL LOW (ref 13.2–17.1)
Lymphs Abs: 2020 cells/uL (ref 850–3900)
MCH: 33.5 pg — ABNORMAL HIGH (ref 27.0–33.0)
MCHC: 35.3 g/dL (ref 32.0–36.0)
MCV: 94.9 fL (ref 80.0–100.0)
MPV: 12.8 fL — ABNORMAL HIGH (ref 7.5–12.5)
Monocytes Relative: 8.9 %
Neutro Abs: 1109 cells/uL — ABNORMAL LOW (ref 1500–7800)
Neutrophils Relative %: 30.8 %
Platelets: 73 10*3/uL — ABNORMAL LOW (ref 140–400)
RBC: 3.73 10*6/uL — ABNORMAL LOW (ref 4.20–5.80)
RDW: 13.4 % (ref 11.0–15.0)
Total Lymphocyte: 56.1 %
WBC: 3.6 10*3/uL — ABNORMAL LOW (ref 3.8–10.8)

## 2018-11-06 LAB — COMPREHENSIVE METABOLIC PANEL
AG RATIO: 0.8 (calc) — AB (ref 1.0–2.5)
ALKALINE PHOSPHATASE (APISO): 201 U/L — AB (ref 35–144)
ALT: 57 U/L — ABNORMAL HIGH (ref 9–46)
AST: 124 U/L — ABNORMAL HIGH (ref 10–35)
Albumin: 3.2 g/dL — ABNORMAL LOW (ref 3.6–5.1)
BUN/Creatinine Ratio: 5 (calc) — ABNORMAL LOW (ref 6–22)
BUN: 4 mg/dL — ABNORMAL LOW (ref 7–25)
CO2: 22 mmol/L (ref 20–32)
Calcium: 8.9 mg/dL (ref 8.6–10.3)
Chloride: 102 mmol/L (ref 98–110)
Creat: 0.84 mg/dL (ref 0.70–1.25)
Globulin: 4 g/dL (calc) — ABNORMAL HIGH (ref 1.9–3.7)
Glucose, Bld: 104 mg/dL (ref 65–139)
Potassium: 4.3 mmol/L (ref 3.5–5.3)
Sodium: 135 mmol/L (ref 135–146)
Total Bilirubin: 2.9 mg/dL — ABNORMAL HIGH (ref 0.2–1.2)
Total Protein: 7.2 g/dL (ref 6.1–8.1)

## 2018-11-06 NOTE — Progress Notes (Signed)
Subjective:    Patient ID: Gary Vasquez, male    DOB: [birth year], 68 y.o.   MRN: [id number]  HPI Here today for f/u.   Gary Vasquez admitted to [facility] with ileus 09/2018. . Improved with NPO and gradual advancement of diet.  He did develop diarrhea prior to discharge but would not wait for the results of his test. States today he does feel some dizziness. States he is not eating, He has no appetite. No dysphagia.   His BMs are moving okay. He says he has hard time sleeping. On 3/6 he states ( in record) he was drinking 4 beers a day. He says he is not drinking that much.  No melena or BRRB.  Review of Systems Past Medical History:  Diagnosis Date   Alcoholic cirrhosis ([place])    with portal hypertensive gastropathy, grd I esophageal varices (03/2018 EGD), +thrombocytopenia   Alcoholic hepatitis    Alcoholic pancreatitis    Alcoholism ([place])    Still drinking as of 09/2016   Anxiety and depression    Arthritis    Aspiration pneumonitis (HCC)    recurrent   Bile reflux esophagitis    Chronic; due to gastrojejunostomy.  He was evaluated by Dr. Demetrio Lapping of [facility] last year for Roux-en-Y procedure. He decided not to proceed with surgery because of high risk. Metoclopramide resulted in diarrhea and dominant and domperodone was ineffective.    Chest pain 09/2017   ? stable angina, + equivocal lexiscan as outpt---referred to cardiologist 10/30/17.   Chronic back pain    Chronic diarrhea    multifactorial-->improved with scheduled imodium as of 2019   Crohn disease ([place])    in remission since approx 1995   DDD (degenerative disc disease), cervical 02/2013; 01/2018   2014-MRI by Dr. Joneen Caraway.  Concern for progression of multilevel dz/chronic neck pain, failed conservative measures: has been offered C4-C7 ACDF-->pt considering as of 02/14/18   DDD (degenerative disc disease), lumbar    L > R lumbar radiculopathy   Depression    Dumping syndrome    Encephalomalacia on imaging study  02/2013   MRI brain w/o contrast: Mild progression of left occipital lobe encephalomalacia and   Essential hypertension, benign    Fatty liver 02/2013; 03/2016; 03/2017   Abd u/s.  Also noted on CT's of abdomen.  No change on 03/2017 u/s except mild ascites noted.   Gastroparesis    GERD (gastroesophageal reflux disease)    Heart murmur 2018   ECHO: normal except calcified aortic annulus and leaflets, w/out any stenosis or compromise of valve function.   History of esophageal stricture    Recurrent--at GE junction.  Has required multiple dilation procedures (most recent 03/2018).  Esophagrams have shown normal swallowing function.   History of kidney stones    History of liver failure    (alchoholic) Recovered in hosp   Hypogonadism male    Androgel 1.62%, 2 pumps qd started 02/05/17   Iron deficiency anemia    Malabsorption:  feraheme infusion 04/24/17 [facility].  Hb normal and iron completely repleted as of 09/2017.   Malabsorption syndrome    Question pancreatic exocrine insufficiency, but abnorption improved signif after pt stopped alcohol for 6-9 mo (iron/hb, vitamin levels improved)   Migraine headache    OSA (obstructive sleep apnea)    cannot tolerate CPAP   Paraesophageal hernia    Portal hypertensive gastropathy ([place]) 2019   nadolol   SOB (shortness of breath)    Stroke ([place])  short term memory loss.   Subdural hematoma (HCC)    TIA (transient ischemic attack)    Vitamin deficiency 2018   Vit A and Vit E (secondary to malabsorption and mild malnutrition)    Past Surgical History:  Procedure Laterality Date   BILROTH II PROCEDURE     with gastrojejunostomy.  *Loss of duodenal continuity--malabsorption, esp of fat soluble vitamins and iron, may occur.   CARDIOVASCULAR STRESS TEST  01/30/14; 09/2017   2015 Myocardial perfusion imaging: NORMAL ([name]).  09/2017 lexiscan low risk but with equivocal area in apex that could represent a small area of ischemia.   Referred pt to cardiology.   CHOLECYSTECTOMY  05/2016   Open.     COLON RESECTION Right    right colon resection and ileocolonic anastamosis   COLONOSCOPY N/A 07/02/2013   Procedure: COLONOSCOPY;  Surgeon: Rogene Houston, MD;  [facility];  Service: Endoscopy;  Laterality: N/A;  200   ESOPHAGEAL DILATION N/A 10/30/2014   Procedure: ESOPHAGEAL DILATION;  Surgeon: Rogene Houston, MD;  [facility];  Service: Endoscopy;  Laterality: N/A;   ESOPHAGEAL DILATION N/A 02/12/2015   Procedure: ESOPHAGEAL DILATION;  Surgeon: Rogene Houston, MD;  [facility];  Service: Endoscopy;  Laterality: N/A;   ESOPHAGEAL DILATION N/A 01/21/2016   Procedure: ESOPHAGEAL DILATION;  Surgeon: Rogene Houston, MD;  [facility];  Service: Endoscopy;  Laterality: N/A;   ESOPHAGEAL DILATION N/A 10/13/2016   H pylori neg.  Esophagitis, esoph stricture, and portal hypertensive gastropathy noted.  Procedure: ESOPHAGEAL DILATION;  Surgeon: Rogene Houston, MD;  [facility];  Service: Endoscopy;  Laterality: N/A;   ESOPHAGEAL DILATION N/A 04/05/2018   Procedure: ESOPHAGEAL DILATION;  Surgeon: Rogene Houston, MD;  [facility];  Service: Endoscopy;  Laterality: N/A;   ESOPHAGOGASTRODUODENOSCOPY N/A 08/14/2013   Procedure: ESOPHAGOGASTRODUODENOSCOPY (EGD);  Surgeon: Rogene Houston, MD;  [facility];  Service: Endoscopy;  Laterality: N/A;  300   ESOPHAGOGASTRODUODENOSCOPY N/A 10/30/2014   Procedure: ESOPHAGOGASTRODUODENOSCOPY (EGD);  Surgeon: Rogene Houston, MD;  [facility];  Service: Endoscopy;  Laterality: N/A;  940   ESOPHAGOGASTRODUODENOSCOPY N/A 02/12/2015   Procedure: ESOPHAGOGASTRODUODENOSCOPY (EGD);  Surgeon: Rogene Houston, MD;  [facility];  Service: Endoscopy;  Laterality: N/A;  105   ESOPHAGOGASTRODUODENOSCOPY  01/21/16   gastritis/reactive gastropathy.  H pylori neg.  Bx benign.    ESOPHAGOGASTRODUODENOSCOPY N/A 01/21/2016   Procedure: ESOPHAGOGASTRODUODENOSCOPY (EGD);  Surgeon: Rogene Houston, MD;  [facility];  Service: Endoscopy;  Laterality: N/A;  1:10 - moved to 5/26 @ 8:25   ESOPHAGOGASTRODUODENOSCOPY  10/13/2016   Benign gastric mucosa.  Chronic inactive gastritis.  H pylori NEG (Dr. Laural Golden)   ESOPHAGOGASTRODUODENOSCOPY (EGD) WITH PROPOFOL N/A 10/13/2016   Procedure: ESOPHAGOGASTRODUODENOSCOPY (EGD) WITH PROPOFOL;  Surgeon: Rogene Houston, MD;  [facility];  Service: Endoscopy;  Laterality: N/A;  9:15   ESOPHAGOGASTRODUODENOSCOPY (EGD) WITH PROPOFOL N/A 04/05/2018   Procedure: ESOPHAGOGASTRODUODENOSCOPY (EGD) WITH PROPOFOL;  Surgeon: Rogene Houston, MD;  [facility];  Service: Endoscopy;  Laterality: N/A;  8:55 - per office - pt unable to come earlier   [place]  09/2007   He underwent repair of large hiatal hernia and pyloroplasty which was complicated by pyloroplasty leak and subsequent gastrojejunostomy   NISSEN FUNDOPLICATION     x2   NOCTURNAL POLYSOMNOGRAM  05/2013   Severe periodic limb  movement d/o: trial of dopamine agonist such as requip was recommended.   PYLOROPLASTY  2009   TRANSTHORACIC ECHOCARDIOGRAM  12/2013; 12/2016   2015: EF 65-70%, mild LVH, grade I DD, no valvular problems.  2018: EF 55-60%, no wall motion abnormalities, valves normal except aortic leaflets and annulus calcifications w/out any stenosis or regurg.    Allergies  Allergen Reactions   Meloxicam Other (See Comments)    Per daughter    Tramadol Other (See Comments)    Unknown   Acetaminophen Other (See Comments)    Liver issues   Norvasc [Amlodipine] Other (See Comments)    "felt bad"    Current Outpatient Medications on File Prior to Visit  Medication Sig Dispense Refill   FLUoxetine (PROZAC) 40 MG capsule Take 1 capsule (40 mg total) by mouth daily. OFFICE VISIT NEEDED FOR MORE REFILLS. 30 capsule 0   gabapentin  (NEURONTIN) 300 MG capsule 1 cap po morning, noon, and evening, and 2 caps po qhs 150 capsule 6   loperamide (IMODIUM A-D) 2 MG tablet Take 1 tablet (2 mg total) by mouth 3 (three) times daily before meals. 30 tablet 0   loratadine (CLARITIN) 10 MG tablet Take 10 mg by mouth daily as needed for allergies.      Multiple Vitamin (MULTIVITAMIN WITH MINERALS) TABS tablet Take 1 tablet by mouth daily.     nadolol (CORGARD) 20 MG tablet Take 1 tablet (20 mg total) by mouth daily. 30 tablet 11   omeprazole (PRILOSEC) 20 MG capsule TAKE 1 CAPSULE (20 MG TOTAL) BY MOUTH DAILY. 30 capsule 0   ondansetron (ZOFRAN) 4 MG tablet TAKE ONE TABLET BY MOUTH TWICE DAILY AS NEEDED FOR NAUSEA OR VOMITING. DO NOT TAKE DAILY. 30 tablet 0   traZODone (DESYREL) 50 MG tablet TAKE 1-2 TABLETS BY MOUTH AT BEDTIME FOR INSOMNIA 60 tablet 6   vitamin A 8000 UNIT capsule Take 1 capsule (8,000 Units total) by mouth daily. 90 capsule 0   LORazepam (ATIVAN) 1 MG tablet Take 1 tablet (1 mg total) by mouth 2 (two) times daily. (Patient not taking: Reported on 11/01/2018) 60 tablet 0   No current facility-administered medications on file prior to visit.         Objective:   Physical Exam Blood pressure 100/67, pulse 76, temperature 97.6 F (36.4 C), height 6' (1.829 m), weight 161 lb 12.8 oz (73.4 kg). Alert and oriented. Skin warm and dry. Oral mucosa is moist.   . Sclera anicteric, conjunctivae is pink. Thyroid not enlarged. No cervical lymphadenopathy. Lungs clear. Heart regular rate and rhythm.  Abdomen is soft. Bowel sounds are positive. No hepatomegaly. No abdominal masses felt. No tenderness.  No edema to lower extremities.        Assessment & Plan:  Ileus. Bowel are moving okay. Passing gas normal.  Dehydration: Will get a CBC and CMET Am not going to refill his Ativan. Patient is drinking again.  Further recommendations to follow.

## 2018-11-06 NOTE — Patient Instructions (Signed)
CBC and cmet.

## 2018-11-07 ENCOUNTER — Other Ambulatory Visit (INDEPENDENT_AMBULATORY_CARE_PROVIDER_SITE_OTHER): Payer: Self-pay | Admitting: *Deleted

## 2018-11-07 ENCOUNTER — Other Ambulatory Visit: Payer: Self-pay | Admitting: Family Medicine

## 2018-11-07 DIAGNOSIS — Z8719 Personal history of other diseases of the digestive system: Secondary | ICD-10-CM

## 2018-11-07 NOTE — Telephone Encounter (Signed)
RF request for gabapentin LOV: 11/01/18 - HFU Next ov: None Last written: 07/04/17 #150 w/ 6RF  RF request for fluoxetine Last written: 07/30/18 #30 w/ 0RF  Please advise. Thanks.

## 2018-11-14 ENCOUNTER — Encounter (INDEPENDENT_AMBULATORY_CARE_PROVIDER_SITE_OTHER): Payer: Self-pay | Admitting: *Deleted

## 2018-11-14 ENCOUNTER — Other Ambulatory Visit (INDEPENDENT_AMBULATORY_CARE_PROVIDER_SITE_OTHER): Payer: Self-pay | Admitting: *Deleted

## 2018-11-14 DIAGNOSIS — Z8719 Personal history of other diseases of the digestive system: Secondary | ICD-10-CM

## 2018-11-27 ENCOUNTER — Other Ambulatory Visit: Payer: Self-pay

## 2018-11-27 ENCOUNTER — Encounter (INDEPENDENT_AMBULATORY_CARE_PROVIDER_SITE_OTHER): Payer: Self-pay | Admitting: Internal Medicine

## 2018-11-27 ENCOUNTER — Ambulatory Visit (INDEPENDENT_AMBULATORY_CARE_PROVIDER_SITE_OTHER): Payer: Medicare Other | Admitting: Internal Medicine

## 2018-11-27 DIAGNOSIS — R1310 Dysphagia, unspecified: Secondary | ICD-10-CM | POA: Diagnosis not present

## 2018-11-27 DIAGNOSIS — R1319 Other dysphagia: Secondary | ICD-10-CM

## 2018-11-27 NOTE — Patient Instructions (Signed)
EGD/ED. With propofol.

## 2018-11-27 NOTE — Progress Notes (Addendum)
Subjective:    Patient ID: Gary Vasquez, male    DOB: 1950-11-14, 68 y.o.   MRN: [id number] Visit started at 220pm. Ended 232pm total time 12 minutes.  HPI Patient wants this OV with me.Patient is at home. I am in the office. This is a telephone OV due to COVID-19. Unable to do video.  States today he is having some dysphagia. He states his dysphagia started after our OV. He says cannot pork or anything that is dry. He has a hard time eating bread. He has to vomit the bolus back up. His last EGD/ED was in August of 2019.  Normal proximal esophagus and mid esophagus. LA Grade B esophagitis. Grade 1 esophageal varices. Benign appearing esophageal stenosis. Dilated. Patent billroth 11 gastrojejunostomy was found. Portal hypertensive gastropathy.  BMs are normal.  He says he has cut back on his drinking. He is drinking about 1-2 beers a day.  He says the last visit he had been taking a couple of tylenol a day and Advil a couple a day. He stopped both after our called him with his results.  Recently admitted to [facility] with an ileus in February. Improved with conservative treatment (NPO status and gradual advancement of diet). Denies any diarrhea, melena or BRRB.  CBC    Component Value Date/Time   WBC 3.6 (L) 11/06/2018 1048   RBC 3.73 (L) 11/06/2018 1048   HGB 12.5 (L) 11/06/2018 1048   HCT 35.4 (L) 11/06/2018 1048   PLT 73 (L) 11/06/2018 1048   MCV 94.9 11/06/2018 1048   MCH 33.5 (H) 11/06/2018 1048   MCHC 35.3 11/06/2018 1048   RDW 13.4 11/06/2018 1048   LYMPHSABS 2,020 11/06/2018 1048   MONOABS 0.6 03/28/2018 1415   EOSABS 90 11/06/2018 1048   BASOSABS 61 11/06/2018 1048   CMP Latest Ref Rng & Units 11/06/2018 03/28/2018 10/18/2017  Glucose 65 - 139 mg/dL 104 108(H) 109(H)  BUN 7 - 25 mg/dL 4(L) 6(L) 9  Creatinine 0.70 - 1.25 mg/dL 0.84 1.06 1.14  Sodium 135 - 146 mmol/L 135 132(L) 136  Potassium 3.5 - 5.3 mmol/L 4.3 3.9 4.0  Chloride 98 - 110 mmol/L 102 98 101  CO2 20 - 32 mmol/L 22  21(L) 25  Calcium 8.6 - 10.3 mg/dL 8.9 8.9 9.6  Total Protein 6.1 - 8.1 g/dL 7.2 - 8.1  Total Bilirubin 0.2 - 1.2 mg/dL 2.9(H) - 2.0(H)  Alkaline Phos 39 - 117 U/L - - 121(H)  AST 10 - 35 U/L 124(H) - 41(H)  ALT 9 - 46 U/L 57(H) - 24    Review of Systems Past Medical History:  Diagnosis Date  . Alcoholic cirrhosis ([place])    with portal hypertensive gastropathy, grd I esophageal varices (03/2018 EGD), +thrombocytopenia  . Alcoholic hepatitis   . Alcoholic pancreatitis   . Alcoholism ([place])    Still drinking as of 09/2016  . Anxiety and depression   . Arthritis   . Aspiration pneumonitis (H[facility])    recurrent  . Bile reflux esophagitis    Chronic; due to gastrojejunostomy.  He was evaluated by Dr. Demetrio Lapping of [facility] last year for Roux-en-Y procedure. He decided not to proceed with surgery because of high risk. Metoclopramide resulted in diarrhea and dominant and domperodone was ineffective.   . Chest pain 09/2017   ? stable angina, + equivocal lexiscan as outpt---referred to cardiologist 10/30/17.  Marland Kitchen Chronic back pain   . Chronic diarrhea    multifactorial-->improved with scheduled imodium as of  2019  . Crohn disease ([place])    in remission since approx 1995  . DDD (degenerative disc disease), cervical 02/2013; 01/2018   2014-MRI by Dr. Joneen Caraway.  Concern for progression of multilevel dz/chronic neck pain, failed conservative measures: has been offered C4-C7 ACDF-->pt considering as of 02/14/18  . DDD (degenerative disc disease), lumbar    L > R lumbar radiculopathy  . Depression   . Dumping syndrome   . Encephalomalacia on imaging study 02/2013   MRI brain w/o contrast: Mild progression of left occipital lobe encephalomalacia and  . Essential hypertension, benign   . Fatty liver 02/2013; 03/2016; 03/2017   Abd u/s.  Also noted on CT's of abdomen.  No change on 03/2017 u/s except mild ascites noted.  . Gastroparesis   . GERD (gastroesophageal reflux disease)   . Heart murmur 2018   ECHO:  normal except calcified aortic annulus and leaflets, w/out any stenosis or compromise of valve function.  Marland Kitchen History of esophageal stricture    Recurrent--at GE junction.  Has required multiple dilation procedures (most recent 03/2018).  Esophagrams have shown normal swallowing function.  Marland Kitchen History of kidney stones   . History of liver failure    (alchoholic) Recovered in hosp  . Hypogonadism male    Androgel 1.62%, 2 pumps qd started 02/05/17  . Iron deficiency anemia    Malabsorption:  feraheme infusion 04/24/17 [facility].  Hb normal and iron completely repleted as of 09/2017.  . Malabsorption syndrome    Question pancreatic exocrine insufficiency, but abnorption improved signif after pt stopped alcohol for 6-9 mo (iron/hb, vitamin levels improved)  . Migraine headache   . OSA (obstructive sleep apnea)    cannot tolerate CPAP  . Paraesophageal hernia   . Portal hypertensive gastropathy (H[facility]) 2019   nadolol  . SOB (shortness of breath)   . Stroke [facility])    short term memory loss.  . Subdural hematoma ([place])   . TIA (transient ischemic attack)   . Vitamin deficiency 2018   Vit A and Vit E (secondary to malabsorption and mild malnutrition)    Past Surgical History:  Procedure Laterality Date  . BILROTH II PROCEDURE     with gastrojejunostomy.  *Loss of duodenal continuity--malabsorption, esp of fat soluble vitamins and iron, may occur.  Marland Kitchen CARDIOVASCULAR STRESS TEST  01/30/14; 09/2017   2015 Myocardial perfusion imaging: NORMAL ([name]).  09/2017 lexiscan low risk but with equivocal area in apex that could represent a small area of ischemia.  Referred pt to cardiology.  . CHOLECYSTECTOMY  05/2016   Open.    . COLON RESECTION Right    right colon resection and ileocolonic anastamosis  . COLONOSCOPY N/A 07/02/2013   Procedure: COLONOSCOPY;  Surgeon: Rogene Houston, MD;  [facility];  Service: Endoscopy;  Laterality: N/A;  200  . ESOPHAGEAL DILATION N/A 10/30/2014   Procedure:  ESOPHAGEAL DILATION;  Surgeon: Rogene Houston, MD;  [facility];  Service: Endoscopy;  Laterality: N/A;  . ESOPHAGEAL DILATION N/A 02/12/2015   Procedure: ESOPHAGEAL DILATION;  Surgeon: Rogene Houston, MD;  [facility];  Service: Endoscopy;  Laterality: N/A;  . ESOPHAGEAL DILATION N/A 01/21/2016   Procedure: ESOPHAGEAL DILATION;  Surgeon: Rogene Houston, MD;  [facility];  Service: Endoscopy;  Laterality: N/A;  . ESOPHAGEAL DILATION N/A 10/13/2016   H pylori neg.  Esophagitis, esoph stricture, and portal hypertensive gastropathy noted.  Procedure: ESOPHAGEAL DILATION;  Surgeon: Rogene Houston, MD;  [facility];  Service: Endoscopy;  Laterality: N/A;  . ESOPHAGEAL DILATION N/A 04/05/2018   Procedure: ESOPHAGEAL DILATION;  Surgeon: Rogene Houston, MD;  [facility];  Service: Endoscopy;  Laterality: N/A;  . ESOPHAGOGASTRODUODENOSCOPY N/A 08/14/2013   Procedure: ESOPHAGOGASTRODUODENOSCOPY (EGD);  Surgeon: Rogene Houston, MD;  [facility];  Service: Endoscopy;  Laterality: N/A;  300  . ESOPHAGOGASTRODUODENOSCOPY N/A 10/30/2014   Procedure: ESOPHAGOGASTRODUODENOSCOPY (EGD);  Surgeon: Rogene Houston, MD;  [facility];  Service: Endoscopy;  Laterality: N/A;  940  . ESOPHAGOGASTRODUODENOSCOPY N/A 02/12/2015   Procedure: ESOPHAGOGASTRODUODENOSCOPY (EGD);  Surgeon: Rogene Houston, MD;  [facility];  Service: Endoscopy;  Laterality: N/A;  105  . ESOPHAGOGASTRODUODENOSCOPY  01/21/16   gastritis/reactive gastropathy.  H pylori neg.  Bx benign.  . ESOPHAGOGASTRODUODENOSCOPY N/A 01/21/2016   Procedure: ESOPHAGOGASTRODUODENOSCOPY (EGD);  Surgeon: Rogene Houston, MD;  [facility];  Service: Endoscopy;  Laterality: N/A;  1:10 - moved to 5/26 @ 8:25  . ESOPHAGOGASTRODUODENOSCOPY  10/13/2016   Benign gastric mucosa.  Chronic inactive gastritis.  H pylori NEG (Dr. Laural Golden)  . ESOPHAGOGASTRODUODENOSCOPY (EGD)  WITH PROPOFOL N/A 10/13/2016   Procedure: ESOPHAGOGASTRODUODENOSCOPY (EGD) WITH PROPOFOL;  Surgeon: Rogene Houston, MD;  [facility];  Service: Endoscopy;  Laterality: N/A;  9:15  . ESOPHAGOGASTRODUODENOSCOPY (EGD) WITH PROPOFOL N/A 04/05/2018   Procedure: ESOPHAGOGASTRODUODENOSCOPY (EGD) WITH PROPOFOL;  Surgeon: Rogene Houston, MD;  [facility];  Service: Endoscopy;  Laterality: N/A;  8:55 - per office - pt unable to come earlier  . HIATAL HERNIA REPAIR  09/2007   He underwent repair of large hiatal hernia and pyloroplasty which was complicated by pyloroplasty leak and subsequent gastrojejunostomy  . NISSEN FUNDOPLICATION     x2  . NOCTURNAL POLYSOMNOGRAM  05/2013   Severe periodic limb movement d/o: trial of dopamine agonist such as requip was recommended.  Marland Kitchen PYLOROPLASTY  2009  . TRANSTHORACIC ECHOCARDIOGRAM  12/2013; 12/2016   2015: EF 65-70%, mild LVH, grade I DD, no valvular problems.  2018: EF 55-60%, no wall motion abnormalities, valves normal except aortic leaflets and annulus calcifications w/out any stenosis or regurg.    Allergies  Allergen Reactions  . Meloxicam Other (See Comments)    Per daughter   . Tramadol Other (See Comments)    Unknown  . Acetaminophen Other (See Comments)    Liver issues  . Norvasc [Amlodipine] Other (See Comments)    "felt bad"    Current Outpatient Medications on File Prior to Visit  Medication Sig Dispense Refill  . FLUoxetine (PROZAC) 40 MG capsule TAKE 1 CAPSULE (40 MG TOTAL) BY MOUTH DAILY. OFFICE VISIT NEEDED FOR MORE REFILLS. 30 capsule 6  . gabapentin (NEURONTIN) 300 MG capsule TAKE 1 CAPSULE BY MOUTH IN THE MORNING , AT NOON, IN THE EVENING AND TAKE TWO CAPSULES BY MOUTH AT BEDTIME 150 capsule 5  . loperamide (IMODIUM A-D) 2 MG tablet Take 1 tablet (2 mg total) by mouth 3 (three) times daily before meals. 30 tablet 0  . loratadine (CLARITIN) 10 MG tablet Take 10 mg by mouth daily as needed for allergies.     .  Multiple Vitamin (MULTIVITAMIN WITH MINERALS) TABS tablet Take 1 tablet by mouth daily.    . nadolol (CORGARD) 20 MG tablet Take 1 tablet (20 mg total) by mouth daily. 30 tablet 11  . omeprazole (PRILOSEC) 20 MG capsule TAKE 1 CAPSULE (20 MG TOTAL) BY MOUTH  DAILY. 30 capsule 0  . ondansetron (ZOFRAN) 4 MG tablet TAKE ONE TABLET BY MOUTH TWICE DAILY AS NEEDED FOR NAUSEA OR VOMITING. DO NOT TAKE DAILY. 30 tablet 0  . traZODone (DESYREL) 50 MG tablet TAKE 1-2 TABLETS BY MOUTH AT BEDTIME FOR INSOMNIA 60 tablet 6  . vitamin A 8000 UNIT capsule Take 1 capsule (8,000 Units total) by mouth daily. 90 capsule 0   No current facility-administered medications on file prior to visit.         Objective:   Physical Exam  deferred      Assessment & Plan:  Dysphagia: Will need an EGD/ED with propofol.  Esophageal stricture needs to be ruled out.

## 2019-01-03 DIAGNOSIS — I509 Heart failure, unspecified: Secondary | ICD-10-CM | POA: Diagnosis not present

## 2019-01-03 DIAGNOSIS — D696 Thrombocytopenia, unspecified: Secondary | ICD-10-CM | POA: Diagnosis not present

## 2019-01-03 DIAGNOSIS — Z8249 Family history of ischemic heart disease and other diseases of the circulatory system: Secondary | ICD-10-CM | POA: Diagnosis not present

## 2019-01-03 DIAGNOSIS — R0602 Shortness of breath: Secondary | ICD-10-CM | POA: Diagnosis not present

## 2019-01-03 DIAGNOSIS — R079 Chest pain, unspecified: Secondary | ICD-10-CM | POA: Diagnosis not present

## 2019-01-03 DIAGNOSIS — R110 Nausea: Secondary | ICD-10-CM | POA: Diagnosis not present

## 2019-01-03 DIAGNOSIS — R0789 Other chest pain: Secondary | ICD-10-CM | POA: Diagnosis not present

## 2019-01-03 DIAGNOSIS — Z729 Problem related to lifestyle, unspecified: Secondary | ICD-10-CM | POA: Diagnosis not present

## 2019-01-03 DIAGNOSIS — D6959 Other secondary thrombocytopenia: Secondary | ICD-10-CM | POA: Diagnosis not present

## 2019-01-03 DIAGNOSIS — I2510 Atherosclerotic heart disease of native coronary artery without angina pectoris: Secondary | ICD-10-CM | POA: Diagnosis not present

## 2019-01-04 DIAGNOSIS — R079 Chest pain, unspecified: Secondary | ICD-10-CM | POA: Diagnosis not present

## 2019-01-04 DIAGNOSIS — D696 Thrombocytopenia, unspecified: Secondary | ICD-10-CM | POA: Diagnosis not present

## 2019-01-05 DIAGNOSIS — R600 Localized edema: Secondary | ICD-10-CM | POA: Diagnosis not present

## 2019-01-05 DIAGNOSIS — R079 Chest pain, unspecified: Secondary | ICD-10-CM | POA: Diagnosis not present

## 2019-01-05 DIAGNOSIS — D696 Thrombocytopenia, unspecified: Secondary | ICD-10-CM | POA: Diagnosis not present

## 2019-01-06 DIAGNOSIS — D696 Thrombocytopenia, unspecified: Secondary | ICD-10-CM | POA: Diagnosis not present

## 2019-01-06 DIAGNOSIS — R079 Chest pain, unspecified: Secondary | ICD-10-CM | POA: Diagnosis not present

## 2019-01-06 DIAGNOSIS — R0789 Other chest pain: Secondary | ICD-10-CM | POA: Diagnosis not present

## 2019-01-06 DIAGNOSIS — I208 Other forms of angina pectoris: Secondary | ICD-10-CM | POA: Diagnosis not present

## 2019-01-06 DIAGNOSIS — R600 Localized edema: Secondary | ICD-10-CM | POA: Diagnosis not present

## 2019-01-06 MED ORDER — GABAPENTIN 300 MG PO CAPS
300.00 | ORAL_CAPSULE | ORAL | Status: DC
Start: 2019-01-07 — End: 2019-01-06

## 2019-01-06 MED ORDER — ALUMINUM-MAGNESIUM-SIMETHICONE 200-200-20 MG/5ML PO SUSP
30.00 | ORAL | Status: DC
Start: ? — End: 2019-01-06

## 2019-01-06 MED ORDER — LORAZEPAM 1 MG PO TABS
2.00 | ORAL_TABLET | ORAL | Status: DC
Start: ? — End: 2019-01-06

## 2019-01-06 MED ORDER — PROPRANOLOL HCL 10 MG PO TABS
10.00 | ORAL_TABLET | ORAL | Status: DC
Start: 2019-01-06 — End: 2019-01-06

## 2019-01-06 MED ORDER — FOLIC ACID 1 MG PO TABS
1.00 | ORAL_TABLET | ORAL | Status: DC
Start: 2019-01-07 — End: 2019-01-06

## 2019-01-06 MED ORDER — ASPIRIN EC 325 MG PO TBEC
325.00 | DELAYED_RELEASE_TABLET | ORAL | Status: DC
Start: 2019-01-07 — End: 2019-01-06

## 2019-01-06 MED ORDER — POTASSIUM CHLORIDE CRYS ER 20 MEQ PO TBCR
20.00 | EXTENDED_RELEASE_TABLET | ORAL | Status: DC
Start: 2019-01-07 — End: 2019-01-06

## 2019-01-06 MED ORDER — FLUOXETINE HCL 40 MG PO CAPS
40.00 | ORAL_CAPSULE | ORAL | Status: DC
Start: 2019-01-07 — End: 2019-01-06

## 2019-01-06 MED ORDER — NITROGLYCERIN 0.4 MG SL SUBL
0.40 | SUBLINGUAL_TABLET | SUBLINGUAL | Status: DC
Start: ? — End: 2019-01-06

## 2019-01-06 MED ORDER — TRAZODONE HCL 50 MG PO TABS
50.00 | ORAL_TABLET | ORAL | Status: DC
Start: ? — End: 2019-01-06

## 2019-01-06 MED ORDER — LOPERAMIDE HCL 2 MG PO CAPS
2.00 | ORAL_CAPSULE | ORAL | Status: DC
Start: ? — End: 2019-01-06

## 2019-01-06 MED ORDER — GENERIC EXTERNAL MEDICATION
100.00 | Status: DC
Start: 2019-01-07 — End: 2019-01-06

## 2019-01-06 MED ORDER — FUROSEMIDE 20 MG PO TABS
20.00 | ORAL_TABLET | ORAL | Status: DC
Start: 2019-01-07 — End: 2019-01-06

## 2019-01-06 MED ORDER — GABAPENTIN 300 MG PO CAPS
600.00 | ORAL_CAPSULE | ORAL | Status: DC
Start: 2019-01-06 — End: 2019-01-06

## 2019-01-06 MED ORDER — GENERIC EXTERNAL MEDICATION
1.00 | Status: DC
Start: ? — End: 2019-01-06

## 2019-01-06 MED ORDER — BISACODYL 10 MG RE SUPP
10.00 | RECTAL | Status: DC
Start: ? — End: 2019-01-06

## 2019-01-06 MED ORDER — GENERIC EXTERNAL MEDICATION
1.00 | Status: DC
Start: 2019-01-07 — End: 2019-01-06

## 2019-01-06 MED ORDER — PANTOPRAZOLE SODIUM 40 MG PO TBEC
40.00 | DELAYED_RELEASE_TABLET | ORAL | Status: DC
Start: 2019-01-07 — End: 2019-01-06

## 2019-01-06 MED ORDER — ONDANSETRON HCL 4 MG/2ML IJ SOLN
4.00 | INTRAMUSCULAR | Status: DC
Start: ? — End: 2019-01-06

## 2019-01-06 MED ORDER — BISACODYL 5 MG PO TBEC
10.00 | DELAYED_RELEASE_TABLET | ORAL | Status: DC
Start: ? — End: 2019-01-06

## 2019-01-06 MED ORDER — GUAIFENESIN-DM 100-10 MG/5ML PO SYRP
10.00 | ORAL_SOLUTION | ORAL | Status: DC
Start: ? — End: 2019-01-06

## 2019-01-08 ENCOUNTER — Encounter: Payer: Self-pay | Admitting: Family Medicine

## 2019-01-08 NOTE — Progress Notes (Signed)
Virtual Visit via Video Note  I connected with pt on 01/09/19 at 11:00 AM EDT by a video enabled telemedicine application and verified that I am speaking with the correct person using two identifiers.  Location patient: home Location provider:work or home office Persons participating in the virtual visit: patient, provider  I discussed the limitations of evaluation and management by telemedicine and the availability of in person appointments. The patient expressed understanding and agreed to proceed.  Telemedicine visit is a necessity given the COVID-19 restrictions in place at the current time.  HPI: 68 y/o WM being seen today for hospital follow up. He was admitted to [facility] in [place], [place]  5/8-5/11, 2020. Admitted for CP, ruled out for MI.  Stress test normal, as was echo.  EKG unremarkable per hosp notes (no ekg printout in records). Labs showed stable thrombocytopenia (chronic) at 69K, otherwise normal hemograms and CMETs.  He has had no further chest pain.  No new SOB. He has chronic mild SOB and DOE that has been attributed to the effects of diffuse pulm scarring from recurrent aspiration pneumonia in his past.  He last saw pulm (Dr. Joya Gaskins) about 12 yrs ago.  Pt c/o chronic dysphagia that has been gradually worsening the last couple months, says he feels like it needs stretched again.  Admits to drinking 3-4 beers every day but no hard liquor or wine.  Says if he tries to quit for > 1-2 days then he starts feeling the shakes and gives in and starts drinking again. He says he understands that this could (likely will) cause worsening of his chronic liver dz.  ROS: no CP, no wheezing, no cough, no dizziness, no HAs, no rashes, no melena/hematochezia.  No polyuria or polydipsia.  No myalgias or arthralgias.  No fever.  +Chronic loose BMs, multiple per day. No focal weakness.   Past Medical History:  Diagnosis Date  . Alcoholic cirrhosis ([place])    with portal hypertensive  gastropathy, grd I esophageal varices (03/2018 EGD), +thrombocytopenia  . Alcoholic hepatitis   . Alcoholic pancreatitis   . Alcoholism ([place])    Still drinking as of 09/2016  . Anxiety and depression   . Arthritis   . Aspiration pneumonitis ([facility])    recurrent  . Bile reflux esophagitis    Chronic; due to gastrojejunostomy.  He was evaluated by Dr. Demetrio Lapping of [facility] last year for Roux-en-Y procedure. He decided not to proceed with surgery because of high risk. Metoclopramide resulted in diarrhea and dominant and domperodone was ineffective.   . Chest pain 09/2017   ? stable angina, + equivocal lexiscan as outpt---referred to cardiologist 10/30/17.  Marland Kitchen Chronic back pain   . Chronic diarrhea    multifactorial-->improved with scheduled imodium as of 2019  . Crohn disease ([place])    in remission since approx 1995  . DDD (degenerative disc disease), cervical 02/2013; 01/2018   2014-MRI by Dr. Joneen Caraway.  Concern for progression of multilevel dz/chronic neck pain, failed conservative measures: has been offered C4-C7 ACDF-->pt considering as of 02/14/18  . DDD (degenerative disc disease), lumbar    L > R lumbar radiculopathy  . Depression   . Dumping syndrome   . Encephalomalacia on imaging study 02/2013   MRI brain w/o contrast: Mild progression of left occipital lobe encephalomalacia and  . Essential hypertension, benign   . Fatty liver 02/2013; 03/2016; 03/2017   Abd u/s.  Also noted on CT's of abdomen.  No change on 03/2017 u/s except mild  ascites noted.  . Gastroparesis   . GERD (gastroesophageal reflux disease)   . Heart murmur 2018   ECHO: normal except calcified aortic annulus and leaflets, w/out any stenosis or compromise of valve function.  Marland Kitchen History of esophageal stricture    Recurrent--at GE junction.  Has required multiple dilation procedures (most recent 03/2018).  Esophagrams have shown normal swallowing function.  Marland Kitchen History of kidney stones   . History of liver failure    (alchoholic)  Recovered in hosp  . Hypogonadism male    Androgel 1.62%, 2 pumps qd started 02/05/17  . Iron deficiency anemia    Malabsorption:  feraheme infusion 04/24/17 [facility].  Hb normal and iron completely repleted as of 09/2017.  . Malabsorption syndrome    Question pancreatic exocrine insufficiency, but abnorption improved signif after pt stopped alcohol for 6-9 mo (iron/hb, vitamin levels improved)  . Migraine headache   . OSA (obstructive sleep apnea)    cannot tolerate CPAP  . Paraesophageal hernia   . Portal hypertensive gastropathy (HCC) 2019   nadolol  . SOB (shortness of breath)   . Stroke [facility])    short term memory loss.  . Subdural hematoma ([place])   . TIA (transient ischemic attack)   . Vitamin deficiency 2018   Vit A and Vit E (secondary to malabsorption and mild malnutrition)    Past Surgical History:  Procedure Laterality Date  . BILROTH II PROCEDURE     with gastrojejunostomy.  *Loss of duodenal continuity--malabsorption, esp of fat soluble vitamins and iron, may occur.  Marland Kitchen CARDIOVASCULAR STRESS TEST  01/30/14; 09/2017; 01/06/2019   2015 Myocardial perfusion imaging: NORMAL ([name]).  09/2017 lexiscan low risk but with equivocal area in apex that could represent a small area of ischemia.  Referred pt to cardiology.  12/2018 nuc stress NEG, w/nl LV function and wall motioin.  . CHOLECYSTECTOMY  05/2016   Open.    . COLON RESECTION Right    right colon resection and ileocolonic anastamosis  . COLONOSCOPY N/A 07/02/2013   Procedure: COLONOSCOPY;  Surgeon: Rogene Houston, MD;  [facility];  Service: Endoscopy;  Laterality: N/A;  200  . ESOPHAGEAL DILATION N/A 10/30/2014   Procedure: ESOPHAGEAL DILATION;  Surgeon: Rogene Houston, MD;  [facility];  Service: Endoscopy;  Laterality: N/A;  . ESOPHAGEAL DILATION N/A 02/12/2015   Procedure: ESOPHAGEAL DILATION;  Surgeon: Rogene Houston, MD;  [facility];  Service: Endoscopy;  Laterality: N/A;  . ESOPHAGEAL  DILATION N/A 01/21/2016   Procedure: ESOPHAGEAL DILATION;  Surgeon: Rogene Houston, MD;  [facility];  Service: Endoscopy;  Laterality: N/A;  . ESOPHAGEAL DILATION N/A 10/13/2016   H pylori neg.  Esophagitis, esoph stricture, and portal hypertensive gastropathy noted.  Procedure: ESOPHAGEAL DILATION;  Surgeon: Rogene Houston, MD;  [facility];  Service: Endoscopy;  Laterality: N/A;  . ESOPHAGEAL DILATION N/A 04/05/2018   Procedure: ESOPHAGEAL DILATION;  Surgeon: Rogene Houston, MD;  [facility];  Service: Endoscopy;  Laterality: N/A;  . ESOPHAGOGASTRODUODENOSCOPY N/A 08/14/2013   Procedure: ESOPHAGOGASTRODUODENOSCOPY (EGD);  Surgeon: Rogene Houston, MD;  [facility];  Service: Endoscopy;  Laterality: N/A;  300  . ESOPHAGOGASTRODUODENOSCOPY N/A 10/30/2014   Procedure: ESOPHAGOGASTRODUODENOSCOPY (EGD);  Surgeon: Rogene Houston, MD;  [facility];  Service: Endoscopy;  Laterality: N/A;  940  . ESOPHAGOGASTRODUODENOSCOPY N/A 02/12/2015   Procedure: ESOPHAGOGASTRODUODENOSCOPY (EGD);  Surgeon: Rogene Houston, MD;  [facility]  [facility];  Service: Endoscopy;  Laterality: N/A;  105  . ESOPHAGOGASTRODUODENOSCOPY  01/21/16   gastritis/reactive gastropathy.  H pylori neg.  Bx benign.  . ESOPHAGOGASTRODUODENOSCOPY N/A 01/21/2016   Procedure: ESOPHAGOGASTRODUODENOSCOPY (EGD);  Surgeon: Rogene Houston, MD;  [facility];  Service: Endoscopy;  Laterality: N/A;  1:10 - moved to 5/26 @ 8:25  . ESOPHAGOGASTRODUODENOSCOPY  10/13/2016   Benign gastric mucosa.  Chronic inactive gastritis.  H pylori NEG (Dr. Laural Golden)  . ESOPHAGOGASTRODUODENOSCOPY (EGD) WITH PROPOFOL N/A 10/13/2016   Procedure: ESOPHAGOGASTRODUODENOSCOPY (EGD) WITH PROPOFOL;  Surgeon: Rogene Houston, MD;  [facility];  Service: Endoscopy;  Laterality: N/A;  9:15  . ESOPHAGOGASTRODUODENOSCOPY (EGD) WITH PROPOFOL N/A 04/05/2018   Procedure: ESOPHAGOGASTRODUODENOSCOPY (EGD)  WITH PROPOFOL;  Surgeon: Rogene Houston, MD;  [facility];  Service: Endoscopy;  Laterality: N/A;  8:55 - per office - pt unable to come earlier  . HIATAL HERNIA REPAIR  09/2007   He underwent repair of large hiatal hernia and pyloroplasty which was complicated by pyloroplasty leak and subsequent gastrojejunostomy  . NISSEN FUNDOPLICATION     x2  . NOCTURNAL POLYSOMNOGRAM  05/2013   Severe periodic limb movement d/o: trial of dopamine agonist such as requip was recommended.  Marland Kitchen PYLOROPLASTY  2009  . TRANSTHORACIC ECHOCARDIOGRAM  12/2013; 12/2016;01/06/2019   2015: EF 65-70%, mild LVH, grade I DD, no valvular problems.  2018: EF 55-60%, no wall motion abnormalities, valves normal except aortic leaflets and annulus calcifications w/out any stenosis or regurg.  12/2018->EF 55-60%, nl LV fxn, mild MR and TR.    Family History  Problem Relation Age of Onset  . Healthy Daughter   . Healthy Son   . Heart attack Mother        CABG  . Stroke Mother   . Heart attack Father        CABG  . Heart attack Brother        CABG     Current Outpatient Medications:  .  FLUoxetine (PROZAC) 40 MG capsule, TAKE 1 CAPSULE (40 MG TOTAL) BY MOUTH DAILY. OFFICE VISIT NEEDED FOR MORE REFILLS., Disp: 30 capsule, Rfl: 6 .  gabapentin (NEURONTIN) 300 MG capsule, TAKE 1 CAPSULE BY MOUTH IN THE MORNING , AT NOON, IN THE EVENING AND TAKE TWO CAPSULES BY MOUTH AT BEDTIME, Disp: 150 capsule, Rfl: 5 .  loperamide (IMODIUM A-D) 2 MG tablet, Take 1 tablet (2 mg total) by mouth 3 (three) times daily before meals., Disp: 30 tablet, Rfl: 0 .  loratadine (CLARITIN) 10 MG tablet, Take 10 mg by mouth daily as needed for allergies. , Disp: , Rfl:  .  Multiple Vitamin (MULTIVITAMIN WITH MINERALS) TABS tablet, Take 1 tablet by mouth daily., Disp: , Rfl:  .  nadolol (CORGARD) 20 MG tablet, Take 1 tablet (20 mg total) by mouth daily., Disp: 30 tablet, Rfl: 11 .  omeprazole (PRILOSEC) 20 MG capsule, TAKE 1 CAPSULE (20 MG  TOTAL) BY MOUTH DAILY., Disp: 30 capsule, Rfl: 0 .  thiamine (VITAMIN B-1) 100 MG tablet, Take by mouth., Disp: , Rfl:  .  traZODone (DESYREL) 50 MG tablet, TAKE 1-2 TABLETS BY MOUTH AT BEDTIME FOR INSOMNIA, Disp: 60 tablet, Rfl: 6 .  ondansetron (ZOFRAN) 4 MG tablet, TAKE ONE TABLET BY MOUTH TWICE DAILY AS NEEDED FOR NAUSEA OR VOMITING. DO NOT TAKE DAILY. (Patient not taking: Reported on 01/09/2019), Disp: 30 tablet, Rfl: 0 .  vitamin A 8000 UNIT capsule, Take 1 capsule (  8,000 Units total) by mouth daily. (Patient not taking: Reported on 01/09/2019), Disp: 90 capsule, Rfl: 0  EXAM:  VITALS per patient if applicable: BP 78/93 (BP Location: Left Arm, Patient Position: Sitting, Cuff Size: Normal)   Pulse 73   Ht 6' (1.829 m)   BMI 21.94 kg/m    GENERAL: alert, oriented, appears well and in no acute distress  HEENT: atraumatic, conjunttiva clear, no obvious abnormalities on inspection of external nose and ears  NECK: normal movements of the head and neck  LUNGS: on inspection no signs of respiratory distress, breathing rate appears normal, no obvious gross SOB, gasping or wheezing  CV: no obvious cyanosis  MS: moves all visible extremities without noticeable abnormality  PSYCH/NEURO: pleasant and cooperative, no obvious depression or anxiety, speech and thought processing grossly intact  ASSESSMENT AND PLAN:  Discussed the following assessment and plan:  1) Chest pain, hospital f/u. Suspect this is either GI-related or musculoskeletal.  It has resolved. Pt will contact his GI MD, Dr. Laural Golden, as soon as possible to discuss his worsening dysphagia, possible need for EGD with esoph dilation as he has had multiple times in the past.   He'll continue daily PPI. He avoids NSAIDs.  No changes in med regimen made today. No labs needed at this time.   I discussed the assessment and treatment plan with the patient. The patient was provided an opportunity to ask questions and all were  answered. The patient agreed with the plan and demonstrated an understanding of the instructions.   The patient was advised to call back or seek an in-person evaluation if the symptoms worsen or if the condition fails to improve as anticipated.  F/u: 6 mo RCI  Signed:  Crissie Sickles, MD           01/09/2019

## 2019-01-09 ENCOUNTER — Encounter: Payer: Self-pay | Admitting: Family Medicine

## 2019-01-09 ENCOUNTER — Ambulatory Visit (INDEPENDENT_AMBULATORY_CARE_PROVIDER_SITE_OTHER): Payer: Medicare Other | Admitting: Family Medicine

## 2019-01-09 VITALS — BP 91/60 | HR 73 | Ht 72.0 in

## 2019-01-09 DIAGNOSIS — R079 Chest pain, unspecified: Secondary | ICD-10-CM

## 2019-01-16 DIAGNOSIS — R600 Localized edema: Secondary | ICD-10-CM | POA: Diagnosis not present

## 2019-01-16 DIAGNOSIS — S51811A Laceration without foreign body of right forearm, initial encounter: Secondary | ICD-10-CM | POA: Diagnosis not present

## 2019-01-16 DIAGNOSIS — R0602 Shortness of breath: Secondary | ICD-10-CM | POA: Diagnosis not present

## 2019-01-16 DIAGNOSIS — S8012XA Contusion of left lower leg, initial encounter: Secondary | ICD-10-CM | POA: Diagnosis not present

## 2019-01-16 DIAGNOSIS — S8011XA Contusion of right lower leg, initial encounter: Secondary | ICD-10-CM | POA: Diagnosis not present

## 2019-01-16 DIAGNOSIS — R9431 Abnormal electrocardiogram [ECG] [EKG]: Secondary | ICD-10-CM | POA: Diagnosis not present

## 2019-01-19 DIAGNOSIS — K5090 Crohn's disease, unspecified, without complications: Secondary | ICD-10-CM | POA: Diagnosis not present

## 2019-01-19 DIAGNOSIS — I8500 Esophageal varices without bleeding: Secondary | ICD-10-CM | POA: Diagnosis not present

## 2019-01-19 DIAGNOSIS — G935 Compression of brain: Secondary | ICD-10-CM | POA: Diagnosis not present

## 2019-01-19 DIAGNOSIS — G9341 Metabolic encephalopathy: Secondary | ICD-10-CM | POA: Diagnosis not present

## 2019-01-19 DIAGNOSIS — S064X9A Epidural hemorrhage with loss of consciousness of unspecified duration, initial encounter: Secondary | ICD-10-CM | POA: Diagnosis not present

## 2019-01-19 DIAGNOSIS — R488 Other symbolic dysfunctions: Secondary | ICD-10-CM | POA: Diagnosis not present

## 2019-01-19 DIAGNOSIS — Z79899 Other long term (current) drug therapy: Secondary | ICD-10-CM | POA: Diagnosis not present

## 2019-01-19 DIAGNOSIS — Z043 Encounter for examination and observation following other accident: Secondary | ICD-10-CM | POA: Diagnosis not present

## 2019-01-19 DIAGNOSIS — W19XXXD Unspecified fall, subsequent encounter: Secondary | ICD-10-CM | POA: Diagnosis not present

## 2019-01-19 DIAGNOSIS — K219 Gastro-esophageal reflux disease without esophagitis: Secondary | ICD-10-CM | POA: Diagnosis not present

## 2019-01-19 DIAGNOSIS — M6281 Muscle weakness (generalized): Secondary | ICD-10-CM | POA: Diagnosis not present

## 2019-01-19 DIAGNOSIS — I083 Combined rheumatic disorders of mitral, aortic and tricuspid valves: Secondary | ICD-10-CM | POA: Diagnosis not present

## 2019-01-19 DIAGNOSIS — R1311 Dysphagia, oral phase: Secondary | ICD-10-CM | POA: Diagnosis not present

## 2019-01-19 DIAGNOSIS — S60511A Abrasion of right hand, initial encounter: Secondary | ICD-10-CM | POA: Diagnosis not present

## 2019-01-19 DIAGNOSIS — G4700 Insomnia, unspecified: Secondary | ICD-10-CM | POA: Diagnosis not present

## 2019-01-19 DIAGNOSIS — Z23 Encounter for immunization: Secondary | ICD-10-CM | POA: Diagnosis not present

## 2019-01-19 DIAGNOSIS — D689 Coagulation defect, unspecified: Secondary | ICD-10-CM | POA: Diagnosis not present

## 2019-01-19 DIAGNOSIS — I499 Cardiac arrhythmia, unspecified: Secondary | ICD-10-CM | POA: Diagnosis not present

## 2019-01-19 DIAGNOSIS — D539 Nutritional anemia, unspecified: Secondary | ICD-10-CM | POA: Diagnosis not present

## 2019-01-19 DIAGNOSIS — Z888 Allergy status to other drugs, medicaments and biological substances status: Secondary | ICD-10-CM | POA: Diagnosis not present

## 2019-01-19 DIAGNOSIS — D696 Thrombocytopenia, unspecified: Secondary | ICD-10-CM | POA: Diagnosis not present

## 2019-01-19 DIAGNOSIS — K2970 Gastritis, unspecified, without bleeding: Secondary | ICD-10-CM | POA: Diagnosis not present

## 2019-01-19 DIAGNOSIS — R269 Unspecified abnormalities of gait and mobility: Secondary | ICD-10-CM | POA: Diagnosis not present

## 2019-01-19 DIAGNOSIS — M47816 Spondylosis without myelopathy or radiculopathy, lumbar region: Secondary | ICD-10-CM | POA: Diagnosis not present

## 2019-01-19 DIAGNOSIS — M455 Ankylosing spondylitis of thoracolumbar region: Secondary | ICD-10-CM | POA: Diagnosis not present

## 2019-01-19 DIAGNOSIS — R1314 Dysphagia, pharyngoesophageal phase: Secondary | ICD-10-CM | POA: Diagnosis not present

## 2019-01-19 DIAGNOSIS — E871 Hypo-osmolality and hyponatremia: Secondary | ICD-10-CM | POA: Diagnosis not present

## 2019-01-19 DIAGNOSIS — S50811A Abrasion of right forearm, initial encounter: Secondary | ICD-10-CM | POA: Diagnosis not present

## 2019-01-19 DIAGNOSIS — S065X9A Traumatic subdural hemorrhage with loss of consciousness of unspecified duration, initial encounter: Secondary | ICD-10-CM | POA: Diagnosis not present

## 2019-01-19 DIAGNOSIS — G459 Transient cerebral ischemic attack, unspecified: Secondary | ICD-10-CM | POA: Diagnosis not present

## 2019-01-19 DIAGNOSIS — S064X0A Epidural hemorrhage without loss of consciousness, initial encounter: Secondary | ICD-10-CM | POA: Diagnosis not present

## 2019-01-19 DIAGNOSIS — S064X0D Epidural hemorrhage without loss of consciousness, subsequent encounter: Secondary | ICD-10-CM | POA: Diagnosis not present

## 2019-01-19 DIAGNOSIS — S0003XA Contusion of scalp, initial encounter: Secondary | ICD-10-CM | POA: Diagnosis not present

## 2019-01-19 DIAGNOSIS — R2689 Other abnormalities of gait and mobility: Secondary | ICD-10-CM | POA: Diagnosis not present

## 2019-01-19 DIAGNOSIS — Z1159 Encounter for screening for other viral diseases: Secondary | ICD-10-CM | POA: Diagnosis not present

## 2019-01-19 DIAGNOSIS — E162 Hypoglycemia, unspecified: Secondary | ICD-10-CM | POA: Diagnosis not present

## 2019-01-19 DIAGNOSIS — S065X0A Traumatic subdural hemorrhage without loss of consciousness, initial encounter: Secondary | ICD-10-CM | POA: Diagnosis not present

## 2019-01-19 DIAGNOSIS — R296 Repeated falls: Secondary | ICD-10-CM | POA: Diagnosis not present

## 2019-01-19 DIAGNOSIS — R1310 Dysphagia, unspecified: Secondary | ICD-10-CM | POA: Diagnosis not present

## 2019-01-19 DIAGNOSIS — D638 Anemia in other chronic diseases classified elsewhere: Secondary | ICD-10-CM | POA: Diagnosis not present

## 2019-01-20 DIAGNOSIS — D689 Coagulation defect, unspecified: Secondary | ICD-10-CM | POA: Diagnosis not present

## 2019-01-20 DIAGNOSIS — S064X0A Epidural hemorrhage without loss of consciousness, initial encounter: Secondary | ICD-10-CM | POA: Diagnosis not present

## 2019-01-20 DIAGNOSIS — D696 Thrombocytopenia, unspecified: Secondary | ICD-10-CM | POA: Diagnosis not present

## 2019-01-20 DIAGNOSIS — I499 Cardiac arrhythmia, unspecified: Secondary | ICD-10-CM | POA: Diagnosis not present

## 2019-01-20 DIAGNOSIS — S064X9A Epidural hemorrhage with loss of consciousness of unspecified duration, initial encounter: Secondary | ICD-10-CM | POA: Diagnosis not present

## 2019-01-20 DIAGNOSIS — G935 Compression of brain: Secondary | ICD-10-CM | POA: Diagnosis not present

## 2019-01-21 DIAGNOSIS — S064X0A Epidural hemorrhage without loss of consciousness, initial encounter: Secondary | ICD-10-CM | POA: Diagnosis not present

## 2019-01-21 DIAGNOSIS — D689 Coagulation defect, unspecified: Secondary | ICD-10-CM | POA: Diagnosis not present

## 2019-01-21 DIAGNOSIS — G935 Compression of brain: Secondary | ICD-10-CM | POA: Diagnosis not present

## 2019-01-21 DIAGNOSIS — D696 Thrombocytopenia, unspecified: Secondary | ICD-10-CM | POA: Diagnosis not present

## 2019-01-21 DIAGNOSIS — S064X9A Epidural hemorrhage with loss of consciousness of unspecified duration, initial encounter: Secondary | ICD-10-CM | POA: Diagnosis not present

## 2019-01-21 DIAGNOSIS — I083 Combined rheumatic disorders of mitral, aortic and tricuspid valves: Secondary | ICD-10-CM | POA: Diagnosis not present

## 2019-01-22 DIAGNOSIS — S065X9A Traumatic subdural hemorrhage with loss of consciousness of unspecified duration, initial encounter: Secondary | ICD-10-CM | POA: Diagnosis not present

## 2019-01-22 DIAGNOSIS — S064X9A Epidural hemorrhage with loss of consciousness of unspecified duration, initial encounter: Secondary | ICD-10-CM | POA: Diagnosis not present

## 2019-01-23 DIAGNOSIS — M62838 Other muscle spasm: Secondary | ICD-10-CM | POA: Diagnosis not present

## 2019-01-23 DIAGNOSIS — R17 Unspecified jaundice: Secondary | ICD-10-CM | POA: Diagnosis not present

## 2019-01-23 DIAGNOSIS — W19XXXD Unspecified fall, subsequent encounter: Secondary | ICD-10-CM | POA: Diagnosis not present

## 2019-01-23 DIAGNOSIS — G459 Transient cerebral ischemic attack, unspecified: Secondary | ICD-10-CM | POA: Diagnosis not present

## 2019-01-23 DIAGNOSIS — R269 Unspecified abnormalities of gait and mobility: Secondary | ICD-10-CM | POA: Diagnosis not present

## 2019-01-23 DIAGNOSIS — G9341 Metabolic encephalopathy: Secondary | ICD-10-CM | POA: Diagnosis not present

## 2019-01-23 DIAGNOSIS — G4700 Insomnia, unspecified: Secondary | ICD-10-CM | POA: Diagnosis not present

## 2019-01-23 DIAGNOSIS — S064X9A Epidural hemorrhage with loss of consciousness of unspecified duration, initial encounter: Secondary | ICD-10-CM | POA: Diagnosis not present

## 2019-01-23 DIAGNOSIS — R296 Repeated falls: Secondary | ICD-10-CM | POA: Diagnosis not present

## 2019-01-23 DIAGNOSIS — R1311 Dysphagia, oral phase: Secondary | ICD-10-CM | POA: Diagnosis not present

## 2019-01-23 DIAGNOSIS — R488 Other symbolic dysfunctions: Secondary | ICD-10-CM | POA: Diagnosis not present

## 2019-01-23 DIAGNOSIS — S065X9A Traumatic subdural hemorrhage with loss of consciousness of unspecified duration, initial encounter: Secondary | ICD-10-CM | POA: Diagnosis not present

## 2019-01-23 DIAGNOSIS — S064X0D Epidural hemorrhage without loss of consciousness, subsequent encounter: Secondary | ICD-10-CM | POA: Diagnosis not present

## 2019-01-23 DIAGNOSIS — K219 Gastro-esophageal reflux disease without esophagitis: Secondary | ICD-10-CM | POA: Diagnosis not present

## 2019-01-23 DIAGNOSIS — I8500 Esophageal varices without bleeding: Secondary | ICD-10-CM | POA: Diagnosis not present

## 2019-01-23 DIAGNOSIS — K2970 Gastritis, unspecified, without bleeding: Secondary | ICD-10-CM | POA: Diagnosis not present

## 2019-01-23 DIAGNOSIS — M6281 Muscle weakness (generalized): Secondary | ICD-10-CM | POA: Diagnosis not present

## 2019-01-23 DIAGNOSIS — M47816 Spondylosis without myelopathy or radiculopathy, lumbar region: Secondary | ICD-10-CM | POA: Diagnosis not present

## 2019-01-23 DIAGNOSIS — Z23 Encounter for immunization: Secondary | ICD-10-CM | POA: Diagnosis not present

## 2019-01-23 DIAGNOSIS — M47896 Other spondylosis, lumbar region: Secondary | ICD-10-CM | POA: Diagnosis not present

## 2019-01-23 DIAGNOSIS — K5090 Crohn's disease, unspecified, without complications: Secondary | ICD-10-CM | POA: Diagnosis not present

## 2019-01-23 DIAGNOSIS — R1310 Dysphagia, unspecified: Secondary | ICD-10-CM | POA: Diagnosis not present

## 2019-01-23 DIAGNOSIS — R1314 Dysphagia, pharyngoesophageal phase: Secondary | ICD-10-CM | POA: Diagnosis not present

## 2019-01-23 DIAGNOSIS — M455 Ankylosing spondylitis of thoracolumbar region: Secondary | ICD-10-CM | POA: Diagnosis not present

## 2019-01-23 DIAGNOSIS — D539 Nutritional anemia, unspecified: Secondary | ICD-10-CM | POA: Diagnosis not present

## 2019-01-23 DIAGNOSIS — R5383 Other fatigue: Secondary | ICD-10-CM | POA: Diagnosis not present

## 2019-01-23 DIAGNOSIS — R2689 Other abnormalities of gait and mobility: Secondary | ICD-10-CM | POA: Diagnosis not present

## 2019-01-31 DIAGNOSIS — R5383 Other fatigue: Secondary | ICD-10-CM | POA: Diagnosis not present

## 2019-01-31 DIAGNOSIS — S064X0D Epidural hemorrhage without loss of consciousness, subsequent encounter: Secondary | ICD-10-CM | POA: Diagnosis not present

## 2019-01-31 DIAGNOSIS — R17 Unspecified jaundice: Secondary | ICD-10-CM | POA: Diagnosis not present

## 2019-02-03 DIAGNOSIS — M62838 Other muscle spasm: Secondary | ICD-10-CM | POA: Diagnosis not present

## 2019-02-03 DIAGNOSIS — G4700 Insomnia, unspecified: Secondary | ICD-10-CM | POA: Diagnosis not present

## 2019-02-05 DIAGNOSIS — S064X0D Epidural hemorrhage without loss of consciousness, subsequent encounter: Secondary | ICD-10-CM | POA: Diagnosis not present

## 2019-02-05 DIAGNOSIS — I8500 Esophageal varices without bleeding: Secondary | ICD-10-CM | POA: Diagnosis not present

## 2019-02-05 DIAGNOSIS — K5090 Crohn's disease, unspecified, without complications: Secondary | ICD-10-CM | POA: Diagnosis not present

## 2019-02-05 DIAGNOSIS — M6281 Muscle weakness (generalized): Secondary | ICD-10-CM | POA: Diagnosis not present

## 2019-02-07 ENCOUNTER — Telehealth: Payer: Self-pay

## 2019-02-07 NOTE — Telephone Encounter (Signed)
Spoke with patient earlier today, completed TCM with daughter Ailene Ravel.   Discharge note not available at time of call. Daughter provided details of hosp and SNF admission.   Transition Care Management Follow-up Telephone Call   Hospital admission [facility]) 01/18/2019-01/23/2019  SNF admission : 01/23/2019-02/06/2019   How have you been since you were released from the hospital? "He's tired"   Do you understand why you were in the hospital? yes, "fell 4 times in 1 week; head bleed; alcohol abuse   Do you understand the discharge instructions? yes   Where were you discharged to? Home. Resides with family   Items Reviewed:  Medications reviewed: yes  Allergies reviewed: yes  Dietary changes reviewed: yes  Referrals reviewed: yes   Functional Questionnaire:   Activities of Daily Living (ADLs):   He states they are independent in the following: ambulation, bathing and hygiene, feeding, continence, grooming, toileting and dressing States they require assistance with the following: Daughter states pt able to complete ADLs, but slow and unsteady   Any transportation issues/concerns?: no   Any patient concerns? Yes. Daughter reports pt has abdominal swelling/hard occasionally. Passing gas and stool. Several questions regarding medications, updated list via bottles/discharge note provided by daughter. [facility] ordered at discharge for PT/OT. BP today 143/82, [place] not re-ordered at discharge. Daughter requesting letter from PCP stating patient unable to perform "blow and go" in vehicle that is mandated.    Confirmed importance and date/time of follow-up visits scheduled yes  Provider Appointment booked with PCP 02/18/2019 via virtual visit.   Confirmed with patient if condition begins to worsen call PCP or go to the ER.  Patient was given the office number and encouraged to call back with question or concerns.  : yes

## 2019-02-08 DIAGNOSIS — S064X0D Epidural hemorrhage without loss of consciousness, subsequent encounter: Secondary | ICD-10-CM | POA: Diagnosis not present

## 2019-02-08 DIAGNOSIS — K219 Gastro-esophageal reflux disease without esophagitis: Secondary | ICD-10-CM | POA: Diagnosis not present

## 2019-02-08 DIAGNOSIS — M6281 Muscle weakness (generalized): Secondary | ICD-10-CM | POA: Diagnosis not present

## 2019-02-08 DIAGNOSIS — M47816 Spondylosis without myelopathy or radiculopathy, lumbar region: Secondary | ICD-10-CM | POA: Diagnosis not present

## 2019-02-08 DIAGNOSIS — Z9181 History of falling: Secondary | ICD-10-CM | POA: Diagnosis not present

## 2019-02-08 DIAGNOSIS — R296 Repeated falls: Secondary | ICD-10-CM | POA: Diagnosis not present

## 2019-02-08 NOTE — Telephone Encounter (Signed)
I'm gonna hold off on that letter until I look into what kind of effort the "blow and go" requires.-thx

## 2019-02-11 ENCOUNTER — Telehealth: Payer: Self-pay | Admitting: Family Medicine

## 2019-02-11 DIAGNOSIS — R296 Repeated falls: Secondary | ICD-10-CM | POA: Diagnosis not present

## 2019-02-11 DIAGNOSIS — S064X0D Epidural hemorrhage without loss of consciousness, subsequent encounter: Secondary | ICD-10-CM | POA: Diagnosis not present

## 2019-02-11 DIAGNOSIS — Z9181 History of falling: Secondary | ICD-10-CM | POA: Diagnosis not present

## 2019-02-11 DIAGNOSIS — K219 Gastro-esophageal reflux disease without esophagitis: Secondary | ICD-10-CM | POA: Diagnosis not present

## 2019-02-11 DIAGNOSIS — M47816 Spondylosis without myelopathy or radiculopathy, lumbar region: Secondary | ICD-10-CM | POA: Diagnosis not present

## 2019-02-11 DIAGNOSIS — M6281 Muscle weakness (generalized): Secondary | ICD-10-CM | POA: Diagnosis not present

## 2019-02-11 NOTE — Telephone Encounter (Signed)
Yes this is fine.

## 2019-02-11 NOTE — Telephone Encounter (Signed)
Please advise, thanks.

## 2019-02-11 NOTE — Telephone Encounter (Signed)
Gary Vasquez from [facility] called to request to add home health orders for RN nursing evaluation with medication management. Patient and his family are requesting services.  Currently patient only has orders for PT.

## 2019-02-13 DIAGNOSIS — M47816 Spondylosis without myelopathy or radiculopathy, lumbar region: Secondary | ICD-10-CM | POA: Diagnosis not present

## 2019-02-13 DIAGNOSIS — R296 Repeated falls: Secondary | ICD-10-CM | POA: Diagnosis not present

## 2019-02-13 DIAGNOSIS — Z9181 History of falling: Secondary | ICD-10-CM | POA: Diagnosis not present

## 2019-02-13 DIAGNOSIS — K219 Gastro-esophageal reflux disease without esophagitis: Secondary | ICD-10-CM | POA: Diagnosis not present

## 2019-02-13 DIAGNOSIS — S064X0D Epidural hemorrhage without loss of consciousness, subsequent encounter: Secondary | ICD-10-CM | POA: Diagnosis not present

## 2019-02-13 DIAGNOSIS — M6281 Muscle weakness (generalized): Secondary | ICD-10-CM | POA: Diagnosis not present

## 2019-02-14 DIAGNOSIS — G4700 Insomnia, unspecified: Secondary | ICD-10-CM | POA: Diagnosis not present

## 2019-02-14 DIAGNOSIS — K7460 Unspecified cirrhosis of liver: Secondary | ICD-10-CM | POA: Diagnosis not present

## 2019-02-14 DIAGNOSIS — S065X0A Traumatic subdural hemorrhage without loss of consciousness, initial encounter: Secondary | ICD-10-CM | POA: Diagnosis not present

## 2019-02-15 DIAGNOSIS — E876 Hypokalemia: Secondary | ICD-10-CM | POA: Diagnosis not present

## 2019-02-15 DIAGNOSIS — Z515 Encounter for palliative care: Secondary | ICD-10-CM | POA: Diagnosis not present

## 2019-02-15 DIAGNOSIS — Z9911 Dependence on respirator [ventilator] status: Secondary | ICD-10-CM | POA: Diagnosis not present

## 2019-02-15 DIAGNOSIS — Z9181 History of falling: Secondary | ICD-10-CM | POA: Diagnosis not present

## 2019-02-15 DIAGNOSIS — I517 Cardiomegaly: Secondary | ICD-10-CM | POA: Diagnosis not present

## 2019-02-15 DIAGNOSIS — E872 Acidosis: Secondary | ICD-10-CM | POA: Diagnosis not present

## 2019-02-15 DIAGNOSIS — J9691 Respiratory failure, unspecified with hypoxia: Secondary | ICD-10-CM | POA: Diagnosis not present

## 2019-02-15 DIAGNOSIS — K766 Portal hypertension: Secondary | ICD-10-CM | POA: Diagnosis not present

## 2019-02-15 DIAGNOSIS — Y999 Unspecified external cause status: Secondary | ICD-10-CM | POA: Diagnosis not present

## 2019-02-15 DIAGNOSIS — W1789XA Other fall from one level to another, initial encounter: Secondary | ICD-10-CM | POA: Diagnosis not present

## 2019-02-15 DIAGNOSIS — R188 Other ascites: Secondary | ICD-10-CM | POA: Diagnosis not present

## 2019-02-15 DIAGNOSIS — R278 Other lack of coordination: Secondary | ICD-10-CM | POA: Diagnosis not present

## 2019-02-15 DIAGNOSIS — D539 Nutritional anemia, unspecified: Secondary | ICD-10-CM | POA: Diagnosis not present

## 2019-02-15 DIAGNOSIS — G9341 Metabolic encephalopathy: Secondary | ICD-10-CM | POA: Diagnosis not present

## 2019-02-15 DIAGNOSIS — K5090 Crohn's disease, unspecified, without complications: Secondary | ICD-10-CM | POA: Diagnosis not present

## 2019-02-15 DIAGNOSIS — R2681 Unsteadiness on feet: Secondary | ICD-10-CM | POA: Diagnosis not present

## 2019-02-15 DIAGNOSIS — R579 Shock, unspecified: Secondary | ICD-10-CM | POA: Diagnosis not present

## 2019-02-15 DIAGNOSIS — G629 Polyneuropathy, unspecified: Secondary | ICD-10-CM | POA: Diagnosis not present

## 2019-02-15 DIAGNOSIS — N179 Acute kidney failure, unspecified: Secondary | ICD-10-CM | POA: Diagnosis not present

## 2019-02-15 DIAGNOSIS — I959 Hypotension, unspecified: Secondary | ICD-10-CM | POA: Diagnosis not present

## 2019-02-15 DIAGNOSIS — K3189 Other diseases of stomach and duodenum: Secondary | ICD-10-CM | POA: Diagnosis not present

## 2019-02-15 DIAGNOSIS — R918 Other nonspecific abnormal finding of lung field: Secondary | ICD-10-CM | POA: Diagnosis not present

## 2019-02-15 DIAGNOSIS — I10 Essential (primary) hypertension: Secondary | ICD-10-CM | POA: Diagnosis not present

## 2019-02-15 DIAGNOSIS — J984 Other disorders of lung: Secondary | ICD-10-CM | POA: Diagnosis not present

## 2019-02-15 DIAGNOSIS — K7291 Hepatic failure, unspecified with coma: Secondary | ICD-10-CM | POA: Diagnosis not present

## 2019-02-15 DIAGNOSIS — E869 Volume depletion, unspecified: Secondary | ICD-10-CM | POA: Diagnosis not present

## 2019-02-15 DIAGNOSIS — R42 Dizziness and giddiness: Secondary | ICD-10-CM | POA: Diagnosis not present

## 2019-02-15 DIAGNOSIS — I6203 Nontraumatic chronic subdural hemorrhage: Secondary | ICD-10-CM | POA: Diagnosis not present

## 2019-02-15 DIAGNOSIS — G9340 Encephalopathy, unspecified: Secondary | ICD-10-CM | POA: Diagnosis not present

## 2019-02-15 DIAGNOSIS — E870 Hyperosmolality and hypernatremia: Secondary | ICD-10-CM | POA: Diagnosis not present

## 2019-02-15 DIAGNOSIS — E871 Hypo-osmolality and hyponatremia: Secondary | ICD-10-CM | POA: Diagnosis not present

## 2019-02-15 DIAGNOSIS — D696 Thrombocytopenia, unspecified: Secondary | ICD-10-CM | POA: Diagnosis not present

## 2019-02-15 DIAGNOSIS — Z66 Do not resuscitate: Secondary | ICD-10-CM | POA: Diagnosis not present

## 2019-02-15 DIAGNOSIS — I8500 Esophageal varices without bleeding: Secondary | ICD-10-CM | POA: Diagnosis not present

## 2019-02-15 DIAGNOSIS — R6521 Severe sepsis with septic shock: Secondary | ICD-10-CM | POA: Diagnosis not present

## 2019-02-15 DIAGNOSIS — R140 Abdominal distension (gaseous): Secondary | ICD-10-CM | POA: Diagnosis not present

## 2019-02-15 DIAGNOSIS — R079 Chest pain, unspecified: Secondary | ICD-10-CM | POA: Diagnosis not present

## 2019-02-15 DIAGNOSIS — J810 Acute pulmonary edema: Secondary | ICD-10-CM | POA: Diagnosis not present

## 2019-02-15 DIAGNOSIS — J80 Acute respiratory distress syndrome: Secondary | ICD-10-CM | POA: Diagnosis not present

## 2019-02-15 DIAGNOSIS — M542 Cervicalgia: Secondary | ICD-10-CM | POA: Diagnosis not present

## 2019-02-15 DIAGNOSIS — E8809 Other disorders of plasma-protein metabolism, not elsewhere classified: Secondary | ICD-10-CM | POA: Diagnosis not present

## 2019-02-15 DIAGNOSIS — E8770 Fluid overload, unspecified: Secondary | ICD-10-CM | POA: Diagnosis not present

## 2019-02-15 DIAGNOSIS — J90 Pleural effusion, not elsewhere classified: Secondary | ICD-10-CM | POA: Diagnosis not present

## 2019-02-15 DIAGNOSIS — Z4682 Encounter for fitting and adjustment of non-vascular catheter: Secondary | ICD-10-CM | POA: Diagnosis not present

## 2019-02-15 DIAGNOSIS — E878 Other disorders of electrolyte and fluid balance, not elsewhere classified: Secondary | ICD-10-CM | POA: Diagnosis not present

## 2019-02-15 DIAGNOSIS — S065X9A Traumatic subdural hemorrhage with loss of consciousness of unspecified duration, initial encounter: Secondary | ICD-10-CM | POA: Diagnosis not present

## 2019-02-15 DIAGNOSIS — S065X0A Traumatic subdural hemorrhage without loss of consciousness, initial encounter: Secondary | ICD-10-CM | POA: Diagnosis not present

## 2019-02-15 DIAGNOSIS — I361 Nonrheumatic tricuspid (valve) insufficiency: Secondary | ICD-10-CM | POA: Diagnosis not present

## 2019-02-15 DIAGNOSIS — Y9301 Activity, walking, marching and hiking: Secondary | ICD-10-CM | POA: Diagnosis not present

## 2019-02-15 DIAGNOSIS — A419 Sepsis, unspecified organism: Secondary | ICD-10-CM | POA: Diagnosis not present

## 2019-02-15 DIAGNOSIS — J9601 Acute respiratory failure with hypoxia: Secondary | ICD-10-CM | POA: Diagnosis not present

## 2019-02-15 DIAGNOSIS — I6201 Nontraumatic acute subdural hemorrhage: Secondary | ICD-10-CM | POA: Diagnosis not present

## 2019-02-15 DIAGNOSIS — R9431 Abnormal electrocardiogram [ECG] [EKG]: Secondary | ICD-10-CM | POA: Diagnosis not present

## 2019-02-15 DIAGNOSIS — K7290 Hepatic failure, unspecified without coma: Secondary | ICD-10-CM | POA: Diagnosis not present

## 2019-02-16 DIAGNOSIS — N179 Acute kidney failure, unspecified: Secondary | ICD-10-CM | POA: Diagnosis not present

## 2019-02-16 DIAGNOSIS — R140 Abdominal distension (gaseous): Secondary | ICD-10-CM | POA: Diagnosis not present

## 2019-02-16 DIAGNOSIS — R9431 Abnormal electrocardiogram [ECG] [EKG]: Secondary | ICD-10-CM | POA: Diagnosis not present

## 2019-02-16 DIAGNOSIS — S065X9A Traumatic subdural hemorrhage with loss of consciousness of unspecified duration, initial encounter: Secondary | ICD-10-CM | POA: Diagnosis not present

## 2019-02-16 DIAGNOSIS — R079 Chest pain, unspecified: Secondary | ICD-10-CM | POA: Diagnosis not present

## 2019-02-16 DIAGNOSIS — R2681 Unsteadiness on feet: Secondary | ICD-10-CM | POA: Diagnosis not present

## 2019-02-16 DIAGNOSIS — D539 Nutritional anemia, unspecified: Secondary | ICD-10-CM | POA: Diagnosis not present

## 2019-02-17 DIAGNOSIS — R278 Other lack of coordination: Secondary | ICD-10-CM | POA: Diagnosis not present

## 2019-02-17 DIAGNOSIS — R188 Other ascites: Secondary | ICD-10-CM | POA: Diagnosis not present

## 2019-02-17 DIAGNOSIS — I8500 Esophageal varices without bleeding: Secondary | ICD-10-CM | POA: Diagnosis not present

## 2019-02-17 DIAGNOSIS — K7291 Hepatic failure, unspecified with coma: Secondary | ICD-10-CM | POA: Diagnosis not present

## 2019-02-17 DIAGNOSIS — S065X0A Traumatic subdural hemorrhage without loss of consciousness, initial encounter: Secondary | ICD-10-CM | POA: Diagnosis not present

## 2019-02-17 DIAGNOSIS — N179 Acute kidney failure, unspecified: Secondary | ICD-10-CM | POA: Diagnosis not present

## 2019-02-18 ENCOUNTER — Other Ambulatory Visit: Payer: Self-pay

## 2019-02-18 ENCOUNTER — Inpatient Hospital Stay: Payer: Medicare Other | Admitting: Family Medicine

## 2019-02-18 DIAGNOSIS — R9431 Abnormal electrocardiogram [ECG] [EKG]: Secondary | ICD-10-CM | POA: Diagnosis not present

## 2019-02-18 DIAGNOSIS — R918 Other nonspecific abnormal finding of lung field: Secondary | ICD-10-CM | POA: Diagnosis not present

## 2019-02-18 DIAGNOSIS — I8500 Esophageal varices without bleeding: Secondary | ICD-10-CM | POA: Diagnosis not present

## 2019-02-18 DIAGNOSIS — E8809 Other disorders of plasma-protein metabolism, not elsewhere classified: Secondary | ICD-10-CM | POA: Diagnosis not present

## 2019-02-18 DIAGNOSIS — S065X0A Traumatic subdural hemorrhage without loss of consciousness, initial encounter: Secondary | ICD-10-CM | POA: Diagnosis not present

## 2019-02-18 DIAGNOSIS — K7291 Hepatic failure, unspecified with coma: Secondary | ICD-10-CM | POA: Diagnosis not present

## 2019-02-18 DIAGNOSIS — N179 Acute kidney failure, unspecified: Secondary | ICD-10-CM | POA: Diagnosis not present

## 2019-02-18 DIAGNOSIS — S065X9A Traumatic subdural hemorrhage with loss of consciousness of unspecified duration, initial encounter: Secondary | ICD-10-CM | POA: Diagnosis not present

## 2019-02-18 DIAGNOSIS — J9601 Acute respiratory failure with hypoxia: Secondary | ICD-10-CM | POA: Diagnosis not present

## 2019-02-18 DIAGNOSIS — J810 Acute pulmonary edema: Secondary | ICD-10-CM | POA: Diagnosis not present

## 2019-02-18 DIAGNOSIS — E869 Volume depletion, unspecified: Secondary | ICD-10-CM | POA: Diagnosis not present

## 2019-02-18 DIAGNOSIS — R42 Dizziness and giddiness: Secondary | ICD-10-CM | POA: Diagnosis not present

## 2019-02-18 DIAGNOSIS — G9340 Encephalopathy, unspecified: Secondary | ICD-10-CM | POA: Diagnosis not present

## 2019-02-18 MED ORDER — LORAZEPAM 1 MG PO TABS
2.00 | ORAL_TABLET | ORAL | Status: DC
Start: ? — End: 2019-02-18

## 2019-02-18 MED ORDER — ACETAMINOPHEN 325 MG PO TABS
325.00 | ORAL_TABLET | ORAL | Status: DC
Start: ? — End: 2019-02-18

## 2019-02-18 MED ORDER — FOLIC ACID 1 MG PO TABS
1.00 | ORAL_TABLET | ORAL | Status: DC
Start: 2019-02-18 — End: 2019-02-18

## 2019-02-18 MED ORDER — GENERIC EXTERNAL MEDICATION
1.00 | Status: DC
Start: ? — End: 2019-02-18

## 2019-02-18 MED ORDER — GENERIC EXTERNAL MEDICATION
100.00 | Status: DC
Start: 2019-02-18 — End: 2019-02-18

## 2019-02-18 MED ORDER — LACTULOSE 10 GM/15ML PO SOLN
20.00 | ORAL | Status: DC
Start: 2019-02-18 — End: 2019-02-18

## 2019-02-18 MED ORDER — GENERIC EXTERNAL MEDICATION
60.00 | Status: DC
Start: 2019-02-18 — End: 2019-02-18

## 2019-02-18 MED ORDER — PANTOPRAZOLE SODIUM 40 MG PO TBEC
40.00 | DELAYED_RELEASE_TABLET | ORAL | Status: DC
Start: 2019-02-18 — End: 2019-02-18

## 2019-02-18 MED ORDER — OXYCODONE HCL 5 MG PO TABS
2.50 | ORAL_TABLET | ORAL | Status: DC
Start: ? — End: 2019-02-18

## 2019-02-18 MED ORDER — ALBUMIN HUMAN 25 % IV SOLN
50.00 | INTRAVENOUS | Status: DC
Start: 2019-02-18 — End: 2019-02-18

## 2019-02-19 DIAGNOSIS — R6521 Severe sepsis with septic shock: Secondary | ICD-10-CM | POA: Diagnosis not present

## 2019-02-19 DIAGNOSIS — J9601 Acute respiratory failure with hypoxia: Secondary | ICD-10-CM | POA: Diagnosis not present

## 2019-02-19 DIAGNOSIS — I517 Cardiomegaly: Secondary | ICD-10-CM | POA: Diagnosis not present

## 2019-02-19 DIAGNOSIS — J80 Acute respiratory distress syndrome: Secondary | ICD-10-CM | POA: Diagnosis not present

## 2019-02-19 DIAGNOSIS — J810 Acute pulmonary edema: Secondary | ICD-10-CM | POA: Diagnosis not present

## 2019-02-19 DIAGNOSIS — G9340 Encephalopathy, unspecified: Secondary | ICD-10-CM | POA: Diagnosis not present

## 2019-02-19 DIAGNOSIS — N179 Acute kidney failure, unspecified: Secondary | ICD-10-CM | POA: Diagnosis not present

## 2019-02-19 DIAGNOSIS — I361 Nonrheumatic tricuspid (valve) insufficiency: Secondary | ICD-10-CM | POA: Diagnosis not present

## 2019-02-19 DIAGNOSIS — I6201 Nontraumatic acute subdural hemorrhage: Secondary | ICD-10-CM | POA: Diagnosis not present

## 2019-02-19 DIAGNOSIS — Z9911 Dependence on respirator [ventilator] status: Secondary | ICD-10-CM | POA: Diagnosis not present

## 2019-02-19 DIAGNOSIS — A419 Sepsis, unspecified organism: Secondary | ICD-10-CM | POA: Diagnosis not present

## 2019-02-20 DIAGNOSIS — J9691 Respiratory failure, unspecified with hypoxia: Secondary | ICD-10-CM | POA: Diagnosis not present

## 2019-02-20 DIAGNOSIS — A419 Sepsis, unspecified organism: Secondary | ICD-10-CM | POA: Diagnosis not present

## 2019-02-20 DIAGNOSIS — J80 Acute respiratory distress syndrome: Secondary | ICD-10-CM | POA: Diagnosis not present

## 2019-02-20 DIAGNOSIS — Z9911 Dependence on respirator [ventilator] status: Secondary | ICD-10-CM | POA: Diagnosis not present

## 2019-02-20 DIAGNOSIS — R6521 Severe sepsis with septic shock: Secondary | ICD-10-CM | POA: Diagnosis not present

## 2019-02-20 DIAGNOSIS — I6201 Nontraumatic acute subdural hemorrhage: Secondary | ICD-10-CM | POA: Diagnosis not present

## 2019-02-20 DIAGNOSIS — N179 Acute kidney failure, unspecified: Secondary | ICD-10-CM | POA: Diagnosis not present

## 2019-02-21 DIAGNOSIS — A419 Sepsis, unspecified organism: Secondary | ICD-10-CM | POA: Diagnosis not present

## 2019-02-21 DIAGNOSIS — I6201 Nontraumatic acute subdural hemorrhage: Secondary | ICD-10-CM | POA: Diagnosis not present

## 2019-02-21 DIAGNOSIS — R6521 Severe sepsis with septic shock: Secondary | ICD-10-CM | POA: Diagnosis not present

## 2019-02-21 DIAGNOSIS — J9601 Acute respiratory failure with hypoxia: Secondary | ICD-10-CM | POA: Diagnosis not present

## 2019-02-21 DIAGNOSIS — N179 Acute kidney failure, unspecified: Secondary | ICD-10-CM | POA: Diagnosis not present

## 2019-02-21 DIAGNOSIS — J984 Other disorders of lung: Secondary | ICD-10-CM | POA: Diagnosis not present

## 2019-02-21 DIAGNOSIS — Z9911 Dependence on respirator [ventilator] status: Secondary | ICD-10-CM | POA: Diagnosis not present

## 2019-02-21 DIAGNOSIS — J80 Acute respiratory distress syndrome: Secondary | ICD-10-CM | POA: Diagnosis not present

## 2019-02-21 DIAGNOSIS — J90 Pleural effusion, not elsewhere classified: Secondary | ICD-10-CM | POA: Diagnosis not present

## 2019-02-22 DIAGNOSIS — E870 Hyperosmolality and hypernatremia: Secondary | ICD-10-CM | POA: Diagnosis not present

## 2019-02-22 DIAGNOSIS — E876 Hypokalemia: Secondary | ICD-10-CM | POA: Diagnosis not present

## 2019-02-22 DIAGNOSIS — J9601 Acute respiratory failure with hypoxia: Secondary | ICD-10-CM | POA: Diagnosis not present

## 2019-02-22 DIAGNOSIS — G9341 Metabolic encephalopathy: Secondary | ICD-10-CM | POA: Diagnosis not present

## 2019-02-22 DIAGNOSIS — E8770 Fluid overload, unspecified: Secondary | ICD-10-CM | POA: Diagnosis not present

## 2019-02-22 DIAGNOSIS — N179 Acute kidney failure, unspecified: Secondary | ICD-10-CM | POA: Diagnosis not present

## 2019-02-23 DIAGNOSIS — N179 Acute kidney failure, unspecified: Secondary | ICD-10-CM | POA: Diagnosis not present

## 2019-02-23 DIAGNOSIS — J9601 Acute respiratory failure with hypoxia: Secondary | ICD-10-CM | POA: Diagnosis not present

## 2019-02-23 DIAGNOSIS — G9341 Metabolic encephalopathy: Secondary | ICD-10-CM | POA: Diagnosis not present

## 2019-02-23 DIAGNOSIS — E8770 Fluid overload, unspecified: Secondary | ICD-10-CM | POA: Diagnosis not present

## 2019-02-23 DIAGNOSIS — E876 Hypokalemia: Secondary | ICD-10-CM | POA: Diagnosis not present

## 2019-02-23 DIAGNOSIS — E870 Hyperosmolality and hypernatremia: Secondary | ICD-10-CM | POA: Diagnosis not present

## 2019-02-24 DIAGNOSIS — E876 Hypokalemia: Secondary | ICD-10-CM | POA: Diagnosis not present

## 2019-02-24 DIAGNOSIS — J9601 Acute respiratory failure with hypoxia: Secondary | ICD-10-CM | POA: Diagnosis not present

## 2019-02-24 DIAGNOSIS — G9341 Metabolic encephalopathy: Secondary | ICD-10-CM | POA: Diagnosis not present

## 2019-02-24 DIAGNOSIS — Z4682 Encounter for fitting and adjustment of non-vascular catheter: Secondary | ICD-10-CM | POA: Diagnosis not present

## 2019-02-24 DIAGNOSIS — N179 Acute kidney failure, unspecified: Secondary | ICD-10-CM | POA: Diagnosis not present

## 2019-02-24 DIAGNOSIS — Z9911 Dependence on respirator [ventilator] status: Secondary | ICD-10-CM | POA: Diagnosis not present

## 2019-02-24 DIAGNOSIS — E870 Hyperosmolality and hypernatremia: Secondary | ICD-10-CM | POA: Diagnosis not present

## 2019-02-24 DIAGNOSIS — E8770 Fluid overload, unspecified: Secondary | ICD-10-CM | POA: Diagnosis not present

## 2019-02-25 DIAGNOSIS — E871 Hypo-osmolality and hyponatremia: Secondary | ICD-10-CM | POA: Diagnosis not present

## 2019-02-25 DIAGNOSIS — E8770 Fluid overload, unspecified: Secondary | ICD-10-CM | POA: Diagnosis not present

## 2019-02-25 DIAGNOSIS — N179 Acute kidney failure, unspecified: Secondary | ICD-10-CM | POA: Diagnosis not present

## 2019-02-25 DIAGNOSIS — J810 Acute pulmonary edema: Secondary | ICD-10-CM | POA: Diagnosis not present

## 2019-02-25 DIAGNOSIS — G9341 Metabolic encephalopathy: Secondary | ICD-10-CM | POA: Diagnosis not present

## 2019-02-25 DIAGNOSIS — I959 Hypotension, unspecified: Secondary | ICD-10-CM | POA: Diagnosis not present

## 2019-02-25 DIAGNOSIS — E872 Acidosis: Secondary | ICD-10-CM | POA: Diagnosis not present

## 2019-02-25 DIAGNOSIS — J9601 Acute respiratory failure with hypoxia: Secondary | ICD-10-CM | POA: Diagnosis not present

## 2019-02-25 DIAGNOSIS — G9340 Encephalopathy, unspecified: Secondary | ICD-10-CM | POA: Diagnosis not present

## 2019-02-26 DIAGNOSIS — J9601 Acute respiratory failure with hypoxia: Secondary | ICD-10-CM | POA: Diagnosis not present

## 2019-02-26 DIAGNOSIS — G9340 Encephalopathy, unspecified: Secondary | ICD-10-CM | POA: Diagnosis not present

## 2019-02-26 DIAGNOSIS — J810 Acute pulmonary edema: Secondary | ICD-10-CM | POA: Diagnosis not present

## 2019-02-26 DIAGNOSIS — I959 Hypotension, unspecified: Secondary | ICD-10-CM | POA: Diagnosis not present

## 2019-02-26 MED ORDER — BISACODYL 10 MG RE SUPP
10.00 | RECTAL | Status: DC
Start: ? — End: 2019-02-26

## 2019-02-26 MED ORDER — GLUCOSE 40 % PO GEL
15.00 | ORAL | Status: DC
Start: ? — End: 2019-02-26

## 2019-02-26 MED ORDER — GLYCOPYRROLATE 0.2 MG/ML IJ SOLN
0.10 | INTRAMUSCULAR | Status: DC
Start: ? — End: 2019-02-26

## 2019-02-26 MED ORDER — LORAZEPAM 2 MG/ML IJ SOLN
1.00 | INTRAMUSCULAR | Status: DC
Start: ? — End: 2019-02-26

## 2019-02-26 MED ORDER — GENERIC EXTERNAL MEDICATION
20.00 | Status: DC
Start: ? — End: 2019-02-26

## 2019-02-26 MED ORDER — FENTANYL CITRATE (PF) 50 MCG/ML IJ SOLN
25.00 | INTRAMUSCULAR | Status: DC
Start: ? — End: 2019-02-26

## 2019-02-26 MED ORDER — FENTANYL CITRATE (PF) 50 MCG/ML IJ SOLN
0.00 | INTRAMUSCULAR | Status: DC
Start: ? — End: 2019-02-26

## 2019-02-26 MED ORDER — LORAZEPAM 2 MG/ML IJ SOLN
2.00 | INTRAMUSCULAR | Status: DC
Start: ? — End: 2019-02-26

## 2019-02-26 MED ORDER — CARBOXYMETHYLCELLULOSE SODIUM 0.5 % OP SOLN
1.00 | OPHTHALMIC | Status: DC
Start: ? — End: 2019-02-26

## 2019-02-26 MED ORDER — POLYETHYLENE GLYCOL 3350 17 G PO PACK
17.00 | PACK | ORAL | Status: DC
Start: ? — End: 2019-02-26

## 2019-02-26 MED ORDER — HYDRALAZINE HCL 20 MG/ML IJ SOLN
10.00 | INTRAMUSCULAR | Status: DC
Start: ? — End: 2019-02-26

## 2019-02-26 MED ORDER — DEXTROSE 10 % IV SOLN
125.00 | INTRAVENOUS | Status: DC
Start: ? — End: 2019-02-26

## 2019-03-29 DEATH — deceased

## 2022-07-10 ENCOUNTER — Encounter (INDEPENDENT_AMBULATORY_CARE_PROVIDER_SITE_OTHER): Payer: Self-pay | Admitting: Gastroenterology
